# Patient Record
Sex: Female | Born: 1970 | Race: Black or African American | Hispanic: No | Smoking: Never smoker
Health system: Southern US, Community
[De-identification: ages and names within clinical notes are randomized; demographics above are authoritative.]

## PROBLEM LIST (undated history)

## (undated) DIAGNOSIS — E669 Obesity, unspecified: Secondary | ICD-10-CM

## (undated) DIAGNOSIS — D649 Anemia, unspecified: Secondary | ICD-10-CM

## (undated) DIAGNOSIS — I671 Cerebral aneurysm, nonruptured: Secondary | ICD-10-CM

## (undated) DIAGNOSIS — E119 Type 2 diabetes mellitus without complications: Secondary | ICD-10-CM

## (undated) DIAGNOSIS — R0600 Dyspnea, unspecified: Secondary | ICD-10-CM

## (undated) DIAGNOSIS — I10 Essential (primary) hypertension: Secondary | ICD-10-CM

## (undated) DIAGNOSIS — G459 Transient cerebral ischemic attack, unspecified: Secondary | ICD-10-CM

## (undated) HISTORY — PX: FOOT SURGERY: SHX648

## (undated) HISTORY — DX: Anemia, unspecified: D64.9

## (undated) HISTORY — PX: ABDOMINAL HYSTERECTOMY: SHX81

## (undated) HISTORY — PX: TUBAL LIGATION: SHX77

---

## 2009-02-15 ENCOUNTER — Emergency Department (HOSPITAL_COMMUNITY): Admission: EM | Admit: 2009-02-15 | Discharge: 2009-02-15 | Payer: Self-pay | Admitting: Emergency Medicine

## 2009-03-15 ENCOUNTER — Emergency Department (HOSPITAL_COMMUNITY): Admission: EM | Admit: 2009-03-15 | Discharge: 2009-03-15 | Payer: Self-pay | Admitting: Emergency Medicine

## 2009-05-19 ENCOUNTER — Emergency Department (HOSPITAL_COMMUNITY): Admission: EM | Admit: 2009-05-19 | Discharge: 2009-05-19 | Payer: Self-pay | Admitting: Emergency Medicine

## 2009-09-17 ENCOUNTER — Emergency Department (HOSPITAL_COMMUNITY): Admission: EM | Admit: 2009-09-17 | Discharge: 2009-09-18 | Payer: Self-pay | Admitting: Emergency Medicine

## 2010-01-24 ENCOUNTER — Emergency Department (HOSPITAL_COMMUNITY): Admission: EM | Admit: 2010-01-24 | Discharge: 2010-01-24 | Payer: Self-pay | Admitting: Emergency Medicine

## 2010-06-25 ENCOUNTER — Emergency Department (HOSPITAL_COMMUNITY): Admission: EM | Admit: 2010-06-25 | Discharge: 2010-06-25 | Payer: Self-pay | Admitting: Emergency Medicine

## 2010-08-23 ENCOUNTER — Ambulatory Visit (HOSPITAL_BASED_OUTPATIENT_CLINIC_OR_DEPARTMENT_OTHER): Admission: RE | Admit: 2010-08-23 | Discharge: 2010-08-24 | Payer: Self-pay | Admitting: Specialist

## 2011-02-17 LAB — DIFFERENTIAL
Basophils Absolute: 0 10*3/uL (ref 0.0–0.1)
Eosinophils Relative: 6 % — ABNORMAL HIGH (ref 0–5)
Lymphocytes Relative: 36 % (ref 12–46)
Lymphs Abs: 1.8 10*3/uL (ref 0.7–4.0)
Monocytes Relative: 10 % (ref 3–12)
Neutro Abs: 2.5 10*3/uL (ref 1.7–7.7)

## 2011-02-17 LAB — BASIC METABOLIC PANEL
BUN: 6 mg/dL (ref 6–23)
Chloride: 108 mEq/L (ref 96–112)
GFR calc Af Amer: >60 mL/min (ref >60)
GFR calc non Af Amer: >60 mL/min (ref >60)
Potassium: 3.6 mEq/L (ref 3.5–5.1)
Sodium: 140 mEq/L (ref 135–145)

## 2011-02-17 LAB — CBC
HCT: 32.6 % — ABNORMAL LOW (ref 36.0–46.0)
Hemoglobin: 10.2 g/dL — ABNORMAL LOW (ref 12.0–15.0)
MCV: 70.1 fL — ABNORMAL LOW (ref 78.0–100.0)
RBC: 4.65 MIL/uL (ref 3.87–5.11)
RDW: 14.8 % (ref 11.5–15.5)
WBC: 5.1 10*3/uL (ref 4.0–10.5)

## 2011-03-10 LAB — URINALYSIS, ROUTINE W REFLEX MICROSCOPIC
Glucose, UA: NEGATIVE mg/dL
Protein, ur: NEGATIVE mg/dL
Specific Gravity, Urine: 1.026 (ref 1.005–1.030)
Urobilinogen, UA: 1 mg/dL (ref 0.0–1.0)

## 2011-03-10 LAB — POCT I-STAT, CHEM 8
BUN: 10 mg/dL (ref 6–23)
Calcium, Ion: 0.96 mmol/L — ABNORMAL LOW (ref 1.12–1.32)
Chloride: 108 mEq/L (ref 96–112)
Glucose, Bld: 106 mg/dL — ABNORMAL HIGH (ref 70–99)
TCO2: 24 mmol/L (ref 0–100)

## 2011-03-10 LAB — URINE MICROSCOPIC-ADD ON

## 2011-03-17 LAB — POCT I-STAT, CHEM 8
BUN: 6 mg/dL (ref 6–23)
Calcium, Ion: 1.17 mmol/L (ref 1.12–1.32)
HCT: 38 % (ref 36.0–46.0)
Hemoglobin: 12.9 g/dL (ref 12.0–15.0)
TCO2: 26 mmol/L (ref 0–100)

## 2011-03-17 LAB — URINALYSIS, ROUTINE W REFLEX MICROSCOPIC
Glucose, UA: NEGATIVE mg/dL
Ketones, ur: NEGATIVE mg/dL
Protein, ur: NEGATIVE mg/dL

## 2011-03-17 LAB — DIFFERENTIAL
Basophils Absolute: 0 10*3/uL (ref 0.0–0.1)
Basophils Relative: 1 % (ref 0–1)
Eosinophils Absolute: 0.3 10*3/uL (ref 0.0–0.7)
Eosinophils Relative: 4 % (ref 0–5)
Monocytes Absolute: 0.6 10*3/uL (ref 0.1–1.0)

## 2011-03-17 LAB — CBC
HCT: 35 % — ABNORMAL LOW (ref 36.0–46.0)
Hemoglobin: 11.3 g/dL — ABNORMAL LOW (ref 12.0–15.0)
MCHC: 32.2 g/dL (ref 30.0–36.0)
RDW: 15 % (ref 11.5–15.5)

## 2011-08-06 HISTORY — PX: BREAST SURGERY: SHX581

## 2012-02-08 ENCOUNTER — Emergency Department (HOSPITAL_COMMUNITY)
Admission: EM | Admit: 2012-02-08 | Discharge: 2012-02-09 | Disposition: A | Discharge status: Home / Self Care | Payer: PRIVATE HEALTH INSURANCE | Attending: Emergency Medicine | Admitting: Emergency Medicine

## 2012-02-08 ENCOUNTER — Emergency Department (HOSPITAL_COMMUNITY): Payer: PRIVATE HEALTH INSURANCE

## 2012-02-08 ENCOUNTER — Encounter (HOSPITAL_COMMUNITY): Payer: Self-pay | Admitting: Emergency Medicine

## 2012-02-08 DIAGNOSIS — J3489 Other specified disorders of nose and nasal sinuses: Secondary | ICD-10-CM | POA: Insufficient documentation

## 2012-02-08 DIAGNOSIS — R059 Cough, unspecified: Secondary | ICD-10-CM | POA: Insufficient documentation

## 2012-02-08 DIAGNOSIS — IMO0001 Reserved for inherently not codable concepts without codable children: Secondary | ICD-10-CM | POA: Insufficient documentation

## 2012-02-08 DIAGNOSIS — M2550 Pain in unspecified joint: Secondary | ICD-10-CM | POA: Insufficient documentation

## 2012-02-08 DIAGNOSIS — R5381 Other malaise: Secondary | ICD-10-CM | POA: Insufficient documentation

## 2012-02-08 DIAGNOSIS — R630 Anorexia: Secondary | ICD-10-CM | POA: Insufficient documentation

## 2012-02-08 DIAGNOSIS — R599 Enlarged lymph nodes, unspecified: Secondary | ICD-10-CM | POA: Insufficient documentation

## 2012-02-08 DIAGNOSIS — I10 Essential (primary) hypertension: Secondary | ICD-10-CM | POA: Insufficient documentation

## 2012-02-08 DIAGNOSIS — R509 Fever, unspecified: Secondary | ICD-10-CM | POA: Insufficient documentation

## 2012-02-08 DIAGNOSIS — R05 Cough: Secondary | ICD-10-CM | POA: Insufficient documentation

## 2012-02-08 DIAGNOSIS — R070 Pain in throat: Secondary | ICD-10-CM | POA: Insufficient documentation

## 2012-02-08 DIAGNOSIS — R109 Unspecified abdominal pain: Secondary | ICD-10-CM | POA: Insufficient documentation

## 2012-02-08 DIAGNOSIS — J111 Influenza due to unidentified influenza virus with other respiratory manifestations: Secondary | ICD-10-CM | POA: Insufficient documentation

## 2012-02-08 HISTORY — DX: Essential (primary) hypertension: I10

## 2012-02-08 LAB — MONONUCLEOSIS SCREEN: Mono Screen: NEGATIVE

## 2012-02-08 LAB — DIFFERENTIAL
Basophils Relative: 0 % (ref 0–1)
Eosinophils Relative: 1 % (ref 0–5)
Lymphs Abs: 0.9 10*3/uL (ref 0.7–4.0)
Monocytes Relative: 17 % — ABNORMAL HIGH (ref 3–12)
Neutro Abs: 2.1 10*3/uL (ref 1.7–7.7)

## 2012-02-08 LAB — URINALYSIS, ROUTINE W REFLEX MICROSCOPIC
Bilirubin Urine: NEGATIVE
Hgb urine dipstick: NEGATIVE
Specific Gravity, Urine: 1.015 (ref 1.005–1.030)
Urobilinogen, UA: 1 mg/dL (ref 0.0–1.0)

## 2012-02-08 LAB — CBC
HCT: 28.1 % — ABNORMAL LOW (ref 36.0–46.0)
MCV: 64 fL — ABNORMAL LOW (ref 78.0–100.0)
RBC: 4.39 MIL/uL (ref 3.87–5.11)
RDW: 17.1 % — ABNORMAL HIGH (ref 11.5–15.5)
WBC: 3.6 10*3/uL — ABNORMAL LOW (ref 4.0–10.5)

## 2012-02-08 LAB — BASIC METABOLIC PANEL
CO2: 24 mEq/L (ref 19–32)
Chloride: 105 mEq/L (ref 96–112)
Glucose, Bld: 103 mg/dL — ABNORMAL HIGH (ref 70–99)
Potassium: 3.2 mEq/L — ABNORMAL LOW (ref 3.5–5.1)
Sodium: 138 mEq/L (ref 135–145)

## 2012-02-08 MED ORDER — SODIUM CHLORIDE 0.9 % IV BOLUS (SEPSIS)
1000.0000 mL | Freq: Once | INTRAVENOUS | Status: AC
  Administered 2012-02-08: 1000 mL via INTRAVENOUS

## 2012-02-08 MED ORDER — ONDANSETRON 4 MG PO TBDP
4.0000 mg | ORAL_TABLET | Freq: Once | ORAL | Status: AC
  Administered 2012-02-08: 4 mg via ORAL
  Filled 2012-02-08: qty 1

## 2012-02-08 MED ORDER — OSELTAMIVIR PHOSPHATE 75 MG PO CAPS: 75.0000 mg | ORAL_CAPSULE | Freq: Two times a day (BID) | ORAL | Status: AC

## 2012-02-08 MED ORDER — ONDANSETRON 4 MG PO TBDP
ORAL_TABLET | ORAL | Status: AC
  Administered 2012-02-08: 8 mg via ORAL
  Filled 2012-02-08: qty 2

## 2012-02-08 MED ORDER — ALBUTEROL SULFATE HFA 108 (90 BASE) MCG/ACT IN AERS
2.0000 | INHALATION_SPRAY | RESPIRATORY_TRACT | Status: DC | PRN
  Administered 2012-02-08: 2 via RESPIRATORY_TRACT
  Filled 2012-02-08: qty 6.7

## 2012-02-08 MED ORDER — HYDROMORPHONE HCL PF 1 MG/ML IJ SOLN
1.0000 mg | Freq: Once | INTRAMUSCULAR | Status: AC
  Administered 2012-02-08: 1 mg via INTRAVENOUS
  Filled 2012-02-08: qty 1

## 2012-02-08 MED ORDER — PROMETHAZINE HCL 25 MG/ML IJ SOLN
25.0000 mg | INTRAMUSCULAR | Status: AC
  Administered 2012-02-09: 25 mg via INTRAMUSCULAR
  Filled 2012-02-08: qty 1

## 2012-02-08 MED ORDER — ONDANSETRON 4 MG PO TBDP
8.0000 mg | ORAL_TABLET | Freq: Once | ORAL | Status: AC
  Administered 2012-02-08: 8 mg via ORAL

## 2012-02-08 MED ORDER — ONDANSETRON HCL 4 MG/2ML IJ SOLN
4.0000 mg | Freq: Once | INTRAMUSCULAR | Status: AC
  Administered 2012-02-08: 4 mg via INTRAVENOUS
  Filled 2012-02-08: qty 2

## 2012-02-08 MED ORDER — OXYCODONE-ACETAMINOPHEN 5-325 MG PO TABS: 1.0000 | ORAL_TABLET | ORAL | Status: AC | PRN

## 2012-02-08 MED ORDER — OXYCODONE-ACETAMINOPHEN 5-325 MG PO TABS
2.0000 | ORAL_TABLET | Freq: Once | ORAL | Status: AC
  Administered 2012-02-08: 2 via ORAL
  Filled 2012-02-08: qty 2

## 2012-02-08 NOTE — ED Notes (Signed)
Patient states onset two days ago nasal and chest congestion light green sputum with general body achy and left lower back pain. Pain currently 10/10 achy.  Airway intact bilateral equal chest rise and fall.  Ax4 family member at bedside.

## 2012-02-08 NOTE — ED Notes (Signed)
Patient continues to c/o nausea and feeling sick

## 2012-02-08 NOTE — ED Provider Notes (Signed)
History     CSN: [id number]  Arrival date & time 02/08/12  1654   First MD Initiated Contact with Patient 02/08/12 1810      Chief Complaint  Patient presents with  . Flank Pain    (Consider location/radiation/quality/duration/timing/severity/associated sxs/prior treatment) HPI Comments: Patient here with a two day history starting with left flank pain - states that this has continued but she now has fever, chills, nasal congestion, cough with green sputum production, body aches, sore throat.  Denies abdominal pain but reports decrease in appetite.  Patient is a 41 y.o. female presenting with flank pain. The history is provided by the patient. No language interpreter was used.  Flank Pain This is a new problem. The current episode started in the past 7 days. The problem occurs constantly. The problem has been unchanged. Associated symptoms include anorexia, arthralgias, chills, congestion, coughing, fatigue, a fever, myalgias and swollen glands. Pertinent negatives include no abdominal pain, change in bowel habit, chest pain, diaphoresis, headaches, joint swelling, nausea, neck pain, numbness, rash, sore throat, urinary symptoms, vertigo, visual change, vomiting or weakness. The symptoms are aggravated by nothing. She has tried nothing for the symptoms. The treatment provided no relief.    Past Medical History  Diagnosis Date  . Hypertension     Past Surgical History  Procedure Date  . Foot surgery     History reviewed. No pertinent family history.  History  Substance Use Topics  . Smoking status: Never Smoker   . Smokeless tobacco: Not on file  . Alcohol Use: No    OB History    Grav Para Term Preterm Abortions TAB SAB Ect Mult Living                  Review of Systems  Constitutional: Positive for fever, chills and fatigue. Negative for diaphoresis.  HENT: Positive for congestion. Negative for sore throat and neck pain.   Respiratory: Positive for cough.     Cardiovascular: Negative for chest pain.  Gastrointestinal: Positive for anorexia. Negative for nausea, vomiting, abdominal pain and change in bowel habit.  Genitourinary: Positive for flank pain.  Musculoskeletal: Positive for myalgias and arthralgias. Negative for joint swelling.  Skin: Negative for rash.  Neurological: Negative for vertigo, weakness, numbness and headaches.  All other systems reviewed and are negative.    Allergies  Review of patient's allergies indicates no known allergies.  Home Medications  No current outpatient prescriptions on file.  BP 133/83  Pulse 82  Temp(Src) 99.7 F (37.6 C) (Oral)  Resp 18  SpO2 97%  LMP 02/01/2012  Physical Exam  Nursing note and vitals reviewed. Constitutional: She is oriented to person, place, and time. She appears well-developed and well-nourished. She appears distressed.  HENT:  Head: Normocephalic and atraumatic.  Right Ear: External ear normal.  Left Ear: External ear normal.  Mouth/Throat: No oropharyngeal exudate.       Nasal congestion and boggy membranes  Eyes: Conjunctivae are normal. Pupils are equal, round, and reactive to light. No scleral icterus.  Neck: Normal range of motion. Neck supple.  Cardiovascular: Normal rate, regular rhythm and normal heart sounds.  Exam reveals no gallop and no friction rub.   No murmur heard. Pulmonary/Chest: Effort normal and breath sounds normal. No respiratory distress. She has no wheezes. She has no rales. She exhibits no tenderness.  Abdominal: Soft. Bowel sounds are normal. She exhibits no distension and no mass. There is no tenderness. There is no rebound, no guarding and no  CVA tenderness.  Musculoskeletal: Normal range of motion. She exhibits no edema and no tenderness.  Lymphadenopathy:    She has no cervical adenopathy.  Neurological: She is alert and oriented to person, place, and time. No cranial nerve deficit.  Skin: Skin is warm and dry. No rash noted. No  erythema. No pallor.  Psychiatric: She has a normal mood and affect. Her behavior is normal. Judgment and thought content normal.    ED Course  Procedures (including critical care time)  Labs Reviewed  CBC - Abnormal; Notable for the following:    WBC 3.6 (*)    Hemoglobin 8.7 (*)    HCT 28.1 (*)    MCV 64.0 (*)    MCH 19.8 (*)    RDW 17.1 (*)    All other components within normal limits  DIFFERENTIAL - Abnormal; Notable for the following:    Monocytes Relative 17 (*)    All other components within normal limits  BASIC METABOLIC PANEL - Abnormal; Notable for the following:    Potassium 3.2 (*)    Glucose, Bld 103 (*)    All other components within normal limits  URINALYSIS, ROUTINE W REFLEX MICROSCOPIC  POCT PREGNANCY, URINE   Dg Chest 2 View  02/08/2012  *RADIOLOGY REPORT*  Clinical Data: Cough, chest congestion, and left-sided chest pain.  CHEST - 2 VIEW  Comparison: 03/15/2009  Findings: Heart size and vascularity are normal and the lungs are clear of infiltrates and effusions.  Slight peribronchial thickening seen on the lateral view consistent with bronchitis.  No acute osseous abnormality.  IMPRESSION: Mild bronchitic changes.  Original Report Authenticated By: Gwynn Burly, M.D.     Influenza    MDM  Patient here with influenza like symptoms that started about 2 days ago - reports fever, chills, cough, chest congestion.  No bacterial appearance to this.        Izola Price [place], [place] 02/08/12 2146

## 2012-02-08 NOTE — ED Notes (Signed)
Patient c/o feeling sick and nauseated.

## 2012-02-08 NOTE — ED Notes (Addendum)
Patient complaining of left flank pain, chest congestion, hoarse voice, sore throat, body aches, cold chills, and fever since Sunday.  Patient denies past history of kidney stones; patient reports urinary frequency and hematuria.

## 2012-02-08 NOTE — Discharge Instructions (Signed)
Influenza, Adult Influenza ("the flu") is a viral infection of the respiratory tract. It causes chills, fever, cough, headache, body aches, and sore throat. Influenza in general will make you feel sicker than when you have a common cold. Symptoms of the illness typically last a few days. Cough and fatigue may continue for as long as 7 to 10 days. Influenza is highly contagious. It spreads easily to others in the droplets from coughs and sneezes. People frequently become infected by touching something that was recently contaminated with the virus and then touch their mouth, nose or eyes. This infection is caused by a virus. Symptoms will not be reduced or improved by taking an antibiotic. Antibiotics are medications that kill bacteria, not viruses. DIAGNOSIS  Diagnosis of influenza is often made based on the history and physical examination as well as the presence of influenza reports occurring in your community. Testing can be done if the diagnosis is not certain. TREATMENT  Since influenza is caused by a virus, antibiotics are not helpful. Your caregiver may prescribe antiviral medicines to shorten the illness and lessen the severity. Your caregiver may also recommend influenza vaccination and/or antiviral medicines for your family members in order to prevent the spread of influenza to them. HOME CARE INSTRUCTIONS  DO NOT GIVE ASPIRIN TO PERSONS WITH INFLUENZA WHO ARE UNDER 45 YEARS OF AGE. This could lead to brain and liver damage (Reye's syndrome). Read the label on over-the-counter medicines.   Stay home from work or school if at all possible until most of your symptoms are gone.   Only take over-the-counter or prescription medicines for pain, discomfort, or fever as directed by your caregiver.   Use a cool mist humidifier to increase air moisture. This will make breathing easier.   Rest until your temperature is nearly normal: 98.6 F (37 C). This usually takes 3 to 4 days. Be sure you get  plenty of rest.   Drink at least eight, eight-ounce glasses of fluids per day. Fluids include water, juice, broth, gelatin, or lemonade.   Cover your mouth and nose when coughing or sneezing and wash your hands often to prevent the spread of this virus to other persons.  PREVENTION  Annual influenza vaccination (flu shots) is the best way to avoid getting influenza. An annual flu shot is now routinely recommended for all adults in the [place] SEEK MEDICAL CARE IF:   You develop shortness of breath while resting.   You have a deep cough with production of mucous or chest pain.   You develop nausea (feeling sick to your stomach), vomiting, or diarrhea.  SEEK IMMEDIATE MEDICAL CARE IF:   You have difficulty breathing, become short of breath, or your skin or nails turn bluish.   You develop severe neck pain or stiffness.   You develop a severe headache, facial pain, or earache.   You have a fever.   You develop nausea or vomiting that cannot be controlled.  Document Released: 11/18/2000 Document Revised: 11/10/2011 Document Reviewed: 09/23/2009 [facility] Patient Information 2012 [place], [place].Influenza Facts Flu (influenza) is a contagious respiratory illness caused by the influenza viruses. It can cause mild to severe illness. While most healthy people recover from the flu without specific treatment and without complications, older people, young children, and people with certain health conditions are at higher risk for serious complications from the flu, including death. CAUSES   The flu virus is spread from person to person by respiratory droplets from coughing and sneezing.   A person  can also become infected by touching an object or surface with a virus on it and then touching their mouth, eye or nose.   Adults may be able to infect others from 1 day before symptoms occur and up to 7 days after getting sick. So it is possible to give someone the flu even before you know you are sick  and continue to infect others while you are sick.  SYMPTOMS   Fever (usually high).   Headache.   Tiredness (can be extreme).   Cough.   Sore throat.   Runny or stuffy nose.   Body aches.   Diarrhea and vomiting may also occur, particularly in children.   These symptoms are referred to as "flu-like symptoms". A lot of different illnesses, including the common cold, can have similar symptoms.  DIAGNOSIS   There are tests that can determine if you have the flu as long you are tested within the first 2 or 3 days of illness.   A doctor's exam and additional tests may be needed to identify if you have a disease that is a complicating the flu.  RISKS AND COMPLICATIONS  Some of the complications caused by the flu include:  Bacterial pneumonia or progressive pneumonia caused by the flu virus.   Loss of body fluids (dehydration).   Worsening of chronic medical conditions, such as heart failure, asthma, or diabetes.   Sinus problems and ear infections.  HOME CARE INSTRUCTIONS   Seek medical care early on.   If you are at high risk from complications of the flu, consult your health-care provider as soon as you develop flu-like symptoms. Those at high risk for complications include:   People 65 years or older.   People with chronic medical conditions, including diabetes.   Pregnant women.   Young children.   Your caregiver may recommend use of an antiviral medication to help treat the flu.   If you get the flu, get plenty of rest, drink a lot of liquids, and avoid using alcohol and tobacco.   You can take over-the-counter medications to relieve the symptoms of the flu if your caregiver approves. (Never give aspirin to children or teenagers who have flu-like symptoms, particularly fever).  PREVENTION  The single best way to prevent the flu is to get a flu vaccine each fall. Other measures that can help protect against the flu are:  Antiviral Medications   A number of  antiviral drugs are approved for use in preventing the flu. These are prescription medications, and a doctor should be consulted before they are used.   Habits for Good Health   Cover your nose and mouth with a tissue when you cough or sneeze, throw the tissue away after you use it.   Wash your hands often with soap and water, especially after you cough or sneeze. If you are not near water, use an alcohol-based hand cleaner.   Avoid people who are sick.   If you get the flu, stay home from work or school. Avoid contact with other people so that you do not make them sick, too.   Try not to touch your eyes, nose, or mouth as germs ore often spread this way.  IN CHILDREN, EMERGENCY WARNING SIGNS THAT NEED URGENT MEDICAL ATTENTION:  Fast breathing or trouble breathing.   Bluish skin color.   Not drinking enough fluids.   Not waking up or not interacting.   Being so irritable that the child does not want to be held.  Flu-like symptoms improve but then return with fever and worse cough.   Fever with a rash.  IN ADULTS, EMERGENCY WARNING SIGNS THAT NEED URGENT MEDICAL ATTENTION:  Difficulty breathing or shortness of breath.   Pain or pressure in the chest or abdomen.   Sudden dizziness.   Confusion.   Severe or persistent vomiting.  SEEK IMMEDIATE MEDICAL CARE IF:  You or someone you know is experiencing any of the symptoms above. When you arrive at the emergency center,report that you think you have the flu. You may be asked to wear a mask and/or sit in a secluded area to protect others from getting sick. MAKE SURE YOU:   Understand these instructions.   Monitor your condition.   Seek medical care if you are getting worse, or not improving.  Document Released: 11/24/2003 Document Revised: 11/10/2011 Document Reviewed: 08/20/2009 [facility] Patient Information 2012 [place], [place].

## 2012-02-08 NOTE — ED Notes (Signed)
MD at bedside. 

## 2012-02-09 NOTE — ED Provider Notes (Signed)
Medical screening examination/treatment/procedure(s) were performed by non-physician practitioner and as supervising physician I was immediately available for consultation/collaboration.   Laray Anger, DO 02/09/12 1338

## 2012-07-03 ENCOUNTER — Emergency Department (HOSPITAL_COMMUNITY): Payer: PRIVATE HEALTH INSURANCE

## 2012-07-03 ENCOUNTER — Emergency Department (HOSPITAL_COMMUNITY)
Admission: EM | Admit: 2012-07-03 | Discharge: 2012-07-03 | Disposition: A | Discharge status: Home / Self Care | Payer: PRIVATE HEALTH INSURANCE | Attending: Emergency Medicine | Admitting: Emergency Medicine

## 2012-07-03 ENCOUNTER — Encounter (HOSPITAL_COMMUNITY): Payer: Self-pay | Admitting: Emergency Medicine

## 2012-07-03 DIAGNOSIS — R05 Cough: Secondary | ICD-10-CM | POA: Insufficient documentation

## 2012-07-03 DIAGNOSIS — J40 Bronchitis, not specified as acute or chronic: Secondary | ICD-10-CM | POA: Insufficient documentation

## 2012-07-03 DIAGNOSIS — R059 Cough, unspecified: Secondary | ICD-10-CM | POA: Insufficient documentation

## 2012-07-03 DIAGNOSIS — R0602 Shortness of breath: Secondary | ICD-10-CM | POA: Insufficient documentation

## 2012-07-03 MED ORDER — PREDNISONE 20 MG PO TABS
40.0000 mg | ORAL_TABLET | Freq: Once | ORAL | Status: AC
  Administered 2012-07-03: 40 mg via ORAL
  Filled 2012-07-03: qty 2

## 2012-07-03 MED ORDER — GUAIFENESIN-CODEINE 100-10 MG/5ML PO SOLN
5.0000 mL | Freq: Once | ORAL | Status: AC
  Administered 2012-07-03: 5 mL via ORAL
  Filled 2012-07-03: qty 5

## 2012-07-03 MED ORDER — PREDNISONE 20 MG PO TABS: 40.0000 mg | ORAL_TABLET | Freq: Every day | ORAL | Status: AC

## 2012-07-03 MED ORDER — GUAIFENESIN-CODEINE 100-10 MG/5ML PO SYRP: 5.0000 mL | ORAL_SOLUTION | Freq: Three times a day (TID) | ORAL | Status: AC | PRN

## 2012-07-03 NOTE — ED Provider Notes (Signed)
History    This chart was scribed for Raeford Razor, MD, MD by Smitty Pluck. The patient was seen in room [facility] and the patient's care was started at 5:24PM.   CSN: [id number]  Arrival date & time 07/03/12  1659   First MD Initiated Contact with Patient 07/03/12 1712      Chief Complaint  Patient presents with  . URI  . Cough    (Consider location/radiation/quality/duration/timing/severity/associated sxs/prior treatment) Patient is a 41 y.o. female presenting with URI and cough. The history is provided by the patient.  URI The primary symptoms include cough.  Cough   Grace Gallagher is a 41 y.o. female who presents to the Emergency Department complaining of moderate URI onset 3 weeks ago. Pt reports having chills, productive cough with clear sputum. She reports chest congestion and chest pain associated with cough. Denies smoking cigarettes. Pt denies sick contact. Pt reports taking Robitussin without relief. Symptoms have been constant. Denies radiation. Denies headaches, fever, nausea and emesis. Denies hx of blood clots and leg pain and swelling.   Past Medical History  Diagnosis Date  . Hypertension     Past Surgical History  Procedure Date  . Foot surgery     History reviewed. No pertinent family history.  History  Substance Use Topics  . Smoking status: Never Smoker   . Smokeless tobacco: Not on file  . Alcohol Use: No    OB History    Grav Para Term Preterm Abortions TAB SAB Ect Mult Living                  Review of Systems  Respiratory: Positive for cough.   All other systems reviewed and are negative.   10 Systems reviewed and all are negative for acute change except as noted in the HPI.   Allergies  Review of patient's allergies indicates no known allergies.  Home Medications  No current outpatient prescriptions on file.  BP 147/87  Pulse 87  Temp 98.9 F (37.2 C) (Oral)  Resp 20  SpO2 97%  Physical Exam  Nursing note and vitals  reviewed. Constitutional: She is oriented to person, place, and time. She appears well-developed and well-nourished. No distress.  HENT:  Head: Normocephalic and atraumatic.  Eyes: Conjunctivae are normal.  Neck: Normal range of motion. Neck supple.  Cardiovascular: Normal rate, regular rhythm and normal heart sounds.   No murmur heard. Pulmonary/Chest: Effort normal and breath sounds normal. No respiratory distress. She has no wheezes. She has no rales.  Musculoskeletal: She exhibits edema (mild lower extremity).       Lower extremities were symmetric  No palpable chords   Neurological: She is alert and oriented to person, place, and time.  Skin: Skin is warm and dry.  Psychiatric: She has a normal mood and affect. Her behavior is normal.    ED Course  Procedures (including critical care time) DIAGNOSTIC STUDIES: Oxygen Saturation is 97% on room air, normal by my interpretation.    COORDINATION OF CARE: 5:27PM EDP discusses pt ED treatment with pt     Labs Reviewed - No data to display Dg Chest 2 View  07/03/2012  *RADIOLOGY REPORT*  Clinical Data: Cough and SOB  CHEST - 2 VIEW  Comparison: 02/08/2012  Findings: The heart size and mediastinal contours are within normal limits.  Both lungs are clear.  The visualized skeletal structures are unremarkable.  IMPRESSION: Negative exam.  Original Report Authenticated By: Rosealee Albee, M.D.  1. Bronchitis       MDM  40yF with cough. Clinically bronchitis. CXR without focal infiltrate. No respiratory distress on exam. O2 sats good on RA. Plan symptomatic tx. Return precautions discussed. outpt fu.   I personally preformed the services scribed in my presence. The recorded information has been reviewed and considered. Raeford Razor, MD.      Raeford Razor, MD 07/12/12 [phone]

## 2012-07-03 NOTE — ED Notes (Signed)
Pt c/o cough and head congestion x 3 weeks with clear sputum; pt sts became more severe today; pt denies fever

## 2012-10-07 ENCOUNTER — Encounter (HOSPITAL_COMMUNITY): Payer: Self-pay | Admitting: Emergency Medicine

## 2012-10-07 ENCOUNTER — Emergency Department (HOSPITAL_COMMUNITY)
Admission: EM | Admit: 2012-10-07 | Discharge: 2012-10-07 | Disposition: A | Discharge status: Home / Self Care | Payer: Commercial Managed Care - PPO | Attending: Emergency Medicine | Admitting: Emergency Medicine

## 2012-10-07 DIAGNOSIS — K047 Periapical abscess without sinus: Secondary | ICD-10-CM

## 2012-10-07 DIAGNOSIS — I10 Essential (primary) hypertension: Secondary | ICD-10-CM | POA: Insufficient documentation

## 2012-10-07 MED ORDER — CLINDAMYCIN HCL 300 MG PO CAPS: 300.0000 mg | ORAL_CAPSULE | Freq: Four times a day (QID) | ORAL | Status: DC

## 2012-10-07 MED ORDER — CLINDAMYCIN HCL 300 MG PO CAPS
300.0000 mg | ORAL_CAPSULE | Freq: Once | ORAL | Status: AC
  Administered 2012-10-07: 300 mg via ORAL
  Filled 2012-10-07: qty 1

## 2012-10-07 MED ORDER — OXYCODONE-ACETAMINOPHEN 5-325 MG PO TABS: 2.0000 | ORAL_TABLET | ORAL | Status: DC | PRN

## 2012-10-07 MED ORDER — IBUPROFEN 800 MG PO TABS
800.0000 mg | ORAL_TABLET | Freq: Once | ORAL | Status: AC
  Administered 2012-10-07: 800 mg via ORAL
  Filled 2012-10-07: qty 1

## 2012-10-07 NOTE — ED Provider Notes (Signed)
History     CSN: [id number]  Arrival date & time 10/07/12  1018   First MD Initiated Contact with Patient 10/07/12 1023      Chief Complaint  Patient presents with  . Oral Swelling    (Consider location/radiation/quality/duration/timing/severity/associated sxs/prior treatment) The history is provided by the patient.  Grace Gallagher is a 41 y.o. female here with dental pain and left face swelling. She has intermittent left upper dental pain due to poor dentition. Since yesterday she notes that left side of her face was swollen denies any fevers or chills. She hasn't seen a dentist for a long time but she has insurance and will be able to see one.    Past Medical History  Diagnosis Date  . Hypertension     Past Surgical History  Procedure Date  . Foot surgery     History reviewed. No pertinent family history.  History  Substance Use Topics  . Smoking status: Never Smoker   . Smokeless tobacco: Not on file  . Alcohol Use: No    OB History    Grav Para Term Preterm Abortions TAB SAB Ect Mult Living                  Review of Systems  HENT: Positive for facial swelling and dental problem.   All other systems reviewed and are negative.    Allergies  Hydrocodone  Home Medications  No current outpatient prescriptions on file.  BP 154/97  Pulse 65  Temp 98.5 F (36.9 C) (Oral)  Resp 18  SpO2 99%  Physical Exam  Nursing note and vitals reviewed. Constitutional: She is oriented to person, place, and time. She appears well-developed and well-nourished.       Anxious, uncomfortable.   HENT:  Head: Normocephalic.  Mouth/Throat:         L face with obvious swelling. Mild tenderness over parotid gland. No obvious purulent discharge from the duct. No parotid stones palpable. There is one tooth (see diagram) that appeared to have a cavity. Mild swelling around the gingiva next to the tooth. No obvious abscess collection. OP otherwise unremarkable.   Eyes:  Conjunctivae normal are normal. Pupils are equal, round, and reactive to light.  Neck: Normal range of motion. Neck supple.  Cardiovascular: Normal rate.   Pulmonary/Chest: Effort normal.  Abdominal: Soft.  Musculoskeletal: Normal range of motion.  Neurological: She is alert and oriented to person, place, and time.  Skin: Skin is warm and dry.  Psychiatric: She has a normal mood and affect. Her behavior is normal. Judgment and thought content normal.    ED Course  Procedures (including critical care time)  Labs Reviewed - No data to display No results found.   No diagnosis found.    MDM  Grace Gallagher is a 41 y.o. female here with L sided dental pain and facial swelling. She might have an early apical abscess. Will also consider parotitis or parotid duct stone (even though there are no palpable stones). Will given clinda, pain meds, and have her follow up outpatient.          Richardean Canal, MD 10/07/12 1038

## 2012-10-07 NOTE — ED Notes (Signed)
Pt presents to ED today with c/o dental pain and swelling to left side of face. NAD.

## 2012-10-08 ENCOUNTER — Encounter (HOSPITAL_COMMUNITY): Payer: Self-pay | Admitting: *Deleted

## 2012-10-08 ENCOUNTER — Emergency Department (HOSPITAL_COMMUNITY)
Admission: EM | Admit: 2012-10-08 | Discharge: 2012-10-08 | Disposition: A | Discharge status: Home / Self Care | Payer: Commercial Managed Care - PPO | Attending: Emergency Medicine | Admitting: Emergency Medicine

## 2012-10-08 ENCOUNTER — Emergency Department (HOSPITAL_COMMUNITY): Payer: Commercial Managed Care - PPO

## 2012-10-08 DIAGNOSIS — K047 Periapical abscess without sinus: Secondary | ICD-10-CM

## 2012-10-08 DIAGNOSIS — R220 Localized swelling, mass and lump, head: Secondary | ICD-10-CM | POA: Insufficient documentation

## 2012-10-08 DIAGNOSIS — I10 Essential (primary) hypertension: Secondary | ICD-10-CM | POA: Insufficient documentation

## 2012-10-08 DIAGNOSIS — Z79899 Other long term (current) drug therapy: Secondary | ICD-10-CM | POA: Insufficient documentation

## 2012-10-08 LAB — CBC WITH DIFFERENTIAL/PLATELET
Basophils Absolute: 0 10*3/uL (ref 0.0–0.1)
Eosinophils Absolute: 0.2 10*3/uL (ref 0.0–0.7)
HCT: 30.6 % — ABNORMAL LOW (ref 36.0–46.0)
Lymphocytes Relative: 15 % (ref 12–46)
Lymphs Abs: 0.9 10*3/uL (ref 0.7–4.0)
MCHC: 31.7 g/dL (ref 30.0–36.0)
MCV: 68.2 fL — ABNORMAL LOW (ref 78.0–100.0)
Neutro Abs: 4.6 10*3/uL (ref 1.7–7.7)
RDW: 21.9 % — ABNORMAL HIGH (ref 11.5–15.5)

## 2012-10-08 LAB — COMPREHENSIVE METABOLIC PANEL
ALT: 11 U/L (ref 0–35)
AST: 18 U/L (ref 0–37)
Alkaline Phosphatase: 46 U/L (ref 39–117)
Calcium: 8.8 mg/dL (ref 8.4–10.5)
GFR calc Af Amer: >90 mL/min (ref >90)
GFR calc non Af Amer: >90 mL/min (ref >90)
Total Bilirubin: 0.7 mg/dL (ref 0.3–1.2)

## 2012-10-08 MED ORDER — HYDROMORPHONE HCL PF 1 MG/ML IJ SOLN
1.0000 mg | Freq: Once | INTRAMUSCULAR | Status: AC
  Administered 2012-10-08: 1 mg via INTRAVENOUS

## 2012-10-08 MED ORDER — PIPERACILLIN-TAZOBACTAM 3.375 G IVPB 30 MIN
3.3750 g | Freq: Once | INTRAVENOUS | Status: AC
  Administered 2012-10-08: 3.375 g via INTRAVENOUS
  Filled 2012-10-08: qty 50

## 2012-10-08 MED ORDER — MORPHINE SULFATE 4 MG/ML IJ SOLN
6.0000 mg | Freq: Once | INTRAMUSCULAR | Status: AC
  Administered 2012-10-08: 6 mg via INTRAVENOUS
  Filled 2012-10-08: qty 2

## 2012-10-08 MED ORDER — FAMOTIDINE IN NACL 20-0.9 MG/50ML-% IV SOLN
20.0000 mg | Freq: Once | INTRAVENOUS | Status: AC
  Administered 2012-10-08: 20 mg via INTRAVENOUS
  Filled 2012-10-08: qty 50

## 2012-10-08 MED ORDER — IOHEXOL 300 MG/ML  SOLN
100.0000 mL | Freq: Once | INTRAMUSCULAR | Status: AC | PRN
  Administered 2012-10-08: 100 mL via INTRAVENOUS

## 2012-10-08 MED ORDER — DIPHENHYDRAMINE HCL 50 MG/ML IJ SOLN
50.0000 mg | Freq: Once | INTRAMUSCULAR | Status: AC
  Administered 2012-10-08: 50 mg via INTRAVENOUS
  Filled 2012-10-08: qty 1

## 2012-10-08 NOTE — ED Notes (Signed)
Pt reports being seen at [facility] ED Sunday for facial edema, pt also c/o L upper toothache.  Was told swelling is d/t the toothache.  Advised to see her Dentist.  Pt came here from the Dentist was instructed to come to the ED for IV abx.  Pt also reports taking lisinopril.  Pt presents with L angioedema.  Pt also reports pain.

## 2012-10-08 NOTE — ED Provider Notes (Signed)
History     CSN: [id number]  Arrival date & time 10/08/12  7829   First MD Initiated Contact with Patient 10/08/12 [phone]      Chief Complaint  Patient presents with  . Facial Swelling  . Dental Pain    (Consider location/radiation/quality/duration/timing/severity/associated sxs/prior treatment) The history is provided by the patient.  Grace Gallagher is a 41 y.o. female history of hypertension, poor dentition here presenting with worsening facial swelling. She was seen by me yesterday and was given clinda and Percocet for pain. Today she went to see her dentist in the swelling was worse to she was sent in for IV antibiotics. The dentist didn't do any tooth extractions or x-rays. She is currently on lisinopril and has been taking it for years. Denies any throat closing or drooling.   Past Medical History  Diagnosis Date  . Hypertension     Past Surgical History  Procedure Date  . Foot surgery     No family history on file.  History  Substance Use Topics  . Smoking status: Never Smoker   . Smokeless tobacco: Not on file  . Alcohol Use: No    OB History    Grav Para Term Preterm Abortions TAB SAB Ect Mult Living                  Review of Systems  HENT: Positive for dental problem.        Facial swelling   All other systems reviewed and are negative.    Allergies  Hydrocodone  Home Medications   Current Outpatient Rx  Name  Route  Sig  Dispense  Refill  . CLINDAMYCIN HCL 300 MG PO CAPS   Oral   Take 300 mg by mouth 4 (four) times daily. X 7 days pt's on day 2 of therapy         . FERROUS SULFATE 325 (65 FE) MG PO TABS   Oral   Take 325 mg by mouth 2 (two) times daily.         Marland Kitchen LISINOPRIL 10 MG PO TABS   Oral   Take 10 mg by mouth daily.         . OXYCODONE-ACETAMINOPHEN 5-325 MG PO TABS   Oral   Take 2 tablets by mouth every 4 (four) hours as needed for pain.   10 tablet   0     BP 128/80  Pulse 65  Temp 98.7 F (37.1 C) (Oral)   Resp 17  SpO2 100%  LMP 10/05/2012  Physical Exam  Nursing note and vitals reviewed. Constitutional: She is oriented to person, place, and time.       Uncomfortable   HENT:  Head: Normocephalic.    Mouth/Throat:         L face impressively swollen, involving the L eyelid and L lip. Mild tenderness over the parotid gland. No parotid duct stone or purulent discharge from the duct. L upper molar with poor dentition noted but no obvious peri apical abscess.   Eyes: Conjunctivae normal are normal. Pupils are equal, round, and reactive to light.  Neck: Normal range of motion. Neck supple.  Cardiovascular: Normal rate, regular rhythm and normal heart sounds.   Pulmonary/Chest: Effort normal and breath sounds normal. No respiratory distress. She has no wheezes.  Abdominal: Soft. Bowel sounds are normal. She exhibits no distension. There is no tenderness. There is no rebound.  Musculoskeletal: Normal range of motion. She exhibits no edema.  Neurological: She  is alert and oriented to person, place, and time.  Skin: Skin is warm and dry.  Psychiatric: She has a normal mood and affect. Her behavior is normal. Judgment and thought content normal.    ED Course  Procedures (including critical care time)  Labs Reviewed  CBC WITH DIFFERENTIAL - Abnormal; Notable for the following:    Hemoglobin 9.7 (*)     HCT 30.6 (*)     MCV 68.2 (*)     MCH 21.6 (*)     RDW 21.9 (*)     All other components within normal limits  COMPREHENSIVE METABOLIC PANEL - Abnormal; Notable for the following:    Albumin 3.4 (*)     All other components within normal limits   Ct Maxillofacial W/cm  10/08/2012  *RADIOLOGY REPORT*  Clinical Data: Left-sided facial swelling  CT MAXILLOFACIAL WITH CONTRAST  Technique:  Multidetector CT imaging of the maxillofacial structures was performed with intravenous contrast. Multiplanar CT image reconstructions were also generated.  Contrast: OMNIPAQUE IOHEXOL 300 MG/ML  SOLN   Comparison: None.  Findings: There is pronounced diffuse soft tissue swelling of the left side of the face consistent with diffuse cellulitis. Interestingly and, I think, incidentally, there is a lipoma within the soft tissues of the left cheek measuring approximately 2.5 cm in size.  This extends anterior to the masseter muscle.    No intracranial abnormality.  No fluid in the middle ears or mastoids. There is some mucosal inflammation of the paranasal sinuses.  No intra orbital inflammatory change.  No sign of fracture.  There is extensive periodontal disease.  There appears to be a peri apical abscess with lateral cortical erosion of the left side of the maxilla at tooth number 13. There is an adjacent 6 x 13 mm abscess.  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  There are multiple other dental caries.  IMPRESSION: Left facial cellulitis.  Genesis probably relates to dental decay with peri apical abscess at tooth number 13, with lateral cortical erosion of the maxilla.  There is a small adjacent soft tissue abscess on the order of 6 x 13 mm.  Incidental depiction of a 2.5 cm lipoma of the left face.   Original Report Authenticated By: Paulina Fusi, M.D.      1. Tooth abscess       MDM  Grace Gallagher is a 41 y.o. female here with L face swelling. Ddx includes worsening infection, parotitis vs parotid duct stone, vs angioedema. Will get labs, CT face. Will give pain meds and reassess.   1:48 PM CT showed peri apical abscess at tooth 13 measuring 6x13 mm. She was given IV zosyn. I discussed with oral surgeon, Dr. Jeanice Lim, who wanted to see the patient today at the clinic. He will extract the tooth and drain the abscess. Patient has no airway compromise currently and is not drooling. Will send patient over to the clinic.         Richardean Canal, MD 10/08/12 [phone]

## 2012-11-22 ENCOUNTER — Encounter (HOSPITAL_COMMUNITY): Payer: Self-pay | Admitting: Emergency Medicine

## 2012-11-22 ENCOUNTER — Emergency Department (HOSPITAL_COMMUNITY)
Admission: EM | Admit: 2012-11-22 | Discharge: 2012-11-22 | Disposition: A | Discharge status: Home / Self Care | Payer: Commercial Managed Care - PPO | Attending: Emergency Medicine | Admitting: Emergency Medicine

## 2012-11-22 DIAGNOSIS — J3489 Other specified disorders of nose and nasal sinuses: Secondary | ICD-10-CM | POA: Insufficient documentation

## 2012-11-22 DIAGNOSIS — H538 Other visual disturbances: Secondary | ICD-10-CM | POA: Insufficient documentation

## 2012-11-22 DIAGNOSIS — G43909 Migraine, unspecified, not intractable, without status migrainosus: Secondary | ICD-10-CM | POA: Insufficient documentation

## 2012-11-22 DIAGNOSIS — Z79899 Other long term (current) drug therapy: Secondary | ICD-10-CM | POA: Insufficient documentation

## 2012-11-22 DIAGNOSIS — J329 Chronic sinusitis, unspecified: Secondary | ICD-10-CM

## 2012-11-22 MED ORDER — DIPHENHYDRAMINE HCL 50 MG/ML IJ SOLN
25.0000 mg | Freq: Once | INTRAMUSCULAR | Status: AC
  Administered 2012-11-22: 25 mg via INTRAVENOUS
  Filled 2012-11-22: qty 1

## 2012-11-22 MED ORDER — AZITHROMYCIN 250 MG PO TABS: 250.0000 mg | ORAL_TABLET | Freq: Every day | ORAL | Status: DC

## 2012-11-22 MED ORDER — SODIUM CHLORIDE 0.9 % IV BOLUS (SEPSIS)
1000.0000 mL | Freq: Once | INTRAVENOUS | Status: AC
  Administered 2012-11-22: 1000 mL via INTRAVENOUS

## 2012-11-22 MED ORDER — METOCLOPRAMIDE HCL 5 MG/ML IJ SOLN
10.0000 mg | Freq: Once | INTRAMUSCULAR | Status: AC
  Administered 2012-11-22: 10 mg via INTRAVENOUS
  Filled 2012-11-22: qty 2

## 2012-11-22 MED ORDER — KETOROLAC TROMETHAMINE 30 MG/ML IJ SOLN
30.0000 mg | Freq: Once | INTRAMUSCULAR | Status: AC
  Administered 2012-11-22: 30 mg via INTRAVENOUS
  Filled 2012-11-22: qty 1

## 2012-11-22 MED ORDER — OXYCODONE-ACETAMINOPHEN 5-325 MG PO TABS: 2.0000 | ORAL_TABLET | ORAL | Status: DC | PRN

## 2012-11-22 NOTE — ED Notes (Signed)
Patient reports headache and hypertension.  Patient denies N/V/D.  Patient reports dizziness, light headedness, and blurred vision.

## 2012-11-22 NOTE — ED Notes (Signed)
Pt c/o headache 10/10; pt sts no hx of migraines and sts she usually gets headache this strong if her b/p is really high.

## 2012-11-22 NOTE — ED Notes (Signed)
Pt. c/o headache. RN Kallie Locks and MD Rubin Payor notified

## 2012-11-22 NOTE — ED Provider Notes (Signed)
History     CSN: [id number]  Arrival date & time 11/22/12  1703   First MD Initiated Contact with Patient 11/22/12 1828      Chief Complaint  Patient presents with  . Hypertension  . Migraine    (Consider location/radiation/quality/duration/timing/severity/associated sxs/prior treatment) HPI Comments: Patient is a 41 year old female with a past medical history of hypertension who presents with a headache for 1 day. Patient reports a gradual onset and progressive worsening of the headache. The pain is sharp, constant and is located in generalized head without radiation. Patient has tried lisinopril for symptoms without relief. No alleviating/aggravating factors. Patient reports associated sinus congestion and pressure. Patient denies fever, vomiting, diarrhea, numbness/tingling, weakness, visual changes, congestion, chest pain, SOB, abdominal pain.     Patient is a 41 y.o. female presenting with hypertension and migraines.  Hypertension Associated symptoms include congestion and headaches.  Migraine Associated symptoms include congestion and headaches.    Past Medical History  Diagnosis Date  . Hypertension     Past Surgical History  Procedure Date  . Foot surgery     History reviewed. No pertinent family history.  History  Substance Use Topics  . Smoking status: Never Smoker   . Smokeless tobacco: Not on file  . Alcohol Use: No    OB History    Grav Para Term Preterm Abortions TAB SAB Ect Mult Living                  Review of Systems  HENT: Positive for congestion and sinus pressure.   Neurological: Positive for headaches.  All other systems reviewed and are negative.    Allergies  Hydrocodone  Home Medications   Current Outpatient Rx  Name  Route  Sig  Dispense  Refill  . FERROUS SULFATE 325 (65 FE) MG PO TABS   Oral   Take 325 mg by mouth 2 (two) times daily.         Marland Kitchen LISINOPRIL 10 MG PO TABS   Oral   Take 10 mg by mouth daily.          Marland Kitchen CLINDAMYCIN HCL 300 MG PO CAPS   Oral   Take 300 mg by mouth 4 (four) times daily. X 7 days pt's on day 2 of therapy         . OXYCODONE-ACETAMINOPHEN 5-325 MG PO TABS   Oral   Take 2 tablets by mouth every 4 (four) hours as needed for pain.   10 tablet   0     BP 144/89  Pulse 88  Temp 97.8 F (36.6 C)  Resp 20  SpO2 98%  LMP 11/18/2012  Physical Exam  Nursing note and vitals reviewed. Constitutional: She is oriented to person, place, and time. She appears well-developed and well-nourished. No distress.  HENT:  Head: Normocephalic and atraumatic.  Mouth/Throat: Oropharynx is clear and moist. No oropharyngeal exudate.       Maxillary tenderness to palpation. No frontal or ethmoid tenderness to palpation.   Eyes: Conjunctivae normal and EOM are normal. Pupils are equal, round, and reactive to light. No scleral icterus.  Neck: Normal range of motion.  Cardiovascular: Normal rate and regular rhythm.  Exam reveals no gallop and no friction rub.   No murmur heard. Pulmonary/Chest: Effort normal and breath sounds normal. She has no wheezes. She has no rales. She exhibits no tenderness.  Abdominal: Soft. She exhibits no distension. There is no tenderness. There is no rebound.  Musculoskeletal: Normal  range of motion.  Neurological: She is alert and oriented to person, place, and time. Coordination normal.       Speech is goal-oriented. Moves limbs without ataxia.   Skin: Skin is warm and dry.  Psychiatric: She has a normal mood and affect. Her behavior is normal.    ED Course  Procedures (including critical care time)  Labs Reviewed - No data to display No results found.   1. Sinusitis       MDM  6:59 PM Patient will have fluids, toradol, reglan, and benadryl.   7:55 PM Patient reports feeling better. I will discharge her with azithromycin for sinusitis and percocet for headache. Patient is agreeable to plan. No further evaluation needed at this time. Patient  is afebrile and non toxic.      Emilia Beck, [place] 11/23/12 1933

## 2012-11-26 NOTE — ED Provider Notes (Signed)
Medical screening examination/treatment/procedure(s) were performed by non-physician practitioner and as supervising physician I was immediately available for consultation/collaboration.  Hasani Diemer R. Kerry Chisolm, MD 11/26/12 1024 

## 2013-02-21 ENCOUNTER — Encounter: Payer: Self-pay | Admitting: Family Medicine

## 2013-02-21 ENCOUNTER — Ambulatory Visit (INDEPENDENT_AMBULATORY_CARE_PROVIDER_SITE_OTHER): Payer: Commercial Managed Care - PPO | Admitting: Family Medicine

## 2013-02-21 VITALS — BP 120/80 | HR 68 | Temp 98.3°F | Ht 64.75 in | Wt 227.0 lb

## 2013-02-21 DIAGNOSIS — D509 Iron deficiency anemia, unspecified: Secondary | ICD-10-CM | POA: Insufficient documentation

## 2013-02-21 DIAGNOSIS — E669 Obesity, unspecified: Secondary | ICD-10-CM

## 2013-02-21 DIAGNOSIS — I10 Essential (primary) hypertension: Secondary | ICD-10-CM | POA: Insufficient documentation

## 2013-02-21 MED ORDER — LISINOPRIL 10 MG PO TABS: 10.0000 mg | ORAL_TABLET | Freq: Every day | ORAL | Status: DC

## 2013-02-21 NOTE — Patient Instructions (Addendum)
It was to meet you. Please stop by to see Shirlee Limerick on your way out to set up your physical.  Please schedule a complete physical in June.

## 2013-02-21 NOTE — Progress Notes (Signed)
Subjective:    Patient ID: Grace Gallagher, female    DOB: [birth year], 42 y.o.   MRN: [id number]  HPI  Very pleasant 42 yo female here to establish care.  Obesity- has been trying to lose weight for years.  Lost approximately 50 pounds after her breast reduction.  Trying to cut back on portions and exercises for 30 minutes a day during her lunch break at [facility]. Example of what she might eat for lunch is a pack of crackers or a baked potato with butter.  HTN- has been well controlled on Lisinopril 10 mg daily.  Previously was on HCTZ and Lisinopril.  She is s/p BTL. No HA, blurred vision, CP or SOB.  H/o microcytic anemia- on iron.  She would prefer to defer labs today. Lab Results  Component Value Date   WBC 6.3 10/08/2012   HGB 9.7* 10/08/2012   HCT 30.6* 10/08/2012   MCV 68.2* 10/08/2012   PLT 197 10/08/2012   Patient Active Problem List  Diagnosis  . HTN (hypertension)  . Obesity, unspecified  . Microcytic anemia   Past Medical History  Diagnosis Date  . Hypertension   . Anemia    Past Surgical History  Procedure Laterality Date  . Foot surgery    . Tubal ligation    . Breast surgery  08/2011    breast reduction   History  Substance Use Topics  . Smoking status: Never Smoker   . Smokeless tobacco: Not on file  . Alcohol Use: No   Family History  Problem Relation Age of Onset  . Hypertension Mother    Allergies  Allergen Reactions  . Hydrocodone Nausea Only   Current Outpatient Prescriptions on File Prior to Visit  Medication Sig Dispense Refill  . ferrous sulfate 325 (65 FE) MG tablet Take 325 mg by mouth 2 (two) times daily.       No current facility-administered medications on file prior to visit.   The PMH, PSH, Social History, Family History, Medications, and allergies have been reviewed in [facility], and have been updated if relevant.   Review of Systems See HPI    Objective:   Physical Exam BP 120/80  Pulse 68  Temp(Src) 98.3 F (36.8 C)  Ht  5' 4.75" (1.645 m)  Wt 227 lb (102.967 kg)  BMI 38.05 kg/m2  General:  Obese, Well-developed,well-nourished,in no acute distress; alert,appropriate and cooperative throughout examination Head:  normocephalic and atraumatic.   Eyes:  vision grossly intact, pupils equal, pupils round, and pupils reactive to light.   Ears:  R ear normal and L ear normal.   Nose:  no external deformity.   Mouth:  good dentition.   Neck:  No deformities, masses, or tenderness noted. Lungs:  Normal respiratory effort, chest expands symmetrically. Lungs are clear to auscultation, no crackles or wheezes. Heart:  Normal rate and regular rhythm. S1 and S2 normal without gallop, murmur, click, rub or other extra sounds. Abdomen:  Bowel sounds positive,abdomen soft and non-tender without masses, organomegaly or hernias noted. Msk:  No deformity or scoliosis noted of thoracic or lumbar spine.   Extremities:  No clubbing, cyanosis, edema, or deformity noted with normal full range of motion of all joints.   Neurologic:  alert & oriented X3 and gait normal.   Skin:  Intact without suspicious lesions or rashes Cervical Nodes:  No lymphadenopathy noted Axillary Nodes:  No palpable lymphadenopathy Psych:  Cognition and judgment appear intact. Alert and cooperative with normal attention span and  concentration. No apparent delusions, illusions, hallucinations    Assessment & Plan:  1. HTN (hypertension) Stable on low dose lisinopril.  Rx refilled.  2. Obesity, unspecified Congratulated her on her current weight loss. Diet is likely playing a roll in her inability to lose weight.  Will refer to nutritionist.  Also explained that I cannot prescribe phentermine given her h/o HTN. The patient indicates understanding of these issues and agrees with the plan.  - Amb ref to Medical Nutrition Therapy-MNT  3. Microcytic anemia Continue iron.

## 2013-03-02 IMAGING — CT CT MAXILLOFACIAL W/ CM
1 series · 15 of 30 positions shown, 19 images · IV contrast (omnipaque)
Comparison: None.

CLINICAL DATA: Left-sided facial swelling

CT MAXILLOFACIAL WITH CONTRAST
TECHNIQUE: Multidetector CT imaging of the maxillofacial
structures was performed with intravenous contrast. Multiplanar CT
image reconstructions were also generated.
Contrast: 100mL OMNIPAQUE IOHEXOL 300 MG/ML  SOLN

[Series 3: facial st · axial · 0.37mm/px · z∈[+1269,+1405]mm · 15 of 74 slices shown, 19 images]
[im 3/74  brain]
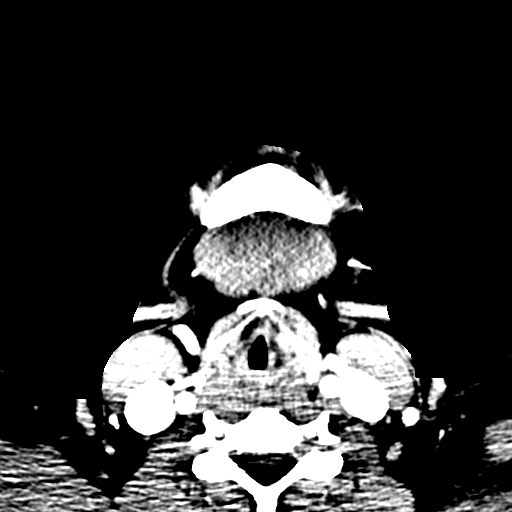
[im 3/74  bone]
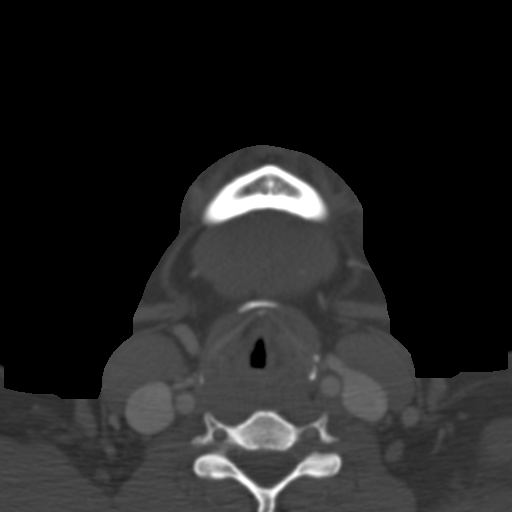
[im 8/74  bone]
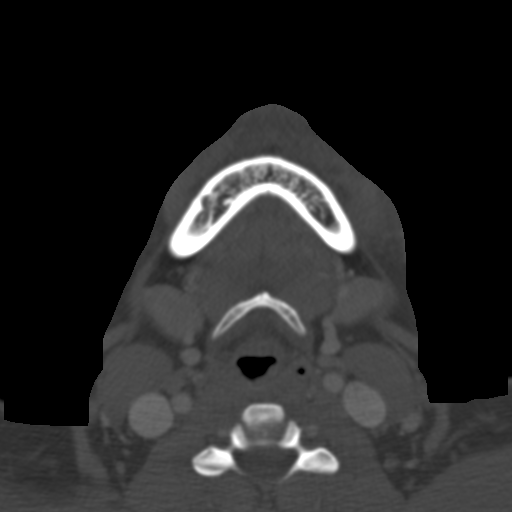
[im 13/74  bone]
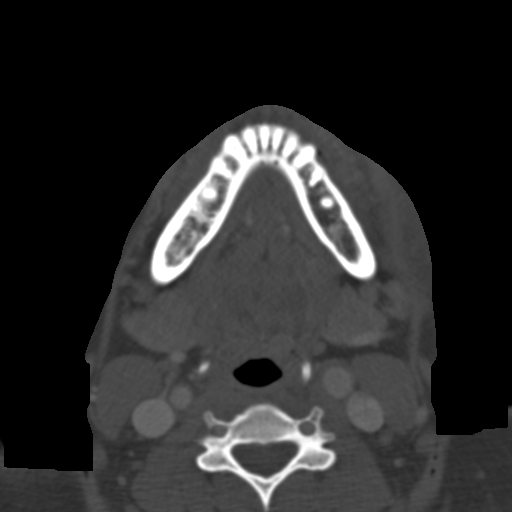
[im 18/74  bone]
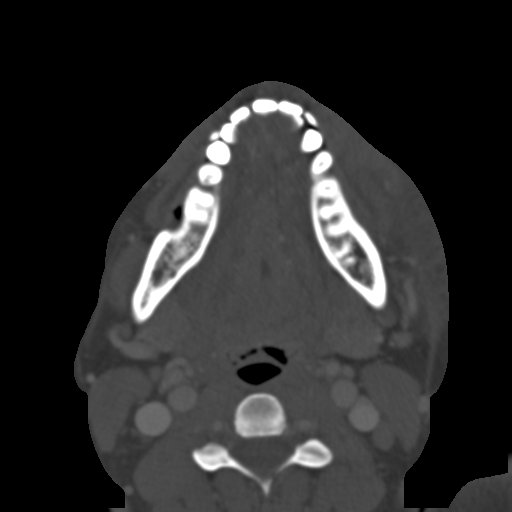
[im 23/74  brain]
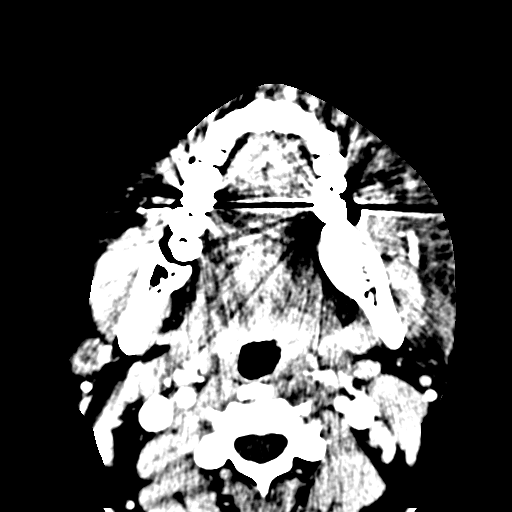
[im 23/74  bone]
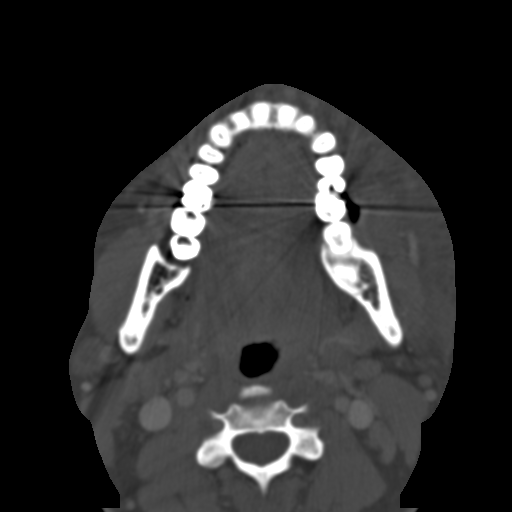
[im 28/74  bone]
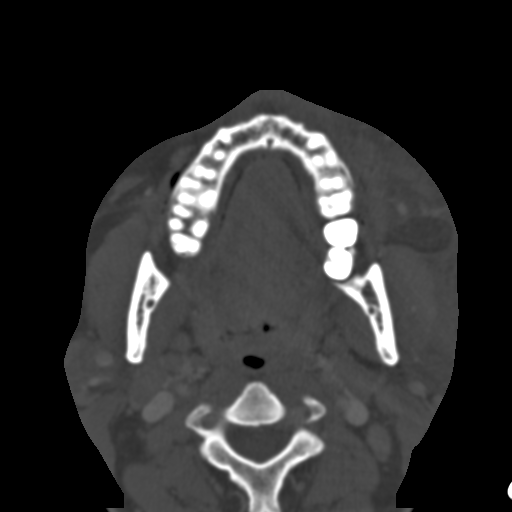
[im 33/74  bone]
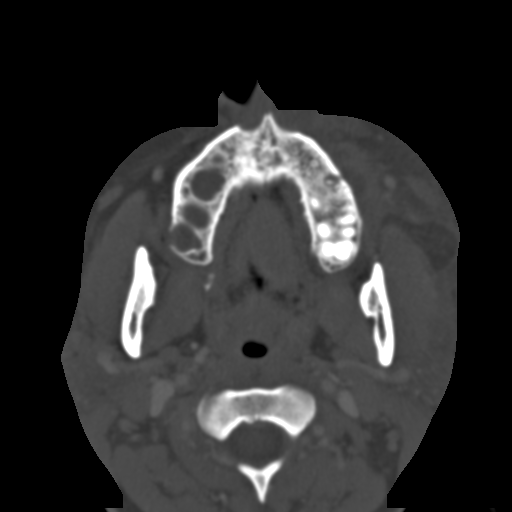
[im 38/74  bone]
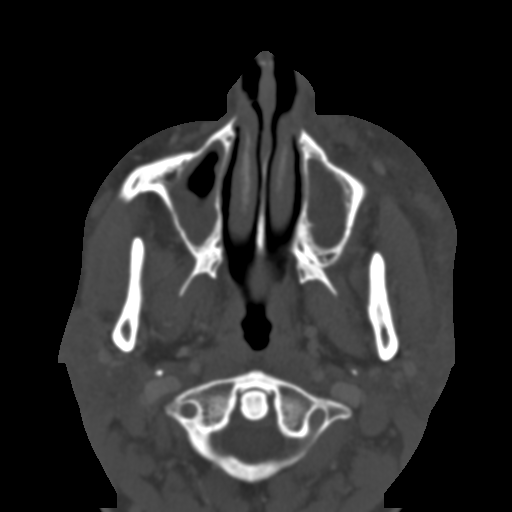
[im 41/74  brain]
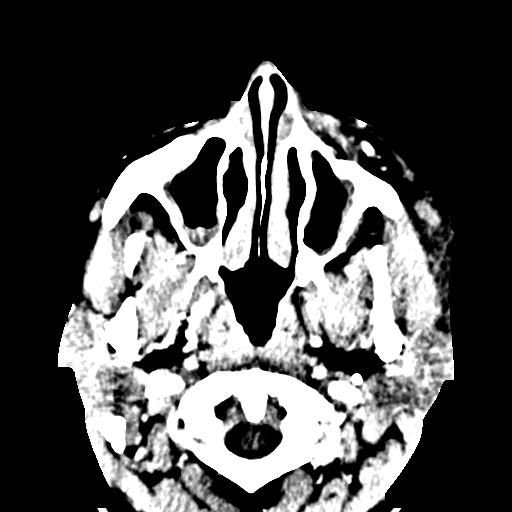
[im 41/74  bone]
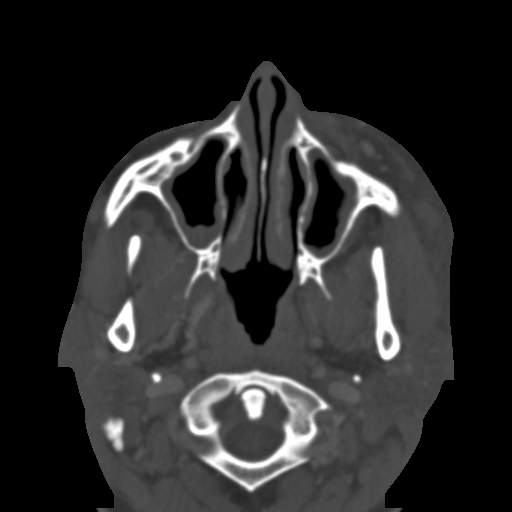
[im 46/74  bone]
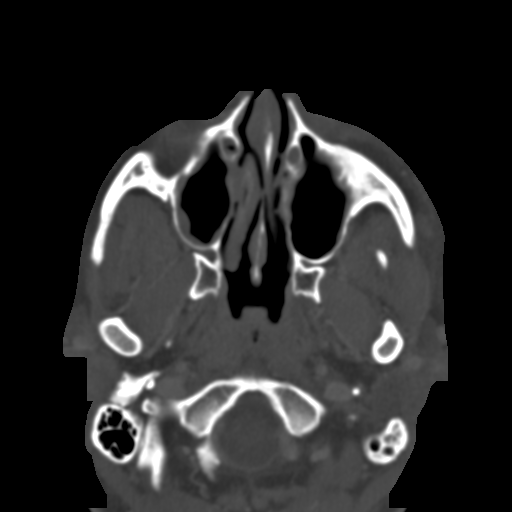
[im 51/74  bone]
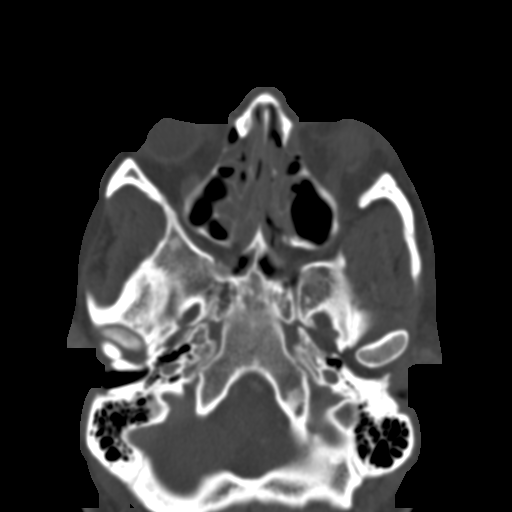
[im 56/74  bone]
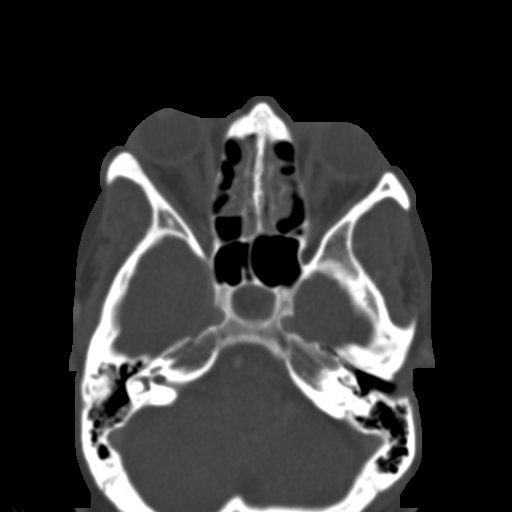
[im 61/74  brain]
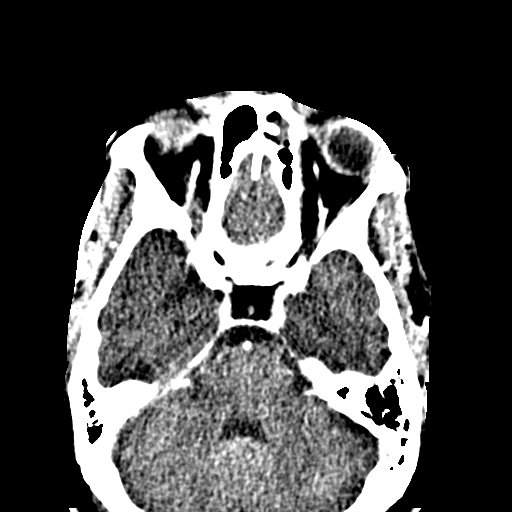
[im 61/74  bone]
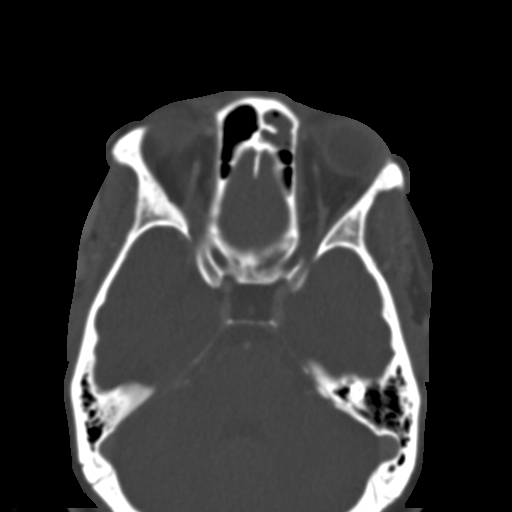
[im 66/74  bone]
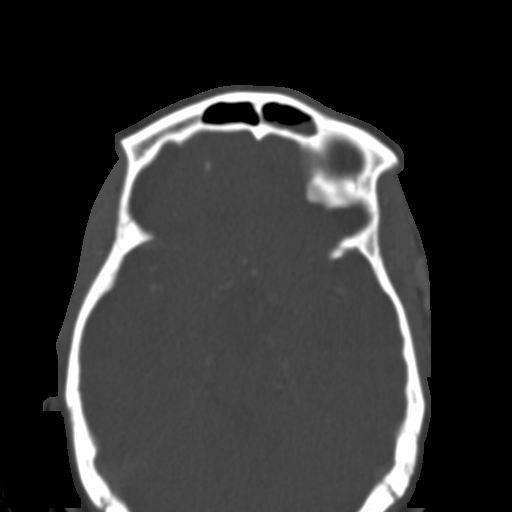
[im 71/74  bone]
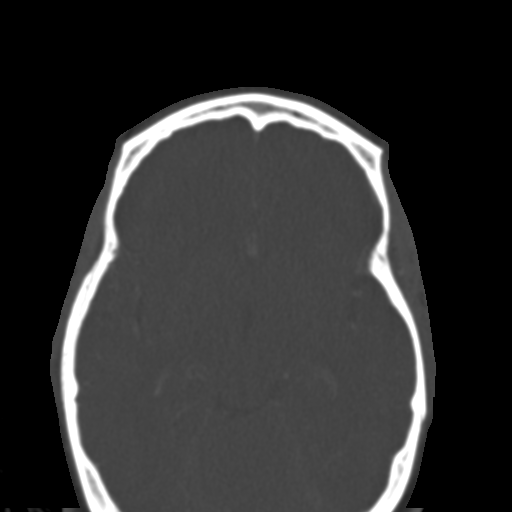

[15 of 30 positions shown; findings below may reference images not displayed]

FINDINGS: There is pronounced diffuse soft tissue swelling of the
left side of the face consistent with diffuse cellulitis.
Interestingly and, I think, incidentally, there is a lipoma within
the soft tissues of the left cheek measuring approximately 2.5 cm
in size.  This extends anterior to the masseter muscle.    No
intracranial abnormality.  No fluid in the middle ears or mastoids.
There is some mucosal inflammation of the paranasal sinuses.  No
intra orbital inflammatory change.  No sign of fracture.  There is
extensive periodontal disease.  There appears to be a peri apical
abscess with lateral cortical erosion of the left side of the
maxilla at tooth number 13. There is an adjacent 6 x 13 mm abscess.

There is a chronic radicular cyst associated with the roots of
tooth number one.  There are multiple other dental caries.
IMPRESSION: Left facial cellulitis.  Genesis probably relates to dental decay
with peri apical abscess at tooth number 13, with lateral cortical
erosion of the maxilla.  There is a small adjacent soft tissue
abscess on the order of 6 x 13 mm.

Incidental depiction of a 2.5 cm lipoma of the left face.

## 2013-03-27 ENCOUNTER — Ambulatory Visit: Payer: Self-pay | Admitting: Family Medicine

## 2013-03-29 ENCOUNTER — Encounter: Payer: Self-pay | Admitting: Family Medicine

## 2013-03-29 ENCOUNTER — Ambulatory Visit (INDEPENDENT_AMBULATORY_CARE_PROVIDER_SITE_OTHER): Payer: Commercial Managed Care - PPO | Admitting: Family Medicine

## 2013-03-29 VITALS — BP 136/90 | HR 80 | Temp 98.3°F | Wt 224.8 lb

## 2013-03-29 DIAGNOSIS — R51 Headache: Secondary | ICD-10-CM

## 2013-03-29 MED ORDER — CYCLOBENZAPRINE HCL 10 MG PO TABS: 5.0000 mg | ORAL_TABLET | Freq: Three times a day (TID) | ORAL | Status: DC | PRN

## 2013-03-29 NOTE — Patient Instructions (Addendum)
Don't take goody's, aspirin, ibuprofen, or aleve.  Use flexeril if needed for the headache.   Call Dr. Dayton Martes with an update next week.  Take care.

## 2013-03-29 NOTE — Progress Notes (Signed)
Seen today for HA.  This episode started back in 10/13.  Had ER eval in 12/13, given zmax w/o any changes.  Pain is frontal, occ on the top of her head.  Throbbing occ.  H/o photophobia.  Goody's tend to help but not resolve it.  Taking goody's ~2x/week.  She rates 6-8/10 pain consistently and now.  No ear pain.  Some rhinorrhea, consistently.  Very rare ST sx, not now.  occ cough.  No fevers.  She thinks her senses of taste and smell have been blunted.  She has has a HA every day for the last few months.     She's had HA over the years, but not this often.  She wasn't formally dx'd with migraines.  She occ has muscle tightness in her neck and occ some nausea.  No vomiting.  No aura, no vision changes.    Meds, vitals, and allergies reviewed.   ROS: See HPI.  Otherwise, noncontributory.  GEN: nad, alert and oriented HEENT: mucous membranes moist NECK: supple w/o LA CV: rrr. PULM: ctab, no inc wob ABD: soft, +bs EXT: no edema SKIN: no acute rash CN 2-12 wnl B, S/S/DTR wnl x4 Fundus wnl x2

## 2013-03-31 DIAGNOSIS — R519 Headache, unspecified: Secondary | ICD-10-CM | POA: Insufficient documentation

## 2013-03-31 DIAGNOSIS — R51 Headache: Secondary | ICD-10-CM | POA: Insufficient documentation

## 2013-03-31 NOTE — Assessment & Plan Note (Signed)
Persistent, daily, responsive to goody's. With nausea and photophobia with normal fundi and BP okay.  Likely combination of daily HA, rebound HA, migraine.  Advised to use flexeril prn HA now, stop goody's, and then f/u with PCP re: chronic control of HA.  She agrees.  Nontoxic.  No need to image currently.

## 2013-04-04 ENCOUNTER — Ambulatory Visit: Payer: Self-pay | Admitting: Family Medicine

## 2013-04-25 ENCOUNTER — Ambulatory Visit: Payer: Commercial Managed Care - PPO | Admitting: Family Medicine

## 2013-05-01 ENCOUNTER — Encounter: Payer: Self-pay | Admitting: Family Medicine

## 2013-05-01 ENCOUNTER — Ambulatory Visit (INDEPENDENT_AMBULATORY_CARE_PROVIDER_SITE_OTHER): Payer: Commercial Managed Care - PPO | Admitting: Family Medicine

## 2013-05-01 VITALS — BP 140/90 | HR 80 | Temp 98.1°F | Wt 222.0 lb

## 2013-05-01 DIAGNOSIS — J309 Allergic rhinitis, unspecified: Secondary | ICD-10-CM

## 2013-05-01 MED ORDER — FLUTICASONE PROPIONATE 50 MCG/ACT NA SUSP: 2.0000 | Freq: Every day | NASAL | Status: DC

## 2013-05-01 NOTE — Progress Notes (Signed)
SUBJECTIVE:  Grace Gallagher is a 42 y.o. female who complains of coryza, congestion, sneezing, sore throat and itching in eyes for 4 months. She denies a history of anorexia, chest pain, chills, dizziness and fevers and denies a history of asthma. Patient denies smoke cigarettes.  She has been taking Benadryl without much improvement.  Does have allergy to pollen.   Patient Active Problem List   Diagnosis Date Noted  . Headache(784.0) 03/31/2013  . HTN (hypertension) 02/21/2013  . Obesity, unspecified 02/21/2013  . Microcytic anemia 02/21/2013   Past Medical History  Diagnosis Date  . Hypertension   . Anemia    Past Surgical History  Procedure Laterality Date  . Foot surgery    . Tubal ligation    . Breast surgery  08/2011    breast reduction   History  Substance Use Topics  . Smoking status: Never Smoker   . Smokeless tobacco: Not on file  . Alcohol Use: No   Family History  Problem Relation Age of Onset  . Hypertension Mother    Allergies  Allergen Reactions  . Hydrocodone Nausea Only   Current Outpatient Prescriptions on File Prior to Visit  Medication Sig Dispense Refill  . cyclobenzaprine (FLEXERIL) 10 MG tablet Take 0.5-1 tablets (5-10 mg total) by mouth 3 (three) times daily as needed (for headache.  sedation caution).  30 tablet  0  . ferrous sulfate 325 (65 FE) MG tablet Take 325 mg by mouth 2 (two) times daily.      Marland Kitchen lisinopril (PRINIVIL,ZESTRIL) 10 MG tablet Take 1 tablet (10 mg total) by mouth daily.  90 tablet  3   No current facility-administered medications on file prior to visit.   The PMH, PSH, Social History, Family History, Medications, and allergies have been reviewed in [facility], and have been updated if relevant.  OBJECTIVE: BP 140/90  Pulse 80  Temp(Src) 98.1 F (36.7 C)  Wt 222 lb (100.699 kg)  BMI 37.21 kg/m2  She appears well, vital signs are as noted. Ears normal.  Throat and pharynx normal.  Neck supple. No adenopathy in the neck. Nose  is congested. Sinuses non tender. The chest is clear, without wheezes or rales.  ASSESSMENT:  allergic rhinitis  PLAN: Add decongestant to antihistamine, flonase. Symptomatic therapy suggested: push fluids, rest and return office visit prn if symptoms persist or worsen.  Call or return to clinic prn if these symptoms worsen or fail to improve as anticipated.

## 2013-05-01 NOTE — Patient Instructions (Addendum)
Good to see you. Let's start flonase as directed- the prescription has been sent.     Also try an antihistamine/decongestant like Allegra D, claritin D or zyrtec D over the counter- two times a day as needed ( have to sign for them at pharmacy).   You can use warm compresses.  Cough suppressant at night.   Call if not improving as expected in 5-7 days.

## 2013-05-14 ENCOUNTER — Encounter (HOSPITAL_COMMUNITY): Payer: Self-pay | Admitting: Emergency Medicine

## 2013-05-14 ENCOUNTER — Emergency Department (HOSPITAL_COMMUNITY)
Admission: EM | Admit: 2013-05-14 | Discharge: 2013-05-14 | Disposition: A | Discharge status: Home / Self Care | Payer: Commercial Managed Care - PPO | Attending: Emergency Medicine | Admitting: Emergency Medicine

## 2013-05-14 ENCOUNTER — Emergency Department (HOSPITAL_COMMUNITY): Payer: Commercial Managed Care - PPO

## 2013-05-14 DIAGNOSIS — I10 Essential (primary) hypertension: Secondary | ICD-10-CM | POA: Insufficient documentation

## 2013-05-14 DIAGNOSIS — S61209A Unspecified open wound of unspecified finger without damage to nail, initial encounter: Secondary | ICD-10-CM | POA: Insufficient documentation

## 2013-05-14 DIAGNOSIS — Y93H2 Activity, gardening and landscaping: Secondary | ICD-10-CM | POA: Insufficient documentation

## 2013-05-14 DIAGNOSIS — Z23 Encounter for immunization: Secondary | ICD-10-CM | POA: Insufficient documentation

## 2013-05-14 DIAGNOSIS — S61219A Laceration without foreign body of unspecified finger without damage to nail, initial encounter: Secondary | ICD-10-CM

## 2013-05-14 DIAGNOSIS — W298XXA Contact with other powered powered hand tools and household machinery, initial encounter: Secondary | ICD-10-CM | POA: Insufficient documentation

## 2013-05-14 DIAGNOSIS — Z885 Allergy status to narcotic agent status: Secondary | ICD-10-CM | POA: Insufficient documentation

## 2013-05-14 DIAGNOSIS — Y92009 Unspecified place in unspecified non-institutional (private) residence as the place of occurrence of the external cause: Secondary | ICD-10-CM | POA: Insufficient documentation

## 2013-05-14 DIAGNOSIS — D649 Anemia, unspecified: Secondary | ICD-10-CM | POA: Insufficient documentation

## 2013-05-14 DIAGNOSIS — Z79899 Other long term (current) drug therapy: Secondary | ICD-10-CM | POA: Insufficient documentation

## 2013-05-14 MED ORDER — TETANUS-DIPHTH-ACELL PERTUSSIS 5-2.5-18.5 LF-MCG/0.5 IM SUSP
0.5000 mL | Freq: Once | INTRAMUSCULAR | Status: AC
Start: 2013-05-14 — End: 2013-05-14
  Administered 2013-05-14: 0.5 mL via INTRAMUSCULAR
  Filled 2013-05-14: qty 0.5

## 2013-05-14 NOTE — ED Provider Notes (Signed)
History     CSN: [id number]  Arrival date & time 05/14/13  0919   First MD Initiated Contact with Patient 05/14/13 [phone]      Chief Complaint  Patient presents with  . Finger Injury    (Consider location/radiation/quality/duration/timing/severity/associated sxs/prior treatment) HPI Comments: Patient is a 42 year old female who presents for left fourth finger pain with onset this morning. Patient states she was trimming her hedges when the hedge trimmers punctured the ventral aspect of her 4th finger. Patient admits to localized pain in her fingertip that is nonradiating; constant since onset without aggravating or alleviating factors. Patient unsure about date of last tetanus. Denies numbness, weakness, pallor, and fevers.  The history is provided by the patient. No language interpreter was used.    Past Medical History  Diagnosis Date  . Hypertension   . Anemia     Past Surgical History  Procedure Laterality Date  . Foot surgery    . Tubal ligation    . Breast surgery  08/2011    breast reduction    Family History  Problem Relation Age of Onset  . Hypertension Mother     History  Substance Use Topics  . Smoking status: Never Smoker   . Smokeless tobacco: Not on file  . Alcohol Use: No    OB History   Grav Para Term Preterm Abortions TAB SAB Ect Mult Living                  Review of Systems  Constitutional: Negative for fever.  Musculoskeletal: Negative for joint swelling.  Skin: Positive for wound. Negative for pallor.  Neurological: Negative for weakness and numbness.  All other systems reviewed and are negative.    Allergies  Hydrocodone  Home Medications   Current Outpatient Rx  Name  Route  Sig  Dispense  Refill  . cyclobenzaprine (FLEXERIL) 10 MG tablet   Oral   Take 5-10 mg by mouth 3 (three) times daily as needed for muscle spasms.         . ferrous sulfate 325 (65 FE) MG tablet   Oral   Take 325 mg by mouth 2 (two) times daily.           . fluticasone (FLONASE) 50 MCG/ACT nasal spray   Nasal   Place 2 sprays into the nose daily as needed for rhinitis.         Marland Kitchen lisinopril (PRINIVIL,ZESTRIL) 10 MG tablet   Oral   Take 1 tablet (10 mg total) by mouth daily.   90 tablet   3     BP 145/98  Pulse 70  Temp(Src) 98.2 F (36.8 C) (Oral)  Resp 18  SpO2 99%  Physical Exam  Nursing note and vitals reviewed. Constitutional: She is oriented to person, place, and time. She appears well-developed and well-nourished. No distress.  HENT:  Head: Normocephalic and atraumatic.  Eyes: Conjunctivae and EOM are normal. No scleral icterus.  Neck: Normal range of motion.  Cardiovascular: Normal rate, regular rhythm and intact distal pulses.   Distal radial pulses 2+ bilaterally. Capillary refill normal.  Pulmonary/Chest: Effort normal. No respiratory distress.  Musculoskeletal:       Left hand: She exhibits tenderness and laceration. She exhibits normal range of motion, no bony tenderness, normal two-point discrimination, normal capillary refill, no deformity and no swelling. Normal sensation noted. Normal strength noted.       Hands: TTP of ventral surface of 4th L finger without bony tenderness. 5/5 strength against  resistance of FDP, FDS, and extensors of L 4th finger. No numbness, erythema, or pallor.  Neurological: She is alert and oriented to person, place, and time.  Skin: Skin is warm and dry. She is not diaphoretic. No pallor.  Psychiatric: She has a normal mood and affect. Her behavior is normal.    ED Course  Procedures (including critical care time)  Labs Reviewed - No data to display Dg Finger Ring Left  05/14/2013   *RADIOLOGY REPORT*  Clinical Data: Pain post trauma  LEFT RING FINGER 2+V  Comparison: None.  Findings: Frontal, oblique, and lateral views were obtained.  There is no fracture or dislocation.  No radiopaque foreign body.  There is narrowing of the fourth DIP joint with slight spurring.  No  erosive change.  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  No fracture or radiopaque foreign body.   Original Report Authenticated By: Bretta Bang, M.D.   LACERATION REPAIR Performed by: Antony Madura Authorized by: Antony Madura Consent: Verbal consent obtained. Risks and benefits: risks, benefits and alternatives were discussed Consent given by: patient Patient identity confirmed: provided demographic data Prepped and Draped in normal sterile fashion Wound explored  Laceration Location: L 4th finger  Laceration Length: 1cm  No Foreign Bodies seen or palpated  Anesthesia: local infiltration  Local anesthetic: lidocaine 2% without epinephrine  Anesthetic total: 2 ml  Irrigation method: syringe Amount of cleaning: standard  Skin closure: 6-0 prolene  Number of sutures: 3  Technique: simple interrupted  Patient tolerance: Patient tolerated the procedure well with no immediate complications.   1. Finger laceration, initial encounter     MDM  Uncomplicated laceration to distal L 4th finger. Patient neurovascularly intact without pallor, pulselessness, paresthesias, or poikilothermia. Xray without evidence of fracture or radiopaque foreign body. Tetanus given in ED today. Patient currently taking penicillin PO for toothache. Patient appropriate for d/c with hand specialist f/u and f/u in 10-14 days for suture removal; advised to have sutures removed at PCP or Urgent Care. Indications for ED return discussed. Patient verbalizes comfort and understanding with this d/c plan with no unaddressed concerns.        Antony Madura, PA-C 05/17/13 1846

## 2013-05-14 NOTE — ED Notes (Signed)
Pt with small laceration across tip of 4th digit on left hand from hedge clippers; bleeding controlled

## 2013-05-15 ENCOUNTER — Ambulatory Visit (INDEPENDENT_AMBULATORY_CARE_PROVIDER_SITE_OTHER): Payer: Commercial Managed Care - PPO | Admitting: Family Medicine

## 2013-05-15 ENCOUNTER — Encounter: Payer: Self-pay | Admitting: Family Medicine

## 2013-05-15 VITALS — BP 136/98 | HR 80 | Temp 98.6°F | Wt 224.0 lb

## 2013-05-15 DIAGNOSIS — I10 Essential (primary) hypertension: Secondary | ICD-10-CM

## 2013-05-15 DIAGNOSIS — R51 Headache: Secondary | ICD-10-CM

## 2013-05-15 MED ORDER — LISINOPRIL 20 MG PO TABS: 20.0000 mg | ORAL_TABLET | Freq: Every day | ORAL | Status: DC

## 2013-05-15 NOTE — Patient Instructions (Addendum)
Good to see you. Please increase your lisinopril to 20 mg daily (ok to take two of your 10 mg tablets until you run out). Check your blood pressure at work and keep me updated.

## 2013-05-15 NOTE — Progress Notes (Signed)
Subjective:    Patient ID: Grace Gallagher, female    DOB: 1971-02-03, 42 y.o.   MRN: [id number]  HPI  42 yo with h/o HTN here for elevated BP and HA.  Past several weeks, she feels her BP has been rising.  Taking Lisinopril 10 mg daily.  Checking it at work and usually in 140s-150s/90s-100s.  Can tell when it's elevated- gets a frontal HA.  HA not associated with photophobia, n/v, or other focal neurological issues.  Has had HA in past but in awhile.  BP Readings from Last 3 Encounters:  05/15/13 136/98  05/14/13 145/98  05/01/13 140/90   Patient Active Problem List   Diagnosis Date Noted  . Headache(784.0) 03/31/2013  . HTN (hypertension) 02/21/2013  . Obesity, unspecified 02/21/2013  . Microcytic anemia 02/21/2013   Past Medical History  Diagnosis Date  . Hypertension   . Anemia    Past Surgical History  Procedure Laterality Date  . Foot surgery    . Tubal ligation    . Breast surgery  08/2011    breast reduction   History  Substance Use Topics  . Smoking status: Never Smoker   . Smokeless tobacco: Not on file  . Alcohol Use: No   Family History  Problem Relation Age of Onset  . Hypertension Mother    Allergies  Allergen Reactions  . Hydrocodone Nausea Only   Current Outpatient Prescriptions on File Prior to Visit  Medication Sig Dispense Refill  . cyclobenzaprine (FLEXERIL) 10 MG tablet Take 5-10 mg by mouth 3 (three) times daily as needed for muscle spasms.      . ferrous sulfate 325 (65 FE) MG tablet Take 325 mg by mouth 2 (two) times daily.      . fluticasone (FLONASE) 50 MCG/ACT nasal spray Place 2 sprays into the nose daily as needed for rhinitis.       No current facility-administered medications on file prior to visit.   The PMH, PSH, Social History, Family History, Medications, and allergies have been reviewed in [facility], and have been updated if relevant.    Review of Systems See HPI    Objective:   Physical Exam BP 136/98  Pulse 80   Temp(Src) 98.6 F (37 C)  Wt 224 lb (101.606 kg)  BMI 37.55 kg/m2  General:  Well-developed,well-nourished,in no acute distress; alert,appropriate and cooperative throughout examination Head:  normocephalic and atraumatic.   Eyes:  vision grossly intact, pupils equal, pupils round, and pupils reactive to light.   Lungs:  Normal respiratory effort, chest expands symmetrically. Lungs are clear to auscultation, no crackles or wheezes. Heart:  Normal rate and regular rhythm. S1 and S2 normal without gallop, murmur, click, rub or other extra sounds. Abdomen:  Bowel sounds positive,abdomen soft and non-tender without masses, organomegaly or hernias noted. Msk:  No deformity or scoliosis noted of thoracic or lumbar spine.   Extremities:  No clubbing, cyanosis, edema, or deformity noted with normal full range of motion of all joints.   Neurologic:  alert & oriented X3 and gait normal.   Skin:  Intact without suspicious lesions or rashes Psych:  Cognition and judgment appear intact. Alert and cooperative with normal attention span and concentration. No apparent delusions, illusions, hallucinations        Assessment & Plan:  1. HTN (hypertension) Mildly elevated today but she is symptomatic.  Will increase Lisinopril to 20 mg daily- she works at [employer] and can check BP regularly.  Discussed sx of orthostatic hypotension. She  will call me with her BP readings. The patient indicates understanding of these issues and agrees with the plan.   2. Headache(784.0) Likely related to #1.  See above.

## 2013-05-18 NOTE — ED Provider Notes (Signed)
Medical screening examination/treatment/procedure(s) were performed by non-physician practitioner and as supervising physician I was immediately available for consultation/collaboration.   Areyana Leoni III, MD 05/18/13 1143 

## 2013-06-26 ENCOUNTER — Telehealth: Payer: Self-pay | Admitting: Family Medicine

## 2013-06-26 NOTE — Telephone Encounter (Signed)
Patient has an appointment in September for a physical, but she would like to start birth control before September.  Can patient be seen sooner to start birth control?

## 2013-06-26 NOTE — Telephone Encounter (Signed)
Yes ok to make sooner appt.

## 2013-07-01 ENCOUNTER — Encounter: Payer: Self-pay | Admitting: Family Medicine

## 2013-07-01 ENCOUNTER — Other Ambulatory Visit (HOSPITAL_COMMUNITY)
Admission: RE | Admit: 2013-07-01 | Discharge: 2013-07-01 | Disposition: A | Discharge status: Home / Self Care | Payer: Commercial Managed Care - PPO | Source: Ambulatory Visit | Attending: Family Medicine | Admitting: Family Medicine

## 2013-07-01 ENCOUNTER — Ambulatory Visit (INDEPENDENT_AMBULATORY_CARE_PROVIDER_SITE_OTHER): Payer: Commercial Managed Care - PPO | Admitting: Family Medicine

## 2013-07-01 VITALS — BP 100/62 | HR 72 | Temp 98.5°F | Ht 64.75 in | Wt 231.0 lb

## 2013-07-01 DIAGNOSIS — Z113 Encounter for screening for infections with a predominantly sexual mode of transmission: Secondary | ICD-10-CM | POA: Insufficient documentation

## 2013-07-01 DIAGNOSIS — Z309 Encounter for contraceptive management, unspecified: Secondary | ICD-10-CM | POA: Insufficient documentation

## 2013-07-01 DIAGNOSIS — D509 Iron deficiency anemia, unspecified: Secondary | ICD-10-CM

## 2013-07-01 DIAGNOSIS — Z136 Encounter for screening for cardiovascular disorders: Secondary | ICD-10-CM

## 2013-07-01 DIAGNOSIS — Z1151 Encounter for screening for human papillomavirus (HPV): Secondary | ICD-10-CM | POA: Insufficient documentation

## 2013-07-01 DIAGNOSIS — Z01419 Encounter for gynecological examination (general) (routine) without abnormal findings: Secondary | ICD-10-CM

## 2013-07-01 DIAGNOSIS — I10 Essential (primary) hypertension: Secondary | ICD-10-CM

## 2013-07-01 MED ORDER — MEDROXYPROGESTERONE ACETATE 150 MG/ML IM SUSP
150.0000 mg | Freq: Once | INTRAMUSCULAR | Status: AC
  Administered 2013-07-01: 150 mg via INTRAMUSCULAR

## 2013-07-01 NOTE — Progress Notes (Signed)
Subjective:    Patient ID: Grace Gallagher, female    DOB: 03-18-1971, 42 y.o.   MRN: [id number]  HPI  Very pleasant 42 yo female here for CPX.  Has never had a mammogram. No family history of breast, uterine, cervical or ovarian CA.  She is interested in restarting birth control.  Currently in a stable sexual relationship.  Denies any dysuria or vaginal discharge.  Due for pap smear. No h/o abnormal pap smears.  Periods are very heavy- does have h/o microcytic anemia. Has been fatigued. Lab Results  Component Value Date   WBC 6.3 10/08/2012   HGB 9.7* 10/08/2012   HCT 30.6* 10/08/2012   MCV 68.2* 10/08/2012   PLT 197 10/08/2012     HTN- much improved on lisinopril 20 mg daily. Lab Results  Component Value Date   CREATININE 0.64 10/08/2012   Patient Active Problem List   Diagnosis Date Noted  . Unspecified contraceptive management 07/01/2013  . Encounter for routine gynecological examination 07/01/2013  . Headache(784.0) 03/31/2013  . HTN (hypertension) 02/21/2013  . Obesity, unspecified 02/21/2013  . Microcytic anemia 02/21/2013   Past Medical History  Diagnosis Date  . Hypertension   . Anemia    Past Surgical History  Procedure Laterality Date  . Foot surgery    . Tubal ligation    . Breast surgery  08/2011    breast reduction   History  Substance Use Topics  . Smoking status: Never Smoker   . Smokeless tobacco: Not on file  . Alcohol Use: No   Family History  Problem Relation Age of Onset  . Hypertension Mother    Allergies  Allergen Reactions  . Hydrocodone Nausea Only   Current Outpatient Prescriptions on File Prior to Visit  Medication Sig Dispense Refill  . cyclobenzaprine (FLEXERIL) 10 MG tablet Take 5-10 mg by mouth 3 (three) times daily as needed for muscle spasms.      . ferrous sulfate 325 (65 FE) MG tablet Take 325 mg by mouth 2 (two) times daily.      . fluticasone (FLONASE) 50 MCG/ACT nasal spray Place 2 sprays into the nose daily as  needed for rhinitis.      Marland Kitchen lisinopril (PRINIVIL,ZESTRIL) 20 MG tablet Take 1 tablet (20 mg total) by mouth daily.  90 tablet  3   No current facility-administered medications on file prior to visit.   The PMH, PSH, Social History, Family History, Medications, and allergies have been reviewed in [facility], and have been updated if relevant.   Review of Systems See HPI No HA, blurred vision, or CP No anxiety or depression No changes in bowel habits No blood in stool    Objective:   Physical Exam BP 100/62  Pulse 72  Temp(Src) 98.5 F (36.9 C)  Ht 5' 4.75" (1.645 m)  Wt 231 lb (104.781 kg)  BMI 38.72 kg/m2  General:  Well-developed,well-nourished,in no acute distress; alert,appropriate and cooperative throughout examination Head:  normocephalic and atraumatic.   Eyes:  vision grossly intact, pupils equal, pupils round, and pupils reactive to light.   Ears:  R ear normal and L ear normal.   Nose:  no external deformity.   Mouth:  good dentition.   Neck:  No deformities, masses, or tenderness noted. Breasts:  No mass, nodules, thickening, tenderness, bulging, retraction, inflamation, nipple discharge or skin changes noted.   Lungs:  Normal respiratory effort, chest expands symmetrically. Lungs are clear to auscultation, no crackles or wheezes. Heart:  Normal rate and  regular rhythm. S1 and S2 normal without gallop, murmur, click, rub or other extra sounds. Abdomen:  Bowel sounds positive,abdomen soft and non-tender without masses, organomegaly or hernias noted. Rectal:  no external abnormalities.   Genitalia:  Pelvic Exam:        External: normal female genitalia without lesions or masses        Vagina: normal without lesions or masses        Cervix: normal without lesions or masses        Adnexa: normal bimanual exam without masses or fullness        Uterus: normal by palpation        Pap smear: performed Msk:  No deformity or scoliosis noted of thoracic or lumbar spine.    Extremities:  No clubbing, cyanosis, edema, or deformity noted with normal full range of motion of all joints.   Neurologic:  alert & oriented X3 and gait normal.   Skin:  Intact without suspicious lesions or rashes Cervical Nodes:  No lymphadenopathy noted Axillary Nodes:  No palpable lymphadenopathy Psych:  Cognition and judgment appear intact. Alert and cooperative with normal attention span and concentration. No apparent delusions, illusions, hallucinations        Assessment & Plan:   1. HTN (hypertension) Stable on current dose of lisinopril. No changes.  2. Unspecified contraceptive management Discussed options.  She would like to try depo.  Used in past. IM depo given today.  U preg neg.  3. Encounter for routine gynecological examination Pap smear today. Discussed sexual activity, pregnancy risk, and STD risk.  Encouraged to get regular exercise. - Comprehensive metabolic panel  4. Microcytic anemia  - CBC with Differential  5. Screening for ischemic heart disease  - Lipid Panel  6. Screening for STD (sexually transmitted disease)  - HIV Antibody - RPR

## 2013-07-01 NOTE — Addendum Note (Signed)
Addended by: Eliezer Bottom on: 07/01/2013 04:04 PM   Modules accepted: Orders

## 2013-07-01 NOTE — Patient Instructions (Addendum)
Good to see you. We will call you with your lab results.  Please stop by to see Grace Gallagher on your way out to set up your referral.  

## 2013-07-02 LAB — LIPID PANEL
HDL: 53 mg/dL (ref >39.00)
Total CHOL/HDL Ratio: 3
VLDL: 19.6 mg/dL (ref 0.0–40.0)

## 2013-07-02 LAB — COMPREHENSIVE METABOLIC PANEL
ALT: 16 U/L (ref 0–35)
AST: 17 U/L (ref 0–37)
Creatinine, Ser: 0.9 mg/dL (ref 0.4–1.2)
Total Bilirubin: 0.6 mg/dL (ref 0.3–1.2)

## 2013-07-02 LAB — CBC WITH DIFFERENTIAL/PLATELET
Basophils Absolute: 0 10*3/uL (ref 0.0–0.1)
Eosinophils Absolute: 0.3 10*3/uL (ref 0.0–0.7)
HCT: 30.3 % — ABNORMAL LOW (ref 36.0–46.0)
Hemoglobin: 9.4 g/dL — ABNORMAL LOW (ref 12.0–15.0)
Lymphs Abs: 2.3 10*3/uL (ref 0.7–4.0)
MCHC: 30.9 g/dL (ref 30.0–36.0)
Neutro Abs: 2 10*3/uL (ref 1.4–7.7)
RDW: 16.5 % — ABNORMAL HIGH (ref 11.5–14.6)

## 2013-07-02 LAB — HIV ANTIBODY (ROUTINE TESTING W REFLEX): HIV: NONREACTIVE

## 2013-07-04 ENCOUNTER — Other Ambulatory Visit: Payer: Self-pay | Admitting: Family Medicine

## 2013-07-04 DIAGNOSIS — D649 Anemia, unspecified: Secondary | ICD-10-CM

## 2013-07-08 ENCOUNTER — Encounter: Payer: Self-pay | Admitting: *Deleted

## 2013-08-19 ENCOUNTER — Other Ambulatory Visit: Payer: Commercial Managed Care - PPO

## 2013-08-26 ENCOUNTER — Encounter: Payer: Commercial Managed Care - PPO | Admitting: Family Medicine

## 2013-08-28 ENCOUNTER — Telehealth: Payer: Self-pay

## 2013-08-28 NOTE — Telephone Encounter (Signed)
Pt called back and scheduled appt Dr Ermalene Searing 08/29/13 at 2:30 pm(pt wanted to see female doctor in the afternoon time). Advised pt if condition changes or worsens prior to appt; tonight pt will go to UC and tomorrow pt would call office.

## 2013-08-28 NOTE — Telephone Encounter (Signed)
Pt said had first depo provera injection on 07/01/13; next depo should be between 09/16/13-09/30/13 and pt has not scheduled that depo yet. Pt said she has not had menstrual period since received depo shot in July but this AM pt spotted for short period of time with lower abdominal cramping; pt said has not see any more spotting but lower abd cramping continues (pain level 7). Pt said had diarrhea x 1 on 08/27/13 but no diarrhea today. No fever, nausea or vomiting or UTI symptoms. Pt request cb. [facility] [address]

## 2013-08-28 NOTE — Telephone Encounter (Signed)
She needs to be evaluated. Please make her an appt to see another provider tomorrow if possible.

## 2013-08-28 NOTE — Telephone Encounter (Signed)
Left v/m for pt to cb. 

## 2013-08-29 ENCOUNTER — Ambulatory Visit (INDEPENDENT_AMBULATORY_CARE_PROVIDER_SITE_OTHER): Payer: Commercial Managed Care - PPO | Admitting: Family Medicine

## 2013-08-29 ENCOUNTER — Encounter: Payer: Self-pay | Admitting: Family Medicine

## 2013-08-29 VITALS — BP 122/90 | HR 72 | Temp 98.9°F | Ht 64.75 in | Wt 233.8 lb

## 2013-08-29 DIAGNOSIS — N898 Other specified noninflammatory disorders of vagina: Secondary | ICD-10-CM

## 2013-08-29 DIAGNOSIS — R109 Unspecified abdominal pain: Secondary | ICD-10-CM

## 2013-08-29 DIAGNOSIS — R1032 Left lower quadrant pain: Secondary | ICD-10-CM | POA: Insufficient documentation

## 2013-08-29 DIAGNOSIS — N920 Excessive and frequent menstruation with regular cycle: Secondary | ICD-10-CM

## 2013-08-29 DIAGNOSIS — R1031 Right lower quadrant pain: Secondary | ICD-10-CM

## 2013-08-29 LAB — POCT URINALYSIS DIPSTICK
Ketones, UA: NEGATIVE
Spec Grav, UA: 1.025
pH, UA: 6.5

## 2013-08-29 LAB — POCT URINE PREGNANCY: Preg Test, Ur: NEGATIVE

## 2013-08-29 NOTE — Telephone Encounter (Signed)
Noted  

## 2013-08-29 NOTE — Patient Instructions (Addendum)
Symptoms likely due to depo provera and menses restarting.  USe ibuprofen 600 mg every 6 hours as needed.  Call if symptoms not improving as expected in next 4-5 days when menses completed.

## 2013-08-29 NOTE — Progress Notes (Signed)
  Subjective:    Patient ID: Grace Gallagher, female    DOB: 1971-07-12, 42 y.o.   MRN: [id number]  HPI 42 year old female pt of Dr. Elmer Sow presents with new onset of 2 days of lower abdominal cramping (similar to menstrual cramping).  Started depo shot for birth control in 06/2013... Has not had a menses since then. ( prior to starting depo she did have a lot of cramping)  She began spotting yesterday AM, and blood on toilet tissue.  Abdominal cramping (5/10) worsened yesterday then better today...constant. She continues to have spotting today but less. No diarrhea, no constipation. No N/V. No dysuria. No urine frequency or urgency.  She is sexually active.  She has no new partners. She has only had on monogomous partner for awhile  Some improvement with ibuprofen.      Review of Systems  Constitutional: Negative for fever and fatigue.  HENT: Negative for ear pain.   Eyes: Negative for pain.  Respiratory: Negative for chest tightness and shortness of breath.   Cardiovascular: Negative for chest pain, palpitations and leg swelling.  Gastrointestinal: Positive for abdominal pain. Negative for anal bleeding.  Genitourinary: Negative for dysuria.       Objective:   Physical Exam  Constitutional: Vital signs are normal. She appears well-developed and well-nourished. She is cooperative.  Non-toxic appearance. She does not appear ill. No distress.  HENT:  Head: Normocephalic.  Right Ear: Hearing, tympanic membrane, external ear and ear canal normal. Tympanic membrane is not erythematous, not retracted and not bulging.  Left Ear: Hearing, tympanic membrane, external ear and ear canal normal. Tympanic membrane is not erythematous, not retracted and not bulging.  Nose: No mucosal edema or rhinorrhea. Right sinus exhibits no maxillary sinus tenderness and no frontal sinus tenderness. Left sinus exhibits no maxillary sinus tenderness and no frontal sinus tenderness.  Mouth/Throat: Uvula is  midline, oropharynx is clear and moist and mucous membranes are normal.  Eyes: Conjunctivae, EOM and lids are normal. Pupils are equal, round, and reactive to light. Lids are everted and swept, no foreign bodies found.  Neck: Trachea normal and normal range of motion. Neck supple. Carotid bruit is not present. No mass and no thyromegaly present.  Cardiovascular: Normal rate, regular rhythm, S1 normal, S2 normal, normal heart sounds, intact distal pulses and normal pulses.  Exam reveals no gallop and no friction rub.   No murmur heard. Pulmonary/Chest: Effort normal and breath sounds normal. Not tachypneic. No respiratory distress. She has no decreased breath sounds. She has no wheezes. She has no rhonchi. She has no rales.  Abdominal: Soft. Normal appearance and bowel sounds are normal. There is no tenderness. There is no CVA tenderness.  Neurological: She is alert.  Skin: Skin is warm, dry and intact. No rash noted.  Psychiatric: Her speech is normal and behavior is normal. Judgment and thought content normal. Her mood appears not anxious. Cognition and memory are normal. She does not exhibit a depressed mood.          Assessment & Plan:

## 2013-08-29 NOTE — Assessment & Plan Note (Signed)
Neg UA and neg U preg. Doubt ectopic preg, no tenderness abdominally suggestive of ovarian cyst.  Most likely due to Depo and menses restarting .   NSAIDs and time recommended.

## 2013-09-02 ENCOUNTER — Ambulatory Visit: Payer: Commercial Managed Care - PPO | Admitting: Family Medicine

## 2013-09-02 DIAGNOSIS — Z0289 Encounter for other administrative examinations: Secondary | ICD-10-CM

## 2013-09-05 ENCOUNTER — Ambulatory Visit (INDEPENDENT_AMBULATORY_CARE_PROVIDER_SITE_OTHER): Payer: Commercial Managed Care - PPO | Admitting: Family Medicine

## 2013-09-05 ENCOUNTER — Encounter: Payer: Self-pay | Admitting: Family Medicine

## 2013-09-05 VITALS — BP 138/82 | HR 73 | Temp 98.0°F | Wt 231.0 lb

## 2013-09-05 DIAGNOSIS — N898 Other specified noninflammatory disorders of vagina: Secondary | ICD-10-CM

## 2013-09-05 DIAGNOSIS — N920 Excessive and frequent menstruation with regular cycle: Secondary | ICD-10-CM

## 2013-09-05 MED ORDER — MEDROXYPROGESTERONE ACETATE 5 MG PO TABS: ORAL_TABLET | ORAL | Status: DC

## 2013-09-05 NOTE — Progress Notes (Signed)
Subjective:    Patient ID: Grace Gallagher, female    DOB: [birth year], 42 y.o.   MRN: [id number]  HPI 42 year old pleasant female here for 2 weeks of vaginal bleeding.  Received first IM depo shot for birth control on 07/01/2013.  Had no spotting or menses since until two weeks ago.  Had a lot of cramping which has improved but bleeding is now heavier- has "blood clots" and has to change multiple tampons per day.  She is concerned because at this point, it does not feel like spotting.  Saw Dr. Ermalene Searing for this on 9/25- note reviewed.  UA and Upreg negative.  Due for second depo provera injection on 09/16/2013.    She is sexually active.  She has no new partners. She has only had on monogomous partner for awhile  Patient Active Problem List   Diagnosis Date Noted  . Bilateral lower abdominal cramping 08/29/2013  . Spotting 08/29/2013  . Unspecified contraceptive management 07/01/2013  . Encounter for routine gynecological examination 07/01/2013  . Headache(784.0) 03/31/2013  . HTN (hypertension) 02/21/2013  . Obesity, unspecified 02/21/2013  . Microcytic anemia 02/21/2013   Past Medical History  Diagnosis Date  . Hypertension   . Anemia    Past Surgical History  Procedure Laterality Date  . Foot surgery    . Tubal ligation    . Breast surgery  08/2011    breast reduction   History  Substance Use Topics  . Smoking status: Never Smoker   . Smokeless tobacco: Not on file  . Alcohol Use: No   Family History  Problem Relation Age of Onset  . Hypertension Mother    Allergies  Allergen Reactions  . Hydrocodone Nausea Only   Current Outpatient Prescriptions on File Prior to Visit  Medication Sig Dispense Refill  . ferrous sulfate 325 (65 FE) MG tablet Take 325 mg by mouth 2 (two) times daily.      . fluticasone (FLONASE) 50 MCG/ACT nasal spray Place 2 sprays into the nose daily as needed for rhinitis.      Marland Kitchen lisinopril (PRINIVIL,ZESTRIL) 20 MG tablet Take 1 tablet (20  mg total) by mouth daily.  90 tablet  3   No current facility-administered medications on file prior to visit.   The PMH, PSH, Social History, Family History, Medications, and allergies have been reviewed in [facility], and have been updated if relevant.   Review of Systems  See HPI Constitutional: Negative for fever and fatigue.     Objective:   Physical Exam  BP 138/82  Pulse 73  Temp(Src) 98 F (36.7 C) (Oral)  Wt 231 lb (104.781 kg)  BMI 38.72 kg/m2  SpO2 98%  Constitutional: Vital signs are normal. She appears well-developed and well-nourished. She is cooperative.  Non-toxic appearance. She does not appear ill. No distress.  HENT:  Head: Normocephalic.  Right Ear: Hearing, tympanic membrane, external ear and ear canal normal. Tympanic membrane is not erythematous, not retracted and not bulging.  Left Ear: Hearing, tympanic membrane, external ear and ear canal normal. Tympanic membrane is not erythematous, not retracted and not bulging.  Nose: No mucosal edema or rhinorrhea. Right sinus exhibits no maxillary sinus tenderness and no frontal sinus tenderness. Left sinus exhibits no maxillary sinus tenderness and no frontal sinus tenderness.  Mouth/Throat: Uvula is midline, oropharynx is clear and moist and mucous membranes are normal.  Eyes: Conjunctivae, EOM and lids are normal. Pupils are equal, round, and reactive to light. Lids are everted and  swept, no foreign bodies found.  Neck: Trachea normal and normal range of motion. Neck supple. Carotid bruit is not present. No mass and no thyromegaly present.  Cardiovascular: Normal rate, regular rhythm, S1 normal, S2 normal, normal heart sounds, intact distal pulses and normal pulses.  Exam reveals no gallop and no friction rub.   No murmur heard. Pulmonary/Chest: Effort normal and breath sounds normal. Not tachypneic. No respiratory distress. She has no decreased breath sounds. She has no wheezes. She has no rhonchi. She has no rales.   Abdominal: Soft. Normal appearance and bowel sounds are normal. There is no tenderness. There is no CVA tenderness.  Neurological: She is alert.  Skin: Skin is warm, dry and intact. No rash noted.  Psychiatric: Her speech is normal and behavior is normal. Judgment and thought content normal. Her mood appears not anxious. Cognition and memory are normal. She does not exhibit a depressed mood.          Assessment & Plan:  1. Spotting Deteriorated, likely due to new contraceptive (depo). Will give oral provera x 5 days to help slow the bleeding.  If stops, will order [place] for further evaluation. Advised to keep appt for depo provera in a couple of weeks. Reassurance provided. She will call me if she develops any new or worsening symptoms.

## 2013-09-05 NOTE — Patient Instructions (Addendum)
Good to see you. Take provera 5 mg daily x 5 days. Make sure you come in on October 13th for your depo shot.  Let me know then how your bleeding is.  We may need to get an ultrasound.  Hang in there.

## 2013-09-09 ENCOUNTER — Telehealth: Payer: Self-pay

## 2013-09-09 NOTE — Telephone Encounter (Signed)
Pt seen 09/05/13; pt will take last Provera tab today; cramping is better but still heavy bleeding for 4 weeks. Pt said had discussed with Dr Dayton Martes about getting Depo shot early; pt request to go ahead and get Depo shot to see if will help stop bleeding. Pt request cb. [facility] [address]

## 2013-09-09 NOTE — Telephone Encounter (Signed)
Pt informed. Verbalized understanding.

## 2013-09-09 NOTE — Telephone Encounter (Signed)
We really need to wait until her scheduled depo appointment next week since she just took oral provera. If she would like, we can refer her to a GYN if she would prefer a specialist to manage this.

## 2013-09-18 ENCOUNTER — Ambulatory Visit: Payer: Commercial Managed Care - PPO

## 2013-09-19 ENCOUNTER — Ambulatory Visit (INDEPENDENT_AMBULATORY_CARE_PROVIDER_SITE_OTHER): Payer: Commercial Managed Care - PPO | Admitting: *Deleted

## 2013-09-19 DIAGNOSIS — Z309 Encounter for contraceptive management, unspecified: Secondary | ICD-10-CM

## 2013-09-19 MED ORDER — MEDROXYPROGESTERONE ACETATE 150 MG/ML IM SUSP
150.0000 mg | Freq: Once | INTRAMUSCULAR | Status: AC
  Administered 2013-09-19: 150 mg via INTRAMUSCULAR

## 2013-10-07 ENCOUNTER — Ambulatory Visit: Payer: Commercial Managed Care - PPO | Admitting: Internal Medicine

## 2013-10-07 DIAGNOSIS — Z0289 Encounter for other administrative examinations: Secondary | ICD-10-CM

## 2013-11-06 ENCOUNTER — Encounter: Payer: Self-pay | Admitting: Family Medicine

## 2013-11-06 ENCOUNTER — Ambulatory Visit (INDEPENDENT_AMBULATORY_CARE_PROVIDER_SITE_OTHER): Payer: Commercial Managed Care - PPO | Admitting: Family Medicine

## 2013-11-06 VITALS — BP 144/100 | HR 85 | Temp 98.7°F | Ht 64.75 in | Wt 237.8 lb

## 2013-11-06 DIAGNOSIS — B9689 Other specified bacterial agents as the cause of diseases classified elsewhere: Secondary | ICD-10-CM

## 2013-11-06 DIAGNOSIS — Z209 Contact with and (suspected) exposure to unspecified communicable disease: Secondary | ICD-10-CM

## 2013-11-06 DIAGNOSIS — A499 Bacterial infection, unspecified: Secondary | ICD-10-CM

## 2013-11-06 DIAGNOSIS — N926 Irregular menstruation, unspecified: Secondary | ICD-10-CM

## 2013-11-06 DIAGNOSIS — N760 Acute vaginitis: Secondary | ICD-10-CM

## 2013-11-06 DIAGNOSIS — Z7721 Contact with and (suspected) exposure to potentially hazardous body fluids: Secondary | ICD-10-CM

## 2013-11-06 LAB — POCT WET PREP (WET MOUNT)

## 2013-11-06 MED ORDER — METRONIDAZOLE 500 MG PO TABS: 500.0000 mg | ORAL_TABLET | Freq: Two times a day (BID) | ORAL | Status: DC

## 2013-11-06 NOTE — Progress Notes (Signed)
Date:  11/06/2013   Name:  Grace Gallagher   DOB:  [birth year]   MRN:  [id number] Gender: female Age: 42 y.o.  Primary Physician:  Ruthe Mannan, MD   Chief Complaint: Vaginal Bleeding and STD Testing   Subjective:   History of Present Illness:  Grace Gallagher is a 42 y.o. very pleasant female patient who presents with the following:  In September, depo shot, then started to bleed. Sometimes with urination will see it also. Bleeding. Her bleeding lessened and stopped only for minimal number of days after her 5 day course of Provera. She continues to have bleeding essentially every day. She recently got her 2nd Depo-Provera shot. She reports that the Provera tablets actually made it worse somewhat.  Underlying, one of the primary reason she is here today as she found that her boyfriend had been cheating on her a few weeks ago and she wanted to get tested for STDs.  Provera tablets made it worse. Depo stopped for a few days.   Still menstruating.  She is active. Boyfriend - was seeing someone else.   Past Medical History, Surgical History, Social History, Family History, Problem List, Medications, and Allergies have been reviewed and updated if relevant.  Review of Systems:  GEN: No acute illnesses, no fevers, chills. GI: No n/v/d, eating normally Pulm: No SOB Interactive and getting along well at home.  Otherwise, ROS is as per the HPI.  Objective:   Physical Examination: BP 144/100  Pulse 85  Temp(Src) 98.7 F (37.1 C) (Oral)  Ht 5' 4.75" (1.645 m)  Wt 237 lb 12 oz (107.843 kg)  BMI 39.85 kg/m2   GEN: WDWN, NAD, Non-toxic, A & O x 3 HEENT: Atraumatic, Normocephalic. Neck supple. No masses, No LAD. Ears and Nose: No external deformity. CV: RRR, No M/G/R. No JVD. No thrill. No extra heart sounds. PULM: CTA B, no wheezes, crackles, rhonchi. No retractions. No resp. distress. No accessory muscle use. GU: there is bleeding at the introitus. Examination chaperoned by Terese Door. Positive whiff test. Full speculum exam not done. EXTR: No c/c/e NEURO Normal gait.  PSYCH: Normally interactive. Conversant. Not depressed or anxious appearing.  Calm demeanor.   Laboratory and Imaging Data: Results for orders placed in visit on 11/06/13  POCT URINE PREGNANCY      Result Value Range   Preg Test, Ur Negative    POCT WET PREP (WET [place])      Result Value Range   Source Wet Prep POC vaginal     WBC, Wet Prep HPF POC 1-5     Bacteria Wet Prep HPF POC 3+     BACTERIA WET PREP MORPHOLOGY POC       Clue Cells Wet Prep HPF POC Few     CLUE CELLS WET PREP WHIFF POC       Yeast Wet Prep HPF POC None     KOH Wet Prep POC       Trichomonas Wet Prep HPF POC None       Assessment & Plan:    Irregular bleeding - Plan: POCT urine pregnancy, GC/chlamydia probe amp, urine, POCT Wet Prep ([place]et [place]), CANCELED: Urinalysis, microscopic only  Exposure to communicable disease - Plan: Urinalysis, Routine w reflex microscopic, HIV antibody, RPR  Hx of exposure to hazardous bodily fluids - Plan: Urinalysis, Routine w reflex microscopic, HIV antibody, RPR  Bacterial vaginosis  Sexually transmitted disease check and consistent with BV. We'll treat with Flagyl. She would like  to change birth control when depo runs out.  There are no Patient Instructions on file for this visit.  Orders Today:  Orders Placed This Encounter  Procedures  . GC/chlamydia probe amp, urine  . Urinalysis, Routine w reflex microscopic  . HIV antibody  . RPR  . POCT urine pregnancy  . POCT Wet Prep [facility])    New medications, updates to list, dose adjustments: Meds ordered this encounter  Medications  . metroNIDAZOLE (FLAGYL) 500 MG tablet    Sig: Take 1 tablet (500 mg total) by mouth 2 (two) times daily.    Dispense:  14 tablet    Refill:  0    Signed,  Joushua Dugar T. Pharaoh Pio, MD, CAQ Sports Medicine  [facility] [address] [place]  [address] Phone: [phone] Fax: [phone]  Updated Complete Medication List:   Medication List       This list is accurate as of: 11/06/13 11:59 PM.  Always use your most recent med list.               ferrous sulfate 325 (65 FE) MG tablet  Take 325 mg by mouth 2 (two) times daily.     fluticasone 50 MCG/ACT nasal spray  Commonly known as:  FLONASE  Place 2 sprays into the nose daily as needed for rhinitis.     lisinopril 20 MG tablet  Commonly known as:  PRINIVIL,ZESTRIL  Take 1 tablet (20 mg total) by mouth daily.     metroNIDAZOLE 500 MG tablet  Commonly known as:  FLAGYL  Take 1 tablet (500 mg total) by mouth 2 (two) times daily.

## 2013-11-06 NOTE — Progress Notes (Signed)
Pre-visit discussion using our clinic review tool. No additional management support is needed unless otherwise documented below in the visit note.  

## 2013-11-07 ENCOUNTER — Other Ambulatory Visit (INDEPENDENT_AMBULATORY_CARE_PROVIDER_SITE_OTHER): Payer: Commercial Managed Care - PPO

## 2013-11-07 DIAGNOSIS — Z113 Encounter for screening for infections with a predominantly sexual mode of transmission: Secondary | ICD-10-CM

## 2013-11-07 LAB — URINALYSIS, ROUTINE W REFLEX MICROSCOPIC
Nitrite: NEGATIVE
Specific Gravity, Urine: >=1.03 (ref 1.000–1.030)
Urine Glucose: NEGATIVE
Urobilinogen, UA: 0.2 (ref 0.0–1.0)

## 2013-11-07 LAB — GC/CHLAMYDIA PROBE AMP, URINE
Chlamydia, Swab/Urine, PCR: NEGATIVE
GC Probe Amp, Urine: NEGATIVE

## 2013-11-08 ENCOUNTER — Encounter (HOSPITAL_COMMUNITY): Payer: Self-pay | Admitting: Emergency Medicine

## 2013-11-08 ENCOUNTER — Emergency Department (HOSPITAL_COMMUNITY)
Admission: EM | Admit: 2013-11-08 | Discharge: 2013-11-08 | Discharge status: Against Medical Advice | Payer: Commercial Managed Care - PPO | Attending: Emergency Medicine | Admitting: Emergency Medicine

## 2013-11-08 DIAGNOSIS — I10 Essential (primary) hypertension: Secondary | ICD-10-CM | POA: Insufficient documentation

## 2013-11-08 DIAGNOSIS — R51 Headache: Secondary | ICD-10-CM | POA: Insufficient documentation

## 2013-11-08 NOTE — ED Notes (Signed)
Per pt hypertension for a few days. sts she has been taking her BP meds. sts HA

## 2013-12-06 ENCOUNTER — Emergency Department (HOSPITAL_COMMUNITY)
Admission: EM | Admit: 2013-12-06 | Discharge: 2013-12-06 | Discharge status: Against Medical Advice | Payer: Commercial Managed Care - PPO | Attending: Emergency Medicine | Admitting: Emergency Medicine

## 2013-12-06 ENCOUNTER — Encounter (HOSPITAL_COMMUNITY): Payer: Self-pay | Admitting: Emergency Medicine

## 2013-12-06 DIAGNOSIS — F430 Acute stress reaction: Secondary | ICD-10-CM | POA: Insufficient documentation

## 2013-12-06 DIAGNOSIS — I10 Essential (primary) hypertension: Secondary | ICD-10-CM | POA: Insufficient documentation

## 2013-12-06 DIAGNOSIS — R51 Headache: Secondary | ICD-10-CM | POA: Insufficient documentation

## 2013-12-06 NOTE — ED Notes (Signed)
Pt states that she had a "stressful" day at work and began to have a headache around 1:30pm; pt states that she felt her BP was elevated at work (pt reports 158 /100); pt states that she has taken her BP medication but that her headache continues and her BP remains elevated.; pt denies blurry or double vison

## 2013-12-06 NOTE — ED Notes (Addendum)
Called Pt x 2.

## 2014-01-04 ENCOUNTER — Encounter (HOSPITAL_COMMUNITY): Payer: Self-pay | Admitting: Emergency Medicine

## 2014-01-04 ENCOUNTER — Emergency Department (HOSPITAL_COMMUNITY): Payer: Commercial Managed Care - PPO

## 2014-01-04 ENCOUNTER — Emergency Department (HOSPITAL_COMMUNITY)
Admission: EM | Admit: 2014-01-04 | Discharge: 2014-01-04 | Disposition: A | Discharge status: Home / Self Care | Payer: Commercial Managed Care - PPO | Attending: Emergency Medicine | Admitting: Emergency Medicine

## 2014-01-04 DIAGNOSIS — Z79899 Other long term (current) drug therapy: Secondary | ICD-10-CM | POA: Insufficient documentation

## 2014-01-04 DIAGNOSIS — D649 Anemia, unspecified: Secondary | ICD-10-CM | POA: Insufficient documentation

## 2014-01-04 DIAGNOSIS — M546 Pain in thoracic spine: Secondary | ICD-10-CM | POA: Insufficient documentation

## 2014-01-04 DIAGNOSIS — I10 Essential (primary) hypertension: Secondary | ICD-10-CM | POA: Insufficient documentation

## 2014-01-04 DIAGNOSIS — M549 Dorsalgia, unspecified: Secondary | ICD-10-CM

## 2014-01-04 DIAGNOSIS — IMO0002 Reserved for concepts with insufficient information to code with codable children: Secondary | ICD-10-CM | POA: Insufficient documentation

## 2014-01-04 MED ORDER — IBUPROFEN 800 MG PO TABS
800.0000 mg | ORAL_TABLET | Freq: Once | ORAL | Status: AC
  Administered 2014-01-04: 800 mg via ORAL
  Filled 2014-01-04: qty 1

## 2014-01-04 MED ORDER — IBUPROFEN 800 MG PO TABS: 800.0000 mg | ORAL_TABLET | Freq: Three times a day (TID) | ORAL | Status: DC

## 2014-01-04 MED ORDER — METHOCARBAMOL 500 MG PO TABS: 500.0000 mg | ORAL_TABLET | Freq: Two times a day (BID) | ORAL | Status: DC

## 2014-01-04 NOTE — Discharge Instructions (Signed)
Back Pain, Adult Low back pain is very common. About 1 in 5 people have back pain.The cause of low back pain is rarely dangerous. The pain often gets better over time.About half of people with a sudden onset of back pain feel better in just 2 weeks. About 8 in 10 people feel better by 6 weeks.  CAUSES Some common causes of back pain include:  Strain of the muscles or ligaments supporting the spine.  Wear and tear (degeneration) of the spinal discs.  Arthritis.  Direct injury to the back. DIAGNOSIS Most of the time, the direct cause of low back pain is not known.However, back pain can be treated effectively even when the exact cause of the pain is unknown.Answering your caregiver's questions about your overall health and symptoms is one of the most accurate ways to make sure the cause of your pain is not dangerous. If your caregiver needs more information, he or she may order lab work or imaging tests (X-rays or MRIs).However, even if imaging tests show changes in your back, this usually does not require surgery. HOME CARE INSTRUCTIONS For many people, back pain returns.Since low back pain is rarely dangerous, it is often a condition that people can learn to manageon their own.   Remain active. It is stressful on the back to sit or stand in one place. Do not sit, drive, or stand in one place for more than 30 minutes at a time. Take short walks on level surfaces as soon as pain allows.Try to increase the length of time you walk each day.  Do not stay in bed.Resting more than 1 or 2 days can delay your recovery.  Do not avoid exercise or work.Your body is made to move.It is not dangerous to be active, even though your back may hurt.Your back will likely heal faster if you return to being active before your pain is gone.  Pay attention to your body when you bend and lift. Many people have less discomfortwhen lifting if they bend their knees, keep the load close to their bodies,and  avoid twisting. Often, the most comfortable positions are those that put less stress on your recovering back.  Find a comfortable position to sleep. Use a firm mattress and lie on your side with your knees slightly bent. If you lie on your back, put a pillow under your knees.  Only take over-the-counter or prescription medicines as directed by your caregiver. Over-the-counter medicines to reduce pain and inflammation are often the most helpful.Your caregiver may prescribe muscle relaxant drugs.These medicines help dull your pain so you can more quickly return to your normal activities and healthy exercise.  Put ice on the injured area.  Put ice in a plastic bag.  Place a towel between your skin and the bag.  Leave the ice on for 15-20 minutes, 03-04 times a day for the first 2 to 3 days. After that, ice and heat may be alternated to reduce pain and spasms.  Ask your caregiver about trying back exercises and gentle massage. This may be of some benefit.  Avoid feeling anxious or stressed.Stress increases muscle tension and can worsen back pain.It is important to recognize when you are anxious or stressed and learn ways to manage it.Exercise is a great option. SEEK MEDICAL CARE IF:  You have pain that is not relieved with rest or medicine.  You have pain that does not improve in 1 week.  You have new symptoms.  You are generally not feeling well. SEEK   IMMEDIATE MEDICAL CARE IF:   You have pain that radiates from your back into your legs.  You develop new bowel or bladder control problems.  You have unusual weakness or numbness in your arms or legs.  You develop nausea or vomiting.  You develop abdominal pain.  You feel faint. Document Released: 11/21/2005 Document Revised: 05/22/2012 Document Reviewed: 04/11/2011 Exit[employer] Patient Information 2014 [employer].  

## 2014-01-04 NOTE — ED Provider Notes (Signed)
Medical screening examination/treatment/procedure(s) were performed by non-physician practitioner and as supervising physician I was immediately available for consultation/collaboration.  EKG Interpretation   None        Orlie Dakin, MD 01/04/14 2132

## 2014-01-04 NOTE — ED Provider Notes (Signed)
CSN: [id number]     Arrival date & time 01/04/14  1534 History   First MD Initiated Contact with Patient 01/04/14 1600     Chief Complaint  Patient presents with  . Back Pain    pain in l/scapula with inspiration   (Consider location/radiation/quality/duration/timing/severity/associated sxs/prior Treatment) Patient is a 43 y.o. female presenting with back pain. The history is provided by the patient. No language interpreter was used.  Back Pain Location:  Thoracic spine Quality:  Aching Radiates to:  L shoulder Pain severity:  Moderate Onset quality:  Gradual Duration:  2 days Timing:  Constant Progression:  Worsening Context: lifting heavy objects   Relieved by:  Nothing Worsened by:  Nothing tried Ineffective treatments:  None tried Associated symptoms: no chest pain and no fever   Risk factors: not pregnant and no recent surgery     Past Medical History  Diagnosis Date  . Hypertension   . Anemia    Past Surgical History  Procedure Laterality Date  . Foot surgery    . Tubal ligation    . Breast surgery  08/2011    breast reduction   Family History  Problem Relation Age of Onset  . Hypertension Mother    History  Substance Use Topics  . Smoking status: Never Smoker   . Smokeless tobacco: Never Used  . Alcohol Use: No   OB History   Grav Para Term Preterm Abortions TAB SAB Ect Mult Living                 Review of Systems  Constitutional: Negative for fever.  Cardiovascular: Negative for chest pain.  Musculoskeletal: Positive for back pain.  All other systems reviewed and are negative.    Allergies  Hydrocodone  Home Medications   Current Outpatient Rx  Name  Route  Sig  Dispense  Refill  . ferrous sulfate 325 (65 FE) MG tablet   Oral   Take 325 mg by mouth 2 (two) times daily.         . fluticasone (FLONASE) 50 MCG/ACT nasal spray   Nasal   Place 2 sprays into the nose daily as needed for rhinitis.         Marland Kitchen lisinopril  (PRINIVIL,ZESTRIL) 20 MG tablet   Oral   Take 1 tablet (20 mg total) by mouth daily.   90 tablet   3    BP 150/97  Pulse 72  Temp(Src) 97.6 F (36.4 C) (Oral)  Resp 17  SpO2 99% Physical Exam  Constitutional: She is oriented to person, place, and time. She appears well-developed and well-nourished.  HENT:  Head: Normocephalic.  Right Ear: External ear normal.  Left Ear: External ear normal.  Nose: Nose normal.  Eyes: Pupils are equal, round, and reactive to light.  Neck: Normal range of motion.  Cardiovascular: Normal rate.   Pulmonary/Chest: Effort normal and breath sounds normal.  Tender below left scapula nd around scapula, seems muscular  Abdominal: Soft.  Musculoskeletal: Normal range of motion.  Neurological: She is alert and oriented to person, place, and time.  Skin: Skin is warm.  Psychiatric: She has a normal mood and affect.    ED Course  Procedures (including critical care time) Labs Review Labs Reviewed - No data to display Imaging Review Dg Chest 2 View  01/04/2014   CLINICAL DATA:  Shortness of breath and chest pain for 1 day  EXAM: CHEST  2 VIEW  COMPARISON:  July 03, 2012  FINDINGS: The heart  size and mediastinal contours are within normal limits. Both lungs are clear. The visualized skeletal structures are unremarkable.  IMPRESSION: No active cardiopulmonary disease.   Electronically Signed   By: Skipper Cliche M.D.   On: 01/04/2014 16:15    EKG Interpretation   None       MDM   1. Back pain    Ibuprofen and [place], [place] 01/04/14 1636

## 2014-01-04 NOTE — ED Notes (Signed)
Pt reports pain in l/scapula, pain increased with inspiration. Denies trauma or URI symptoms

## 2014-01-06 ENCOUNTER — Ambulatory Visit (INDEPENDENT_AMBULATORY_CARE_PROVIDER_SITE_OTHER): Payer: Commercial Managed Care - PPO | Admitting: Family Medicine

## 2014-01-06 ENCOUNTER — Encounter: Payer: Self-pay | Admitting: Family Medicine

## 2014-01-06 VITALS — BP 171/111 | HR 69 | Temp 97.9°F | Ht 64.25 in | Wt 233.5 lb

## 2014-01-06 DIAGNOSIS — I10 Essential (primary) hypertension: Secondary | ICD-10-CM

## 2014-01-06 DIAGNOSIS — M549 Dorsalgia, unspecified: Secondary | ICD-10-CM

## 2014-01-06 DIAGNOSIS — R0602 Shortness of breath: Secondary | ICD-10-CM

## 2014-01-06 LAB — D-DIMER, QUANTITATIVE (NOT AT ARMC): D DIMER QUANT: 0.33 ug{FEU}/mL (ref 0.00–0.48)

## 2014-01-06 MED ORDER — HYDROCHLOROTHIAZIDE 12.5 MG PO CAPS: 12.5000 mg | ORAL_CAPSULE | Freq: Every day | ORAL | Status: DC

## 2014-01-06 MED ORDER — LISINOPRIL 20 MG PO TABS: 20.0000 mg | ORAL_TABLET | Freq: Every day | ORAL | Status: DC

## 2014-01-06 NOTE — Assessment & Plan Note (Signed)
Deteriorated. Possibly due to pain but has been slowly increasing. Will add HCTZ 12.5 mg to her Lisinopril 20 mg daily. Follow up in 2 weeks.

## 2014-01-06 NOTE — Progress Notes (Signed)
Subjective:    Patient ID: Grace Gallagher, female    DOB: [birth year], 43 y.o.   MRN: [id number]  HPI  43 yo female here for back pain.  Was seen in ER for this complaint 3 days ago- note reviewed.   While she was there, complained of SOB and chest pain x 1 day. CXR was neg. Provider felt pain was reproducible- musculoskeletal and sent her home with Robaxin and Ibuprofen. 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  Dg Chest 2 View 01/04/2014   CLINICAL DATA:  Shortness of breath and chest pain for 1 day  EXAM: CHEST  2 VIEW  COMPARISON:  July 03, 2012  FINDINGS: The heart size and mediastinal contours are within normal limits. Both lungs are clear. The visualized skeletal structures are unremarkable.  IMPRESSION: No active cardiopulmonary disease.   Electronically Signed   By: Skipper Cliche M.D.   On: 01/04/2014 16:15   BP also very elevated- was only slightly elevated in ER.  Did take her lisinopril 20 mg this morning.  Does have a HA.  Feels shortness of breath mainly because when she takes deep breaths, sharp pain in her right upper back.   BP Readings from Last 3 Encounters:  01/06/14 171/111  01/04/14 150/106  12/06/13 153/104   Patient Active Problem List   Diagnosis Date Noted  . Back pain 01/06/2014  . Bilateral lower abdominal cramping 08/29/2013  . Spotting 08/29/2013  . Unspecified contraceptive management 07/01/2013  . Encounter for routine gynecological examination 07/01/2013  . Headache(784.0) 03/31/2013  . HTN (hypertension) 02/21/2013  . Obesity, unspecified 02/21/2013  . Microcytic anemia 02/21/2013   Past Medical History  Diagnosis Date  . Hypertension   . Anemia    Past Surgical History  Procedure Laterality Date  . Foot surgery    . Tubal ligation    . Breast surgery  08/2011    breast reduction   History  Substance Use Topics  . Smoking status: Never Smoker   . Smokeless tobacco: Never Used  . Alcohol Use: No   Family  History  Problem Relation Age of Onset  . Hypertension Mother    Allergies  Allergen Reactions  . Hydrocodone Nausea Only   Current Outpatient Prescriptions on File Prior to Visit  Medication Sig Dispense Refill  . ferrous sulfate 325 (65 FE) MG tablet Take 325 mg by mouth 2 (two) times daily.      . fluticasone (FLONASE) 50 MCG/ACT nasal spray Place 2 sprays into the nose daily as needed for rhinitis.      Marland Kitchen ibuprofen (ADVIL,MOTRIN) 800 MG tablet Take 1 tablet (800 mg total) by mouth 3 (three) times daily.  21 tablet  0  . methocarbamol (ROBAXIN) 500 MG tablet Take 1 tablet (500 mg total) by mouth 2 (two) times daily.  20 tablet  0   No current facility-administered medications on file prior to visit.   The PMH, PSH, Social History, Family History, Medications, and allergies have been reviewed in [facility], and have been updated if relevant.  Review of Systems See HPI No n/v/d No recent travel No longer on OCPs.    Objective:   Physical Exam BP 171/111  Pulse 69  Temp(Src) 97.9 F (36.6 C) (Oral)  Ht 5' 4.25" (1.632 m)  Wt 233 lb 8 oz (105.915 kg)  BMI 39.77 kg/m2  SpO2 99%  LMP 01/06/2014 Gen:  Alert, pleasant, appears uncomfortable but no increased WOB Resp:  CTA bilaterally CVS:  RRR Ext:  TTP over left scapula       Assessment & Plan:

## 2014-01-06 NOTE — Patient Instructions (Signed)
We are getting blood work done. Please continue the robaxin and Ibuprofen as needed. We are adding HCTZ 12.5 mg daily to you Lisinopril 20 mg daily.  Please come see me in 2 weeks.

## 2014-01-06 NOTE — Assessment & Plan Note (Signed)
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 Continue robaxin and ibuprofen as they are helping but I am also concerned about possibility (although low) of PE. Will check stat D dimer and other labs today. No labs done in ER. The patient indicates understanding of these issues and agrees with the plan.  Orders Placed This Encounter  Procedures  . D-dimer, quantitative  . Basic Metabolic Panel  . CBC with Differential

## 2014-01-06 NOTE — Progress Notes (Signed)
Pre-visit discussion using our clinic review tool. No additional management support is needed unless otherwise documented below in the visit note.  

## 2014-01-07 ENCOUNTER — Telehealth: Payer: Self-pay | Admitting: Family Medicine

## 2014-01-07 LAB — CBC WITH DIFFERENTIAL/PLATELET
BASOS PCT: 0.5 % (ref 0.0–3.0)
Basophils Absolute: 0 10*3/uL (ref 0.0–0.1)
EOS ABS: 0.2 10*3/uL (ref 0.0–0.7)
EOS PCT: 4.8 % (ref 0.0–5.0)
HCT: 37.2 % (ref 36.0–46.0)
Hemoglobin: 11.6 g/dL — ABNORMAL LOW (ref 12.0–15.0)
LYMPHS PCT: 38.5 % (ref 12.0–46.0)
Lymphs Abs: 1.7 10*3/uL (ref 0.7–4.0)
MCHC: 31 g/dL (ref 30.0–36.0)
MCV: 76.6 fl — ABNORMAL LOW (ref 78.0–100.0)
MONOS PCT: 9 % (ref 3.0–12.0)
Monocytes Absolute: 0.4 10*3/uL (ref 0.1–1.0)
NEUTROS PCT: 47.2 % (ref 43.0–77.0)
Neutro Abs: 2 10*3/uL (ref 1.4–7.7)
Platelets: 222 10*3/uL (ref 150.0–400.0)
RBC: 4.86 Mil/uL (ref 3.87–5.11)
RDW: 15.4 % — ABNORMAL HIGH (ref 11.5–14.6)
WBC: 4.3 10*3/uL — AB (ref 4.5–10.5)

## 2014-01-07 LAB — BASIC METABOLIC PANEL
BUN: 7 mg/dL (ref 6–23)
CALCIUM: 9 mg/dL (ref 8.4–10.5)
CO2: 24 mEq/L (ref 19–32)
CREATININE: 0.8 mg/dL (ref 0.4–1.2)
Chloride: 110 mEq/L (ref 96–112)
GFR: 102.52 mL/min (ref >60.00)
Glucose, Bld: 67 mg/dL — ABNORMAL LOW (ref 70–99)
Potassium: 3.3 mEq/L — ABNORMAL LOW (ref 3.5–5.1)
Sodium: 140 mEq/L (ref 135–145)

## 2014-01-07 NOTE — Telephone Encounter (Signed)
Relevant patient education assigned to patient using Emmi. ° °

## 2014-01-08 ENCOUNTER — Emergency Department (HOSPITAL_COMMUNITY)
Admission: EM | Admit: 2014-01-08 | Discharge: 2014-01-08 | Disposition: A | Discharge status: Home / Self Care | Payer: Commercial Managed Care - PPO | Attending: Emergency Medicine | Admitting: Emergency Medicine

## 2014-01-08 ENCOUNTER — Encounter (HOSPITAL_COMMUNITY): Payer: Self-pay | Admitting: Emergency Medicine

## 2014-01-08 ENCOUNTER — Telehealth: Payer: Self-pay | Admitting: Family Medicine

## 2014-01-08 DIAGNOSIS — E876 Hypokalemia: Secondary | ICD-10-CM

## 2014-01-08 DIAGNOSIS — R209 Unspecified disturbances of skin sensation: Secondary | ICD-10-CM | POA: Insufficient documentation

## 2014-01-08 DIAGNOSIS — Z79899 Other long term (current) drug therapy: Secondary | ICD-10-CM | POA: Insufficient documentation

## 2014-01-08 DIAGNOSIS — D649 Anemia, unspecified: Secondary | ICD-10-CM | POA: Insufficient documentation

## 2014-01-08 DIAGNOSIS — IMO0002 Reserved for concepts with insufficient information to code with codable children: Secondary | ICD-10-CM | POA: Insufficient documentation

## 2014-01-08 DIAGNOSIS — I10 Essential (primary) hypertension: Secondary | ICD-10-CM

## 2014-01-08 LAB — POCT I-STAT, CHEM 8
BUN: 14 mg/dL (ref 6–23)
CHLORIDE: 101 meq/L (ref 96–112)
Calcium, Ion: 1.27 mmol/L — ABNORMAL HIGH (ref 1.12–1.23)
Creatinine, Ser: 0.9 mg/dL (ref 0.50–1.10)
GLUCOSE: 88 mg/dL (ref 70–99)
HEMATOCRIT: 40 % (ref 36.0–46.0)
Hemoglobin: 13.6 g/dL (ref 12.0–15.0)
POTASSIUM: 3.6 meq/L — AB (ref 3.7–5.3)
Sodium: 140 mEq/L (ref 137–147)
TCO2: 25 mmol/L (ref 0–100)

## 2014-01-08 MED ORDER — POTASSIUM CHLORIDE CRYS ER 20 MEQ PO TBCR
40.0000 meq | EXTENDED_RELEASE_TABLET | Freq: Once | ORAL | Status: AC
  Administered 2014-01-08: 40 meq via ORAL
  Filled 2014-01-08: qty 2

## 2014-01-08 NOTE — Telephone Encounter (Signed)
Patient Information:  Caller Name: [place]  Phone: [phone]  Patient: Grace Gallagher, Grace Gallagher  Gender: Female  DOB: 1971/09/04  Age: 43 Years  PCP: [facility])  Pregnant: No  Office Follow Up:  Does the office need to follow up with this patient?: No  Instructions For The Office: N/A   Symptoms  Reason For Call & Symptoms: 01/06/14 seen in office by Dr Deborra Medina, adjustments to BP meds made. 01/07/14 left sided headache onset at night.    01/08/14 1500 BP's 150/104, 160/105.  C/o left arm numbness onset at 1400.   Left sided headache continues.  No chest pain.  Mild shortness of breath.  Spoke with Earl Lagos, CMA at office due to ER disposition, advised for pt to go to the ER for further eval of sxs.   Pt notified that she would need to go to the ER, recommended [facility] but pt states she will go to [facility].  Reviewed Health History In EMR: N/A  Reviewed Medications In EMR: N/A  Reviewed Allergies In EMR: N/A  Reviewed Surgeries / Procedures: N/A  Date of Onset of Symptoms: 01/06/2014 OB / GYN:  LMP: Unknown  Guideline(s) Used:  High Blood Pressure  Disposition Per Guideline:   Go to ED Now (or to Office with PCP Approval)  Reason For Disposition Reached:   BP > 160 / 100 and any cardiac or neurologic symptoms (e.g., chest pain, difficulty breathing, unsteady gait, blurred vision)  Advice Given:  N/A  RN Overrode Recommendation:  Go To ED  Per Earl Lagos, CMA for Dr Deborra Medina

## 2014-01-08 NOTE — ED Provider Notes (Signed)
CSN: [id number]     Arrival date & time 01/08/14  2019 History   First MD Initiated Contact with Patient 01/08/14 2138     Chief Complaint  Patient presents with  . Hypertension   (Consider location/radiation/quality/duration/timing/severity/associated sxs/prior Treatment) HPI Grace Gallagher is a 43 y.o. female who presents to emergency department complaining of elevated blood pressures at home. She states her blood pressure earlier today was 137/100, and once again she checked it was 160/100. Patient states she has history of hypertension. She saw her doctor 2 days ago and was started on hydrochlorothiazide in addition to herand in her lisinopril that  she already  takes.Patient states she is worried about her blood pressure. She states also earlier today she developed some tingling in the left hand. She states she called her primary care Dr. her told her to go to the ER. She denies any chest pain shortness of breath. She denies any new medications including supplements other than hydrochlorothiazide. She denies any headache. She denies any dizziness. She states she also was told by her doctor that her potassium is low today to go. She was not started on any potassium supplements.  Past Medical History  Diagnosis Date  . Hypertension   . Anemia    Past Surgical History  Procedure Laterality Date  . Foot surgery    . Tubal ligation    . Breast surgery  08/2011    breast reduction   Family History  Problem Relation Age of Onset  . Hypertension Mother    History  Substance Use Topics  . Smoking status: Never Smoker   . Smokeless tobacco: Never Used  . Alcohol Use: No   OB History   Grav Para Term Preterm Abortions TAB SAB Ect Mult Living                 Review of Systems  Constitutional: Negative for fever and chills.  Respiratory: Negative for cough, chest tightness and shortness of breath.   Cardiovascular: Negative for chest pain, palpitations and leg swelling.   Gastrointestinal: Negative for nausea, vomiting, abdominal pain and diarrhea.  Genitourinary: Negative for dysuria, flank pain, vaginal bleeding, vaginal discharge, vaginal pain and pelvic pain.  Musculoskeletal: Negative for arthralgias.  Skin: Negative for rash.  Neurological: Positive for numbness. Negative for dizziness, weakness and headaches.  All other systems reviewed and are negative.    Allergies  Hydrocodone  Home Medications   Current Outpatient Rx  Name  Route  Sig  Dispense  Refill  . ferrous sulfate 325 (65 FE) MG tablet   Oral   Take 325 mg by mouth 2 (two) times daily.         . fluticasone (FLONASE) 50 MCG/ACT nasal spray   Nasal   Place 2 sprays into the nose daily as needed for rhinitis.         . hydrochlorothiazide (MICROZIDE) 12.5 MG capsule   Oral   Take 1 capsule (12.5 mg total) by mouth daily.   30 capsule   1   . ibuprofen (ADVIL,MOTRIN) 800 MG tablet   Oral   Take 1 tablet (800 mg total) by mouth 3 (three) times daily.   21 tablet   0   . lisinopril (PRINIVIL,ZESTRIL) 20 MG tablet   Oral   Take 1 tablet (20 mg total) by mouth daily.   90 tablet   3   . methocarbamol (ROBAXIN) 500 MG tablet   Oral   Take 1 tablet (500 mg  total) by mouth 2 (two) times daily.   20 tablet   0    BP 129/89  Pulse 83  Temp(Src) 98.6 F (37 C) (Oral)  Resp 16  Ht 5\' 7"  (1.702 m)  Wt 232 lb (105.235 kg)  BMI 36.33 kg/m2  SpO2 98%  LMP 01/06/2014 Physical Exam  Nursing note and vitals reviewed. Constitutional: She appears well-developed and well-nourished. No distress.  HENT:  Head: Normocephalic.  Eyes: Conjunctivae are normal.  Neck: Neck supple.  Cardiovascular: Normal rate, regular rhythm and normal heart sounds.   Pulmonary/Chest: Effort normal and breath sounds normal. No respiratory distress. She has no wheezes. She has no rales.  Abdominal: Soft. Bowel sounds are normal. She exhibits no distension. There is no tenderness. There is no  rebound.  Musculoskeletal: She exhibits no edema.  Neurological: She is alert.  5/5 and equal upper extremity strength bilaterally. Sensation normal in bilateral extremities.   Skin: Skin is warm and dry.  Psychiatric: She has a normal mood and affect. Her behavior is normal.    ED Course  Procedures (including critical care time) Labs Review Labs Reviewed  POCT I-STAT, CHEM 8 - Abnormal; Notable for the following:    Potassium 3.6 (*)    Calcium, Ion 1.27 (*)    All other components within normal limits   Imaging Review No results found.  EKG Interpretation   None       Date: 01/08/2014  Rate: 76  Rhythm: normal sinus rhythm  QRS Axis: normal  Intervals: normal  ST/T Wave abnormalities: nonspecific ST/T changes  Conduction Disutrbances:none  Narrative Interpretation:   Old EKG Reviewed: unchanged   MDM   1. Hypertension   2. Hypokalemia     Patient in emergency department due to elevated blood pressure at home. Here her blood pressure is 129/89. 2 days ago and hydrochlorothiazide to her lisinopril. She is symptom free in emergency department. Chem-8 obtained due to low potassium in the office. Potassium here 3.6. I have given her 40 mEq by mouth prior to discharge. Have also discussed with her proper diet and not taking her blood pressure multiple times a day. Patient apparently has been checking her blood pressure every hour at home.  Filed Vitals:   01/08/14 2049 01/08/14 2327  BP: 129/89 134/98  Pulse: 83 78  Temp: 98.6 F (37 C)   TempSrc: Oral   Resp: 16   Height: 5\' 7"  (1.702 m)   Weight: 232 lb (105.235 kg)   SpO2: 98% 97%       Renold Genta, PA-C 01/08/14 [place] Grace Sublette, PA-C 01/08/14 2338

## 2014-01-08 NOTE — Telephone Encounter (Signed)
Agree with that advisement  

## 2014-01-08 NOTE — Discharge Instructions (Signed)
Continue regular BP medications. Follow up with your primary care doctor for recheck.   Managing Your High Blood Pressure Blood pressure is a measurement of how forceful your blood is pressing against the walls of the arteries. Arteries are muscular tubes within the circulatory system. Blood pressure does not stay the same. Blood pressure rises when you are active, excited, or nervous; and it lowers during sleep and relaxation. If the numbers measuring your blood pressure stay above normal most of the time, you are at risk for health problems. High blood pressure (hypertension) is a long-term (chronic) condition in which blood pressure is elevated. A blood pressure reading is recorded as two numbers, such as 120 over 80 (or 120/80). The first, higher number is called the systolic pressure. It is a measure of the pressure in your arteries as the heart beats. The second, lower number is called the diastolic pressure. It is a measure of the pressure in your arteries as the heart relaxes between beats.  Keeping your blood pressure in a normal range is important to your overall health and prevention of health problems, such as heart disease and stroke. When your blood pressure is uncontrolled, your heart has to work harder than normal. High blood pressure is a very common condition in adults because blood pressure tends to rise with age. Men and women are equally likely to have hypertension but at different times in life. Before age 40, men are more likely to have hypertension. After 43 years of age, women are more likely to have it. Hypertension is especially common in African Americans. This condition often has no signs or symptoms. The cause of the condition is usually not known. Your caregiver can help you come up with a plan to keep your blood pressure in a normal, healthy range. BLOOD PRESSURE STAGES Blood pressure is classified into four stages: normal, prehypertension, stage 1, and stage 2. Your blood  pressure reading will be used to determine what type of treatment, if any, is necessary. Appropriate treatment options are tied to these four stages:  Normal  Systolic pressure (mm Hg): below 120.  Diastolic pressure (mm Hg): below 80. Prehypertension  Systolic pressure (mm Hg): 120 to 139.  Diastolic pressure (mm Hg): 80 to 89. Stage1  Systolic pressure (mm Hg): 140 to 159.  Diastolic pressure (mm Hg): 90 to 99. Stage2  Systolic pressure (mm Hg): 160 or above.  Diastolic pressure (mm Hg): 100 or above. RISKS RELATED TO HIGH BLOOD PRESSURE Managing your blood pressure is an important responsibility. Uncontrolled high blood pressure can lead to:  A heart attack.  A stroke.  A weakened blood vessel (aneurysm).  Heart failure.  Kidney damage.  Eye damage.  Metabolic syndrome.  Memory and concentration problems. HOW TO MANAGE YOUR BLOOD PRESSURE Blood pressure can be managed effectively with lifestyle changes and medicines (if needed). Your caregiver will help you come up with a plan to bring your blood pressure within a normal range. Your plan should include the following: Education  Read all information provided by your caregivers about how to control blood pressure.  Educate yourself on the latest guidelines and treatment recommendations. New research is always being done to further define the risks and treatments for high blood pressure. Lifestylechanges  Control your weight.  Avoid smoking.  Stay physically active.  Reduce the amount of salt in your diet.  Reduce stress.  Control any chronic conditions, such as high cholesterol or diabetes.  Reduce your alcohol intake. Medicines  Several  medicines (antihypertensive medicines) are available, if needed, to bring blood pressure within a normal range. Communication  Review all the medicines you take with your caregiver because there may be side effects or interactions.  Talk with your caregiver  about your diet, exercise habits, and other lifestyle factors that may be contributing to high blood pressure.  See your caregiver regularly. Your caregiver can help you create and adjust your plan for managing high blood pressure. RECOMMENDATIONS FOR TREATMENT AND FOLLOW-UP  The following recommendations are based on current guidelines for managing high blood pressure in nonpregnant adults. Use these recommendations to identify the proper follow-up period or treatment option based on your blood pressure reading. You can discuss these options with your caregiver.  Systolic pressure of 161 to 096 or diastolic pressure of 80 to 89: Follow up with your caregiver as directed.  Systolic pressure of 045 to 409 or diastolic pressure of 90 to 100: Follow up with your caregiver within 2 months.  Systolic pressure above 811 or diastolic pressure above 914: Follow up with your caregiver within 1 month.  Systolic pressure above 782 or diastolic pressure above 956: Consider antihypertensive therapy; follow up with your caregiver within 1 week.  Systolic pressure above 213 or diastolic pressure above 086: Begin antihypertensive therapy; follow up with your caregiver within 1 week. Document Released: 08/15/2012 Document Reviewed: 08/15/2012 [facility] Patient Information 2014 [place], [place].

## 2014-01-08 NOTE — ED Notes (Signed)
Pt states she went to the dr on Monday and had her blood pressure medication changed  Pt states her blood pressure has remained high  Pt states she checked it prior to coming here and it was 137/100  Pt is c/o headache and her left side of her neck has pain and her left arm is numb  Pt states she called her dr and was told to come here

## 2014-01-09 NOTE — Telephone Encounter (Signed)
Spoke to pt who states that she did go to the ED on 01/08/14. She states that they "didn't do much." She states that her BP was "up and down" while there, but was 146/106 when discharged. She states that the was asked why she came to the ED as they do not treat hypertension. She reports that her K+ was a little low and she was given 2 K+ tabs while at ED. Pt states that she checked BP this am before taking meds and it was 141/101. She indicates that on yesterday she felt "really bad " and her entire left side was numb from left side of head, radiating to left arm and she felt "really dizzy."  She states that she has been just "lying around" this am. Pt is concerned as to why BP meds are not working. She has a f/u appt on 01/10/14 which I advised her to be sure to attend.

## 2014-01-09 NOTE — Telephone Encounter (Signed)
Please call to check up on pt. 

## 2014-01-09 NOTE — ED Provider Notes (Signed)
Medical screening examination/treatment/procedure(s) were performed by non-physician practitioner and as supervising physician I was immediately available for consultation/collaboration.  EKG Interpretation   None         Merryl Hacker, MD 01/09/14 [phone]

## 2014-01-09 NOTE — Telephone Encounter (Signed)
She actually just has lab appt scheduled- can you put her in my 2:30 slot (currently blocked and ok to open) - to re address her BP?  Advise her to take two of her 20 mg lisinopril tablets, along with one HCTZ 12.5 mg daily today and tomorrow.   Thanks.

## 2014-01-09 NOTE — Telephone Encounter (Signed)
Spoke to pt and advised on new medication frequency per Dr Deborra Medina. Appt slot for 1430 opened and pt added for BP f/u.

## 2014-01-09 NOTE — Telephone Encounter (Signed)
Thanks

## 2014-01-10 ENCOUNTER — Ambulatory Visit (INDEPENDENT_AMBULATORY_CARE_PROVIDER_SITE_OTHER): Payer: Commercial Managed Care - PPO | Admitting: Family Medicine

## 2014-01-10 ENCOUNTER — Encounter: Payer: Self-pay | Admitting: Family Medicine

## 2014-01-10 ENCOUNTER — Other Ambulatory Visit: Payer: Commercial Managed Care - PPO

## 2014-01-10 VITALS — BP 168/98 | HR 87 | Temp 98.0°F | Ht 64.0 in | Wt 232.8 lb

## 2014-01-10 DIAGNOSIS — I10 Essential (primary) hypertension: Secondary | ICD-10-CM

## 2014-01-10 DIAGNOSIS — E876 Hypokalemia: Secondary | ICD-10-CM

## 2014-01-10 MED ORDER — POTASSIUM CHLORIDE CRYS ER 10 MEQ PO TBCR: 10.0000 meq | EXTENDED_RELEASE_TABLET | Freq: Two times a day (BID) | ORAL | Status: DC

## 2014-01-10 MED ORDER — LISINOPRIL-HYDROCHLOROTHIAZIDE 20-12.5 MG PO TABS: 2.0000 | ORAL_TABLET | Freq: Every day | ORAL | Status: DC

## 2014-01-10 NOTE — Patient Instructions (Addendum)
Good to see you. We are increasing your medication to 40 mg of lisinopril, 25 mg of HCTZ and 10 meq of potassium. I sent a new prescription that combines the lisinopril and HCTZ ( you will take two of these tablets) but ok to double up on what you have at home until you run out. Please start potassium right away.  Please stop by to see Rosaria Ferries on your way out to set up your referral.  Potassium Content of Foods Potassium is a mineral found in many foods and drinks. It helps keep fluids and minerals balanced in your body and also affects how steadily your heart beats. The body needs potassium to control blood pressure and to keep the muscles and nervous system healthy. However, certain health conditions and medicine may require you to eat more or less potassium-rich foods and drinks. Your caregiver or dietitian will tell you how much potassium you should have each day. COMMON SERVING SIZES The list below tells you how big or small common portion sizes are:  1 oz.........4 stacked dice.  3 oz........Marland KitchenDeck of cards.  1 tsp.......Marland KitchenTip of little finger.  1 tbsp....Marland KitchenMarland KitchenThumb.  2 tbsp....Marland KitchenMarland KitchenGolf ball.   c..........Marland KitchenHalf of a fist.  1 c...........Marland KitchenA fist. FOODS AND DRINKS HIGH IN POTASSIUM More than 200 mg of potassium per serving. A serving size is  c (120 mL or noted gram weight) unless otherwise stated. While all the items on this list are high in potassium, some items are higher in potassium than others. Fruits  Apricots (sliced), 83 g.  Apricots (dried halves), 3 oz / 24 g.  Avocado (cubed),  c / 50 g.  Banana (sliced), 75 g.  Cantaloupe (cubed), 80 g.  Dates (pitted), 5 whole / 35 g.  Figs (dried), 4 whole / 32 g.  Guava, c / 55 g.  Honeydew, 1 wedge / 85 g.  Kiwi (sliced), 90 g.  Nectarine, 1 small / 129 g.  Orange, 1 medium / 131 g.  Orange juice.  Pomegranate seeds, 87 g.  Pomegranate juice.  Prunes (pitted), 3 whole / 30 g.  Prune juice, 3 oz / 90  mL.  Seedless raisins, 3 tbsp / 27 g. Vegetables  Artichoke,  of a medium / 64 g.  Asparagus (boiled), 90 g.  Baked beans,  c / 63 g.  Bamboo shoots,  c / 38 g.  Beets (cooked slices), 85 g.  Broccoli (boiled), 78 g.  Brussels sprout (boiled), 78 g.  Butternut squash (baked), 103 g.  Chickpea (cooked), 82 g.  Green peas (cooked), 80 g.  Hubbard squash (baked cubes),  c / 68 g.  Kidney beans (cooked), 5 tbsp / 55 g.  Lima beans (cooked),  c / 43 g.  Navy beans (cooked),  c / 61 g.  Potato (baked), 61 g.  Potato (boiled), 78 g.  Pumpkin (boiled), 123 g.  Refried beans,  c / 79 g.  Spinach (cooked),  c / 45 g.  Split peas (cooked),  c / 65 g.  Sun-dried tomatoes, 2 tbsp / 7 g.  Sweet potato (baked),  c / 50 g.  Tomato (chopped or sliced), 90 g.  Tomato juice.  Tomato paste, 4 tsp / 21 g.  Tomato sauce,  c / 61 g.  Vegetable juice.  White mushrooms (cooked), 78 g.  Yam (cooked or baked),  c / 34 g.  Zucchini squash (boiled), 90 g. Other Foods and Drinks  Almonds (whole),  c / 36 g.  Cashews (oil  roasted),  c / 32 g.  Chocolate milk.  Chocolate pudding, 142 g.  Clams (steamed), 1.5 oz / 43 g.  Dark chocolate, 1.5 oz / 42 g.  Fish, 3 oz / 85 g.  King crab (steamed), 3 oz / 85 g.  Lobster (steamed), 4 oz / 113 g.  Milk (skim, 1%, 2%, whole), 1 c / 240 mL.  Milk chocolate, 2.3 oz / 66 g.  Milk shake.  Nonfat fruit variety yogurt, 123 g.  Peanuts (oil roasted), 1 oz / 28 g.  Peanut butter, 2 tbsp / 32 g.  Pistachio nuts, 1 oz / 28 g.  Pumpkin seeds, 1 oz / 28 g.  Red meat (broiled, cooked, grilled), 3 oz / 85 g.  Scallops (steamed), 3 oz / 85 g.  Shredded wheat cereal (dry), 3 oblong biscuits / 75 g.  Spaghetti sauce,  c / 66 g.  Sunflower seeds (dry roasted), 1 oz / 28 g.  Veggie burger, 1 patty / 70 g. FOODS MODERATE IN POTASSIUM Between 150 mg and 200 mg per serving. A serving is  c (120 mL or noted  gram weight) unless otherwise stated. Fruits  Grapefruit,  of the fruit / 123 g.  Grapefruit juice.  Pineapple juice.  Plums (sliced), 83 g.  Tangerine, 1 large / 120 g. Vegetables  Carrots (boiled), 78 g.  Carrots (sliced), 61 g.  Rhubarb (cooked with sugar), 120 g.  R[place]aga (cooked), 120 g.  Sweet corn (cooked), 75 g.  Yellow snap beans (cooked), 63 g. Other Foods and Drinks   Bagel, 1 bagel / 98 g.  Chicken breast (roasted and chopped),  c / 70 g.  Chocolate ice cream / 66 g.  Pita bread, 1 large / 64 g.  Shrimp (steamed), 4 oz / 113 g.  Swiss cheese (diced), 70 g.  Vanilla ice cream, 66 g.  Vanilla pudding, 140 g. FOODS LOW IN POTASSIUM Less than 150 mg per serving. A serving size is  cup (120 mL or noted gram weight) unless otherwise stated. If you eat more than 1 serving of a food low in potassium, the food may be considered a food high in potassium. Fruits  Apple (slices), 55 g.  Apple juice.  Applesauce, 122 g.  Blackberries, 72 g.  Blueberries, 74 g.  Cranberries, 50 g.  Cranberry juice.  Fruit cocktail, 119 g.  Fruit punch.  Grapes, 46 g.  Grape juice.  Mandarin oranges (canned), 126 g.  Peach (slices), 77 g.  Pineapple (chunks), 83 g.  Raspberries, 62 g.  Red cherries (without pits), 78 g.  Strawberries (sliced), 83 g.  Watermelon (diced), 76 g. Vegetables  Alfalfa sprouts, 17 g.  Bell peppers (sliced), 46 g.  Cabbage (shredded), 35 g.  Cauliflower (boiled), 62 g.  Celery, 51 g.  Collard greens (boiled), 95 g.  Cucumber (sliced), 52 g.  Eggplant (cubed), 41 g.  Green beans (boiled), 63 g.  Lettuce (shredded), 1 c / 36 g.  Onions (sauteed), 44 g.  Radishes (sliced), 58 g.  Spaghetti squash, 51 g. Other Foods and Drinks  [employer], 1 slice / 28 g.  Black tea.  Brown rice (cooked), 98 g.  Butter croissant, 1 medium / 57 g.  Carbonated soda.  Coffee.  Cheddar cheese (diced), 66  g.  Corn flake cereal (dry), 14 g.  Cottage cheese, 118 g.  Cream of rice cereal (cooked), 122 g.  Cream of wheat cereal (cooked), 126 g.  Crisped rice cereal (dry),  14 g.  Egg (boiled, fried, poached, omelet, scrambled), 1 large / 46 61 g.  English muffin, 1 muffin / 57 g.  Frozen ice pop, 1 pop / 55 g.  Graham cracker, 1 large rectangular cracker / 14 g.  Jelly beans, 112 g.  Non-dairy whipped topping.  Oatmeal, 88 g.  Orange sherbet, 74 g.  Puffed rice cereal (dry), 7 g.  Pasta (cooked), 70 g.  Rice cakes, 4 cakes / 36 g.  Sugared doughnut, 4 oz / 116 g.  White bread, 1 slice / 30 g.  White rice (cooked), 79 93 g.  Wild rice (cooked), 82 g.  Yellow cake, 1 slice / 68 g. Document Released: 07/05/2005 Document Revised: 11/07/2012 Document Reviewed: 04/06/2012 [facility] Patient Information 2014 [place].

## 2014-01-10 NOTE — Progress Notes (Signed)
Pre-visit discussion using our clinic review tool. No additional management support is needed unless otherwise documented below in the visit note.  

## 2014-01-10 NOTE — Assessment & Plan Note (Signed)
Add Kdur. Will recheck labs at two week OV. Given list of potassium rich foods.

## 2014-01-10 NOTE — Assessment & Plan Note (Signed)
Remains poorly controlled. I too question readings in ER- from note, does not appear she was given any antihypertensives in ER. Will increase BP medication to lisinopril 40 mg and HCTZ 25 mg daily, add kdur 20 meq daily. Given recent deterioration in BP, will refer to cardiology for work up and management. ?renal artery [place] Follow up in two weeks if she has not seen cardiology yet. The patient indicates understanding of these issues and agrees with the plan.

## 2014-01-10 NOTE — Progress Notes (Signed)
Subjective:    Patient ID: Grace Gallagher, female    DOB: 08-08-1971, 43 y.o.   MRN: [id number]  HPI  43 yo female here for follow up HTN and ER follow up.  I saw her on 01/06/2014 for back pain and ER follow up.  Was seen in ER for this complaint 3 days ago- note reviewed.   Pain was likely MSK.  BP also very elevated- added HCTZ 12.5 mg daily to her previous dose of lisinopril and advised to follow up in 2 weeks. She called [facility] yesterday and complaint of very high BP with left arm tingling and was directed to go back to the ER.  Note reviewed- normotensive in ER but mildly hypokalemic.  She reports that those BPs may have been entered in error as she was told her BP was above 140/100 even when she was discharged.  Given Kdur 40 meq and discharged home to follow up with me today.  When we called to check on her yesterday, BP was again elevated, so I advised her to increase Lisinopril to 40 mg daily, continue HCTZ 12.5 mg and to follow up with me here today.  BP still elevated and having headaches.  Has home cuff at home- and has not been below 160/100 at home.    BP Readings from Last 3 Encounters:  01/10/14 168/98  01/08/14 134/98  01/06/14 171/111     Patient Active Problem List   Diagnosis Date Noted  . Hypokalemia 01/10/2014  . Back pain 01/06/2014  . Bilateral lower abdominal cramping 08/29/2013  . Spotting 08/29/2013  . Unspecified contraceptive management 07/01/2013  . Encounter for routine gynecological examination 07/01/2013  . Headache(784.0) 03/31/2013  . HTN (hypertension) 02/21/2013  . Obesity, unspecified 02/21/2013  . Microcytic anemia 02/21/2013   Past Medical History  Diagnosis Date  . Hypertension   . Anemia    Past Surgical History  Procedure Laterality Date  . Foot surgery    . Tubal ligation    . Breast surgery  08/2011    breast reduction   History  Substance Use Topics  . Smoking status: Never Smoker   . Smokeless tobacco: Never Used    . Alcohol Use: No   Family History  Problem Relation Age of Onset  . Hypertension Mother    Allergies  Allergen Reactions  . Hydrocodone Nausea Only   Current Outpatient Prescriptions on File Prior to Visit  Medication Sig Dispense Refill  . ferrous sulfate 325 (65 FE) MG tablet Take 325 mg by mouth 2 (two) times daily.      . fluticasone (FLONASE) 50 MCG/ACT nasal spray Place 2 sprays into the nose daily as needed for rhinitis.      Marland Kitchen ibuprofen (ADVIL,MOTRIN) 800 MG tablet Take 1 tablet (800 mg total) by mouth 3 (three) times daily.  21 tablet  0  . methocarbamol (ROBAXIN) 500 MG tablet Take 1 tablet (500 mg total) by mouth 2 (two) times daily.  20 tablet  0   No current facility-administered medications on file prior to visit.   The PMH, PSH, Social History, Family History, Medications, and allergies have been reviewed in [facility], and have been updated if relevant.  Review of Systems See HPI No blurred vision, CP or SOB    Objective:   Physical Exam BP 168/98  Pulse 87  Temp(Src) 98 F (36.7 C) (Oral)  Ht 5\' 4"  (1.626 m)  Wt 232 lb 12 oz (105.575 kg)  BMI 39.93 kg/m2  SpO2 97%  LMP 01/06/2014 Gen:  Alert, pleasant, NAD Resp:  CTA bilaterally CVS:  RRR Ext:  No edema     Assessment & Plan:

## 2014-01-21 ENCOUNTER — Ambulatory Visit: Payer: Commercial Managed Care - PPO | Admitting: Cardiovascular Disease

## 2014-01-29 ENCOUNTER — Encounter: Payer: Self-pay | Admitting: Family Medicine

## 2014-01-29 ENCOUNTER — Ambulatory Visit (INDEPENDENT_AMBULATORY_CARE_PROVIDER_SITE_OTHER): Payer: Commercial Managed Care - PPO | Admitting: Family Medicine

## 2014-01-29 VITALS — BP 128/88 | HR 78 | Temp 98.3°F | Wt 236.5 lb

## 2014-01-29 DIAGNOSIS — J309 Allergic rhinitis, unspecified: Secondary | ICD-10-CM | POA: Insufficient documentation

## 2014-01-29 DIAGNOSIS — I10 Essential (primary) hypertension: Secondary | ICD-10-CM

## 2014-01-29 NOTE — Assessment & Plan Note (Signed)
Persistent. Using flonase.   Will add Allegra (not D). See AVS for details.

## 2014-01-29 NOTE — Progress Notes (Signed)
Pre visit review using our clinic review tool, if applicable. No additional management support is needed unless otherwise documented below in the visit note. 

## 2014-01-29 NOTE — Patient Instructions (Signed)
Great to see you. Continue your current dose of blood pressure medication- it looks great!  Keep your appointment with cardiology.  You can buy Claritin or Allegra over the counter (stay away from [place] or ClaritinD).

## 2014-01-29 NOTE — Progress Notes (Signed)
   Subjective:    Patient ID: Grace Gallagher, female    DOB: December 04, 1971, 43 y.o.   MRN: [id number]  HPI  43 yo female here for follow up HTN.  Increased her Lisinopril to 40 mg daily and HCTZ to 25 mg daily (added Kdur) earlier this month. Also referred to cardiology (see my note from 01/10/2014).  When I saw her at [facility] (while I was working there), she said BP still elevated so I advised her to come in here today.  Since she made the appointment, BPs have been improving.  Normotensive here today.  BP Readings from Last 3 Encounters:  01/29/14 128/88  01/10/14 168/98  01/08/14 134/98     Patient Active Problem List   Diagnosis Date Noted  . Allergic rhinitis 01/29/2014  . Hypokalemia 01/10/2014  . Back pain 01/06/2014  . Bilateral lower abdominal cramping 08/29/2013  . Spotting 08/29/2013  . Unspecified contraceptive management 07/01/2013  . Encounter for routine gynecological examination 07/01/2013  . Headache(784.0) 03/31/2013  . HTN (hypertension) 02/21/2013  . Obesity, unspecified 02/21/2013  . Microcytic anemia 02/21/2013   Past Medical History  Diagnosis Date  . Hypertension   . Anemia    Past Surgical History  Procedure Laterality Date  . Foot surgery    . Tubal ligation    . Breast surgery  08/2011    breast reduction   History  Substance Use Topics  . Smoking status: Never Smoker   . Smokeless tobacco: Never Used  . Alcohol Use: No   Family History  Problem Relation Age of Onset  . Hypertension Mother    Allergies  Allergen Reactions  . Hydrocodone Nausea Only   Current Outpatient Prescriptions on File Prior to Visit  Medication Sig Dispense Refill  . ferrous sulfate 325 (65 FE) MG tablet Take 325 mg by mouth 2 (two) times daily.      . fluticasone (FLONASE) 50 MCG/ACT nasal spray Place 2 sprays into the nose daily as needed for rhinitis.      Marland Kitchen ibuprofen (ADVIL,MOTRIN) 800 MG tablet Take 1 tablet (800 mg total) by mouth 3 (three) times  daily.  21 tablet  0  . lisinopril-hydrochlorothiazide (ZESTORETIC) 20-12.5 MG per tablet Take 2 tablets by mouth daily.  90 tablet  3  . methocarbamol (ROBAXIN) 500 MG tablet Take 1 tablet (500 mg total) by mouth 2 (two) times daily.  20 tablet  0  . potassium chloride (K-DUR,KLOR-CON) 10 MEQ tablet Take 1 tablet (10 mEq total) by mouth 2 (two) times daily.  30 tablet  3   No current facility-administered medications on file prior to visit.   The PMH, PSH, Social History, Family History, Medications, and allergies have been reviewed in [facility], and have been updated if relevant.  Review of Systems See HPI No blurred vision, CP or SOB +allergic rhinitis    Objective:   Physical Exam BP 128/88  Pulse 78  Temp(Src) 98.3 F (36.8 C) (Oral)  Wt 236 lb 8 oz (107.276 kg)  SpO2 97%  LMP 01/06/2014 Gen:  Alert, pleasant, NAD Resp:  CTA bilaterally CVS:  RRR Ext:  No edema     Assessment & Plan:

## 2014-01-29 NOTE — Assessment & Plan Note (Signed)
BP now well controlled. No changes.   Reassurance provided- advised to keep appt with cardiology.

## 2014-02-04 ENCOUNTER — Encounter: Payer: Self-pay | Admitting: Cardiovascular Disease

## 2014-02-04 ENCOUNTER — Ambulatory Visit (INDEPENDENT_AMBULATORY_CARE_PROVIDER_SITE_OTHER): Payer: Commercial Managed Care - PPO | Admitting: Cardiovascular Disease

## 2014-02-04 VITALS — BP 144/95 | HR 65 | Ht 64.0 in | Wt 235.2 lb

## 2014-02-04 DIAGNOSIS — R0602 Shortness of breath: Secondary | ICD-10-CM

## 2014-02-04 DIAGNOSIS — I10 Essential (primary) hypertension: Secondary | ICD-10-CM

## 2014-02-04 MED ORDER — AMLODIPINE BESYLATE 5 MG PO TABS: 5.0000 mg | ORAL_TABLET | Freq: Every day | ORAL | Status: DC

## 2014-02-04 NOTE — Assessment & Plan Note (Signed)
The patient seems to have accelerated hypertension. Previously blood pressure has been controlled for years. I agree that we should exclude secondary causes of hypertension. It is still possible that this is essential hypertension especially with her family history. I advised her to stop using nonsteroidal anti-inflammatory medications which might be worsening her blood pressure control. She has no symptoms suggestive of sleep apnea. She was noted to have hypokalemia and thus hyperaldosteronism should be excluded. I will check labs that include aldosterone to renin ratio, cortisol level and TSH. I also order renal artery duplex ultrasound to exclude fibromuscular dysplasia. I added amlodipine 5 mg once daily for better blood pressure control.

## 2014-02-04 NOTE — Assessment & Plan Note (Signed)
She reports worsening exertional dyspnea and thus I will obtain an echocardiogram to evaluate LV systolic and diastolic function.

## 2014-02-04 NOTE — Progress Notes (Signed)
Primary care physician: Dr. Deborra Medina  HPI  This is a 43 yo African American female who was referred for evaluation and management of worsening hypertension. She reports no history of hypertension diagnosed about 3-4 years ago which was well controlled on lisinopril. However, she noticed significant worsening of blood pressure control over the last few months. She had an emergency room visit in December for back pain and was noted to be hypertensive. She had another emergency room visit in January for elevated blood pressure. She was noted to be hypokalemic. Hydrochlorothiazide was subsequently added She does have family history of hypertension at an early age. There is no history of sleep apnea. 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 She complains of frequent headaches when her blood pressure is elevated. She has been using ibuprofen for both headache and back pain. She denies any change in her weight. She does complain of worsening exertional dyspnea without chest discomfort.     Allergies  Allergen Reactions  . Hydrocodone Nausea Only     Current Outpatient Prescriptions on File Prior to Visit  Medication Sig Dispense Refill  . fluticasone (FLONASE) 50 MCG/ACT nasal spray Place 2 sprays into the nose 2 (two) times daily.       Marland Kitchen lisinopril-hydrochlorothiazide (ZESTORETIC) 20-12.5 MG per tablet Take 2 tablets by mouth daily.  90 tablet  3  . potassium chloride (K-DUR,KLOR-CON) 10 MEQ tablet Take 1 tablet (10 mEq total) by mouth 2 (two) times daily.  30 tablet  3   No current facility-administered medications on file prior to visit.     Past Medical History  Diagnosis Date  . Hypertension   . Anemia      Past Surgical History  Procedure Laterality Date  . Foot surgery    . Tubal ligation    . Breast surgery  08/2011    breast reduction     Family History  Problem Relation Age of Onset  . Hypertension Mother   . Heart failure Mother   . Heart  attack Mother      History   Social History  . Marital Status: Divorced    Spouse Name: N/A    Number of Children: N/A  . Years of Education: N/A   Occupational History  . Not on file.   Social History Main Topics  . Smoking status: Never Smoker   . Smokeless tobacco: Never Used  . Alcohol Use: No  . Drug Use: No  . Sexual Activity: Yes   Other Topics Concern  . Not on file   Social History Narrative   Two children- 23 and 62 yo live with her.   CNA at [employer].     ROS A 10 point review of system was performed. It is negative other than that mentioned in the history of present illness.   PHYSICAL EXAM   BP 144/95  Ht 5\' 4"  (1.626 m)  Wt 235 lb 4 oz (106.709 kg)  BMI 40.36 kg/m2  LMP 01/06/2014 Constitutional: She is oriented to person, place, and time. She appears well-developed and well-nourished. No distress.  HENT: No nasal discharge.  Head: Normocephalic and atraumatic.  Eyes: Pupils are equal and round. No discharge.  Neck: Normal range of motion. Neck supple. No JVD present. No thyromegaly present.  Cardiovascular: Normal rate, regular rhythm, normal heart sounds. Exam reveals no gallop and no friction rub. No murmur heard.  Pulmonary/Chest: Effort normal and breath sounds normal. No stridor. No respiratory  distress. She has no wheezes. She has no rales. She exhibits no tenderness.  Abdominal: Soft. Bowel sounds are normal. She exhibits no distension. There is no tenderness. There is no rebound and no guarding.  Musculoskeletal: Normal range of motion. She exhibits no edema and no tenderness.  Neurological: She is alert and oriented to person, place, and time. Coordination normal.  Skin: Skin is warm and dry. No rash noted. She is not diaphoretic. No erythema. No pallor.  Psychiatric: She has a normal mood and affect. Her behavior is normal. Judgment and thought content normal.     VVO:HYWVP  Rhythm  -Prominent R(V1) -nonspecific.    BORDERLINE   ASSESSMENT AND PLAN

## 2014-02-04 NOTE — Patient Instructions (Signed)
Labs today.   Your physician has requested that you have an echocardiogram. Echocardiography is a painless test that uses sound waves to create images of your heart. It provides your doctor with information about the size and shape of your heart and how well your heart's chambers and valves are working. This procedure takes approximately one hour. There are no restrictions for this procedure.  Your physician has requested that you have a renal artery duplex. During this test, an ultrasound is used to evaluate blood flow to the kidneys. Allow one hour for this exam. Do not eat after midnight the day before and avoid carbonated beverages. Take your medications as you usually do.  Stop taking Ibuprofen and any nonsteroidal antiinflammatory medications. You can use Tylenol  instead .   Start Amlodipine 5 mg once daily.   Follow up after tests.

## 2014-02-05 ENCOUNTER — Telehealth: Payer: Self-pay | Admitting: *Deleted

## 2014-02-05 NOTE — Telephone Encounter (Signed)
Patient attempted to have lab collected but the lab informed her that it has to be done in the morning. She is only able to take of work Thursday morning to get it done.

## 2014-02-13 ENCOUNTER — Other Ambulatory Visit: Payer: Self-pay

## 2014-02-13 ENCOUNTER — Ambulatory Visit: Payer: Self-pay | Admitting: Cardiovascular Disease

## 2014-02-13 ENCOUNTER — Other Ambulatory Visit (INDEPENDENT_AMBULATORY_CARE_PROVIDER_SITE_OTHER): Payer: Commercial Managed Care - PPO

## 2014-02-13 DIAGNOSIS — R0602 Shortness of breath: Secondary | ICD-10-CM

## 2014-02-13 LAB — TSH: Thyroid Stimulating Horm: 0.887 u[IU]/mL

## 2014-02-18 ENCOUNTER — Encounter (INDEPENDENT_AMBULATORY_CARE_PROVIDER_SITE_OTHER): Payer: Commercial Managed Care - PPO

## 2014-02-18 DIAGNOSIS — I10 Essential (primary) hypertension: Secondary | ICD-10-CM

## 2014-02-24 ENCOUNTER — Encounter: Payer: Self-pay | Admitting: Cardiovascular Disease

## 2014-02-24 ENCOUNTER — Ambulatory Visit (INDEPENDENT_AMBULATORY_CARE_PROVIDER_SITE_OTHER): Payer: Commercial Managed Care - PPO | Admitting: Cardiovascular Disease

## 2014-02-24 VITALS — BP 117/79 | HR 82 | Ht 64.0 in | Wt 233.2 lb

## 2014-02-24 DIAGNOSIS — R0602 Shortness of breath: Secondary | ICD-10-CM

## 2014-02-24 DIAGNOSIS — I10 Essential (primary) hypertension: Secondary | ICD-10-CM

## 2014-02-24 NOTE — Progress Notes (Signed)
Primary care physician: Dr. Deborra Medina  HPI  This is a 43 yo African American female who is here today for a followup visit regarding  hypertension. She reports no history of hypertension diagnosed about 3-4 years ago which was well controlled on lisinopril for many years up until recently.. She had an emergency room visit in December for back pain and was noted to be hypertensive. She had another emergency room visit in January for elevated blood pressure. She was noted to be hypokalemic. Hydrochlorothiazide was subsequently added She does have family history of hypertension at an early age. There is no history of sleep apnea. She was using ibuprofen frequently for headache. I instructed her to stop using NSAIDs. Workup for secondary hypertension was negative including renal artery duplex and aldosterone/renin ratio. She was started on amlodipine 5 mg once daily with subsequent improvement. She feels overall better. Echocardiogram showed normal LV systolic function with no evidence of pulmonary hypertension.    Allergies  Allergen Reactions  . Hydrocodone Nausea Only     Current Outpatient Prescriptions on File Prior to Visit  Medication Sig Dispense Refill  . amLODipine (NORVASC) 5 MG tablet Take 1 tablet (5 mg total) by mouth daily.  30 tablet  6  . fluticasone (FLONASE) 50 MCG/ACT nasal spray Place 2 sprays into the nose 2 (two) times daily.       Marland Kitchen lisinopril-hydrochlorothiazide (ZESTORETIC) 20-12.5 MG per tablet Take 2 tablets by mouth daily.  90 tablet  3  . potassium chloride (K-DUR,KLOR-CON) 10 MEQ tablet Take 1 tablet (10 mEq total) by mouth 2 (two) times daily.  30 tablet  3   No current facility-administered medications on file prior to visit.     Past Medical History  Diagnosis Date  . Hypertension   . Anemia      Past Surgical History  Procedure Laterality Date  . Foot surgery    . Tubal ligation    . Breast surgery  08/2011    breast reduction     Family History   Problem Relation Age of Onset  . Hypertension Mother   . Heart failure Mother   . Heart attack Mother      History   Social History  . Marital Status: Divorced    Spouse Name: N/A    Number of Children: N/A  . Years of Education: N/A   Occupational History  . Not on file.   Social History Main Topics  . Smoking status: Never Smoker   . Smokeless tobacco: Never Used  . Alcohol Use: No  . Drug Use: No  . Sexual Activity: Yes   Other Topics Concern  . Not on file   Social History Narrative   Two children- 43 and 67 yo live with her.   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 It is negative other than that mentioned in the history of present illness.   PHYSICAL EXAM   BP 117/79  Pulse 82  Ht 5\' 4"  (1.626 m)  Wt 233 lb 4 oz (105.802 kg)  BMI 40.02 kg/m2 Constitutional: She is oriented to person, place, and time. She appears well-developed and well-nourished. No distress.  HENT: No nasal discharge.  Head: Normocephalic and atraumatic.  Eyes: Pupils are equal and round. No discharge.  Neck: Normal range of motion. Neck supple. No JVD present. No thyromegaly present.  Cardiovascular: Normal rate, regular rhythm, normal heart sounds. Exam reveals no gallop and no  friction rub. No murmur heard.  Pulmonary/Chest: Effort normal and breath sounds normal. No stridor. No respiratory distress. She has no wheezes. She has no rales. She exhibits no tenderness.  Abdominal: Soft. Bowel sounds are normal. She exhibits no distension. There is no tenderness. There is no rebound and no guarding.  Musculoskeletal: Normal range of motion. She exhibits no edema and no tenderness.  Neurological: She is alert and oriented to person, place, and time. Coordination normal.  Skin: Skin is warm and dry. No rash noted. She is not diaphoretic. No erythema. No pallor.  Psychiatric: She has a normal mood and affect. Her behavior is normal. Judgment and thought  content normal.     ASSESSMENT AND PLAN

## 2014-02-24 NOTE — Patient Instructions (Signed)
Continue same medications.   Follow up as needed.  

## 2014-02-24 NOTE — Assessment & Plan Note (Signed)
Echocardiogram showed normal LV systolic/diastolic function with no significant valvular abnormalities or evidence of pulmonary hypertension. Dyspnea improved significantly with blood pressure control. She can followup with me as needed.

## 2014-02-24 NOTE — Assessment & Plan Note (Signed)
Workup for secondary hypertension was negative. She likely has essential hypertension. The use of NSAIDs likely worsened blood pressure control as well. Currently, blood pressure is well controlled on current medications. I recommend continuing the same medications.

## 2014-03-11 ENCOUNTER — Ambulatory Visit: Payer: Commercial Managed Care - PPO | Admitting: Family Medicine

## 2014-03-13 ENCOUNTER — Ambulatory Visit (INDEPENDENT_AMBULATORY_CARE_PROVIDER_SITE_OTHER): Payer: Commercial Managed Care - PPO | Admitting: Family Medicine

## 2014-03-13 ENCOUNTER — Encounter: Payer: Self-pay | Admitting: Family Medicine

## 2014-03-13 VITALS — BP 132/84 | HR 82 | Temp 97.5°F | Wt 233.0 lb

## 2014-03-13 DIAGNOSIS — R109 Unspecified abdominal pain: Secondary | ICD-10-CM

## 2014-03-13 DIAGNOSIS — N390 Urinary tract infection, site not specified: Secondary | ICD-10-CM

## 2014-03-13 MED ORDER — CIPROFLOXACIN HCL 500 MG PO TABS: 500.0000 mg | ORAL_TABLET | Freq: Two times a day (BID) | ORAL | Status: DC

## 2014-03-13 NOTE — Addendum Note (Signed)
Addended by: Modena Nunnery on: 03/13/2014 11:05 AM   Modules accepted: Orders

## 2014-03-13 NOTE — Progress Notes (Signed)
SUBJECTIVE: Grace Gallagher is a 43 y.o. female who complains of urinary frequency, urgency and dysuria x 7 days, without flank pain, fever, chills, or abnormal vaginal discharge or bleeding.   Patient Active Problem List   Diagnosis Date Noted  . Essential hypertension, malignant 02/04/2014  . SOB (shortness of breath) 02/04/2014  . Allergic rhinitis 01/29/2014  . Hypokalemia 01/10/2014  . Back pain 01/06/2014  . Bilateral lower abdominal cramping 08/29/2013  . Spotting 08/29/2013  . Unspecified contraceptive management 07/01/2013  . Encounter for routine gynecological examination 07/01/2013  . Headache(784.0) 03/31/2013  . HTN (hypertension) 02/21/2013  . Obesity, unspecified 02/21/2013  . Microcytic anemia 02/21/2013   Past Medical History  Diagnosis Date  . Hypertension   . Anemia    Past Surgical History  Procedure Laterality Date  . Foot surgery    . Tubal ligation    . Breast surgery  08/2011    breast reduction   History  Substance Use Topics  . Smoking status: Never Smoker   . Smokeless tobacco: Never Used  . Alcohol Use: No   Family History  Problem Relation Age of Onset  . Hypertension Mother   . Heart failure Mother   . Heart attack Mother    Allergies  Allergen Reactions  . Hydrocodone Nausea Only   Current Outpatient Prescriptions on File Prior to Visit  Medication Sig Dispense Refill  . amLODipine (NORVASC) 5 MG tablet Take 1 tablet (5 mg total) by mouth daily.  30 tablet  6  . fluticasone (FLONASE) 50 MCG/ACT nasal spray Place 2 sprays into the nose 2 (two) times daily.       Marland Kitchen lisinopril-hydrochlorothiazide (ZESTORETIC) 20-12.5 MG per tablet Take 2 tablets by mouth daily.  90 tablet  3  . potassium chloride (K-DUR,KLOR-CON) 10 MEQ tablet Take 1 tablet (10 mEq total) by mouth 2 (two) times daily.  30 tablet  3   No current facility-administered medications on file prior to visit.   The PMH, PSH, Social History, Family History, Medications, and  allergies have been reviewed in [facility], and have been updated if relevant.  OBJECTIVE:  BP 132/84  Pulse 82  Temp(Src) 97.5 F (36.4 C) (Oral)  Wt 233 lb (105.688 kg)  SpO2 97%  LMP 03/10/2014 Appears well, in no apparent distress.  Vital signs are normal. The abdomen is soft without tenderness, guarding, mass, rebound or organomegaly. No CVA tenderness or inguinal adenopathy noted. Urine dipstick shows unable to do because gross blood since she is currently on her period  ASSESSMENT: UTI uncomplicated without evidence of pyelonephritis  PLAN: Treatment per orders - also push fluids, may use Pyridium OTC prn. Call or return to clinic prn if these symptoms worsen or fail to improve as anticipated.

## 2014-03-13 NOTE — Progress Notes (Signed)
Pre visit review using our clinic review tool, if applicable. No additional management support is needed unless otherwise documented below in the visit note. 

## 2014-03-13 NOTE — Patient Instructions (Signed)
Great to see you. Start taking Cipro 500 twice daily x 3 days. You can take AZO for burning.  We will call you in two days with your culture results.

## 2014-03-15 LAB — URINE CULTURE

## 2014-03-26 ENCOUNTER — Ambulatory Visit: Payer: Commercial Managed Care - PPO | Admitting: Family Medicine

## 2014-03-27 ENCOUNTER — Ambulatory Visit: Payer: Commercial Managed Care - PPO | Admitting: Family Medicine

## 2014-03-27 DIAGNOSIS — Z0289 Encounter for other administrative examinations: Secondary | ICD-10-CM

## 2014-04-09 ENCOUNTER — Telehealth: Payer: Self-pay

## 2014-04-09 ENCOUNTER — Ambulatory Visit (INDEPENDENT_AMBULATORY_CARE_PROVIDER_SITE_OTHER): Payer: Commercial Managed Care - PPO | Admitting: Adult Health

## 2014-04-09 ENCOUNTER — Encounter: Payer: Self-pay | Admitting: Adult Health

## 2014-04-09 VITALS — BP 148/98 | HR 75 | Temp 98.2°F | Resp 12 | Wt 238.2 lb

## 2014-04-09 DIAGNOSIS — I10 Essential (primary) hypertension: Secondary | ICD-10-CM

## 2014-04-09 DIAGNOSIS — J019 Acute sinusitis, unspecified: Secondary | ICD-10-CM | POA: Insufficient documentation

## 2014-04-09 MED ORDER — METOPROLOL TARTRATE 50 MG PO TABS: 50.0000 mg | ORAL_TABLET | Freq: Two times a day (BID) | ORAL | Status: DC

## 2014-04-09 MED ORDER — AMOXICILLIN-POT CLAVULANATE 875-125 MG PO TABS: 1.0000 | ORAL_TABLET | Freq: Two times a day (BID) | ORAL | Status: DC

## 2014-04-09 NOTE — Patient Instructions (Addendum)
  Start Metoprolol 50 mg twice a day for your blood pressure. Monitor your blood pressure daily and record. Also monitor your heart rate and write it down so that you can bring with you on your next appointment.   If your pulse drops below 60 then hold the metoprolol.  For your sinuses:   Start Augmentin 1 tablet twice daily for 7 days.   Irrigate your sinuses with saline nasal spray. You may use this as often as you like.  For your cough, start Delsym or Robitussin with guaifenesin and dextromethorphan.  Schedule a follow up with Dr. Deborra Medina in 1 week.

## 2014-04-09 NOTE — Progress Notes (Signed)
Pre visit review using our clinic review tool, if applicable. No additional management support is needed unless otherwise documented below in the visit note. 

## 2014-04-09 NOTE — Telephone Encounter (Signed)
Patient Information:  Caller Name: Uyen  Phone: [phone]  Patient: Grace Gallagher, Grace Gallagher  Gender: Female  DOB: 1971/08/11  Age: 43 Years  PCP: Arnette Norris [facility])  Pregnant: No  Office Follow Up:  Does the office need to follow up with this patient?: No  Instructions For The Office: N/A  RN Note:  Hypertension noted since 04/08/14. BP 148/105 Left arm at 0830.  Reports headache rated 8/10 now; headache at top of head began 04/09/14. No vision changes. Left arm is tingling. Has full range of motion of arm. Ambulatory.  No appintments remain at [facility].  Discusssed ED vs office visit with [place].  Rina called Lorriane Shire at [facility] who approved 30 minute appointment for 1415 with Denzil Magnuson, NP.  Instructed to go to ED or UC if symptoms worsen.   Symptoms  Reason For Call & Symptoms: Hypertension with fatigue and feels "sluggish."  New onset of headache.  Reviewed Health History In EMR: Yes  Reviewed Medications In EMR: Yes  Reviewed Allergies In EMR: Yes  Reviewed Surgeries / Procedures: Yes  Date of Onset of Symptoms: 04/08/2014  Treatments Tried: Taking BP meds  Treatments Tried Worked: No OB / GYN:  LMP: 03/30/2014  Guideline(s) Used:  High Blood Pressure  Disposition Per Guideline:   Go to ED Now (or to Office with PCP Approval)  Reason For Disposition Reached:   BP > 160 / 100 and any cardiac or neurologic symptoms (e.g., chest pain, difficulty breathing, unsteady gait, blurred vision)  Advice Given:  General:  Untreated high blood pressure may cause damage to the heart, brain, kidneys, and eyes.  Treatment of high blood pressure can reduce the risk of stroke, heart attack, and heart failure.  The goal of blood pressure treatment for most patients with hypertension is to keep the blood pressure under 140/90.  Call Back If:  Headache, blurred vision, difficulty talking, or difficulty walking occurs  Chest pain or difficulty breathing occurs  You want to go in to  the office for a blood pressure check  You become worse.  Patient Will Follow Care Advice:  YES  Appointment Scheduled:  04/09/2014 14:15:00 Appointment Scheduled Provider:  Other

## 2014-04-09 NOTE — Progress Notes (Signed)
Subjective:    Patient ID: Grace Gallagher, female    DOB: 21-Apr-1971, 44 y.o.   MRN: [id number]  HPI Patient is a pleasant 43 year old female with history of hypertension currently on lisinopril/HCTZ, amlodipine who presents to clinic with elevated blood pressure and headache. She reports that recently her blood pressure was in the 737T systolic and lower 062I diastolic. She has been taking her medication exactly as prescribed. She is not taking any over-the-counter medications that may be contributing to her elevated blood pressure.  Patient has been having sinus symptoms including sinus pressure, congestion, postnasal drip for greater than 2 weeks. She has not been using the Flonase nasal spray secondary to her elevated blood pressure. She has not taken any over-the-counter medication for her symptoms. Denies fever, chills. Reports frontal headache.   Past Medical History  Diagnosis Date  . Hypertension   . Anemia      Past Surgical History  Procedure Laterality Date  . Foot surgery    . Tubal ligation    . Breast surgery  08/2011    breast reduction     Family History  Problem Relation Age of Onset  . Hypertension Mother   . Heart failure Mother   . Heart attack Mother      History   Social History  . Marital Status: Divorced    Spouse Name: N/A    Number of Children: N/A  . Years of Education: N/A   Occupational History  . Not on file.   Social History Main Topics  . Smoking status: Never Smoker   . Smokeless tobacco: Never Used  . Alcohol Use: No  . Drug Use: No  . Sexual Activity: Yes   Other Topics Concern  . Not on file   Social History Narrative   Two children- 75 and 85 yo live with her.   CNA at [employer].   Review of Systems  Constitutional: Negative for fever and chills.  HENT: Positive for congestion, postnasal drip and sinus pressure. Negative for sore throat.   Respiratory: Positive for cough. Negative for chest tightness, shortness of  breath and wheezing.   Cardiovascular: Negative for chest pain and palpitations.  Neurological: Positive for headaches. Negative for dizziness, syncope and light-headedness.  All other systems reviewed and are negative.      Objective:   Physical Exam  Constitutional: She is oriented to person, place, and time. She appears well-developed and well-nourished. No distress.  HENT:  Head: Normocephalic and atraumatic.  Right Ear: External ear normal.  Left Ear: External ear normal.  Mouth/Throat: Oropharynx is clear and moist. No oropharyngeal exudate.  Cardiovascular: Normal rate, regular rhythm and normal heart sounds.  Exam reveals no gallop.   No murmur heard. Pulmonary/Chest: Effort normal and breath sounds normal. No respiratory distress. She has no wheezes. She has no rales.  Musculoskeletal: Normal range of motion.  Lymphadenopathy:    She has no cervical adenopathy.  Neurological: She is alert and oriented to person, place, and time.  Psychiatric: She has a normal mood and affect. Her behavior is normal. Judgment and thought content normal.      Assessment & Plan:   1. HTN (hypertension) Blood pressure is still elevated. Add metoprolol 50 mg bid. Monitor b/p and HR daily and record. Asked pt to bring with her on her follow up appt with Dr. Deborra Medina in 1 week.   2. Acute sinusitis with symptoms > 10 days Suspect that her headache is coming from the  sinus congestion. She sounds nasally. Start Augmentin bid x 7 days. For cough she will take OTC Delsym or Robitussin. Saline nasal spray to irrigate sinuses.

## 2014-04-09 NOTE — Telephone Encounter (Signed)
Grace Gallagher with [facility]; pt has hypertension BP 148/105,h/a with pain level 8; fatigue and lt arm tingling but has full range of motion. No available appts or work ins at [facility] and Grace Gallagher wants to know if can schedule at [facility]. Spoke with Lorriane Shire at [facility]; advised can schedule 30 min with Raquel Rey NP at 2:15p. Grace Gallagher voiced understanding. Grace Gallagher scheduling appt and advised Grace Gallagher if pt condition changes or worsens prior to appt for pt to go to [facility] or ED.

## 2014-04-10 NOTE — Telephone Encounter (Signed)
Patient Information:  Caller Name: [place]  Phone: [phone]  Patient: Grace Gallagher, Grace Gallagher  Gender: Female  DOB: [birth year]  Age: 43 Years  PCP: Arnette Norris [facility])  Pregnant: No  Office Follow Up:  Does the office need to follow up with this patient?: Yes  Instructions For The Office: she started Metoprolol this am and wondering if this is making her feel bad  RN Note:  will leave work and go home to rest and wait for further instructions  Symptoms  Reason For Call & Symptoms: BP elevated at 146/102, feels tired and not herself ,may be foggy , heads hurts,at work with call, she was seen 04/09/14 and Metoprolol added to meds  and started this am  Reviewed Health History In EMR: Yes  Reviewed Medications In EMR: Yes  Reviewed Allergies In EMR: Yes  Reviewed Surgeries / Procedures: Yes  Date of Onset of Symptoms: 04/10/2014 OB / GYN:  LMP: 03/30/2014  Guideline(s) Used:  High Blood Pressure  Disposition Per Guideline:   Discuss with PCP and Callback by Nurse Today  Reason For Disposition Reached:   Taking BP medications and feels is having side effects (e.g., impotence, cough, dizziness)  Advice Given:  N/A  Patient Will Follow Care Advice:  YES

## 2014-04-10 NOTE — Telephone Encounter (Signed)
DO not take metoprolol tonight.  I would rather she take another 5 mg of Norvasc tonight if her BP is high.  Please schedule her in office to see someone tomorrow am (I am not going to be there)

## 2014-04-11 ENCOUNTER — Encounter: Payer: Self-pay | Admitting: Internal Medicine

## 2014-04-11 ENCOUNTER — Ambulatory Visit (INDEPENDENT_AMBULATORY_CARE_PROVIDER_SITE_OTHER): Payer: Commercial Managed Care - PPO | Admitting: Internal Medicine

## 2014-04-11 VITALS — BP 128/72 | HR 70 | Temp 98.4°F | Wt 233.8 lb

## 2014-04-11 DIAGNOSIS — I10 Essential (primary) hypertension: Secondary | ICD-10-CM

## 2014-04-11 DIAGNOSIS — J019 Acute sinusitis, unspecified: Secondary | ICD-10-CM

## 2014-04-11 NOTE — Patient Instructions (Addendum)

## 2014-04-11 NOTE — Telephone Encounter (Signed)
Noted! Thank you

## 2014-04-11 NOTE — Telephone Encounter (Signed)
Spoke to pt who states that she did not receive a call back on the afternoon of 04/10/14 and states that she DID take metoprolol last night. She indicates that she is feeling a little better but OV sched for 1130 with [facility]

## 2014-04-11 NOTE — Progress Notes (Signed)
Subjective:    Patient ID: Grace Gallagher, female    DOB: 1971/02/20, 43 y.o.   MRN: [id number]  HPI  Pt presents to the clinic today with c/o elevated blood pressure. She was seen for the same 2 days ago by [employer]. She is on Norvasc, Lisinopril-HCT. Raquel added Metoprolol 50 mg BID to pts regimen. After taking the metoprolol, she reports she has felt a little tired and not herself. She has had a slight headache.  Additionally, she is currently being treated for a sinus infection with Augmentin. She has only been taking it one day.  Review of Systems      Past Medical History  Diagnosis Date  . Hypertension   . Anemia     Current Outpatient Prescriptions  Medication Sig Dispense Refill  . amLODipine (NORVASC) 5 MG tablet Take 1 tablet (5 mg total) by mouth daily.  30 tablet  6  . amoxicillin-clavulanate (AUGMENTIN) 875-125 MG per tablet Take 1 tablet by mouth 2 (two) times daily.  14 tablet  0  . fluticasone (FLONASE) 50 MCG/ACT nasal spray Place 2 sprays into the nose 2 (two) times daily.       Marland Kitchen lisinopril-hydrochlorothiazide (ZESTORETIC) 20-12.5 MG per tablet Take 2 tablets by mouth daily.  90 tablet  3  . metoprolol (LOPRESSOR) 50 MG tablet Take 1 tablet (50 mg total) by mouth 2 (two) times daily.  60 tablet  3  . potassium chloride (K-DUR,KLOR-CON) 10 MEQ tablet Take 1 tablet (10 mEq total) by mouth 2 (two) times daily.  30 tablet  3   No current facility-administered medications for this visit.    Allergies  Allergen Reactions  . Hydrocodone Nausea Only    Family History  Problem Relation Age of Onset  . Hypertension Mother   . Heart failure Mother   . Heart attack Mother     History   Social History  . Marital Status: Divorced    Spouse Name: N/A    Number of Children: N/A  . Years of Education: N/A   Occupational History  . Not on file.   Social History Main Topics  . Smoking status: Never Smoker   . Smokeless tobacco: Never Used  . Alcohol  Use: No  . Drug Use: No  . Sexual Activity: Yes   Other Topics Concern  . Not on file   Social History Narrative   Two children- 41 and 39 yo live with her.   CNA at [employer].     Constitutional: Pt reports headache, fatigue. Denies fever, malaise,  or abrupt weight changes.  Respiratory: Denies difficulty breathing, shortness of breath, cough or sputum production.   Cardiovascular: Denies chest pain, chest tightness, palpitations or swelling in the hands or feet.  Neurological: Denies dizziness, difficulty with memory, difficulty with speech or problems with balance and coordination.   No other specific complaints in a complete review of systems (except as listed in HPI above).  Objective:   Physical Exam  BP 128/72  Pulse 70  Temp(Src) 98.4 F (36.9 C) (Oral)  Wt 233 lb 12 oz (106.028 kg)  SpO2 97%  LMP 03/31/2014 Wt Readings from Last 3 Encounters:  04/11/14 233 lb 12 oz (106.028 kg)  04/09/14 238 lb 4 oz (108.069 kg)  03/13/14 233 lb (105.688 kg)    General: Appears her stated age, obese but well developed, well nourished in NAD. HEENT: Head: normal shape and size; Eyes: sclera white, no icterus, conjunctiva pink, PERRLA and  EOMs intact; Ears: Tm's pink and intact, normal light reflex; Nose: mucosa pink and moist, septum midline; Throat/Mouth: Teeth present, mucosa erythematous and moist, no exudate, lesions or ulcerations noted.  Cardiovascular: Normal rate and rhythm. S1,S2 noted.  No murmur, rubs or gallops noted. No JVD or BLE edema. No carotid bruits noted. Pulmonary/Chest: Normal effort and positive vesicular breath sounds. No respiratory distress. No wheezes, rales or ronchi noted.  Neurological: Alert and oriented. Cranial nerves II-XII intact. Coordination normal. +DTRs bilaterally.     BMET    Component Value Date/Time   NA 140 01/08/2014 2253   K 3.6* 01/08/2014 2253   CL 101 01/08/2014 2253   CO2 24 01/06/2014 1546   GLUCOSE 88 01/08/2014 2253   BUN 14  01/08/2014 2253   CREATININE 0.90 01/08/2014 2253   CALCIUM 9.0 01/06/2014 1546   GFRNONAA >90 10/08/2012 0950   GFRAA >90 10/08/2012 0950    Lipid Panel     Component Value Date/Time   CHOL 166 07/01/2013 1558   TRIG 98.0 07/01/2013 1558   HDL 53.00 07/01/2013 1558   CHOLHDL 3 07/01/2013 1558   VLDL 19.6 07/01/2013 1558   LDLCALC 93 07/01/2013 1558    CBC    Component Value Date/Time   WBC 4.3* 01/06/2014 1546   RBC 4.86 01/06/2014 1546   HGB 13.6 01/08/2014 2253   HCT 40.0 01/08/2014 2253   PLT 222.0 01/06/2014 1546   MCV 76.6* 01/06/2014 1546   MCH 21.6* 10/08/2012 0950   MCHC 31.0 01/06/2014 1546   RDW 15.4* 01/06/2014 1546   LYMPHSABS 1.7 01/06/2014 1546   MONOABS 0.4 01/06/2014 1546   EOSABS 0.2 01/06/2014 1546   BASOSABS 0.0 01/06/2014 1546    Hgb A1C No results found for this basename: HGBA1C        Assessment & Plan:   HTN:  Stop metoprolol and continue all other BP meds Follow up in 1 month with PCP to recheck BP  Acute Sinusitis:  Already on augmentin and flonase Suggested ibuprofen BID x 2-3 days  RTC in 1 month or sooner if symptoms persist

## 2014-04-11 NOTE — Progress Notes (Signed)
Pre visit review using our clinic review tool, if applicable. No additional management support is needed unless otherwise documented below in the visit note. 

## 2014-05-30 ENCOUNTER — Encounter: Payer: Self-pay | Admitting: Family Medicine

## 2014-05-30 ENCOUNTER — Ambulatory Visit (INDEPENDENT_AMBULATORY_CARE_PROVIDER_SITE_OTHER): Payer: Commercial Managed Care - PPO | Admitting: Family Medicine

## 2014-05-30 VITALS — BP 140/90 | HR 72 | Temp 98.2°F | Wt 248.2 lb

## 2014-05-30 DIAGNOSIS — R5381 Other malaise: Secondary | ICD-10-CM

## 2014-05-30 DIAGNOSIS — I10 Essential (primary) hypertension: Secondary | ICD-10-CM

## 2014-05-30 DIAGNOSIS — R5383 Other fatigue: Secondary | ICD-10-CM

## 2014-05-30 DIAGNOSIS — N921 Excessive and frequent menstruation with irregular cycle: Secondary | ICD-10-CM

## 2014-05-30 DIAGNOSIS — N920 Excessive and frequent menstruation with regular cycle: Secondary | ICD-10-CM

## 2014-05-30 LAB — COMPREHENSIVE METABOLIC PANEL
ALT: 13 U/L (ref 0–35)
AST: 13 U/L (ref 0–37)
Albumin: 3.7 g/dL (ref 3.5–5.2)
Alkaline Phosphatase: 47 U/L (ref 39–117)
BILIRUBIN TOTAL: 0.4 mg/dL (ref 0.2–1.2)
BUN: 11 mg/dL (ref 6–23)
CO2: 26 meq/L (ref 19–32)
Calcium: 8.8 mg/dL (ref 8.4–10.5)
Chloride: 105 mEq/L (ref 96–112)
Creat: 0.72 mg/dL (ref 0.50–1.10)
GLUCOSE: 64 mg/dL — AB (ref 70–99)
Potassium: 4 mEq/L (ref 3.5–5.3)
Sodium: 138 mEq/L (ref 135–145)
Total Protein: 6.3 g/dL (ref 6.0–8.3)

## 2014-05-30 LAB — CBC WITH DIFFERENTIAL/PLATELET
BASOS PCT: 0 % (ref 0–1)
Basophils Absolute: 0 10*3/uL (ref 0.0–0.1)
Eosinophils Absolute: 0.2 10*3/uL (ref 0.0–0.7)
Eosinophils Relative: 3 % (ref 0–5)
HCT: 29.3 % — ABNORMAL LOW (ref 36.0–46.0)
HEMOGLOBIN: 9.1 g/dL — AB (ref 12.0–15.0)
Lymphocytes Relative: 42 % (ref 12–46)
Lymphs Abs: 2.2 10*3/uL (ref 0.7–4.0)
MCH: 22.1 pg — AB (ref 26.0–34.0)
MCHC: 31.1 g/dL (ref 30.0–36.0)
MCV: 71.1 fL — ABNORMAL LOW (ref 78.0–100.0)
MONO ABS: 0.5 10*3/uL (ref 0.1–1.0)
MONOS PCT: 10 % (ref 3–12)
NEUTROS PCT: 45 % (ref 43–77)
Neutro Abs: 2.3 10*3/uL (ref 1.7–7.7)
Platelets: 260 10*3/uL (ref 150–400)
RBC: 4.12 MIL/uL (ref 3.87–5.11)
RDW: 16 % — ABNORMAL HIGH (ref 11.5–15.5)
WBC: 5.2 10*3/uL (ref 4.0–10.5)

## 2014-05-30 MED ORDER — NORGESTIM-ETH ESTRAD TRIPHASIC 0.18/0.215/0.25 MG-35 MCG PO TABS: 1.0000 | ORAL_TABLET | Freq: Every day | ORAL | Status: DC

## 2014-05-30 MED ORDER — LISINOPRIL-HYDROCHLOROTHIAZIDE 20-25 MG PO TABS: 1.0000 | ORAL_TABLET | Freq: Every day | ORAL | Status: DC

## 2014-05-30 NOTE — Assessment & Plan Note (Signed)
See above.  Start OCPs- non smoker.

## 2014-05-30 NOTE — Progress Notes (Signed)
Pre visit review using our clinic review tool, if applicable. No additional management support is needed unless otherwise documented below in the visit note. 

## 2014-05-30 NOTE — Assessment & Plan Note (Signed)
Deteriorated. Will increase HCTZ dose in her prinzide. Advised to increase potassium in diet. Follow up in 2 weeks for repeat Bp and BMET.

## 2014-05-30 NOTE — Patient Instructions (Signed)
Good to see you. I will call you with your lab results. We are making two medication changes- 1.  We are increasing the dose of your lisinopril HCTZ- do not take the dose you have now with it. 2.  We are starting ortho tri cyclin.  Follow up in 2 weeks.

## 2014-05-30 NOTE — Addendum Note (Signed)
Addended by: Ellamae Sia on: 05/30/2014 04:33 PM   Modules accepted: Orders

## 2014-05-30 NOTE — Progress Notes (Signed)
   Subjective:   Patient ID: Grace Gallagher, female    DOB: [birth year], 43 y.o.   MRN: [id number]  Grace Gallagher is a pleasant 43 y.o. year old female who presents to clinic today with Fatigue  on 05/30/2014  HPI: Fatigue- past 3 weeks, progressive fatigue. Denies SOB or depression. "I could sleep all day."  Periods are heavy again- has bled for 3 weeks straight.  H/o blood loss anemia.  Was on depo for this but then started bleeding heavily with depo.  BP has been slowly going up.  Grace Gallagher add Metoprolol 50 mg twice daily to her norvasc and HCTZ but this caused hypotension and fatigue.   Grace Gallagher stopped this last month.  Current Outpatient Prescriptions on File Prior to Visit  Medication Sig Dispense Refill  . amLODipine (NORVASC) 5 MG tablet Take 1 tablet (5 mg total) by mouth daily.  30 tablet  6  . fluticasone (FLONASE) 50 MCG/ACT nasal spray Place 2 sprays into the nose 2 (two) times daily.       . potassium chloride (K-DUR,KLOR-CON) 10 MEQ tablet Take 1 tablet (10 mEq total) by mouth 2 (two) times daily.  30 tablet  3  . metoprolol (LOPRESSOR) 50 MG tablet Take 1 tablet (50 mg total) by mouth 2 (two) times daily.  60 tablet  3   No current facility-administered medications on file prior to visit.    Allergies  Allergen Reactions  . Hydrocodone Nausea Only    Past Medical History  Diagnosis Date  . Hypertension   . Anemia     Past Surgical History  Procedure Laterality Date  . Foot surgery    . Tubal ligation    . Breast surgery  08/2011    breast reduction    Family History  Problem Relation Age of Onset  . Hypertension Mother   . Heart failure Mother   . Heart attack Mother     History   Social History  . Marital Status: Divorced    Spouse Name: N/A    Number of Children: N/A  . Years of Education: N/A   Occupational History  . Not on file.   Social History Main Topics  . Smoking status: Never Smoker   . Smokeless tobacco: Never Used  .  Alcohol Use: No  . Drug Use: No  . Sexual Activity: Yes   Other Topics Concern  . Not on file   Social History Narrative   Two children- 45 and 59 yo live with her.   CNA at [employer].   The PMH, PSH, Social History, Family History, Medications, and allergies have been reviewed in [facility], and have been updated if relevant.  Review of Systems    See HPI Objective:    BP 140/90  Pulse 72  Temp(Src) 98.2 F (36.8 C) (Oral)  Wt 248 lb 4 oz (112.605 kg)  SpO2 98%  LMP 05/10/2014   Physical Exam  Gen:  Alert, pleasant, NAD Appears pale Resp:  CTA bilaterally CVS:  RRR Ext:  Trace edema bilaterally      Assessment & Plan:   Essential hypertension  Other malaise and fatigue - Plan: CBC with Differential, Vitamin B12, Comprehensive metabolic panel, TSH Return in about 2 months (around 07/30/2014) for blood pressure recheck.Marland Kitchen

## 2014-05-30 NOTE — Assessment & Plan Note (Signed)
?   Anemia. Advised to restart her iron. Check labs today. Orders Placed This Encounter  Procedures  . CBC with Differential  . Vitamin B12  . Comprehensive metabolic panel  . TSH   Start OCPs for menorrhagia.

## 2014-05-31 LAB — VITAMIN B12: Vitamin B-12: 392 pg/mL (ref 211–911)

## 2014-05-31 LAB — TSH: TSH: 1.399 u[IU]/mL (ref 0.350–4.500)

## 2014-06-02 ENCOUNTER — Ambulatory Visit (INDEPENDENT_AMBULATORY_CARE_PROVIDER_SITE_OTHER): Payer: Commercial Managed Care - PPO | Admitting: Family Medicine

## 2014-06-02 ENCOUNTER — Encounter: Payer: Self-pay | Admitting: Family Medicine

## 2014-06-02 ENCOUNTER — Telehealth: Payer: Self-pay | Admitting: Family Medicine

## 2014-06-02 VITALS — BP 134/78 | HR 81 | Temp 98.4°F | Wt 245.8 lb

## 2014-06-02 DIAGNOSIS — R42 Dizziness and giddiness: Secondary | ICD-10-CM | POA: Insufficient documentation

## 2014-06-02 DIAGNOSIS — R51 Headache: Secondary | ICD-10-CM

## 2014-06-02 MED ORDER — KETOROLAC TROMETHAMINE 60 MG/2ML IM SOLN
60.0000 mg | Freq: Once | INTRAMUSCULAR | Status: AC
  Administered 2014-06-02: 60 mg via INTRAMUSCULAR

## 2014-06-02 NOTE — Progress Notes (Signed)
Pre visit review using our clinic review tool, if applicable. No additional management support is needed unless otherwise documented below in the visit note. 

## 2014-06-02 NOTE — Telephone Encounter (Signed)
Patient Information:  Caller Name: Rocky  Phone: [phone]  Patient: Gilroy, Konya Fauble  Gender: Female  DOB: [birth year]  Age: 43 Years  PCP: Arnette Norris [facility])  Pregnant: No  Office Follow Up:  Does the office need to follow up with this patient?: Yes  Instructions For The Office: No appointment available at this office or with R. Rey.  Is there appointment that can be worked in 06/02/14?  Patient states she is willing to come on 06/03/14 if that is possible.   Symptoms  Reason For Call & Symptoms: Patient reports feeling dizzy 5-10 minutes at a time, headache rated at 7 of 10.  She reports she was seen 05/30/14.  Emergent symptoms ruled out.  See Today in Office per Headache guideline due to Patient wants to be seen.  Reviewed Health History In EMR: Yes  Reviewed Medications In EMR: Yes  Reviewed Allergies In EMR: Yes  Reviewed Surgeries / Procedures: Yes  Date of Onset of Symptoms: 06/02/2014  Treatments Tried: Sitting in quiet room - relieved dizziness.  Treatments Tried Worked: No OB / GYN:  LMP: 05/10/2014  Guideline(s) Used:  Headache  Disposition Per Guideline:   See Today in Office  Reason For Disposition Reached:   Patient wants to be seen  Advice Given:  Pain Medicines:  For pain relief, you can take either acetaminophen, ibuprofen, or naproxen.  They are over-the-counter (OTC) pain drugs. You can buy them at the drugstore.  Rest:   Lie down in a dark, quiet place and try to relax. Close your eyes and imagine your entire body relaxing.  Apply Cold to the Area:   Apply a cold wet washcloth or cold pack to the forehead for 20 minutes.  Call Back If:  You become worse.  Patient Will Follow Care Advice:  YES

## 2014-06-02 NOTE — Addendum Note (Signed)
Addended by: Lurlean Nanny on: 06/02/2014 01:19 PM   Modules accepted: Orders

## 2014-06-02 NOTE — Telephone Encounter (Signed)
Spoke to pt and advised per Dr Deborra Medina, that if she can be here by 1245, Dr Deborra Medina can see her as a double-booked work in. If pt does not arrive on time or is not able to make it during time offered, she has been advised to go directly to urgent care. Pt verbally expressed understanding.

## 2014-06-02 NOTE — Progress Notes (Signed)
Subjective:   Patient ID: Grace Gallagher, female    DOB: 1971/05/04, 43 y.o.   MRN: [id number]  Grace Gallagher is a pleasant 42 y.o. year old female who presents to clinic as a work in today with Dizziness and Headache  on 06/02/2014  HPI: Dizziness- increased her HCTZ to 25 mg daily, continued her lisinopril 20 mg daily 3 days ago due to symptomatic elevated BP. Last night, developed HA (frontal)- 7 or 8/10.  Did not take any pain medication last but Tylenol helped a little today. Was dizzy at work- felt like she was going to pass out.  Dizziness has resolved. No nausea, vomiting or blurred vision.  Also found to be anemic again. Lab Results  Component Value Date   WBC 5.2 05/30/2014   HGB 9.1* 05/30/2014   HCT 29.3* 05/30/2014   MCV 71.1* 05/30/2014   PLT 260 05/30/2014   Started OCPS and restarted iron- likely due to heavy menorrhagia.  Current Outpatient Prescriptions on File Prior to Visit  Medication Sig Dispense Refill  . amLODipine (NORVASC) 5 MG tablet Take 1 tablet (5 mg total) by mouth daily.  30 tablet  6  . fluticasone (FLONASE) 50 MCG/ACT nasal spray Place 2 sprays into the nose 2 (two) times daily.       Marland Kitchen lisinopril-hydrochlorothiazide (PRINZIDE,ZESTORETIC) 20-25 MG per tablet Take 1 tablet by mouth daily.  30 tablet  3  . metoprolol (LOPRESSOR) 50 MG tablet Take 1 tablet (50 mg total) by mouth 2 (two) times daily.  60 tablet  3  . Norgestimate-Ethinyl Estradiol Triphasic 0.18/0.215/0.25 MG-35 MCG tablet Take 1 tablet by mouth daily.  1 Package  11  . potassium chloride (K-DUR,KLOR-CON) 10 MEQ tablet Take 1 tablet (10 mEq total) by mouth 2 (two) times daily.  30 tablet  3   No current facility-administered medications on file prior to visit.    Allergies  Allergen Reactions  . Hydrocodone Nausea Only    Past Medical History  Diagnosis Date  . Hypertension   . Anemia     Past Surgical History  Procedure Laterality Date  . Foot surgery    . Tubal ligation      . Breast surgery  08/2011    breast reduction    Family History  Problem Relation Age of Onset  . Hypertension Mother   . Heart failure Mother   . Heart attack Mother     History   Social History  . Marital Status: Divorced    Spouse Name: N/A    Number of Children: N/A  . Years of Education: N/A   Occupational History  . Not on file.   Social History Main Topics  . Smoking status: Never Smoker   . Smokeless tobacco: Never Used  . Alcohol Use: No  . Drug Use: No  . Sexual Activity: Yes   Other Topics Concern  . Not on file   Social History Narrative   Two children- 71 and 5 yo live with her.   CNA at [employer].   The PMH, PSH, Social History, Family History, Medications, and allergies have been reviewed in [facility], and have been updated if relevant.  Review of Systems    See HPI Objective:    BP 134/78  Pulse 81  Temp(Src) 98.4 F (36.9 C) (Oral)  Wt 245 lb 12 oz (111.471 kg)  SpO2 97%  LMP 05/10/2014   Physical Exam Gen:  Alert, tearful but no acute distress HEENT:  No  photophobia CVS:  RRR Neuro:  CN II- XII grossly intact, normal grip strength bilaterally     Assessment & Plan:   Dizziness and giddiness  Headache(784.0) No Follow-up on file.

## 2014-06-02 NOTE — Assessment & Plan Note (Signed)
Resolved. Call or return to clinic prn if these symptoms worsen or fail to improve as anticipated. The patient indicates understanding of these issues and agrees with the plan.

## 2014-06-02 NOTE — Telephone Encounter (Signed)
If someone can bring her back during lunch, I will work through lunch and she can come in now.  Otherwise, she will need to unfortunately to urgent care.

## 2014-06-02 NOTE — Assessment & Plan Note (Signed)
Normotensive, normal heart rate, not orthostatic on exam. ?tension HA. Im toradol given in office. Call or return to clinic prn if these symptoms worsen or fail to improve as anticipated. The patient indicates understanding of these issues and agrees with the plan.

## 2014-06-03 ENCOUNTER — Telehealth: Payer: Self-pay | Admitting: Family Medicine

## 2014-06-03 ENCOUNTER — Ambulatory Visit (INDEPENDENT_AMBULATORY_CARE_PROVIDER_SITE_OTHER): Payer: Commercial Managed Care - PPO | Admitting: Adult Health

## 2014-06-03 ENCOUNTER — Encounter: Payer: Self-pay | Admitting: Adult Health

## 2014-06-03 ENCOUNTER — Emergency Department: Payer: Self-pay | Admitting: Emergency Medicine

## 2014-06-03 VITALS — BP 146/93 | HR 86 | Temp 98.5°F | Resp 14 | Ht 64.0 in | Wt 245.8 lb

## 2014-06-03 DIAGNOSIS — R519 Headache, unspecified: Secondary | ICD-10-CM

## 2014-06-03 DIAGNOSIS — R42 Dizziness and giddiness: Secondary | ICD-10-CM

## 2014-06-03 DIAGNOSIS — R231 Pallor: Secondary | ICD-10-CM

## 2014-06-03 DIAGNOSIS — R51 Headache: Secondary | ICD-10-CM

## 2014-06-03 LAB — COMPREHENSIVE METABOLIC PANEL
ALBUMIN: 3.4 g/dL (ref 3.4–5.0)
Alkaline Phosphatase: 55 U/L
Anion Gap: 6 — ABNORMAL LOW (ref 7–16)
BUN: 10 mg/dL (ref 7–18)
Bilirubin,Total: 0.4 mg/dL (ref 0.2–1.0)
CO2: 28 mmol/L (ref 21–32)
Calcium, Total: 8.5 mg/dL (ref 8.5–10.1)
Chloride: 106 mmol/L (ref 98–107)
Creatinine: 0.85 mg/dL (ref 0.60–1.30)
EGFR (African American): >60
EGFR (Non-African Amer.): >60
GLUCOSE: 73 mg/dL (ref 65–99)
OSMOLALITY: 277 (ref 275–301)
Potassium: 3.5 mmol/L (ref 3.5–5.1)
SGOT(AST): 24 U/L (ref 15–37)
SGPT (ALT): 25 U/L (ref 12–78)
Sodium: 140 mmol/L (ref 136–145)
Total Protein: 7.3 g/dL (ref 6.4–8.2)

## 2014-06-03 LAB — CBC
HCT: 34.2 % — ABNORMAL LOW (ref 35.0–47.0)
HGB: 10.3 g/dL — ABNORMAL LOW (ref 12.0–16.0)
MCH: 21.5 pg — ABNORMAL LOW (ref 26.0–34.0)
MCHC: 30 g/dL — ABNORMAL LOW (ref 32.0–36.0)
MCV: 72 fL — AB (ref 80–100)
Platelet: 245 10*3/uL (ref 150–440)
RBC: 4.77 10*6/uL (ref 3.80–5.20)
RDW: 15.2 % — AB (ref 11.5–14.5)
WBC: 5.2 10*3/uL (ref 3.6–11.0)

## 2014-06-03 LAB — TROPONIN I

## 2014-06-03 NOTE — Telephone Encounter (Signed)
Appt scheduled by Morey Hummingbird for Grace Forward, NP in [place] this afternoon.

## 2014-06-03 NOTE — Telephone Encounter (Signed)
Please have her go directly to an urgent care if no one available this afternoon.  I am not here this afternoon but she needs to be seen.

## 2014-06-03 NOTE — Progress Notes (Signed)
Subjective:    Patient ID: Grace Gallagher, female    DOB: May 30, 1971, 43 y.o.   MRN: [id number]  Dizziness Associated symptoms include fatigue and headaches (photosensitivity). Pertinent negatives include no numbness or weakness.  Headache  Associated symptoms include dizziness. Pertinent negatives include no numbness or weakness.    43 yo female presents today for continued headache and dizziness. Has had a headache and dizziness for 3 days now and feels like she is going to pass out. This is the 3rd visit with these symptoms in the past 5 days. This is the worst headache she has had in a long time.   Seen most recently on 6/29 with question of tension headache. This was treated with an IM injection of toradol. This did not decrease the headache.   Previous medication adjustments were made to her Zestoretic increasing the HCTZ to 25 mg.  Currently denies nausea, vomiting, changes in vision, neck pain or illness. Has sensitivity to light that feels better when the light is off. Has not taken any medications for the headache other than her blood pressure medications (metoprolol is currently held). Has decreased appetite secondary to headache.   All medications were reviewed with the patient.   Past Medical History  Diagnosis Date  . Hypertension   . Anemia      Past Surgical History  Procedure Laterality Date  . Foot surgery    . Tubal ligation    . Breast surgery  08/2011    breast reduction     Family History  Problem Relation Age of Onset  . Hypertension Mother   . Heart failure Mother   . Heart attack Mother      History   Social History  . Marital Status: Divorced    Spouse Name: N/A    Number of Children: N/A  . Years of Education: N/A   Occupational History  . Not on file.   Social History Main Topics  . Smoking status: Never Smoker   . Smokeless tobacco: Never Used  . Alcohol Use: No  . Drug Use: No  . Sexual Activity: Yes   Other Topics Concern   . Not on file   Social History Narrative   Two children- 76 and 66 yo live with her.   CNA at [employer].     Current Outpatient Prescriptions on File Prior to Visit  Medication Sig Dispense Refill  . amLODipine (NORVASC) 5 MG tablet Take 1 tablet (5 mg total) by mouth daily.  30 tablet  6  . fluticasone (FLONASE) 50 MCG/ACT nasal spray Place 2 sprays into the nose 2 (two) times daily.       Marland Kitchen lisinopril-hydrochlorothiazide (PRINZIDE,ZESTORETIC) 20-25 MG per tablet Take 1 tablet by mouth daily.  30 tablet  3  . metoprolol (LOPRESSOR) 50 MG tablet Take 1 tablet (50 mg total) by mouth 2 (two) times daily.  60 tablet  3  . Norgestimate-Ethinyl Estradiol Triphasic 0.18/0.215/0.25 MG-35 MCG tablet Take 1 tablet by mouth daily.  1 Package  11  . potassium chloride (K-DUR,KLOR-CON) 10 MEQ tablet Take 1 tablet (10 mEq total) by mouth 2 (two) times daily.  30 tablet  3   No current facility-administered medications on file prior to visit.     Review of Systems  Constitutional: Positive for fatigue.  Eyes: Negative.   Respiratory: Negative.   Cardiovascular: Negative.   Gastrointestinal: Negative.   Endocrine: Negative.   Skin: Positive for pallor.  Allergic/Immunologic: Negative.   Neurological:  Positive for dizziness, syncope (near syncope - feels like she is going to pass out), light-headedness and headaches (photosensitivity). Negative for tremors, weakness and numbness.  Hematological: Negative.        Objective:  BP 146/93  Pulse 86  Temp(Src) 98.5 F (36.9 C) (Oral)  Resp 14  Wt 245 lb 12 oz (111.471 kg)  SpO2 98%  LMP 05/10/2014   Physical Exam  Nursing note and vitals reviewed. Constitutional: She is oriented to person, place, and time. No distress (Noticeably uncomfortable).  Eyes: Conjunctivae and EOM are normal. Pupils are equal, round, and reactive to light.  Neck: Neck supple.  Cardiovascular: Normal rate, regular rhythm and normal heart sounds.     Pulmonary/Chest: Effort normal and breath sounds normal.  Neurological: She is alert and oriented to person, place, and time.  Skin: Skin is warm and dry. There is pallor.  Psychiatric: Judgment and thought content normal.      Assessment & Plan:   1. Acute intractable headache, unspecified headache type Continues to have significant intractable headache despite Toradol. Recommend CT scan. Refer to emergency department for further evaluation and work-up. Son has been contacted and will pick patient up in office to transport to hospital given her reports of dizziness and feeling like she is going to pass out. Pt repeatedly stating "I just don't feel good".   2. Dizziness Continues to describe dizziness and that she feels like she is going to pass out.   3. Pale Lab values consistent with iron deficiency anemia. Follow-up with PCP once headache and dizziness resolve. CBC on 05/30/14 with Hgb 9.1 which is down from 13.6 four months ago.

## 2014-06-03 NOTE — Telephone Encounter (Signed)
Left vm for pt to go either to UC or call us back to schedule appt at our [facility] location.  No appts available at [facility] today.

## 2014-06-03 NOTE — Telephone Encounter (Signed)
Pt called to give update on condition.  She still feels faint, is having bad HA's, and current BP 162/100.  She has not taken any medications besides her normal BP meds today.

## 2014-06-03 NOTE — Progress Notes (Signed)
Pre visit review using our clinic review tool, if applicable. No additional management support is needed unless otherwise documented below in the visit note. 

## 2014-06-09 ENCOUNTER — Telehealth: Payer: Self-pay

## 2014-06-09 MED ORDER — FERROUS SULFATE 325 (65 FE) MG PO TABS
325.0000 mg | ORAL_TABLET | Freq: Two times a day (BID) | ORAL | Status: DC
Start: 2014-06-09 — End: 2015-02-19

## 2014-06-09 NOTE — Telephone Encounter (Signed)
Pt said when discussed lab results pt was to restart iron pill; pt thought she had iron pill at home but pt does not have iron pill and request iron pill sent to [facility].Please advise.

## 2014-06-09 NOTE — Telephone Encounter (Signed)
Rx sent.  How is she feeling?

## 2014-06-09 NOTE — Telephone Encounter (Signed)
Spoke to pt and informed her Rx has been sent to requested pharmacy. She indicates that she is "not feeling any better." Pt states that she was Raquel at [facility] for BP and dizziness and states that her BP was 162/100 and she was sent directly to [facility]; request sent to obtain records.

## 2014-06-12 ENCOUNTER — Ambulatory Visit: Payer: Commercial Managed Care - PPO | Admitting: Family Medicine

## 2014-06-12 NOTE — Telephone Encounter (Signed)
Spoke to pt at [facility] yesterday.  She feels better.

## 2014-07-11 ENCOUNTER — Encounter: Payer: Self-pay | Admitting: Internal Medicine

## 2014-07-11 ENCOUNTER — Ambulatory Visit (INDEPENDENT_AMBULATORY_CARE_PROVIDER_SITE_OTHER): Payer: Commercial Managed Care - PPO | Admitting: Internal Medicine

## 2014-07-11 VITALS — BP 134/88 | HR 76 | Temp 98.0°F | Wt 247.2 lb

## 2014-07-11 DIAGNOSIS — J3089 Other allergic rhinitis: Secondary | ICD-10-CM

## 2014-07-11 MED ORDER — METHYLPREDNISOLONE ACETATE 80 MG/ML IJ SUSP
80.0000 mg | Freq: Once | INTRAMUSCULAR | Status: AC
  Administered 2014-07-11: 80 mg via INTRAMUSCULAR

## 2014-07-11 NOTE — Addendum Note (Signed)
Addended by: Royann Shivers A on: 07/11/2014 01:57 PM   Modules accepted: Orders

## 2014-07-11 NOTE — Patient Instructions (Addendum)

## 2014-07-11 NOTE — Progress Notes (Signed)
Pre visit review using our clinic review tool, if applicable. No additional management support is needed unless otherwise documented below in the visit note. 

## 2014-07-11 NOTE — Progress Notes (Signed)
Subjective:    Patient ID: Grace Gallagher, female    DOB: 02-Dec-1971, 43 y.o.   MRN: [id number]  HPI  Pt presents to the clinic today with c/o allergies.She c/o headache, watery eyes, runny nose and sore throat. She is not blowing anything out of her nose. It seems to be worse first thing in the mornings and and night. She denies fever, chills or body aches. She has not tried anything OTC. She has no history of allergies or breathing problems. She has had sick contacts.  Review of Systems      Past Medical History  Diagnosis Date  . Hypertension   . Anemia     Current Outpatient Prescriptions  Medication Sig Dispense Refill  . amLODipine (NORVASC) 5 MG tablet Take 1 tablet (5 mg total) by mouth daily.  30 tablet  6  . ferrous sulfate 325 (65 FE) MG tablet Take 1 tablet (325 mg total) by mouth 2 (two) times daily.  60 tablet  3  . lisinopril-hydrochlorothiazide (PRINZIDE,ZESTORETIC) 20-25 MG per tablet Take 1 tablet by mouth daily.  30 tablet  3  . metoprolol (LOPRESSOR) 50 MG tablet Take 50 mg by mouth 2 (two) times daily as needed.      . Norgestimate-Ethinyl Estradiol Triphasic 0.18/0.215/0.25 MG-35 MCG tablet Take 1 tablet by mouth daily.  1 Package  11  . potassium chloride (K-DUR,KLOR-CON) 10 MEQ tablet Take 1 tablet (10 mEq total) by mouth 2 (two) times daily.  30 tablet  3  . fluticasone (FLONASE) 50 MCG/ACT nasal spray Place 2 sprays into the nose 2 (two) times daily.        No current facility-administered medications for this visit.    Allergies  Allergen Reactions  . Hydrocodone Nausea Only    Family History  Problem Relation Age of Onset  . Hypertension Mother   . Heart failure Mother   . Heart attack Mother     History   Social History  . Marital Status: Divorced    Spouse Name: N/A    Number of Children: N/A  . Years of Education: N/A   Occupational History  . Not on file.   Social History Main Topics  . Smoking status: Never Smoker   .  Smokeless tobacco: Never Used  . Alcohol Use: No  . Drug Use: No  . Sexual Activity: Yes   Other Topics Concern  . Not on file   Social History Narrative   Two children- 38 and 81 yo live with her.   CNA at [employer].     Constitutional: Pt reports headache. Denies fever, malaise, fatigue or abrupt weight changes.  HEENT: Pt reports watery eyes and runny nose. Denies eye pain, eye redness, ear pain, ringing in the ears, wax buildup, nasal congestion, bloody nose, or sore throat. Respiratory: Pt reports cough. Denies difficulty breathing, shortness of breath, or sputum production.   Cardiovascular: Denies chest pain, chest tightness, palpitations or swelling in the hands or feet.    No other specific complaints in a complete review of systems (except as listed in HPI above).  Objective:   Physical Exam   BP 134/88  Pulse 76  Temp(Src) 98 F (36.7 C) (Oral)  Wt 247 lb 4 oz (112.152 kg)  LMP 06/27/2014 Wt Readings from Last 3 Encounters:  07/11/14 247 lb 4 oz (112.152 kg)  06/03/14 245 lb 12 oz (111.471 kg)  06/02/14 245 lb 12 oz (111.471 kg)    General: Appears her stated  age, obese but well developed, well nourished in NAD. HEENT: Head: normal shape and size; Eyes: sclera white, no icterus, conjunctiva pink, PERRLA and EOMs intact; Ears: Tm's gray and intact, normal light reflex; Nose: mucosa boggy and moist, septum midline; Throat/Mouth: Teeth present, mucosa pink and moist, + PND no exudate, lesions or ulcerations noted.   Cardiovascular: Normal rate and rhythm. S1,S2 noted.  No murmur, rubs or gallops noted. No JVD or BLE edema. No carotid bruits noted. Pulmonary/Chest: Normal effort and positive vesicular breath sounds. No respiratory distress. No wheezes, rales or ronchi noted.   BMET    Component Value Date/Time   NA 138 05/30/2014 1553   K 4.0 05/30/2014 1553   CL 105 05/30/2014 1553   CO2 26 05/30/2014 1553   GLUCOSE 64* 05/30/2014 1553   BUN 11 05/30/2014 1553    CREATININE 0.72 05/30/2014 1553   CREATININE 0.90 01/08/2014 2253   CALCIUM 8.8 05/30/2014 1553   GFRNONAA >90 10/08/2012 0950   GFRAA >90 10/08/2012 0950    Lipid Panel     Component Value Date/Time   CHOL 166 07/01/2013 1558   TRIG 98.0 07/01/2013 1558   HDL 53.00 07/01/2013 1558   CHOLHDL 3 07/01/2013 1558   VLDL 19.6 07/01/2013 1558   LDLCALC 93 07/01/2013 1558    CBC    Component Value Date/Time   WBC 5.2 05/30/2014 1553   RBC 4.12 05/30/2014 1553   HGB 9.1* 05/30/2014 1553   HCT 29.3* 05/30/2014 1553   PLT 260 05/30/2014 1553   MCV 71.1* 05/30/2014 1553   MCH 22.1* 05/30/2014 1553   MCHC 31.1 05/30/2014 1553   RDW 16.0* 05/30/2014 1553   LYMPHSABS 2.2 05/30/2014 1553   MONOABS 0.5 05/30/2014 1553   EOSABS 0.2 05/30/2014 1553   BASOSABS 0.0 05/30/2014 1553    Hgb A1C No results found for this basename: HGBA1C        Assessment & Plan:   Allergic Rhinitis:  Will send in RX for Allegra and Flonsae 80 mg Depo IM today Watch for fever, facial pain and pressure or purulent nasal drainage  RTC as needed or if symptoms persist or worsen

## 2014-07-15 ENCOUNTER — Telehealth: Payer: Self-pay

## 2014-07-15 NOTE — Telephone Encounter (Signed)
It is OTC.

## 2014-07-15 NOTE — Telephone Encounter (Signed)
Pt was seen on 07/11/14; pt thought flonase and allegra was going to be called to [facility]. Pt request cb when refilled.

## 2014-07-16 NOTE — Telephone Encounter (Signed)
Spoke to pt and she is aware as instructed 

## 2014-09-10 ENCOUNTER — Ambulatory Visit (INDEPENDENT_AMBULATORY_CARE_PROVIDER_SITE_OTHER): Payer: Commercial Managed Care - PPO | Admitting: Family Medicine

## 2014-09-10 ENCOUNTER — Encounter: Payer: Self-pay | Admitting: Family Medicine

## 2014-09-10 VITALS — BP 152/92 | HR 76 | Temp 98.2°F | Wt 254.2 lb

## 2014-09-10 DIAGNOSIS — N760 Acute vaginitis: Secondary | ICD-10-CM

## 2014-09-10 DIAGNOSIS — I10 Essential (primary) hypertension: Secondary | ICD-10-CM

## 2014-09-10 MED ORDER — FLUCONAZOLE 150 MG PO TABS: ORAL_TABLET | ORAL | Status: DC

## 2014-09-10 MED ORDER — AMLODIPINE BESYLATE 10 MG PO TABS: 10.0000 mg | ORAL_TABLET | Freq: Every day | ORAL | Status: DC

## 2014-09-10 NOTE — Patient Instructions (Signed)
Good to see you. We are increasing amlodipine (norvasc) to 10 mg daily- ok to take two of your 5 mg tablets until you run out.

## 2014-09-10 NOTE — Assessment & Plan Note (Signed)
New- did not examen since symptoms resolved. Will send in rx for po diflucan to take if symptoms worsen. Call or return to clinic prn if these symptoms worsen or fail to improve as anticipated. The patient indicates understanding of these issues and agrees with the plan.

## 2014-09-10 NOTE — Progress Notes (Signed)
Pre visit review using our clinic review tool, if applicable. No additional management support is needed unless otherwise documented below in the visit note. 

## 2014-09-10 NOTE — Progress Notes (Signed)
Subjective:   Patient ID: Grace Gallagher, female    DOB: 1971/06/27, 44 y.o.   MRN: [id number]  Dashawna Delbridge Handler is a pleasant 43 y.o. year old female who presents to clinic today with Vaginal Itching  on 09/10/2014  HPI: 43 yo originally here for ? Vaginal yeast infection.  3 days of vulvular itching, no discharge. No foul odor.  No dysuria.  She is currently sexually active. Has been using OTC antifungal and already feels better.  Here because she "wants something stronger" in case it does not resolve.  HTN- h/o difficult to control BP.  I referred her to see Dr. Fletcher Anon to help manage her BP. Last saw him in 02/2014- note reviewed.  No changes were made. Past several, BP has been increasing.  Last week was 172/142 at home.  Had a severe HA.  No blurred vision, CP or SOB. She has been under more stress lately. No recent NSAIDs.  Currently taking Amlodipine 5 mg, Metoprolol 50 mg twice daily and Prinzide 20-25 mg daily. Reports being compliant with her rx. Lab Results  Component Value Date   CREATININE 0.72 05/30/2014   Current Outpatient Prescriptions on File Prior to Visit  Medication Sig Dispense Refill  . ferrous sulfate 325 (65 FE) MG tablet Take 1 tablet (325 mg total) by mouth 2 (two) times daily.  60 tablet  3  . fluticasone (FLONASE) 50 MCG/ACT nasal spray Place 2 sprays into the nose 2 (two) times daily.       Marland Kitchen lisinopril-hydrochlorothiazide (PRINZIDE,ZESTORETIC) 20-25 MG per tablet Take 1 tablet by mouth daily.  30 tablet  3  . metoprolol (LOPRESSOR) 50 MG tablet Take 50 mg by mouth 2 (two) times daily as needed.      . Norgestimate-Ethinyl Estradiol Triphasic 0.18/0.215/0.25 MG-35 MCG tablet Take 1 tablet by mouth daily.  1 Package  11  . potassium chloride (K-DUR,KLOR-CON) 10 MEQ tablet Take 1 tablet (10 mEq total) by mouth 2 (two) times daily.  30 tablet  3   No current facility-administered medications on file prior to visit.    Allergies  Allergen Reactions  .  Hydrocodone Nausea Only    Past Medical History  Diagnosis Date  . Hypertension   . Anemia     Past Surgical History  Procedure Laterality Date  . Foot surgery    . Tubal ligation    . Breast surgery  08/2011    breast reduction    Family History  Problem Relation Age of Onset  . Hypertension Mother   . Heart failure Mother   . Heart attack Mother     History   Social History  . Marital Status: Divorced    Spouse Name: N/A    Number of Children: N/A  . Years of Education: N/A   Occupational History  . Not on file.   Social History Main Topics  . Smoking status: Never Smoker   . Smokeless tobacco: Never Used  . Alcohol Use: No  . Drug Use: No  . Sexual Activity: Yes   Other Topics Concern  . Not on file   Social History Narrative   Two children- 76 and 40 yo live with her.   CNA at [employer].   The PMH, PSH, Social History, Family History, Medications, and allergies have been reviewed in [facility], and have been updated if relevant.   Review of Systems    See HPI No current CP or HA +fatigue No dysuria No flank pain  No suprapubic pain No fevers No LE edema Objective:    BP 152/92  Pulse 76  Temp(Src) 98.2 F (36.8 C) (Oral)  Wt 254 lb 4 oz (115.327 kg)  SpO2 97%  LMP 08/24/2014   Physical Exam   General:  Well-developed,well-nourished,in no acute distress; alert,appropriate and cooperative throughout examination Head:  normocephalic and atraumatic.   Lungs:  Normal respiratory effort, chest expands symmetrically. Lungs are clear to auscultation, no crackles or wheezes. Heart:  Normal rate and regular rhythm. S1 and S2 normal without gallop, murmur, click, rub or other extra sounds. Abdomen:  Bowel sounds positive,abdomen soft and non-tender without masses, organomegaly or hernias noted.  oriented X3 and gait normal.   Skin:  Intact without suspicious lesions or rashes Cervical Nodes:  No lymphadenopathy noted Axillary Nodes:  No palpable  lymphadenopathy Psych:  Cognition and judgment appear intact. Alert and cooperative with normal attention span and concentration. No apparent delusions, illusions, hallucinations       Assessment & Plan:   Essential hypertension  Vaginitis and vulvovaginitis Return in about 2 weeks (around 09/24/2014) for follow up high blood pressure.

## 2014-09-10 NOTE — Assessment & Plan Note (Signed)
Deteriorated. Will increase amlodipine to 10 mg daily. Follow up in 2 weeks to recheck BP.

## 2014-09-10 NOTE — Progress Notes (Deleted)
   Subjective:    Patient ID: Grace Gallagher, female    DOB: 17-Jan-1971, 43 y.o.   MRN: [id number]  HPI    Review of Systems     Objective:   Physical Exam        Assessment & Plan:

## 2014-09-22 ENCOUNTER — Telehealth: Payer: Self-pay | Admitting: Family Medicine

## 2014-09-22 ENCOUNTER — Ambulatory Visit: Payer: Commercial Managed Care - PPO | Admitting: Family Medicine

## 2014-09-22 DIAGNOSIS — Z0289 Encounter for other administrative examinations: Secondary | ICD-10-CM

## 2014-09-22 NOTE — Telephone Encounter (Signed)
Pt was scheduled for an appt @9am  and called @8 :30am to see if Dr Deborra Medina had another appt for later in the afternoon. I advised her that we didn't have any other appts, she then said that she wasn't coming in. She felt that she didn't need to bc she only needs a referral for the knot on the back of her neck. I did advise pt of the no show fee with her calling to cancel so close to her appt time. She stated "that is fine. I don't care". She asked me to send a note to see if Dr Deborra Medina can do the referral without her having to come in. Pt asked for a call back.

## 2014-09-23 NOTE — Telephone Encounter (Signed)
Spoke to pt who states that there is a knot on the back of her neck that she believes may be causing her BP to spike. Pt advised per Dr Deborra Medina, that she will need an OV for referral as she has never been seen for this matter. appt scheduled.

## 2014-09-23 NOTE — Telephone Encounter (Signed)
Is she asking for referral to general surgery?

## 2014-09-25 ENCOUNTER — Encounter: Payer: Self-pay | Admitting: Family Medicine

## 2014-09-25 ENCOUNTER — Ambulatory Visit (INDEPENDENT_AMBULATORY_CARE_PROVIDER_SITE_OTHER): Payer: Commercial Managed Care - PPO | Admitting: Family Medicine

## 2014-09-25 ENCOUNTER — Encounter: Payer: Self-pay | Admitting: *Deleted

## 2014-09-25 VITALS — BP 158/102 | HR 81 | Temp 98.1°F | Wt 250.2 lb

## 2014-09-25 DIAGNOSIS — R221 Localized swelling, mass and lump, neck: Secondary | ICD-10-CM

## 2014-09-25 DIAGNOSIS — R220 Localized swelling, mass and lump, head: Secondary | ICD-10-CM | POA: Insufficient documentation

## 2014-09-25 LAB — CBC WITH DIFFERENTIAL/PLATELET
Basophils Absolute: 0 10*3/uL (ref 0.0–0.1)
Basophils Relative: 0.5 % (ref 0.0–3.0)
EOS PCT: 2.7 % (ref 0.0–5.0)
Eosinophils Absolute: 0.1 10*3/uL (ref 0.0–0.7)
HEMATOCRIT: 37.8 % (ref 36.0–46.0)
Hemoglobin: 11.9 g/dL — ABNORMAL LOW (ref 12.0–15.0)
LYMPHS ABS: 1.9 10*3/uL (ref 0.7–4.0)
Lymphocytes Relative: 35.4 % (ref 12.0–46.0)
MCHC: 31.6 g/dL (ref 30.0–36.0)
MCV: 74.5 fl — ABNORMAL LOW (ref 78.0–100.0)
MONOS PCT: 8 % (ref 3.0–12.0)
Monocytes Absolute: 0.4 10*3/uL (ref 0.1–1.0)
NEUTROS PCT: 53.4 % (ref 43.0–77.0)
Neutro Abs: 2.8 10*3/uL (ref 1.4–7.7)
PLATELETS: 227 10*3/uL (ref 150.0–400.0)
RBC: 5.08 Mil/uL (ref 3.87–5.11)
RDW: 15.1 % (ref 11.5–15.5)
WBC: 5.3 10*3/uL (ref 4.0–10.5)

## 2014-09-25 NOTE — Patient Instructions (Signed)
Cervical Adenitis You have a swollen lymph gland in your neck. This commonly happens with Strep and virus infections, dental problems, insect bites, and injuries about the face, scalp, or neck. The lymph glands swell as the body fights the infection or heals the injury. Swelling and firmness typically lasts for several weeks after the infection or injury is healed. Rarely lymph glands can become swollen because of cancer or TB. Antibiotics are prescribed if there is evidence of an infection. Sometimes an infected lymph gland becomes filled with pus. This condition may require opening up the abscessed gland by draining it surgically. Most of the time infected glands return to normal within two weeks. Do not poke or squeeze the swollen lymph nodes. That may keep them from shrinking back to their normal size. If the lymph gland is still swollen after 2 weeks, further medical evaluation is needed.

## 2014-09-25 NOTE — Assessment & Plan Note (Signed)
New- unable to view scalp due to sew in weave. Will get ultrasound for further evaluation, CBC. May need abx. Will await results.

## 2014-09-25 NOTE — Progress Notes (Signed)
Pre visit review using our clinic review tool, if applicable. No additional management support is needed unless otherwise documented below in the visit note. 

## 2014-09-25 NOTE — Progress Notes (Signed)
Subjective:    Patient ID: Grace Gallagher, female    DOB: 26-Jul-1971, 43 y.o.   MRN: [id number]  HPI  Called office yesterday asking for referral for "lump on her neck." Unclear what type of referral she needed or what this lump was, so I asked her to come in today.  On right side, back of neck above hairline, she noticed a subcutaneous mass about a week ago.  Intermittently TTP.  No fever.  Has not been draining anything.  Has never had anything like this before.  No recent scalp infections.  No new hair products.  Current Outpatient Prescriptions on File Prior to Visit  Medication Sig Dispense Refill  . amLODipine (NORVASC) 10 MG tablet Take 1 tablet (10 mg total) by mouth daily.  30 tablet  6  . ferrous sulfate 325 (65 FE) MG tablet Take 1 tablet (325 mg total) by mouth 2 (two) times daily.  60 tablet  3  . fluticasone (FLONASE) 50 MCG/ACT nasal spray Place 2 sprays into the nose 2 (two) times daily.       Marland Kitchen lisinopril-hydrochlorothiazide (PRINZIDE,ZESTORETIC) 20-25 MG per tablet Take 1 tablet by mouth daily.  30 tablet  3  . metoprolol (LOPRESSOR) 50 MG tablet Take 50 mg by mouth 2 (two) times daily as needed.      . Norgestimate-Ethinyl Estradiol Triphasic 0.18/0.215/0.25 MG-35 MCG tablet Take 1 tablet by mouth daily.  1 Package  11  . potassium chloride (K-DUR,KLOR-CON) 10 MEQ tablet Take 1 tablet (10 mEq total) by mouth 2 (two) times daily.  30 tablet  3   No current facility-administered medications on file prior to visit.    Allergies  Allergen Reactions  . Hydrocodone Nausea Only    Past Medical History  Diagnosis Date  . Hypertension   . Anemia     Past Surgical History  Procedure Laterality Date  . Foot surgery    . Tubal ligation    . Breast surgery  08/2011    breast reduction    Family History  Problem Relation Age of Onset  . Hypertension Mother   . Heart failure Mother   . Heart attack Mother     History   Social History  . Marital Status:  Divorced    Spouse Name: N/A    Number of Children: N/A  . Years of Education: N/A   Occupational History  . Not on file.   Social History Main Topics  . Smoking status: Never Smoker   . Smokeless tobacco: Never Used  . Alcohol Use: No  . Drug Use: No  . Sexual Activity: Yes   Other Topics Concern  . Not on file   Social History Narrative   Two children- 26 and 81 yo live with her.   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   Review of Systems  Constitutional: Negative for fever, chills, activity change, appetite change and fatigue.  HENT: Negative for ear pain and facial swelling.   Skin: Negative for rash and wound.  Hematological: Does not bruise/bleed easily.  All other systems reviewed and are negative.      Objective:   Physical Exam  Constitutional: She is oriented to person, place, and time. She appears well-developed and well-nourished. No distress.  HENT:  Head: Normocephalic.  Neck: Normal range of motion. Neck supple. No thyromegaly present.  Lymphadenopathy:  Head (right side): Occipital adenopathy present. No submental, no submandibular, no tonsillar and no preauricular adenopathy present.       Head (left side): No submental, no submandibular, no tonsillar, no preauricular, no posterior auricular and no occipital adenopathy present.  Neurological: She is alert and oriented to person, place, and time.  Skin:  No lice or abrasions to scalp visible but she is wearing a sew in hair weave so difficult to assess  Psychiatric: She has a normal mood and affect. Her behavior is normal. Judgment and thought content normal.          Assessment & Plan:

## 2014-10-01 ENCOUNTER — Ambulatory Visit: Payer: Self-pay | Admitting: Family Medicine

## 2014-10-02 ENCOUNTER — Encounter: Payer: Self-pay | Admitting: Family Medicine

## 2014-10-06 ENCOUNTER — Other Ambulatory Visit: Payer: Self-pay | Admitting: *Deleted

## 2014-10-06 MED ORDER — LISINOPRIL-HYDROCHLOROTHIAZIDE 20-25 MG PO TABS: 1.0000 | ORAL_TABLET | Freq: Every day | ORAL | Status: DC

## 2014-10-14 ENCOUNTER — Encounter: Payer: Self-pay | Admitting: Family Medicine

## 2014-10-14 ENCOUNTER — Ambulatory Visit (INDEPENDENT_AMBULATORY_CARE_PROVIDER_SITE_OTHER): Payer: Commercial Managed Care - PPO | Admitting: Family Medicine

## 2014-10-14 VITALS — BP 138/80 | HR 77 | Temp 98.1°F | Wt 260.0 lb

## 2014-10-14 DIAGNOSIS — L290 Pruritus ani: Secondary | ICD-10-CM | POA: Insufficient documentation

## 2014-10-14 MED ORDER — HYDROCORTISONE ACETATE 25 MG RE SUPP: 25.0000 mg | Freq: Two times a day (BID) | RECTAL | Status: DC

## 2014-10-14 NOTE — Assessment & Plan Note (Signed)
New- persistent. Discussed possible causes of anal pruritis.  I cannot rule out internal hemorrhoid- will try [facility] suppositories for no longer than a week to see if helps. Unlikely pinworm given age and description of symptoms. Advised using soaps without perfume, taking sitz baths.   Given list of foods that can exacerbate anal itching. Call or return to clinic prn if these symptoms worsen or fail to improve as anticipated- will refer to GI for anoscope/colonoscopy. The patient indicates understanding of these issues and agrees with the plan.

## 2014-10-14 NOTE — Progress Notes (Signed)
Subjective:    Patient ID: Grace Gallagher, female    DOB: 10-May-1971, 43 y.o.   MRN: [id number]  HPI  43 yo here for "several months" of anal itching.  Occurs mainly during the day while at work, has not noticed symptoms in evening. Also notices that she "cannot get clean" when she wipes. Did one time have blood in her toilet paper when she wiped. Has had more frequent BMs lately. No blood in stool or black stools. No abdominal pain. Not having anal sex.  Does not wear tight clothes.  No new soaps or detergents.  Current Outpatient Prescriptions on File Prior to Visit  Medication Sig Dispense Refill  . amLODipine (NORVASC) 10 MG tablet Take 1 tablet (10 mg total) by mouth daily. 30 tablet 6  . ferrous sulfate 325 (65 FE) MG tablet Take 1 tablet (325 mg total) by mouth 2 (two) times daily. 60 tablet 3  . fluticasone (FLONASE) 50 MCG/ACT nasal spray Place 2 sprays into the nose 2 (two) times daily.     Marland Kitchen lisinopril-hydrochlorothiazide (PRINZIDE,ZESTORETIC) 20-25 MG per tablet Take 1 tablet by mouth daily. 30 tablet 3  . metoprolol (LOPRESSOR) 50 MG tablet Take 50 mg by mouth 2 (two) times daily as needed.    . Norgestimate-Ethinyl Estradiol Triphasic 0.18/0.215/0.25 MG-35 MCG tablet Take 1 tablet by mouth daily. 1 Package 11  . potassium chloride (K-DUR,KLOR-CON) 10 MEQ tablet Take 1 tablet (10 mEq total) by mouth 2 (two) times daily. 30 tablet 3   No current facility-administered medications on file prior to visit.    Allergies  Allergen Reactions  . Hydrocodone Nausea Only    Past Medical History  Diagnosis Date  . Hypertension   . Anemia     Past Surgical History  Procedure Laterality Date  . Foot surgery    . Tubal ligation    . Breast surgery  08/2011    breast reduction    Family History  Problem Relation Age of Onset  . Hypertension Mother   . Heart failure Mother   . Heart attack Mother     History   Social History  . Marital Status: Single   Spouse Name: N/A    Number of Children: N/A  . Years of Education: N/A   Occupational History  . Not on file.   Social History Main Topics  . Smoking status: Never Smoker   . Smokeless tobacco: Never Used  . Alcohol Use: No  . Drug Use: No  . Sexual Activity: Yes   Other Topics Concern  . Not on file   Social History Narrative   Two children- 78 and 6 yo live with her.   CNA at [employer].   The PMH, PSH, Social History, Family History, Medications, and allergies have been reviewed in [facility], and have been updated if relevant.   Review of Systems  Constitutional: Negative.   HENT: Negative.   Respiratory: Negative.   Cardiovascular: Negative.   Genitourinary: Negative.   Hematological: Negative.   Psychiatric/Behavioral: Negative.        Objective:   Physical Exam  Constitutional: She appears well-developed and well-nourished. No distress.  Abdominal: Soft. Bowel sounds are normal.  Genitourinary: Rectum normal. Rectal exam shows no external hemorrhoid, no fissure, no tenderness and anal tone normal. Guaiac negative stool.  Skin: Skin is warm and dry.  Psychiatric: She has a normal mood and affect. Her behavior is normal. Judgment and thought content normal.   BP 138/80 mmHg  Pulse 77  Temp(Src) 98.1 F (36.7 C) (Oral)  Wt 260 lb (117.935 kg)  SpO2 98%  LMP 10/01/2014 (Within Days)        Assessment & Plan:

## 2014-10-14 NOTE — Patient Instructions (Signed)
Try to avoid foods in the handout. Take warm baths or sitz baths Ok to try suppositories. Please update me.

## 2014-10-14 NOTE — Progress Notes (Signed)
Pre visit review using our clinic review tool, if applicable. No additional management support is needed unless otherwise documented below in the visit note. 

## 2015-02-06 ENCOUNTER — Ambulatory Visit: Payer: Commercial Managed Care - PPO | Admitting: Family Medicine

## 2015-02-19 ENCOUNTER — Ambulatory Visit (INDEPENDENT_AMBULATORY_CARE_PROVIDER_SITE_OTHER): Payer: Commercial Managed Care - PPO | Admitting: Family Medicine

## 2015-02-19 ENCOUNTER — Encounter: Payer: Self-pay | Admitting: Family Medicine

## 2015-02-19 VITALS — BP 136/84 | HR 74 | Temp 98.4°F | Wt 264.0 lb

## 2015-02-19 DIAGNOSIS — L290 Pruritus ani: Secondary | ICD-10-CM

## 2015-02-19 LAB — CBC WITH DIFFERENTIAL/PLATELET
BASOS ABS: 0 10*3/uL (ref 0.0–0.1)
Basophils Relative: 0.4 % (ref 0.0–3.0)
EOS ABS: 0.2 10*3/uL (ref 0.0–0.7)
Eosinophils Relative: 3.8 % (ref 0.0–5.0)
HEMATOCRIT: 33.9 % — AB (ref 36.0–46.0)
Hemoglobin: 10.5 g/dL — ABNORMAL LOW (ref 12.0–15.0)
Lymphocytes Relative: 33.6 % (ref 12.0–46.0)
Lymphs Abs: 1.8 10*3/uL (ref 0.7–4.0)
MCHC: 31 g/dL (ref 30.0–36.0)
MCV: 73.6 fl — ABNORMAL LOW (ref 78.0–100.0)
MONO ABS: 0.5 10*3/uL (ref 0.1–1.0)
MONOS PCT: 9.4 % (ref 3.0–12.0)
NEUTROS ABS: 2.8 10*3/uL (ref 1.4–7.7)
NEUTROS PCT: 52.8 % (ref 43.0–77.0)
Platelets: 207 10*3/uL (ref 150.0–400.0)
RBC: 4.61 Mil/uL (ref 3.87–5.11)
RDW: 16.3 % — ABNORMAL HIGH (ref 11.5–15.5)
WBC: 5.2 10*3/uL (ref 4.0–10.5)

## 2015-02-19 LAB — COMPREHENSIVE METABOLIC PANEL
ALT: 20 U/L (ref 0–35)
AST: 25 U/L (ref 0–37)
Albumin: 3.8 g/dL (ref 3.5–5.2)
Alkaline Phosphatase: 48 U/L (ref 39–117)
BILIRUBIN TOTAL: 0.5 mg/dL (ref 0.2–1.2)
BUN: 12 mg/dL (ref 6–23)
CO2: 27 mEq/L (ref 19–32)
CREATININE: 0.85 mg/dL (ref 0.40–1.20)
Calcium: 9 mg/dL (ref 8.4–10.5)
Chloride: 106 mEq/L (ref 96–112)
GFR: 77.45 mL/min (ref >60.00)
Glucose, Bld: 81 mg/dL (ref 70–99)
Potassium: 3.7 mEq/L (ref 3.5–5.1)
Sodium: 138 mEq/L (ref 135–145)
Total Protein: 6.6 g/dL (ref 6.0–8.3)

## 2015-02-19 LAB — TSH: TSH: 1.68 u[IU]/mL (ref 0.35–4.50)

## 2015-02-19 NOTE — Progress Notes (Signed)
   Subjective:   Patient ID: Carma Leaven Grecco, female    DOB: 03/30/1971, 44 y.o.   MRN: [id number]  Brynli Ollis Latterell is a pleasant 44 y.o. year old female who presents to clinic today with Anal Itching  on 02/19/2015  HPI:  Initially saw her on 10/14/14 for this complaint.  At that time, had several months of anal itching during the day.  No blood in stool or anal pain.  Not having anal sex. Trial of HC suppositories have not helped. Advised sitz baths, not using soaps with perfume.  None of this has helped. Now mainly having symptoms at night but itching is worse. No one else in her house is having similar symptoms.    Lab Results  Component Value Date   ALT 13 05/30/2014   AST 13 05/30/2014   ALKPHOS 47 05/30/2014   BILITOT 0.4 05/30/2014   Lab Results  Component Value Date   WBC 5.3 09/25/2014   HGB 11.9* 09/25/2014   HCT 37.8 09/25/2014   MCV 74.5* 09/25/2014   PLT 227.0 09/25/2014   No results found for: HIV1X2 Lab Results  Component Value Date   TSH 1.399 05/30/2014     Review of Systems  Constitutional: Negative.   Gastrointestinal: Negative for nausea, vomiting, abdominal pain, diarrhea, constipation, blood in stool, abdominal distention, anal bleeding and rectal pain.  Musculoskeletal: Negative.   Skin: Negative.   Psychiatric/Behavioral: Negative.   All other systems reviewed and are negative.      Objective:    BP 136/84 mmHg  Pulse 74  Temp(Src) 98.4 F (36.9 C) (Oral)  Wt 264 lb (119.75 kg)  SpO2 97%  LMP 02/19/2015   Physical Exam  Constitutional: She is oriented to person, place, and time. She appears well-developed and well-nourished. No distress.  HENT:  Head: Normocephalic.  Eyes: Conjunctivae are normal.  Cardiovascular: Normal rate.   Pulmonary/Chest: Effort normal.  Abdominal: Soft. Bowel sounds are normal. She exhibits no distension and no mass. There is no tenderness. There is no rebound and no guarding.  Genitourinary: Rectum  normal. Rectal exam shows no external hemorrhoid, no internal hemorrhoid, no fissure, no mass, no tenderness and anal tone normal. Guaiac negative stool.  Neurological: She is alert and oriented to person, place, and time. No cranial nerve deficit.  Skin: Skin is warm and dry.  Psychiatric: She has a normal mood and affect. Her behavior is normal. Judgment and thought content normal.  Nursing note and vitals reviewed.         Assessment & Plan:   Anal itching - Plan: CBC with Differential/Platelet, Comprehensive metabolic panel, HIV antibody (with reflex), TSH No Follow-up on file.

## 2015-02-19 NOTE — Assessment & Plan Note (Signed)
Persistent issue. Nocturnal symptoms- although not as common in adults, I do now question pinworm. Will check labs today, advised PinX OTC for herself and her family. If symptoms persistent in 2 weeks, refer to GI. The patient indicates understanding of these issues and agrees with the plan.  Orders Placed This Encounter  Procedures  . CBC with Differential/Platelet  . Comprehensive metabolic panel  . HIV antibody (with reflex)  . TSH

## 2015-02-19 NOTE — Patient Instructions (Signed)
Pinworms Your caregiver has diagnosed you as having pinworms. These are common infections of children and less common in adults. Pinworms are a small white worm less one quarter to a half inch in length. They look like a tiny piece of white thread. A person gets pinworms by swallowing the eggs of the worm. These eggs are obtained from contaminated (infected or tainted) food, clothing, toys, or any object that comes in contact with the body and mouth. The eggs hatch in the small bowel (intestine) and quickly develop into adult worms in the large bowel (colon). The female worm develops in the large intestine for about two to four weeks. It lays eggs around the anus during the night. These eggs then contaminate clothing, fingers, bedding, and anything else they come in contact with. The main symptoms (problems) of pinworms are itching around the anus (pruritus ani) at night. Children may also have occasional abdominal (belly) pain, loss of appetite, problems sleeping, and irritability. If you or your child has continual anal itching at night, that is a good sign to consult your caregiver. Just about everybody at some time in their life has acquired pinworms. Getting them has nothing to do with the cleanliness of your household or your personal hygiene. Complications are uncommon. DIAGNOSIS  Diagnosis can be made by looking at your child's anus at night when the pinworms are laying eggs or by sticking a piece of scotch tape on the anus in the morning. The eggs will stick to the tape. This can be examined by your caregiver who can make a diagnosis by looking at the tape under a microscope. Sometimes several scotch tape swabs will be necessary.  HOME CARE INSTRUCTIONS   Your caregiver will give you medications. They should be taken as directed. Eggs are easily passed. The whole family often needs treatment even if no symptoms are present. Several treatments may be necessary. A second treatment is usually needed  after two weeks to a month.  Maintain strict hygiene. Washing hands often and keeping the nails short is helpful. Children often scratch themselves at night in their sleep so the eggs get under the nail. This causes reinfection by hand to mouth contamination.  Change bedding and clothing daily. These should be washed in hot water and dried. This kills the eggs and stops the life cycle of the worm.  Pets are not known to carry pinworms.  An ointment may be used at night for anal itching.  See your caregiver if problems continue. Document Released: 11/18/2000 Document Revised: 02/13/2012 Document Reviewed: 11/18/2008 [facility] Patient Information 2015 [place], [place]. This information is not intended to replace advice given to you by your health care provider. Make sure you discuss any questions you have with your health care provider.  PIN- X over the counter

## 2015-02-19 NOTE — Progress Notes (Signed)
Pre visit review using our clinic review tool, if applicable. No additional management support is needed unless otherwise documented below in the visit note. 

## 2015-02-20 LAB — HIV ANTIBODY (ROUTINE TESTING W REFLEX): HIV: NONREACTIVE

## 2015-02-23 ENCOUNTER — Telehealth: Payer: Self-pay | Admitting: Family Medicine

## 2015-02-23 NOTE — Telephone Encounter (Signed)
See lab results.  

## 2015-02-23 NOTE — Telephone Encounter (Signed)
Pt returned your call and requests a c/b. Thank you.

## 2015-03-06 ENCOUNTER — Encounter (HOSPITAL_COMMUNITY): Payer: Self-pay | Admitting: Emergency Medicine

## 2015-03-06 ENCOUNTER — Emergency Department (HOSPITAL_COMMUNITY)
Admission: EM | Admit: 2015-03-06 | Discharge: 2015-03-06 | Disposition: A | Discharge status: Home / Self Care | Payer: Commercial Managed Care - PPO | Attending: Emergency Medicine | Admitting: Emergency Medicine

## 2015-03-06 DIAGNOSIS — I10 Essential (primary) hypertension: Secondary | ICD-10-CM | POA: Diagnosis not present

## 2015-03-06 DIAGNOSIS — Z79899 Other long term (current) drug therapy: Secondary | ICD-10-CM | POA: Diagnosis not present

## 2015-03-06 DIAGNOSIS — K6289 Other specified diseases of anus and rectum: Secondary | ICD-10-CM | POA: Diagnosis present

## 2015-03-06 DIAGNOSIS — D649 Anemia, unspecified: Secondary | ICD-10-CM | POA: Insufficient documentation

## 2015-03-06 DIAGNOSIS — L290 Pruritus ani: Secondary | ICD-10-CM | POA: Diagnosis not present

## 2015-03-06 MED ORDER — HYDROCORTISONE 1 % EX CREA: TOPICAL_CREAM | CUTANEOUS | Status: DC

## 2015-03-06 NOTE — ED Provider Notes (Signed)
CSN: [id number]     Arrival date & time 03/06/15  1549 History   First MD Initiated Contact with Patient 03/06/15 1725     Chief Complaint  Patient presents with  . Rectal Pain    HPI   44 year old female presents with one year history of anal pruritis. Patient reports that she's been seen by her primary care provider twice for some receiving oral suppositories second time receiving another oral medication for it she can't recall. She believes her provider thought that it may be "pinworms, was treating her for such. She reported improvement in his symptoms for one day with treatment or return immediately afterwards. Reports she has an appointment with her on Tuesday but the itching has continued to persist. Shorts today that she was scratching her anus with toilet paper and noticed a streak of bright red blood. She denies any history of rectal bleeding, or other signs of blood with bowel movements aside from this incident. Patient reports that she's been scratching more frequently. She does not have any other acute concerns at this time, denies upper respiratory, chest pain, abdominal pain, nausea vomiting, diarrhea, rashes, skin disorders. She reports the itching is only around her rectum.  e Past Medical History  Diagnosis Date  . Hypertension   . Anemia    Past Surgical History  Procedure Laterality Date  . Foot surgery    . Tubal ligation    . Breast surgery  08/2011    breast reduction   Family History  Problem Relation Age of Onset  . Hypertension Mother   . Heart failure Mother   . Heart attack Mother    History  Substance Use Topics  . Smoking status: Never Smoker   . Smokeless tobacco: Never Used  . Alcohol Use: No   OB History    No data available     Review of Systems  All other systems reviewed and are negative.   Allergies  Hydrocodone  Home Medications   Prior to Admission medications   Medication Sig Start Date End Date Taking? Authorizing Provider   amLODipine (NORVASC) 10 MG tablet Take 1 tablet (10 mg total) by mouth daily. 09/10/14  Yes Lucille Passy, MD  ferrous sulfate 325 (65 FE) MG tablet Take 325 mg by mouth 2 (two) times daily with a meal.   Yes Historical Provider, MD  lisinopril-hydrochlorothiazide (PRINZIDE,ZESTORETIC) 20-25 MG per tablet Take 1 tablet by mouth daily. 10/06/14  Yes Lucille Passy, MD  metoprolol (LOPRESSOR) 50 MG tablet Take 50 mg by mouth 2 (two) times daily as needed (blood pressure.).  04/09/14  Yes Raquel Dagoberto Ligas, NP  hydrocortisone (ANUSOL-HC) 25 MG suppository Place 1 suppository (25 mg total) rectally 2 (two) times daily. Patient not taking: Reported on 03/06/2015 10/14/14   Lucille Passy, MD  Norgestimate-Ethinyl Estradiol Triphasic 0.18/0.215/0.25 MG-35 MCG tablet Take 1 tablet by mouth daily. Patient not taking: Reported on 03/06/2015 05/30/14   Lucille Passy, MD   BP 160/92 mmHg  Pulse 71  Temp(Src) 98.7 F (37.1 C) (Oral)  Resp 18  SpO2 98%  LMP 02/07/2015 Physical Exam  Constitutional: She is oriented to person, place, and time. She appears well-developed and well-nourished.  HENT:  Head: Normocephalic and atraumatic.  Eyes: Pupils are equal, round, and reactive to light.  Neck: Normal range of motion. Neck supple. No JVD present. No tracheal deviation present. No thyromegaly present.  Cardiovascular: Normal rate, regular rhythm, normal heart sounds and intact distal pulses.  Exam reveals no gallop and no friction rub.   No murmur heard. Pulmonary/Chest: Effort normal and breath sounds normal. No stridor. No respiratory distress. She has no wheezes. She has no rales. She exhibits no tenderness.  Abdominal: Soft. She exhibits no distension and no mass. There is no tenderness. There is no rebound and no guarding.  Genitourinary:  Small abrasion to anus no signs of active bleeding. No abnormalities on rectal exam, no gross blood on finger, no hemorrhoids. No signs of excoriation, or dermatitis of surrounding  skin.  Musculoskeletal: Normal range of motion.  Lymphadenopathy:    She has no cervical adenopathy.  Neurological: She is alert and oriented to person, place, and time. Coordination normal.  Skin: Skin is warm and dry.  Psychiatric: She has a normal mood and affect. Her behavior is normal. Judgment and thought content normal.  Nursing note and vitals reviewed.    ED Course  Procedures (including critical care time) Labs Review Labs Reviewed - No data to display  Imaging Review No results found.   EKG Interpretation None     MDM   Final diagnoses:  Anal pruritus    Assessment: Anal pruritus  Plan: Patient has had pruritus for over one year now, she's been seen by her primary care who has been managing it. She has a follow-up appointment on Tuesday of the upcoming week. Patient's exam was unremarkable aside from one minor abrasion to her anus most likely due to her scratching. This is likely the source of her streaking on the toilet paper today. A rectal exam was performed with no concerning findings. Patient was given hydrocortisone cream with instructions to not use it for greater than 2 weeks, she understood the risks of using a longer than that. Patient was instructed to follow-up as previous scheduled with her primary care provider for further evaluation and management of this chronic condition. Patient had no further concerns at time of discharge, she understood and agreed to the plan.      Okey Regal, PA-C 03/07/15 1454  Daleen Bo, MD 03/08/15 1114

## 2015-03-06 NOTE — ED Notes (Signed)
Pt from home c/o rectal pain and itching. She reports BRB after scratching. She has been seen at PCP for same and reports her one time dose medication worked but now she is having symptoms.

## 2015-03-06 NOTE — Discharge Instructions (Signed)
Anal Pruritus Anal pruritus is an itching of the anus, which is often due to increased moisture of the skin around the anus. Moisture may be due to sweating or a small amount of remaining stool. The itching and scratching can cause further skin damage.  CAUSES   Poor hygiene.  Excessive moisture from sweating or residual stool in the anal area.  Perfumed soaps and sprays and colored toilet paper.  Chemicals in the foods you eat.  Dietary factors such as caffeine, beer, milk products, chocolate, nuts, citrus fruits, tomatoes, spicy seasonings, jalapeno peppers, and salsa.  Hemorrhoids, infections, and other anal diseases.  Excessive washing.  Overuse of laxatives.  Skin disorders (psoriasis, eczema, or seborrhea). HOME CARE INSTRUCTIONS   Practice good hygiene.  Clean the anal area gently with wet toilet paper, baby wipes, or a wet washcloth after every bowel movement and at bedtime. Avoid using soaps on the anal area. Dry the area thoroughly. Pat the area dry with toilet paper or a towel.  Do not scrub the anal area with anything, even toilet paper.  Try not to scratch the itchy area. Scratching produces more damage, which makes the itching worse.  Take sitz baths in warm water for 15 to 20 minutes, 2 to 3 times a day. Pat the area dry with a soft cloth after each bath.  Zinc oxide ointment or a moisture barrier cream can be applied several times daily to protect the skin.  Only take medicines as directed by your caregiver.  Talk to your caregiver about fiber supplements. These are helpful in normalizing the stool if you have frequent loose stools.  Wear cotton underwear and loose clothing.  Do not use irritants such as bubble baths, scented toilet paper, or genital deodorants. SEEK MEDICAL CARE IF:   Itching does not improve in several days or gets worse.  You have a fever.  There are problems with increased pain, swelling, or redness. MAKE SURE YOU:   Understand  these instructions.  Will watch your condition.  Will get help right away if you are not doing well or get worse. Document Released: 05/23/2011 Document Revised: 02/13/2012 Document Reviewed: 05/23/2011 [facility] Patient Information 2015 [place], [place]. This information is not intended to replace advice given to you by your health care provider. Make sure you discuss any questions you have with your health care provider.  Please use medication as directed. Please follow-up with primary care provider as previous scheduled this upcoming Tuesday. Monitor for new or worsening signs or symptoms. Please do not use hydrocortisone cream longer than 2 weeks as it can cause atrophy of the skin.

## 2015-03-09 ENCOUNTER — Ambulatory Visit: Payer: Commercial Managed Care - PPO | Admitting: Family Medicine

## 2015-03-10 ENCOUNTER — Encounter: Payer: Self-pay | Admitting: Family Medicine

## 2015-03-10 ENCOUNTER — Ambulatory Visit (INDEPENDENT_AMBULATORY_CARE_PROVIDER_SITE_OTHER): Payer: Commercial Managed Care - PPO | Admitting: Family Medicine

## 2015-03-10 VITALS — BP 122/74 | HR 75 | Temp 98.1°F | Resp 18 | Wt 266.4 lb

## 2015-03-10 DIAGNOSIS — L290 Pruritus ani: Secondary | ICD-10-CM | POA: Diagnosis not present

## 2015-03-10 NOTE — Patient Instructions (Signed)
Good to see you. Let's repeat PinX, start zinc oxide cream and benadryl at bedtime. We are referring to a gastroenterologist.

## 2015-03-10 NOTE — Progress Notes (Signed)
   Subjective:   Patient ID: Grace Gallagher, female    DOB: 06-30-1971, 44 y.o.   MRN: [id number]  Grace Gallagher is a pleasant 44 y.o. year old female who presents to clinic today with Anal Itching  on 03/10/2015  HPI:  Initially saw her on 10/14/14 for this complaint.  At that time, had several months of anal itching during the day.  No blood in stool or anal pain.  Not having anal sex. Trial of HC suppositories have not helped. Advised sitz baths, not using soaps with perfume.  None of this had helped.  I then saw her on 3/17 for persistent symptoms.  Since symptoms were then becoming mainly nocturnal, we tried PinX OTC and ordered labs.    Symptoms improved initially after taking Pin X but then returned again and she went to the ED for this over the weekend. Note reviewed from 4/1- exam unremarkable- given rx for hydrocortisone cream and advised to follow up with me here today.      Lab Results  Component Value Date   ALT 20 02/19/2015   AST 25 02/19/2015   ALKPHOS 48 02/19/2015   BILITOT 0.5 02/19/2015   Lab Results  Component Value Date   WBC 5.2 02/19/2015   HGB 10.5* 02/19/2015   HCT 33.9* 02/19/2015   MCV 73.6* 02/19/2015   PLT 207.0 02/19/2015   No results found for: HIV1X2 Lab Results  Component Value Date   TSH 1.68 02/19/2015     Review of Systems  Constitutional: Negative.   Gastrointestinal: Negative for nausea, vomiting, abdominal pain, diarrhea, constipation, blood in stool, abdominal distention, anal bleeding and rectal pain.  Musculoskeletal: Negative.   Skin: Negative.   Psychiatric/Behavioral: Negative.   All other systems reviewed and are negative.      Objective:    BP 122/74 mmHg  Pulse 75  Temp(Src) 98.1 F (36.7 C) (Oral)  Resp 18  Wt 266 lb 6.4 oz (120.838 kg)  SpO2 97%  LMP 01/26/2015   Physical Exam  Constitutional: She is oriented to person, place, and time. She appears well-developed and well-nourished. No distress.  HENT:   Head: Normocephalic.  Eyes: Conjunctivae are normal.  Cardiovascular: Normal rate.   Pulmonary/Chest: Effort normal.  Abdominal: Soft. Bowel sounds are normal. She exhibits no distension and no mass. There is no tenderness. There is no rebound and no guarding.  Genitourinary: Rectum normal. Rectal exam shows no external hemorrhoid, no internal hemorrhoid, no fissure, no mass, no tenderness and anal tone normal. Guaiac negative stool.  Neurological: She is alert and oriented to person, place, and time. No cranial nerve deficit.  Skin: Skin is warm and dry.  Psychiatric: She has a normal mood and affect. Her behavior is normal. Judgment and thought content normal.  Nursing note and vitals reviewed.         Assessment & Plan:   Anal itching No Follow-up on file.

## 2015-03-10 NOTE — Assessment & Plan Note (Signed)
Persistent- still worse at night. Advised repeat Pin X dosage, stop steroid cream. Use zinc oxide cream, benadryl at bedtime and will refer to GI. The patient indicates understanding of these issues and agrees with the plan.

## 2015-03-26 ENCOUNTER — Encounter: Payer: Self-pay | Admitting: Family Medicine

## 2015-03-26 ENCOUNTER — Ambulatory Visit (INDEPENDENT_AMBULATORY_CARE_PROVIDER_SITE_OTHER): Payer: Commercial Managed Care - PPO | Admitting: Family Medicine

## 2015-03-26 VITALS — BP 158/84 | HR 88 | Temp 98.3°F | Wt 264.0 lb

## 2015-03-26 DIAGNOSIS — R609 Edema, unspecified: Secondary | ICD-10-CM | POA: Insufficient documentation

## 2015-03-26 DIAGNOSIS — R0602 Shortness of breath: Secondary | ICD-10-CM

## 2015-03-26 NOTE — Assessment & Plan Note (Signed)
New- with SOB and + FH of heart failure. Does not seem consistent with CHF based on exam but will order BNP and CXR today for reassurance.  Likely due to obesity, increased salt intake and being on her feet all day without her legs elevated. Advised cutting back on salt, keeping legs elevated, working on weight loss. The patient indicates understanding of these issues and agrees with the plan.

## 2015-03-26 NOTE — Progress Notes (Signed)
Subjective:   Patient ID: Grace Gallagher, female    DOB: December 01, 1971, 44 y.o.   MRN: [id number]  Grace Gallagher is a pleasant 44 y.o. year old female who presents to clinic today with Edema and Headache  on 03/26/2015  HPI:  44 yo with no h/o CHF here for edema and pain in her legs x 3 days.  No new rxs.   Feels like she has been more SOB. No orthopnea.  Mom had heart failure and died in her 50s.   Has been using more salt in her diet lately.    Grace Gallagher is worried she now has heart failure. No CP.  She is on her feet all day.  Swelling worse at the end of the day, minimal when she first wakes up in the morning.  No recent weight gain in past few weeks but she has been gradually gaining weight over past couple of years.  Takes Prinzide 20-25 mg daily for HTN- has not been missing doses. Lab Results  Component Value Date   ALT 20 02/19/2015   AST 25 02/19/2015   ALKPHOS 48 02/19/2015   BILITOT 0.5 02/19/2015   Lab Results  Component Value Date   WBC 5.2 02/19/2015   HGB 10.5* 02/19/2015   HCT 33.9* 02/19/2015   MCV 73.6* 02/19/2015   PLT 207.0 02/19/2015   Lab Results  Component Value Date   TSH 1.68 02/19/2015   Lab Results  Component Value Date   CREATININE 0.85 02/19/2015   Current Outpatient Prescriptions on File Prior to Visit  Medication Sig Dispense Refill  . amLODipine (NORVASC) 10 MG tablet Take 1 tablet (10 mg total) by mouth daily. 30 tablet 6  . ferrous sulfate 325 (65 FE) MG tablet Take 325 mg by mouth 2 (two) times daily with a meal.    . hydrocortisone (ANUSOL-HC) 25 MG suppository Place 1 suppository (25 mg total) rectally 2 (two) times daily. 12 suppository 0  . hydrocortisone cream 1 % Apply to affected area 2 times daily 15 g 0  . lisinopril-hydrochlorothiazide (PRINZIDE,ZESTORETIC) 20-25 MG per tablet Take 1 tablet by mouth daily. 30 tablet 3  . metoprolol (LOPRESSOR) 50 MG tablet Take 50 mg by mouth 2 (two) times daily as needed (blood pressure.).      Marland Kitchen Norgestimate-Ethinyl Estradiol Triphasic 0.18/0.215/0.25 MG-35 MCG tablet Take 1 tablet by mouth daily. 1 Package 11   No current facility-administered medications on file prior to visit.    Allergies  Allergen Reactions  . Hydrocodone Nausea Only    Past Medical History  Diagnosis Date  . Hypertension   . Anemia     Past Surgical History  Procedure Laterality Date  . Foot surgery    . Tubal ligation    . Breast surgery  08/2011    breast reduction    Family History  Problem Relation Age of Onset  . Hypertension Mother   . Heart failure Mother   . Heart attack Mother     History   Social History  . Marital Status: Single    Spouse Name: N/A  . Number of Children: N/A  . Years of Education: N/A   Occupational History  . Not on file.   Social History Main Topics  . Smoking status: Never Smoker   . Smokeless tobacco: Never Used  . Alcohol Use: No  . Drug Use: No  . Sexual Activity: Yes   Other Topics Concern  . Not on file   Social  History Narrative   Two children- 83 and 28 yo live with her.   CNA at [employer].   The PMH, PSH, Social History, Family History, Medications, and allergies have been reviewed in [facility], and have been updated if relevant.  Review of Systems  Constitutional: Negative.   Respiratory: Positive for shortness of breath. Negative for apnea, cough, choking, chest tightness, wheezing and stridor.   Cardiovascular: Positive for leg swelling. Negative for chest pain and palpitations.  Genitourinary: Negative.   Musculoskeletal: Negative.   Neurological: Positive for headaches. Negative for dizziness and facial asymmetry.  Psychiatric/Behavioral: Negative.   All other systems reviewed and are negative.      Objective:    BP 158/84 mmHg  Pulse 88  Temp(Src) 98.3 F (36.8 C) (Oral)  Wt 264 lb (119.75 kg)  SpO2 96%  LMP 01/26/2015  Wt Readings from Last 3 Encounters:  03/26/15 264 lb (119.75 kg)  03/10/15 266 lb 6.4 oz  (120.838 kg)  02/19/15 264 lb (119.75 kg)    Physical Exam  Constitutional: She is oriented to person, place, and time. She appears well-developed and well-nourished. No distress.  HENT:  Head: Normocephalic and atraumatic.  Eyes: Conjunctivae are normal.  Neck: Normal range of motion.  Cardiovascular: Normal rate and regular rhythm.   Pulmonary/Chest: Effort normal and breath sounds normal.  Musculoskeletal:  No pitting edema- thick ankles  Neurological: She is alert and oriented to person, place, and time. No cranial nerve deficit.  Skin: Skin is warm and dry.  Psychiatric: She has a normal mood and affect. Her behavior is normal. Judgment and thought content normal.  Nursing note and vitals reviewed.         Assessment & Plan:   Edema - Plan: Brain natriuretic peptide, DG Chest 2 View  SOB (shortness of breath) - Plan: Brain natriuretic peptide, DG Chest 2 View No Follow-up on file.

## 2015-03-26 NOTE — Patient Instructions (Signed)
Good to see you. Please decrease your salt intake.  Keep your legs elevated.  We will call you with your results from today.

## 2015-03-26 NOTE — Progress Notes (Signed)
Pre visit review using our clinic review tool, if applicable. No additional management support is needed unless otherwise documented below in the visit note. 

## 2015-03-27 ENCOUNTER — Ambulatory Visit (INDEPENDENT_AMBULATORY_CARE_PROVIDER_SITE_OTHER)
Admission: RE | Admit: 2015-03-27 | Discharge: 2015-03-27 | Disposition: A | Discharge status: Home / Self Care | Payer: Commercial Managed Care - PPO | Source: Ambulatory Visit | Attending: Family Medicine | Admitting: Family Medicine

## 2015-03-27 ENCOUNTER — Other Ambulatory Visit (INDEPENDENT_AMBULATORY_CARE_PROVIDER_SITE_OTHER): Payer: Commercial Managed Care - PPO

## 2015-03-27 DIAGNOSIS — R0602 Shortness of breath: Secondary | ICD-10-CM | POA: Diagnosis not present

## 2015-03-27 DIAGNOSIS — R609 Edema, unspecified: Secondary | ICD-10-CM

## 2015-03-27 LAB — BRAIN NATRIURETIC PEPTIDE: Pro B Natriuretic peptide (BNP): 9 pg/mL (ref 0.0–100.0)

## 2015-05-21 ENCOUNTER — Telehealth: Payer: Self-pay | Admitting: Family Medicine

## 2015-05-21 ENCOUNTER — Ambulatory Visit: Payer: Commercial Managed Care - PPO | Admitting: Family Medicine

## 2015-05-21 DIAGNOSIS — Z0289 Encounter for other administrative examinations: Secondary | ICD-10-CM

## 2015-05-21 NOTE — Telephone Encounter (Signed)
Patient did not come in for their appointment today for abdominal pressure, discharge.  Please let me know if patient needs to be contacted immediately for follow up or no follow up needed.

## 2015-05-27 ENCOUNTER — Telehealth: Payer: Self-pay | Admitting: Family Medicine

## 2015-05-27 ENCOUNTER — Encounter: Payer: Self-pay | Admitting: Family Medicine

## 2015-05-27 ENCOUNTER — Ambulatory Visit: Payer: Commercial Managed Care - PPO | Admitting: Family Medicine

## 2015-05-27 DIAGNOSIS — Z0289 Encounter for other administrative examinations: Secondary | ICD-10-CM

## 2015-05-27 NOTE — Telephone Encounter (Signed)
She is being discharged from our practice due to multiple no shows.

## 2015-05-27 NOTE — Telephone Encounter (Signed)
Patient did not come in for their appointment today for poss uti.  Please let me know if patient needs to be contacted immediately for follow up or no follow up needed.

## 2015-05-27 NOTE — Telephone Encounter (Signed)
Patient dismissed from [facility] by Arnette Norris MD , effective May 27, 2015. Dismissal letter sent out by certified / registered mail.  DAJ  Received signed domestic return receipt verifying delivery of certified letter on June 01, 2015. Article number [id number]

## 2015-06-06 DIAGNOSIS — Z793 Long term (current) use of hormonal contraceptives: Secondary | ICD-10-CM | POA: Insufficient documentation

## 2015-06-06 DIAGNOSIS — Z79899 Other long term (current) drug therapy: Secondary | ICD-10-CM | POA: Diagnosis not present

## 2015-06-06 DIAGNOSIS — R1033 Periumbilical pain: Secondary | ICD-10-CM | POA: Diagnosis present

## 2015-06-06 DIAGNOSIS — Z3202 Encounter for pregnancy test, result negative: Secondary | ICD-10-CM | POA: Diagnosis not present

## 2015-06-06 DIAGNOSIS — I10 Essential (primary) hypertension: Secondary | ICD-10-CM | POA: Diagnosis not present

## 2015-06-06 DIAGNOSIS — R1030 Lower abdominal pain, unspecified: Secondary | ICD-10-CM | POA: Insufficient documentation

## 2015-06-06 DIAGNOSIS — D649 Anemia, unspecified: Secondary | ICD-10-CM | POA: Insufficient documentation

## 2015-06-06 DIAGNOSIS — R319 Hematuria, unspecified: Secondary | ICD-10-CM | POA: Diagnosis not present

## 2015-06-06 DIAGNOSIS — E669 Obesity, unspecified: Secondary | ICD-10-CM | POA: Insufficient documentation

## 2015-06-06 DIAGNOSIS — Z7952 Long term (current) use of systemic steroids: Secondary | ICD-10-CM | POA: Insufficient documentation

## 2015-06-06 DIAGNOSIS — N898 Other specified noninflammatory disorders of vagina: Secondary | ICD-10-CM | POA: Diagnosis not present

## 2015-06-07 ENCOUNTER — Encounter (HOSPITAL_COMMUNITY): Payer: Self-pay | Admitting: Emergency Medicine

## 2015-06-07 ENCOUNTER — Emergency Department (HOSPITAL_COMMUNITY)
Admission: EM | Admit: 2015-06-07 | Discharge: 2015-06-07 | Disposition: A | Discharge status: Home / Self Care | Payer: Commercial Managed Care - PPO | Attending: Emergency Medicine | Admitting: Emergency Medicine

## 2015-06-07 DIAGNOSIS — R1030 Lower abdominal pain, unspecified: Secondary | ICD-10-CM

## 2015-06-07 HISTORY — DX: Obesity, unspecified: E66.9

## 2015-06-07 LAB — COMPREHENSIVE METABOLIC PANEL
ALBUMIN: 3.7 g/dL (ref 3.5–5.0)
ALT: 19 U/L (ref 14–54)
AST: 19 U/L (ref 15–41)
Alkaline Phosphatase: 54 U/L (ref 38–126)
Anion gap: 6 (ref 5–15)
BUN: 10 mg/dL (ref 6–20)
CO2: 26 mmol/L (ref 22–32)
Calcium: 9.4 mg/dL (ref 8.9–10.3)
Chloride: 108 mmol/L (ref 101–111)
Creatinine, Ser: 0.87 mg/dL (ref 0.44–1.00)
GFR calc Af Amer: >60 mL/min (ref >60)
GFR calc non Af Amer: >60 mL/min (ref >60)
Glucose, Bld: 154 mg/dL — ABNORMAL HIGH (ref 65–99)
Potassium: 3.4 mmol/L — ABNORMAL LOW (ref 3.5–5.1)
Sodium: 140 mmol/L (ref 135–145)
Total Bilirubin: 0.5 mg/dL (ref 0.3–1.2)
Total Protein: 6.7 g/dL (ref 6.5–8.1)

## 2015-06-07 LAB — CBC WITH DIFFERENTIAL/PLATELET
BASOS ABS: 0 10*3/uL (ref 0.0–0.1)
Basophils Relative: 0 % (ref 0–1)
EOS ABS: 0.4 10*3/uL (ref 0.0–0.7)
EOS PCT: 6 % — AB (ref 0–5)
HCT: 33.7 % — ABNORMAL LOW (ref 36.0–46.0)
HEMOGLOBIN: 10.7 g/dL — AB (ref 12.0–15.0)
Lymphocytes Relative: 37 % (ref 12–46)
Lymphs Abs: 2.3 10*3/uL (ref 0.7–4.0)
MCH: 23.1 pg — ABNORMAL LOW (ref 26.0–34.0)
MCHC: 31.8 g/dL (ref 30.0–36.0)
MCV: 72.6 fL — AB (ref 78.0–100.0)
Monocytes Absolute: 0.5 10*3/uL (ref 0.1–1.0)
Monocytes Relative: 8 % (ref 3–12)
Neutro Abs: 3 10*3/uL (ref 1.7–7.7)
Neutrophils Relative %: 49 % (ref 43–77)
Platelets: 206 10*3/uL (ref 150–400)
RBC: 4.64 MIL/uL (ref 3.87–5.11)
RDW: 15.1 % (ref 11.5–15.5)
WBC: 6.1 10*3/uL (ref 4.0–10.5)

## 2015-06-07 LAB — WET PREP, GENITAL
CLUE CELLS WET PREP: NONE SEEN
Trich, Wet Prep: NONE SEEN
YEAST WET PREP: NONE SEEN

## 2015-06-07 LAB — URINALYSIS, ROUTINE W REFLEX MICROSCOPIC
Bilirubin Urine: NEGATIVE
GLUCOSE, UA: NEGATIVE mg/dL
KETONES UR: NEGATIVE mg/dL
Nitrite: NEGATIVE
PROTEIN: NEGATIVE mg/dL
SPECIFIC GRAVITY, URINE: 1.031 — AB (ref 1.005–1.030)
Urobilinogen, UA: 1 mg/dL (ref 0.0–1.0)
pH: 5 (ref 5.0–8.0)

## 2015-06-07 LAB — URINE MICROSCOPIC-ADD ON

## 2015-06-07 LAB — POC URINE PREG, ED: Preg Test, Ur: NEGATIVE

## 2015-06-07 LAB — LIPASE, BLOOD: Lipase: 27 U/L (ref 22–51)

## 2015-06-07 MED ORDER — OXYCODONE-ACETAMINOPHEN 5-325 MG PO TABS
1.0000 | ORAL_TABLET | Freq: Once | ORAL | Status: AC
  Administered 2015-06-07: 1 via ORAL
  Filled 2015-06-07: qty 1

## 2015-06-07 MED ORDER — OXYCODONE-ACETAMINOPHEN 5-325 MG PO TABS: 1.0000 | ORAL_TABLET | Freq: Four times a day (QID) | ORAL | Status: DC | PRN

## 2015-06-07 MED ORDER — CIPROFLOXACIN HCL 250 MG PO TABS: 250.0000 mg | ORAL_TABLET | Freq: Two times a day (BID) | ORAL | Status: AC

## 2015-06-07 NOTE — Discharge Instructions (Signed)
Abdominal Pain, Women °Abdominal (stomach, pelvic, or belly) pain can be caused by many things. It is important to tell your doctor: °· The location of the pain. °· Does it come and go or is it present all the time? °· Are there things that start the pain (eating certain foods, exercise)? °· Are there other symptoms associated with the pain (fever, nausea, vomiting, diarrhea)? °All of this is helpful to know when trying to find the cause of the pain. °CAUSES  °· Stomach: virus or bacteria infection, or ulcer. °· Intestine: appendicitis (inflamed appendix), regional ileitis (Crohn's disease), ulcerative colitis (inflamed colon), irritable bowel syndrome, diverticulitis (inflamed diverticulum of the colon), or cancer of the stomach or intestine. °· Gallbladder disease or stones in the gallbladder. °· Kidney disease, kidney stones, or infection. °· Pancreas infection or cancer. °· Fibromyalgia (pain disorder). °· Diseases of the female organs: °¨ Uterus: fibroid (non-cancerous) tumors or infection. °¨ Fallopian tubes: infection or tubal pregnancy. °¨ Ovary: cysts or tumors. °¨ Pelvic adhesions (scar tissue). °¨ Endometriosis (uterus lining tissue growing in the pelvis and on the pelvic organs). °¨ Pelvic congestion syndrome (female organs filling up with blood just before the menstrual period). °¨ Pain with the menstrual period. °¨ Pain with ovulation (producing an egg). °¨ Pain with an IUD (intrauterine device, birth control) in the uterus. °¨ Cancer of the female organs. °· Functional pain (pain not caused by a disease, may improve without treatment). °· Psychological pain. °· Depression. °DIAGNOSIS  °Your doctor will decide the seriousness of your pain by doing an examination. °· Blood tests. °· X-rays. °· Ultrasound. °· CT scan (computed tomography, special type of X-ray). °· MRI (magnetic resonance imaging). °· Cultures, for infection. °· Barium enema (dye inserted in the large intestine, to better view it with  X-rays). °· Colonoscopy (looking in intestine with a lighted tube). °· Laparoscopy (minor surgery, looking in abdomen with a lighted tube). °· Major abdominal exploratory surgery (looking in abdomen with a large incision). °TREATMENT  °The treatment will depend on the cause of the pain.  °· Many cases can be observed and treated at home. °· Over-the-counter medicines recommended by your caregiver. °· Prescription medicine. °· Antibiotics, for infection. °· Birth control pills, for painful periods or for ovulation pain. °· Hormone treatment, for endometriosis. °· Nerve blocking injections. °· Physical therapy. °· Antidepressants. °· Counseling with a psychologist or psychiatrist. °· Minor or major surgery. °HOME CARE INSTRUCTIONS  °· Do not take laxatives, unless directed by your caregiver. °· Take over-the-counter pain medicine only if ordered by your caregiver. Do not take aspirin because it can cause an upset stomach or bleeding. °· Try a clear liquid diet (broth or water) as ordered by your caregiver. Slowly move to a bland diet, as tolerated, if the pain is related to the stomach or intestine. °· Have a thermometer and take your temperature several times a day, and record it. °· Bed rest and sleep, if it helps the pain. °· Avoid sexual intercourse, if it causes pain. °· Avoid stressful situations. °· Keep your follow-up appointments and tests, as your caregiver orders. °· If the pain does not go away with medicine or surgery, you may try: °¨ Acupuncture. °¨ Relaxation exercises (yoga, meditation). °¨ Group therapy. °¨ Counseling. °SEEK MEDICAL CARE IF:  °· You notice certain foods cause stomach pain. °· Your home care treatment is not helping your pain. °· You need stronger pain medicine. °· You want your IUD removed. °· You feel faint or   lightheaded. °· You develop nausea and vomiting. °· You develop a rash. °· You are having side effects or an allergy to your medicine. °SEEK IMMEDIATE MEDICAL CARE IF:  °· Your  pain does not go away or gets worse. °· You have a fever. °· Your pain is felt only in portions of the abdomen. The right side could possibly be appendicitis. The left lower portion of the abdomen could be colitis or diverticulitis. °· You are passing blood in your stools (bright red or black tarry stools, with or without vomiting). °· You have blood in your urine. °· You develop chills, with or without a fever. °· You pass out. °MAKE SURE YOU:  °· Understand these instructions. °· Will watch your condition. °· Will get help right away if you are not doing well or get worse. °Document Released: 09/18/2007 Document Revised: 04/07/2014 Document Reviewed: 10/08/2009 °ExitCare® Patient Information ©2015 [employer]. This information is not intended to replace advice given to you by your health care provider. Make sure you discuss any questions you have with your health care provider. ° °

## 2015-06-07 NOTE — ED Notes (Signed)
Harrison, MD at bedside. 

## 2015-06-07 NOTE — ED Notes (Signed)
Pt. reports right lower abdominal pain/pressure with urinary urgency/dysuria onset 2 weeks ago , pt. also reported vaginal skin irritation . Denies fever or chills.

## 2015-06-07 NOTE — ED Notes (Signed)
Pelvic cart at bedside. 

## 2015-06-07 NOTE — ED Provider Notes (Addendum)
CSN: [id number]     Arrival date & time 06/06/15  2358 History  This chart was scribed for Pamella Pert, MD by Hilda Lias, ED Scribe. This patient was seen in room A07C/A07C and the patient's care was started at 2:45 AM.     Chief Complaint  Patient presents with  . Abdominal Pain      Patient is a 44 y.o. female presenting with abdominal pain. The history is provided by the patient. No language interpreter was used.  Abdominal Pain Pain location:  Periumbilical Duration:  2 weeks Timing:  Intermittent Progression:  Worsening Chronicity:  New Associated symptoms: hematuria and vaginal discharge   Associated symptoms: no chest pain, no cough, no diarrhea, no fatigue, no fever and no vomiting      HPI Comments: Grace Gallagher is a 44 y.o. female with a hx of HTN who presents to the Emergency Department complaining of worsening intermittent periumbilical lower abdominal pain that has been present for two weeks with associated discharge and hematuria. Pt states that her abdominal pain feels like a "pressure" sensation and states that the past two days it has gotten worse, and also notes that she has had mild dysuria and hematuria the past two days as well. Pt denies fever, vomiting, diarrhea.    Past Medical History  Diagnosis Date  . Hypertension   . Anemia   . Obesity    Past Surgical History  Procedure Laterality Date  . Foot surgery    . Tubal ligation    . Breast surgery  08/2011    breast reduction   Family History  Problem Relation Age of Onset  . Hypertension Mother   . Heart failure Mother   . Heart attack Mother    History  Substance Use Topics  . Smoking status: Never Smoker   . Smokeless tobacco: Never Used  . Alcohol Use: No   OB History    No data available     Review of Systems  Constitutional: Negative for fever, appetite change and fatigue.  HENT: Negative for congestion, ear discharge and sinus pressure.   Eyes: Negative for discharge.   Respiratory: Negative for cough.   Cardiovascular: Negative for chest pain.  Gastrointestinal: Positive for abdominal pain. Negative for vomiting and diarrhea.  Genitourinary: Positive for hematuria and vaginal discharge. Negative for frequency.  Musculoskeletal: Negative for back pain.  Skin: Negative for rash.  Neurological: Negative for seizures and headaches.  Psychiatric/Behavioral: Negative for hallucinations.  All other systems reviewed and are negative.     Allergies  Hydrocodone  Home Medications   Prior to Admission medications   Medication Sig Start Date End Date Taking? Authorizing Provider  amLODipine (NORVASC) 10 MG tablet Take 1 tablet (10 mg total) by mouth daily. 09/10/14   Lucille Passy, MD  ferrous sulfate 325 (65 FE) MG tablet Take 325 mg by mouth 2 (two) times daily with a meal.    Historical Provider, MD  hydrocortisone (ANUSOL-HC) 25 MG suppository Place 1 suppository (25 mg total) rectally 2 (two) times daily. 10/14/14   Lucille Passy, MD  hydrocortisone cream 1 % Apply to affected area 2 times daily 03/06/15   Okey Regal, PA-C  lisinopril-hydrochlorothiazide (PRINZIDE,ZESTORETIC) 20-25 MG per tablet Take 1 tablet by mouth daily. 10/06/14   Lucille Passy, MD  metoprolol (LOPRESSOR) 50 MG tablet Take 50 mg by mouth 2 (two) times daily as needed (blood pressure.).  04/09/14   Sherryl Barters, NP  Norgestimate-Ethinyl Estradiol Triphasic  0.18/0.215/0.25 MG-35 MCG tablet Take 1 tablet by mouth daily. 05/30/14   Lucille Passy, MD   BP 153/87 mmHg  Pulse 80  Temp(Src) 97.9 F (36.6 C) (Oral)  Resp 16  Ht 5\' 5"  (1.651 m)  Wt 258 lb (117.028 kg)  BMI 42.93 kg/m2  SpO2 99%  LMP 05/21/2015 Physical Exam  Constitutional: She is oriented to person, place, and time. She appears well-developed and well-nourished. No distress.  HENT:  Head: Normocephalic and atraumatic.  Eyes: EOM are normal.  Neck: Normal range of motion.  Cardiovascular: Normal rate, regular rhythm and  normal heart sounds.   Pulmonary/Chest: Effort normal and breath sounds normal.  Abdominal: Soft. She exhibits no distension and no mass. There is no tenderness. There is no rebound and no guarding.  Genitourinary:  Normal-appearing external vagina.  Normal-appearing cervix. Os closed. Moderate amount of light brownish fluid in the posterior fornix.  No cervical motion tenderness. Mild suprapubic tenderness but no lateralizing adnexal tenderness.  Musculoskeletal: Normal range of motion.  Neurological: She is alert and oriented to person, place, and time.  Skin: Skin is warm and dry.  Psychiatric: She has a normal mood and affect. Judgment normal.  Nursing note and vitals reviewed.   ED Course  Procedures (including critical care time)  DIAGNOSTIC STUDIES: Oxygen Saturation is 99% on room air, normal by my interpretation.    COORDINATION OF CARE: 2:50 AM Discussed treatment plan with pt at bedside and pt agreed to plan.   Labs Review Labs Reviewed  CBC WITH DIFFERENTIAL/PLATELET - Abnormal; Notable for the following:    Hemoglobin 10.7 (*)    HCT 33.7 (*)    MCV 72.6 (*)    MCH 23.1 (*)    Eosinophils Relative 6 (*)    All other components within normal limits  COMPREHENSIVE METABOLIC PANEL - Abnormal; Notable for the following:    Potassium 3.4 (*)    Glucose, Bld 154 (*)    All other components within normal limits  URINALYSIS, ROUTINE W REFLEX MICROSCOPIC (NOT AT [facility]) - Abnormal; Notable for the following:    APPearance CLOUDY (*)    Specific Gravity, Urine 1.031 (*)    Hgb urine dipstick MODERATE (*)    Leukocytes, UA TRACE (*)    All other components within normal limits  URINE MICROSCOPIC-ADD ON - Abnormal; Notable for the following:    Squamous Epithelial / LPF MANY (*)    Casts HYALINE CASTS (*)    All other components within normal limits  LIPASE, BLOOD  POC URINE PREG, ED    Imaging Review No results found.   EKG Interpretation None      MDM    Final diagnoses:  Suprapubic pain, unspecified laterality    5:21 AM 44 y.o. female with history of hypertension who presents with intermittent suprapubic pain over the last 2 weeks. She also notes some mild vaginal discharge and urinary urgency. She denies any fevers. Vital signs unremarkable here. She has no abdominal tenderness on exam. Her urinalysis is unimpressive but will cover for UTI given urinary symptoms. We'll also perform pelvic exam.  5:22 AM: I interpreted/reviewed the labs and/or imaging which were non-contributory. Pt continues to appear well. Will give a small Rx for her intermittent pain. I have discussed the diagnosis/risks/treatment options with the patient and believe the pt to be eligible for discharge home to follow-up with her pcp for continued sx. We also discussed returning to the ED immediately if new or worsening sx occur.  We discussed the sx which are most concerning (e.g., fever, vomiting, worsening pain) that necessitate immediate return. Medications administered to the patient during their visit and any new prescriptions provided to the patient are listed below.  Medications given during this visit Medications - No data to display  New Prescriptions   CIPROFLOXACIN (CIPRO) 250 MG TABLET    Take 1 tablet (250 mg total) by mouth 2 (two) times daily.   OXYCODONE-ACETAMINOPHEN (PERCOCET) 5-325 MG PER TABLET    Take 1 tablet by mouth every 6 (six) hours as needed for moderate pain.      I personally performed the services described in this documentation, which was scribed in my presence. The recorded information has been reviewed and is accurate.    Pamella Pert, MD 06/07/15 0093  Pamella Pert, MD 06/07/15 0530

## 2015-06-09 LAB — GC/CHLAMYDIA PROBE AMP (~~LOC~~) NOT AT ARMC
CHLAMYDIA, DNA PROBE: NEGATIVE
Neisseria Gonorrhea: NEGATIVE

## 2015-06-10 LAB — URINE CULTURE: Culture: >=100000

## 2015-06-11 ENCOUNTER — Telehealth (HOSPITAL_BASED_OUTPATIENT_CLINIC_OR_DEPARTMENT_OTHER): Payer: Self-pay | Admitting: Emergency Medicine

## 2015-06-11 NOTE — Telephone Encounter (Signed)
Post ED Visit - Positive Culture Follow-up  Culture report reviewed by antimicrobial stewardship pharmacist: []  Wes Dulaney, Pharm.D., BCPS []  Heide Guile, Pharm.D., BCPS []  Alycia Rossetti, Pharm.D., BCPS []  [place], [place].D., BCPS, AAHIVP []  Legrand Como, Pharm.D., BCPS, AAHIVP []  Isac Sarna, Pharm.D., BCPS [place] PharmD  Positive Urine culture Klebsiella Treated with ciprofloxacin, organism sensitive to the same and no further patient follow-up is required at this time.  Hazle Nordmann 06/11/2015, 8:39 AM

## 2015-06-24 ENCOUNTER — Ambulatory Visit (INDEPENDENT_AMBULATORY_CARE_PROVIDER_SITE_OTHER): Payer: Commercial Managed Care - PPO | Admitting: Physician Assistant

## 2015-06-24 VITALS — BP 152/98 | HR 94 | Temp 98.7°F | Resp 16 | Ht 65.0 in | Wt 256.0 lb

## 2015-06-24 DIAGNOSIS — R300 Dysuria: Secondary | ICD-10-CM

## 2015-06-24 DIAGNOSIS — IMO0001 Reserved for inherently not codable concepts without codable children: Secondary | ICD-10-CM

## 2015-06-24 DIAGNOSIS — R030 Elevated blood-pressure reading, without diagnosis of hypertension: Secondary | ICD-10-CM | POA: Diagnosis not present

## 2015-06-24 LAB — POCT WET PREP WITH KOH
KOH Prep POC: NEGATIVE
RBC Wet Prep HPF POC: NEGATIVE
TRICHOMONAS UA: NEGATIVE
Yeast Wet Prep HPF POC: NEGATIVE

## 2015-06-24 LAB — POCT URINALYSIS DIPSTICK
Bilirubin, UA: NEGATIVE
GLUCOSE UA: NEGATIVE
Ketones, UA: NEGATIVE
Nitrite, UA: NEGATIVE
PH UA: 5.5
Urobilinogen, UA: 0.2

## 2015-06-24 LAB — POCT UA - MICROSCOPIC ONLY
Casts, Ur, LPF, POC: NEGATIVE
Crystals, Ur, HPF, POC: NEGATIVE
Yeast, UA: NEGATIVE

## 2015-06-24 LAB — POCT URINE PREGNANCY: PREG TEST UR: NEGATIVE

## 2015-06-24 MED ORDER — NITROFURANTOIN MONOHYD MACRO 100 MG PO CAPS: 100.0000 mg | ORAL_CAPSULE | Freq: Two times a day (BID) | ORAL | Status: DC

## 2015-06-24 NOTE — Addendum Note (Signed)
Addended by: Tereasa Coop on: 06/24/2015 06:43 PM   Modules accepted: Miquel Dunn

## 2015-06-24 NOTE — Progress Notes (Signed)
06/24/2015 at [place] / DOB: Jan 20, 1971 / MRN: [id number]  The patient has HTN (hypertension); Obesity, unspecified; Microcytic anemia; Headache(784.0); Unspecified contraceptive management; Encounter for routine gynecological examination; Bilateral lower abdominal cramping; Menorrhagia; Back pain; Hypokalemia; Allergic rhinitis; Essential hypertension, malignant; SOB (shortness of breath); Acute sinusitis with symptoms > 10 days; Other malaise and fatigue; Dizziness and giddiness; Vaginitis and vulvovaginitis; Head or neck swelling, mass, or lump; Anal itching; and Edema on her problem list.  SUBJECTIVE  Chief complaint: Urinary Tract Infection  Grace Gallagher is a well appearing 44 y.o. female here today for the continuation of vaginal irritation.  She reports she was seen at the [facility] ED on 06/07/2015 and was diagnosed with a UTI and given Cipro 250 bid for three days of which she completed.  She reports her symptoms improved somewhat but have steadily returned since.  Today she continues to complain of frequency, urgency and "stinging" with urination.  She denies vaginal discharge. She denies abdominal pain, nausea, diaphoresis and back pain today.   She has a history of HTN and is managed with Prinzide and Norvasc.  She has not taken her medication as of yet.    She  has a past medical history of Hypertension; Anemia; and Obesity.    Medications reviewed and updated by myself where necessary, and exist elsewhere in the encounter.   Grace Gallagher is allergic to hydrocodone. She  reports that she has never smoked. She has never used smokeless tobacco. She reports that she does not drink alcohol or use illicit drugs. She  has no sexual activity history on file. The patient  has past surgical history that includes Foot surgery; Tubal ligation; and Breast surgery (08/2011).  Her family history includes Heart attack in her mother; Heart failure in her mother; Hypertension in her  mother.  Review of Systems  Constitutional: Negative for fever and chills.  Respiratory: Negative for cough.   Cardiovascular: Negative for chest pain.  Gastrointestinal: Negative for heartburn.  Genitourinary: Negative for dysuria.  Skin: Negative for itching and rash.  Neurological: Negative for dizziness and headaches.    OBJECTIVE  Her  height is 5\' 5"  (1.651 m) and weight is 256 lb (116.121 kg). Her oral temperature is 98.7 F (37.1 C). Her blood pressure is 152/98 and her pulse is 94. Her respiration is 16 and oxygen saturation is 98%.  The patient's body mass index is 42.6 kg/(m^2).  Physical Exam  Constitutional: She is oriented to person, place, and time. She appears well-developed and well-nourished. No distress.  Cardiovascular: Normal rate.   Respiratory: Effort normal.  GI: Soft. Bowel sounds are normal. There is no tenderness. There is no rebound.  Musculoskeletal: Normal range of motion.  Neurological: She is alert and oriented to person, place, and time. No cranial nerve deficit.  Skin: Skin is warm and dry. She is not diaphoretic.  Psychiatric: She has a normal mood and affect.    Results for orders placed or performed in visit on 06/24/15 (from the past 24 hour(s))  POCT urinalysis dipstick     Status: Abnormal   Collection Time: 06/24/15  6:12 PM  Result Value Ref Range   Color, UA yellow    Clarity, UA clear    Glucose, UA negative    Bilirubin, UA negative    Ketones, UA negative    Spec Grav, UA >=1.030    Blood, UA moderate    pH, UA 5.5    Protein, UA  1+    Urobilinogen, UA 0.2    Nitrite, UA negative    Leukocytes, UA Trace (A) Negative  POCT UA - Microscopic Only     Status: None   Collection Time: 06/24/15  6:12 PM  Result Value Ref Range   WBC, Ur, HPF, POC 1-5    RBC, urine, microscopic 0-1    Bacteria, U Microscopic many    Mucus, UA small    Epithelial cells, urine per micros 10-20    Crystals, Ur, HPF, POC negative    Casts, Ur,  LPF, POC negative    Yeast, UA negative     ASSESSMENT & PLAN  Grace Gallagher was seen today for urinary tract infection.  Diagnoses and all orders for this visit:  Dysuria: Urine very similar to 17 days previous.  Culture from that visit shows sensitivity to Nitrofurantoin.  Will prescribe 5 days and have advised patient that if her symptoms do not ameliorate then to call so we may best formulate a new plan. Orders: -     POCT urinalysis dipstick -     POCT UA - Microscopic Only -     POCT Wet Prep with KOH -     POCT urine pregnancy -     nitrofurantoin, macrocrystal-monohydrate, (MACROBID) 100 MG capsule; Take 1 capsule (100 mg total) by mouth 2 (two) times daily. -     Urine culture  Elevated BP: Advised she take her medication as soon as she gets home.      The patient was advised to call or come back to clinic if she does not see an improvement in symptoms, or worsens with the above plan.   Philis Fendt, MHS, PA-C [facility] 06/24/2015 6:14 PM

## 2015-06-24 NOTE — Patient Instructions (Signed)
Please take your BP medication as soon as possible.

## 2015-06-26 LAB — URINE CULTURE

## 2015-07-07 ENCOUNTER — Ambulatory Visit (INDEPENDENT_AMBULATORY_CARE_PROVIDER_SITE_OTHER): Payer: Commercial Managed Care - PPO

## 2015-07-07 ENCOUNTER — Ambulatory Visit (INDEPENDENT_AMBULATORY_CARE_PROVIDER_SITE_OTHER): Payer: Commercial Managed Care - PPO | Admitting: Family Medicine

## 2015-07-07 VITALS — BP 122/74 | HR 103 | Temp 98.7°F | Resp 17 | Ht 65.5 in | Wt 251.0 lb

## 2015-07-07 DIAGNOSIS — R059 Cough, unspecified: Secondary | ICD-10-CM

## 2015-07-07 DIAGNOSIS — J988 Other specified respiratory disorders: Secondary | ICD-10-CM | POA: Diagnosis not present

## 2015-07-07 DIAGNOSIS — R05 Cough: Secondary | ICD-10-CM

## 2015-07-07 DIAGNOSIS — A499 Bacterial infection, unspecified: Secondary | ICD-10-CM | POA: Diagnosis not present

## 2015-07-07 DIAGNOSIS — N760 Acute vaginitis: Secondary | ICD-10-CM | POA: Diagnosis not present

## 2015-07-07 DIAGNOSIS — R0602 Shortness of breath: Secondary | ICD-10-CM

## 2015-07-07 DIAGNOSIS — B9689 Other specified bacterial agents as the cause of diseases classified elsewhere: Secondary | ICD-10-CM | POA: Insufficient documentation

## 2015-07-07 DIAGNOSIS — I10 Essential (primary) hypertension: Secondary | ICD-10-CM | POA: Diagnosis not present

## 2015-07-07 DIAGNOSIS — J22 Unspecified acute lower respiratory infection: Secondary | ICD-10-CM

## 2015-07-07 MED ORDER — METRONIDAZOLE 500 MG PO TABS: 500.0000 mg | ORAL_TABLET | Freq: Two times a day (BID) | ORAL | Status: DC

## 2015-07-07 MED ORDER — HYDROCHLOROTHIAZIDE 25 MG PO TABS: 25.0000 mg | ORAL_TABLET | Freq: Every day | ORAL | Status: DC

## 2015-07-07 MED ORDER — IPRATROPIUM BROMIDE HFA 17 MCG/ACT IN AERS
2.0000 | INHALATION_SPRAY | Freq: Four times a day (QID) | RESPIRATORY_TRACT | Status: DC | PRN
Start: 2015-07-07 — End: 2018-03-10

## 2015-07-07 MED ORDER — HYDROCODONE-HOMATROPINE 5-1.5 MG/5ML PO SYRP: 5.0000 mL | ORAL_SOLUTION | Freq: Every evening | ORAL | Status: DC | PRN

## 2015-07-07 MED ORDER — LOSARTAN POTASSIUM 50 MG PO TABS: 50.0000 mg | ORAL_TABLET | Freq: Every day | ORAL | Status: DC

## 2015-07-07 NOTE — Patient Instructions (Signed)
Cough, Adult  A cough is a reflex that helps clear your throat and airways. It can help heal the body or may be a reaction to an irritated airway. A cough may only last 2 or 3 weeks (acute) or may last more than 8 weeks (chronic).  CAUSES Acute cough:  Viral or bacterial infections. Chronic cough:  Infections.  Allergies.  Asthma.  Post-nasal drip.  Smoking.  Heartburn or acid reflux.  Some medicines.  Chronic lung problems (COPD).  Cancer. SYMPTOMS   Cough.  Fever.  Chest pain.  Increased breathing rate.  High-pitched whistling sound when breathing (wheezing).  Colored mucus that you cough up (sputum). TREATMENT   A bacterial cough may be treated with antibiotic medicine.  A viral cough must run its course and will not respond to antibiotics.  Your caregiver may recommend other treatments if you have a chronic cough. HOME CARE INSTRUCTIONS   Only take over-the-counter or prescription medicines for pain, discomfort, or fever as directed by your caregiver. Use cough suppressants only as directed by your caregiver.  Use a cold steam vaporizer or humidifier in your bedroom or home to help loosen secretions.  Sleep in a semi-upright position if your cough is worse at night.  Rest as needed.  Stop smoking if you smoke. SEEK IMMEDIATE MEDICAL CARE IF:   You have pus in your sputum.  Your cough starts to worsen.  You cannot control your cough with suppressants and are losing sleep.  You begin coughing up blood.  You have difficulty breathing.  You develop pain which is getting worse or is uncontrolled with medicine.  You have a fever. MAKE SURE YOU:   Understand these instructions.  Will watch your condition.  Will get help right away if you are not doing well or get worse. Document Released: 05/20/2011 Document Revised: 02/13/2012 Document Reviewed: 05/20/2011 [employer] Patient Information 2015 [employer]. This information is not intended  to replace advice given to you by your health care provider. Make sure you discuss any questions you have with your health care provider.  

## 2015-07-07 NOTE — Progress Notes (Signed)
    MRN: [id number] DOB: [birth year]  Subjective:   Grace Gallagher is a 44 y.o. female presenting for chief complaint of URI; Shortness of Breath; and Depression  Reports 1 week persistent dry cough, not worsening, worse at night and in the morning. Has also felt intermittent shob with cough, nasal itchiness, sore throat mostly with cough, nasal congestion. Has not tried medications for relief. Denies . Denies smoking cigarettes. Denies any other aggravating or relieving factors, no other questions or concerns.  Grace Gallagher has a current medication list which includes the following prescription(s): amlodipine, lisinopril-hydrochlorothiazide, metoprolol, nitrofurantoin (macrocrystal-monohydrate), and norgestimate-ethinyl estradiol triphasic. She is allergic to hydrocodone.  Grace Gallagher  has a past medical history of Hypertension; Anemia; and Obesity. Also  has past surgical history that includes Foot surgery; Tubal ligation; and Breast surgery (08/2011).  ROS As in subjective.  Objective:   Vitals: BP 122/74 mmHg  Pulse 103  Temp(Src) 98.7 F (37.1 C) (Oral)  Resp 17  Ht 5' 5.5" (1.664 m)  Wt 251 lb (113.853 kg)  BMI 41.12 kg/m2  SpO2 98%  LMP 06/18/2015  Pulse was 72 on recheck by PA-Ladon Vandenberghe at 15:32.  Physical Exam  Constitutional: She is oriented to person, place, and time. She appears well-developed and well-nourished.  HENT:  TM's intact bilaterally, no effusions or erythema. Nares patent, nasal turbinates pink and moist. No sinus tenderness. Oropharynx clear, mucous membranes moist, dentition in good repair.  Eyes: Conjunctivae are normal. Right eye exhibits no discharge. Left eye exhibits no discharge. No scleral icterus.  Neck: Normal range of motion. Neck supple.  Cardiovascular: Normal rate, regular rhythm and intact distal pulses.   Pulmonary/Chest: No stridor. No respiratory distress. She has wheezes (mild wheezing with cough). She has no rales.  Musculoskeletal: She exhibits  no edema.  Lymphadenopathy:    She has no cervical adenopathy.  Neurological: She is alert and oriented to person, place, and time.  Skin: Skin is warm and dry. No rash noted. No erythema. No pallor.   [facility] reading (PRIMARY) by  Dr. Lorelei Pont and PA-Patricio Popwell. Chest - normal.  Assessment and Plan :   1. Lower respiratory infection 2. Cough 3. Shortness of breath - Likely undergoing viral syndrome, advised supportive care, rtc in 1 week if no improvement in symptoms - DG Chest 2 View; Future - ipratropium (ATROVENT HFA) 17 MCG/ACT inhaler; Inhale 2 puffs into the lungs every 6 (six) hours as needed for wheezing.  Dispense: 1 Inhaler; Refill: 1  4. Essential hypertension - Stable, will switch to losartan to avoid ACEi-induced cough, patient agreed. - losartan (COZAAR) 50 MG tablet; Take 1 tablet (50 mg total) by mouth daily.  Dispense: 30 tablet; Refill: 6 - hydrochlorothiazide (HYDRODIURIL) 25 MG tablet; Take 1 tablet (25 mg total) by mouth daily.  Dispense: 90 tablet; Refill: 3  5. Bacterial vaginosis - Based off of her last results from 2 weeks ago and ongoing symptoms of vaginal discharge and itching, will start patient on tx for BV - RTC in 1 week if no improvement in symptoms, consider pelvic exam - metroNIDAZOLE (FLAGYL) 500 MG tablet; Take 1 tablet (500 mg total) by mouth 2 (two) times daily with a meal. DO NOT CONSUME ALCOHOL WHILE TAKING THIS MEDICATION.  Dispense: 14 tablet; Refill: 0   Jaynee Eagles, PA-C [facility] [phone] 07/07/2015 3:26 PM

## 2015-08-16 ENCOUNTER — Emergency Department (HOSPITAL_COMMUNITY)
Admission: EM | Admit: 2015-08-16 | Discharge: 2015-08-16 | Disposition: A | Discharge status: Home / Self Care | Payer: Commercial Managed Care - PPO | Attending: Emergency Medicine | Admitting: Emergency Medicine

## 2015-08-16 ENCOUNTER — Encounter (HOSPITAL_COMMUNITY): Payer: Self-pay

## 2015-08-16 DIAGNOSIS — Z79899 Other long term (current) drug therapy: Secondary | ICD-10-CM | POA: Insufficient documentation

## 2015-08-16 DIAGNOSIS — I10 Essential (primary) hypertension: Secondary | ICD-10-CM | POA: Insufficient documentation

## 2015-08-16 DIAGNOSIS — J069 Acute upper respiratory infection, unspecified: Secondary | ICD-10-CM | POA: Diagnosis not present

## 2015-08-16 DIAGNOSIS — E669 Obesity, unspecified: Secondary | ICD-10-CM | POA: Insufficient documentation

## 2015-08-16 DIAGNOSIS — Z862 Personal history of diseases of the blood and blood-forming organs and certain disorders involving the immune mechanism: Secondary | ICD-10-CM | POA: Diagnosis not present

## 2015-08-16 DIAGNOSIS — R0981 Nasal congestion: Secondary | ICD-10-CM | POA: Diagnosis present

## 2015-08-16 MED ORDER — FLUTICASONE PROPIONATE 50 MCG/ACT NA SUSP: 2.0000 | Freq: Every day | NASAL | Status: DC

## 2015-08-16 MED ORDER — ALBUTEROL SULFATE HFA 108 (90 BASE) MCG/ACT IN AERS
2.0000 | INHALATION_SPRAY | Freq: Once | RESPIRATORY_TRACT | Status: AC
  Administered 2015-08-16: 2 via RESPIRATORY_TRACT
  Filled 2015-08-16: qty 6.7

## 2015-08-16 MED ORDER — AZITHROMYCIN 250 MG PO TABS
250.0000 mg | ORAL_TABLET | Freq: Every day | ORAL | Status: DC
Start: 2015-08-16 — End: 2016-06-18

## 2015-08-16 MED ORDER — BENZONATATE 100 MG PO CAPS: 100.0000 mg | ORAL_CAPSULE | Freq: Three times a day (TID) | ORAL | Status: DC

## 2015-08-16 MED ORDER — CETIRIZINE-PSEUDOEPHEDRINE ER 5-120 MG PO TB12: 1.0000 | ORAL_TABLET | Freq: Every day | ORAL | Status: DC

## 2015-08-16 NOTE — ED Notes (Signed)
Pt has had cold like symptoms x 1 month. Cough, alternating nasal congestion/runny nose.  Denies fever.

## 2015-08-16 NOTE — ED Provider Notes (Signed)
CSN: [id number]     Arrival date & time 08/16/15  1513 History  This chart was scribed for non-physician practitioner Jeannett Senior, PA-C working with Lacretia Leigh, MD by Meriel Pica, ED Scribe. This patient was seen in room WTR7/WTR7 and the patient's care was started at 3:44 PM.   Chief Complaint  Patient presents with  . Nasal Congestion  . Cough   The history is provided by the patient. No language interpreter was used.   HPI Comments: Grace Gallagher is a 44 y.o. female, with a PMhx of asthma and HTN, who presents to the Emergency Department complaining of gradually worsening, constant URI symptoms that have been present for 4 weeks. She reports a non-productive cough, nasal and chest congestion, and rhinorrhea that she describes to be greenish/yellow. She additionally reports a sore throat onset 2 days ago. Pt has tried OTC cough medication, Zyrtec, nasal spray and Nyquill with no relief but has not taken a decongestant. She has used the nasal spray BID for 1.5 weeks. Pt does not have an inhaler at home. Denies fevers or otalgia. Also denies current smoking.   Past Medical History  Diagnosis Date  . Hypertension   . Anemia   . Obesity    Past Surgical History  Procedure Laterality Date  . Foot surgery    . Tubal ligation    . Breast surgery  08/2011    breast reduction   Family History  Problem Relation Age of Onset  . Hypertension Mother   . Heart failure Mother   . Heart attack Mother    Social History  Substance Use Topics  . Smoking status: Never Smoker   . Smokeless tobacco: Never Used  . Alcohol Use: No   OB History    No data available     Review of Systems  Constitutional: Negative for fever.  HENT: Positive for congestion, rhinorrhea and sore throat. Negative for ear pain.   Respiratory: Positive for cough.    Allergies  Hydrocodone  Home Medications   Prior to Admission medications   Medication Sig Start Date End Date Taking? Authorizing  Provider  amLODipine (NORVASC) 10 MG tablet Take 1 tablet (10 mg total) by mouth daily. 09/10/14   Lucille Passy, MD  hydrochlorothiazide (HYDRODIURIL) 25 MG tablet Take 1 tablet (25 mg total) by mouth daily. 07/07/15   Jaynee Eagles, PA-C  HYDROcodone-homatropine [facility]) 5-1.5 MG/5ML syrup Take 5 mLs by mouth at bedtime as needed. 07/07/15   Jaynee Eagles, PA-C  ipratropium (ATROVENT HFA) 17 MCG/ACT inhaler Inhale 2 puffs into the lungs every 6 (six) hours as needed for wheezing. 07/07/15   Jaynee Eagles, PA-C  losartan (COZAAR) 50 MG tablet Take 1 tablet (50 mg total) by mouth daily. 07/07/15   Jaynee Eagles, PA-C  metoprolol (LOPRESSOR) 50 MG tablet Take 50 mg by mouth 2 (two) times daily as needed (blood pressure.).  04/09/14   Raquel Dagoberto Ligas, NP  metroNIDAZOLE (FLAGYL) 500 MG tablet Take 1 tablet (500 mg total) by mouth 2 (two) times daily with a meal. DO NOT CONSUME ALCOHOL WHILE TAKING THIS MEDICATION. 07/07/15   Jaynee Eagles, PA-C   BP 143/90 mmHg  Pulse 90  Temp(Src) 98.7 F (37.1 C) (Oral)  Resp 18  SpO2 100%  LMP 08/03/2015 Physical Exam  Constitutional: She appears well-developed and well-nourished. No distress.  HENT:  Head: Normocephalic.  Right Ear: External ear normal.  Left Ear: External ear normal.  Mouth/Throat: Oropharynx is clear and moist. No oropharyngeal  exudate.  Mucosal edema; rhinorrhea.   Eyes: Conjunctivae are normal.  Neck: No JVD present.  Cardiovascular: Normal rate, regular rhythm and normal heart sounds.   Pulmonary/Chest: Effort normal and breath sounds normal. No respiratory distress. She has no wheezes.  Neurological: She is alert. Coordination normal.  Skin: Skin is warm. No rash noted. No erythema. No pallor.  Psychiatric: She has a normal mood and affect. Her behavior is normal.  Nursing note and vitals reviewed.   ED Course  Procedures  DIAGNOSTIC STUDIES: Oxygen Saturation is 100% on RA, normal by my interpretation.    COORDINATION OF CARE: 3:50 PM Discussed  treatment plan with pt. Will order albuterol inhaler and prescribe Zyrtec, Flonase, Zithromax, and tessalon. Advised pt to stop use of current nasal spray. Pt acknowledges and agrees to plan.   Labs Review Labs Reviewed - No data to display  Imaging Review No results found. I have personally reviewed and evaluated these images and lab results as part of my medical decision-making.   EKG Interpretation None      MDM   Final diagnoses:  URI (upper respiratory infection)    Patient with flulike symptoms for 1 month, nasal congestion, cough. Patient admits to taking decongestants and unknown brand nasal spray that is supposed to be 12 large decongestants. I have instructed her to stop using this nasal spray, because I'm concerned about rhinitis medicamentosa. Because her symptoms have been going on for so long, will start on Zithromax, Tessalon for cough, Zyrtec-D for congestion, Flonase. Follow-up with her primary care doctor. Vital signs are normal, she is afebrile, nontoxic appearing.  Filed Vitals:   08/16/15 1540  BP: 143/90  Pulse: 90  Temp: 98.7 F (37.1 C)  TempSrc: Oral  Resp: 18  SpO2: 100%     I personally performed the services described in this documentation, which was scribed in my presence. The recorded information has been reviewed and is accurate.   Jeannett Senior, PA-C 08/16/15 2016  Lacretia Leigh, MD 08/16/15 [phone]

## 2015-08-16 NOTE — Discharge Instructions (Signed)
Take medications as prescribed. Use inhaler 2 puffs every 4 hours. Stop using her nasal spray. Rest. Drink clear fluids. Follow with her primary care doctor.   Upper Respiratory Infection, Adult An upper respiratory infection (URI) is also sometimes known as the common cold. The upper respiratory tract includes the nose, sinuses, throat, trachea, and bronchi. Bronchi are the airways leading to the lungs. Most people improve within 1 week, but symptoms can last up to 2 weeks. A residual cough may last even longer.  CAUSES Many different viruses can infect the tissues lining the upper respiratory tract. The tissues become irritated and inflamed and often become very moist. Mucus production is also common. A cold is contagious. You can easily spread the virus to others by oral contact. This includes kissing, sharing a glass, coughing, or sneezing. Touching your mouth or nose and then touching a surface, which is then touched by another person, can also spread the virus. SYMPTOMS  Symptoms typically develop 1 to 3 days after you come in contact with a cold virus. Symptoms vary from person to person. They may include:  Runny nose.  Sneezing.  Nasal congestion.  Sinus irritation.  Sore throat.  Loss of voice (laryngitis).  Cough.  Fatigue.  Muscle aches.  Loss of appetite.  Headache.  Low-grade fever. DIAGNOSIS  You might diagnose your own cold based on familiar symptoms, since most people get a cold 2 to 3 times a year. Your caregiver can confirm this based on your exam. Most importantly, your caregiver can check that your symptoms are not due to another disease such as strep throat, sinusitis, pneumonia, asthma, or epiglottitis. Blood tests, throat tests, and X-rays are not necessary to diagnose a common cold, but they may sometimes be helpful in excluding other more serious diseases. Your caregiver will decide if any further tests are required. RISKS AND COMPLICATIONS  You may be at  risk for a more severe case of the common cold if you smoke cigarettes, have chronic heart disease (such as heart failure) or lung disease (such as asthma), or if you have a weakened immune system. The very young and very old are also at risk for more serious infections. Bacterial sinusitis, middle ear infections, and bacterial pneumonia can complicate the common cold. The common cold can worsen asthma and chronic obstructive pulmonary disease (COPD). Sometimes, these complications can require emergency medical care and may be life-threatening. PREVENTION  The best way to protect against getting a cold is to practice good hygiene. Avoid oral or hand contact with people with cold symptoms. Wash your hands often if contact occurs. There is no clear evidence that vitamin C, vitamin E, echinacea, or exercise reduces the chance of developing a cold. However, it is always recommended to get plenty of rest and practice good nutrition. TREATMENT  Treatment is directed at relieving symptoms. There is no cure. Antibiotics are not effective, because the infection is caused by a virus, not by bacteria. Treatment may include:  Increased fluid intake. Sports drinks offer valuable electrolytes, sugars, and fluids.  Breathing heated mist or steam (vaporizer or shower).  Eating chicken soup or other clear broths, and maintaining good nutrition.  Getting plenty of rest.  Using gargles or lozenges for comfort.  Controlling fevers with ibuprofen or acetaminophen as directed by your caregiver.  Increasing usage of your inhaler if you have asthma. Zinc gel and zinc lozenges, taken in the first 24 hours of the common cold, can shorten the duration and lessen the severity  of symptoms. Pain medicines may help with fever, muscle aches, and throat pain. A variety of non-prescription medicines are available to treat congestion and runny nose. Your caregiver can make recommendations and may suggest nasal or lung inhalers for  other symptoms.  HOME CARE INSTRUCTIONS   Only take over-the-counter or prescription medicines for pain, discomfort, or fever as directed by your caregiver.  Use a warm mist humidifier or inhale steam from a shower to increase air moisture. This may keep secretions moist and make it easier to breathe.  Drink enough water and fluids to keep your urine clear or pale yellow.  Rest as needed.  Return to work when your temperature has returned to normal or as your caregiver advises. You may need to stay home longer to avoid infecting others. You can also use a face mask and careful hand washing to prevent spread of the virus. SEEK MEDICAL CARE IF:   After the first few days, you feel you are getting worse rather than better.  You need your caregiver's advice about medicines to control symptoms.  You develop chills, worsening shortness of breath, or brown or red sputum. These may be signs of pneumonia.  You develop yellow or brown nasal discharge or pain in the face, especially when you bend forward. These may be signs of sinusitis.  You develop a fever, swollen neck glands, pain with swallowing, or white areas in the back of your throat. These may be signs of strep throat. SEEK IMMEDIATE MEDICAL CARE IF:   You have a fever.  You develop severe or persistent headache, ear pain, sinus pain, or chest pain.  You develop wheezing, a prolonged cough, cough up blood, or have a change in your usual mucus (if you have chronic lung disease).  You develop sore muscles or a stiff neck. Document Released: 05/17/2001 Document Revised: 02/13/2012 Document Reviewed: 02/26/2014 [facility] Patient Information 2015 [place], [place]. This information is not intended to replace advice given to you by your health care provider. Make sure you discuss any questions you have with your health care provider.

## 2015-09-30 ENCOUNTER — Other Ambulatory Visit: Payer: Self-pay | Admitting: Obstetrics & Gynecology

## 2015-09-30 DIAGNOSIS — R928 Other abnormal and inconclusive findings on diagnostic imaging of breast: Secondary | ICD-10-CM

## 2015-10-15 ENCOUNTER — Ambulatory Visit
Admission: RE | Admit: 2015-10-15 | Discharge: 2015-10-15 | Disposition: A | Discharge status: Home / Self Care | Payer: Commercial Managed Care - PPO | Source: Ambulatory Visit | Attending: Obstetrics & Gynecology | Admitting: Obstetrics & Gynecology

## 2015-10-15 DIAGNOSIS — R928 Other abnormal and inconclusive findings on diagnostic imaging of breast: Secondary | ICD-10-CM

## 2015-11-27 ENCOUNTER — Encounter: Payer: Self-pay | Admitting: Obstetrics & Gynecology

## 2015-11-29 IMAGING — CR DG CHEST 2V
2 series · 2 of 2 positions shown · non-contrast
Comparison: 03/27/2015.

CLINICAL DATA: Shortness of breath.

EXAM:
CHEST  2 VIEW

[PA]
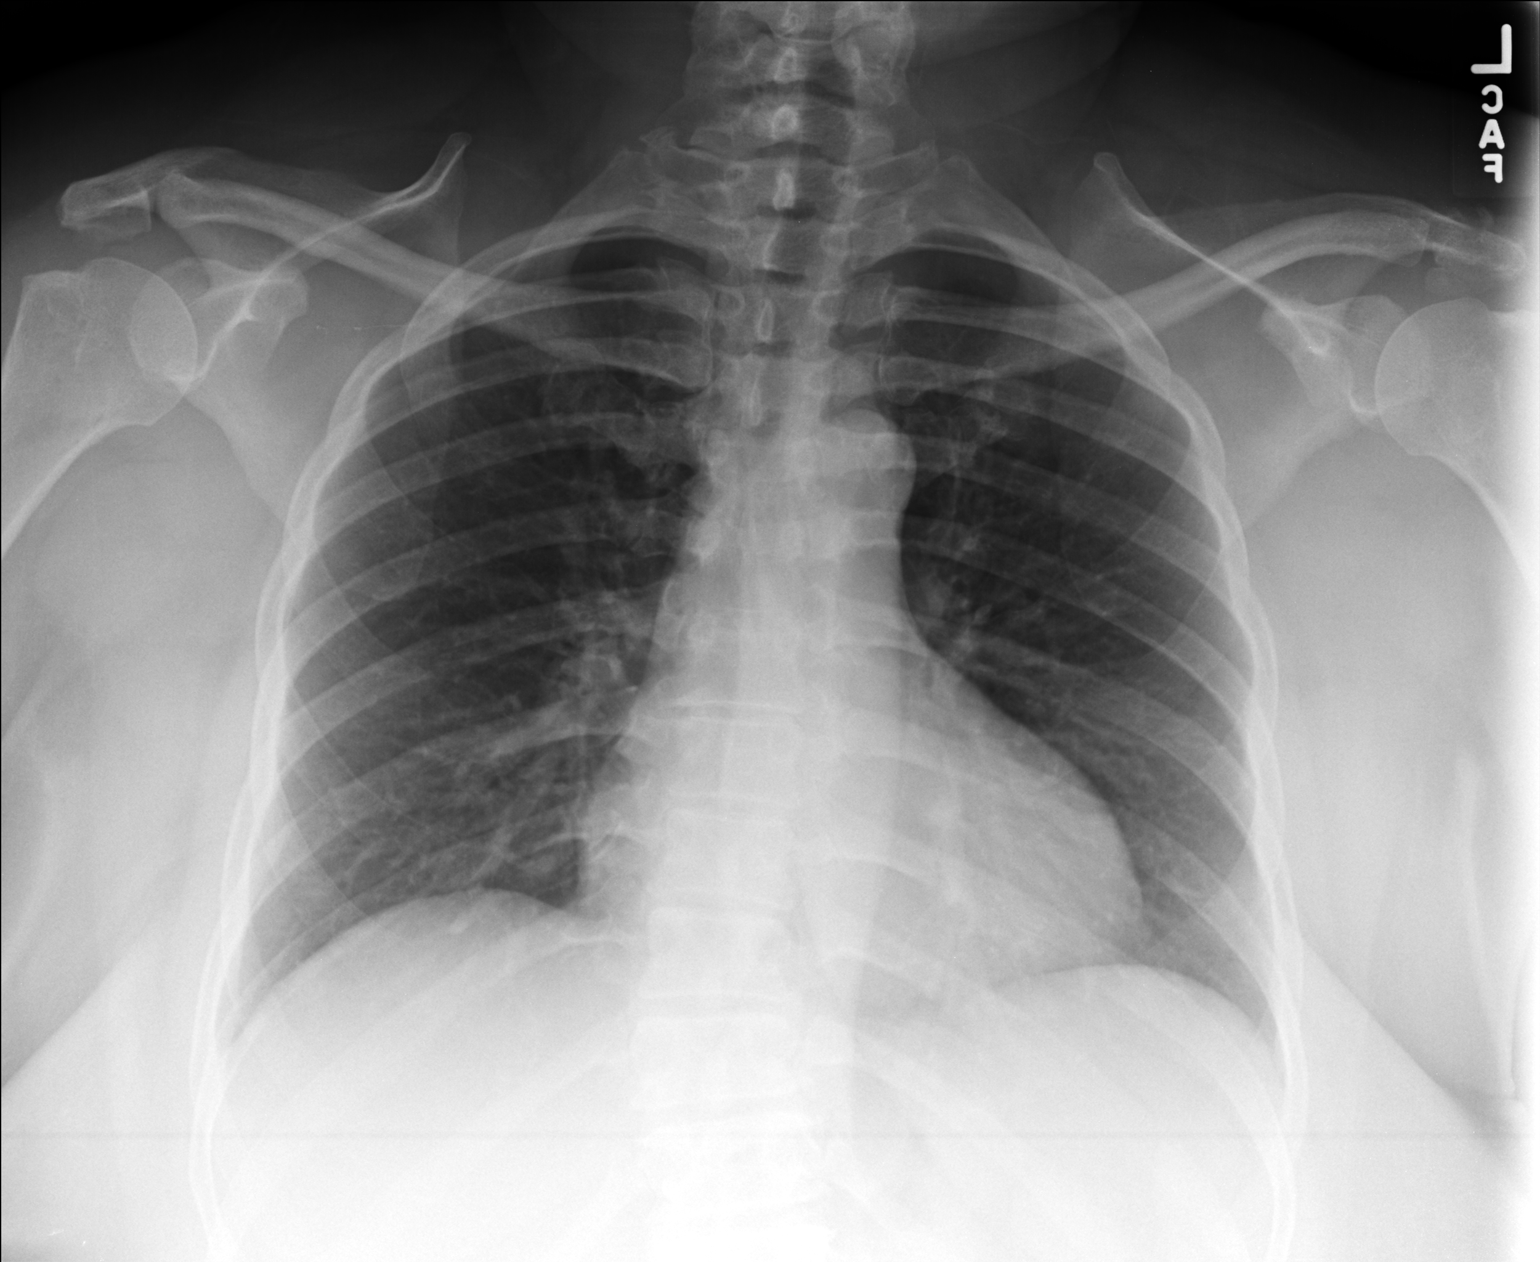

[lateral]
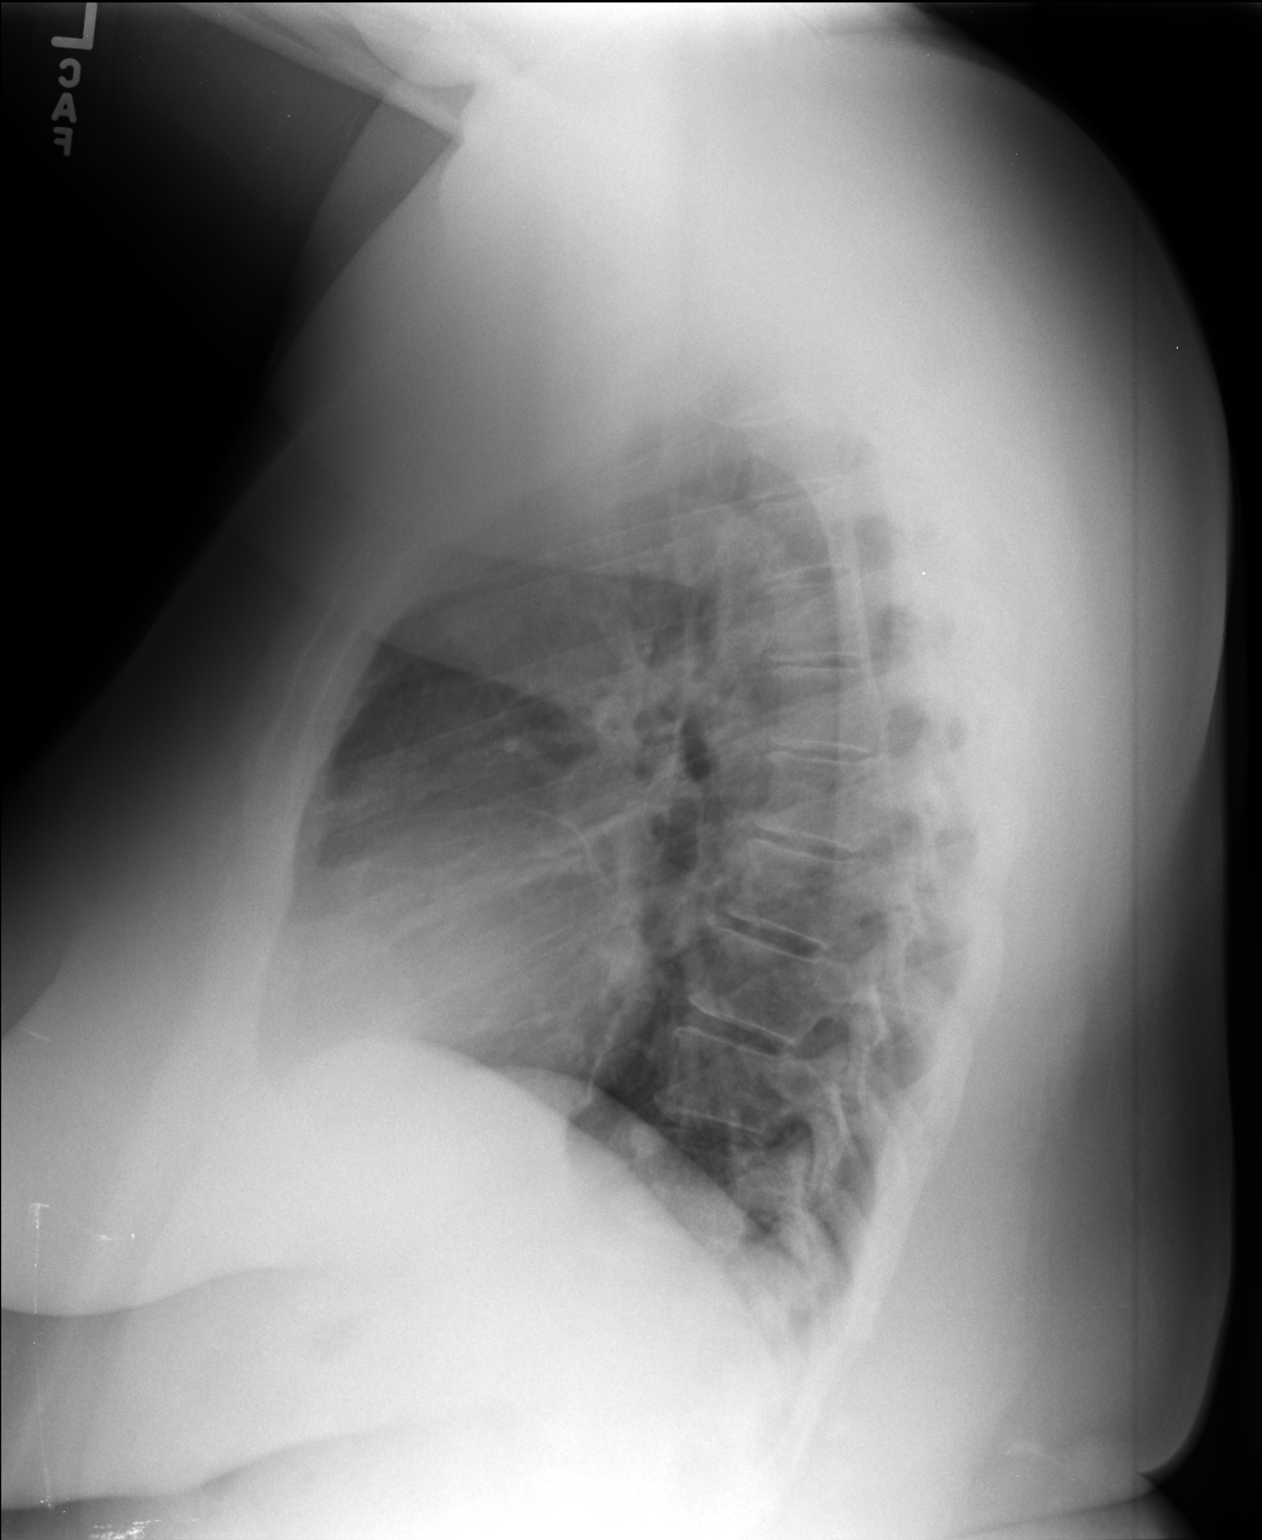

[2 of 2 positions shown; findings below may reference images not displayed]

FINDINGS: The heart size and mediastinal contours are within normal limits.
Both lungs are clear. The visualized skeletal structures are
unremarkable.
IMPRESSION: No acute cardiopulmonary findings.

## 2016-06-16 ENCOUNTER — Encounter (HOSPITAL_COMMUNITY): Payer: Self-pay | Admitting: Emergency Medicine

## 2016-06-16 ENCOUNTER — Emergency Department (HOSPITAL_COMMUNITY)
Admission: EM | Admit: 2016-06-16 | Discharge: 2016-06-16 | Disposition: A | Discharge status: Home / Self Care | Payer: Commercial Managed Care - PPO | Attending: Emergency Medicine | Admitting: Emergency Medicine

## 2016-06-16 DIAGNOSIS — E1165 Type 2 diabetes mellitus with hyperglycemia: Secondary | ICD-10-CM | POA: Insufficient documentation

## 2016-06-16 DIAGNOSIS — Z5321 Procedure and treatment not carried out due to patient leaving prior to being seen by health care provider: Secondary | ICD-10-CM | POA: Insufficient documentation

## 2016-06-16 DIAGNOSIS — I10 Essential (primary) hypertension: Secondary | ICD-10-CM | POA: Insufficient documentation

## 2016-06-16 HISTORY — DX: Type 2 diabetes mellitus without complications: E11.9

## 2016-06-16 LAB — BASIC METABOLIC PANEL
Anion gap: 11 (ref 5–15)
BUN: 16 mg/dL (ref 6–20)
CO2: 25 mmol/L (ref 22–32)
CREATININE: 1.02 mg/dL — AB (ref 0.44–1.00)
Calcium: 9.8 mg/dL (ref 8.9–10.3)
Chloride: 97 mmol/L — ABNORMAL LOW (ref 101–111)
GFR calc non Af Amer: >60 mL/min (ref >60)
Glucose, Bld: 318 mg/dL — ABNORMAL HIGH (ref 65–99)
POTASSIUM: 3.6 mmol/L (ref 3.5–5.1)
SODIUM: 133 mmol/L — AB (ref 135–145)

## 2016-06-16 LAB — URINE MICROSCOPIC-ADD ON: RBC / HPF: NONE SEEN RBC/hpf (ref 0–5)

## 2016-06-16 LAB — URINALYSIS, ROUTINE W REFLEX MICROSCOPIC
BILIRUBIN URINE: NEGATIVE
Glucose, UA: >1000 mg/dL — AB
Hgb urine dipstick: NEGATIVE
KETONES UR: NEGATIVE mg/dL
Leukocytes, UA: NEGATIVE
NITRITE: NEGATIVE
PH: 5.5 (ref 5.0–8.0)
PROTEIN: NEGATIVE mg/dL
Specific Gravity, Urine: 1.022 (ref 1.005–1.030)

## 2016-06-16 LAB — CBC
HCT: 37.7 % (ref 36.0–46.0)
Hemoglobin: 11.8 g/dL — ABNORMAL LOW (ref 12.0–15.0)
MCH: 21.8 pg — ABNORMAL LOW (ref 26.0–34.0)
MCHC: 31.3 g/dL (ref 30.0–36.0)
MCV: 69.7 fL — AB (ref 78.0–100.0)
Platelets: 204 10*3/uL (ref 150–400)
RBC: 5.41 MIL/uL — ABNORMAL HIGH (ref 3.87–5.11)
RDW: 14.4 % (ref 11.5–15.5)
WBC: 6.9 10*3/uL (ref 4.0–10.5)

## 2016-06-16 LAB — CBG MONITORING, ED: Glucose-Capillary: 318 mg/dL — ABNORMAL HIGH (ref 65–99)

## 2016-06-16 NOTE — ED Notes (Signed)
Pt called to room again with no answer.

## 2016-06-16 NOTE — ED Notes (Signed)
Pt unable to be found in triage.

## 2016-06-16 NOTE — ED Notes (Signed)
Pt reports polydipsia, polyuria, fatigue starting Monday. Pt reports a nurse checked her sugar at work and it was 492. Pt was then seen at her PCP and diagnosed with diabetes. Pt here today with continuing fatigue. Pt sts she was given a prescription for metformin and has been taking appropriately.

## 2016-06-18 ENCOUNTER — Emergency Department (HOSPITAL_COMMUNITY)
Admission: EM | Admit: 2016-06-18 | Discharge: 2016-06-18 | Disposition: A | Discharge status: Home / Self Care | Payer: Commercial Managed Care - PPO | Attending: Emergency Medicine | Admitting: Emergency Medicine

## 2016-06-18 ENCOUNTER — Encounter (HOSPITAL_COMMUNITY): Payer: Self-pay | Admitting: Emergency Medicine

## 2016-06-18 DIAGNOSIS — I10 Essential (primary) hypertension: Secondary | ICD-10-CM | POA: Insufficient documentation

## 2016-06-18 DIAGNOSIS — Z7951 Long term (current) use of inhaled steroids: Secondary | ICD-10-CM | POA: Diagnosis not present

## 2016-06-18 DIAGNOSIS — E1165 Type 2 diabetes mellitus with hyperglycemia: Secondary | ICD-10-CM | POA: Diagnosis not present

## 2016-06-18 DIAGNOSIS — Z7984 Long term (current) use of oral hypoglycemic drugs: Secondary | ICD-10-CM | POA: Diagnosis not present

## 2016-06-18 DIAGNOSIS — R739 Hyperglycemia, unspecified: Secondary | ICD-10-CM

## 2016-06-18 LAB — CBC
HEMATOCRIT: 36.7 % (ref 36.0–46.0)
HEMOGLOBIN: 11.8 g/dL — AB (ref 12.0–15.0)
MCH: 22.1 pg — AB (ref 26.0–34.0)
MCHC: 32.2 g/dL (ref 30.0–36.0)
MCV: 68.6 fL — ABNORMAL LOW (ref 78.0–100.0)
Platelets: 210 10*3/uL (ref 150–400)
RBC: 5.35 MIL/uL — ABNORMAL HIGH (ref 3.87–5.11)
RDW: 14.7 % (ref 11.5–15.5)
WBC: 5.8 10*3/uL (ref 4.0–10.5)

## 2016-06-18 LAB — BASIC METABOLIC PANEL
ANION GAP: 12 (ref 5–15)
BUN: 18 mg/dL (ref 6–20)
CALCIUM: 9.8 mg/dL (ref 8.9–10.3)
CO2: 24 mmol/L (ref 22–32)
Chloride: 98 mmol/L — ABNORMAL LOW (ref 101–111)
Creatinine, Ser: 1.14 mg/dL — ABNORMAL HIGH (ref 0.44–1.00)
GFR calc Af Amer: >60 mL/min (ref >60)
GFR calc non Af Amer: 58 mL/min — ABNORMAL LOW (ref >60)
GLUCOSE: 485 mg/dL — AB (ref 65–99)
POTASSIUM: 3.5 mmol/L (ref 3.5–5.1)
Sodium: 134 mmol/L — ABNORMAL LOW (ref 135–145)

## 2016-06-18 LAB — CBG MONITORING, ED
Glucose-Capillary: 469 mg/dL — ABNORMAL HIGH (ref 65–99)
Glucose-Capillary: 268 mg/dL — ABNORMAL HIGH (ref 65–99)
Glucose-Capillary: 201 mg/dL — ABNORMAL HIGH (ref 65–99)

## 2016-06-18 LAB — URINE MICROSCOPIC-ADD ON
RBC / HPF: NONE SEEN RBC/hpf (ref 0–5)
WBC, UA: NONE SEEN WBC/hpf (ref 0–5)

## 2016-06-18 LAB — URINALYSIS, ROUTINE W REFLEX MICROSCOPIC
BILIRUBIN URINE: NEGATIVE
Glucose, UA: >1000 mg/dL — AB
HGB URINE DIPSTICK: NEGATIVE
Ketones, ur: NEGATIVE mg/dL
Leukocytes, UA: NEGATIVE
Nitrite: NEGATIVE
PH: 5 (ref 5.0–8.0)
Protein, ur: NEGATIVE mg/dL
Specific Gravity, Urine: 1.018 (ref 1.005–1.030)

## 2016-06-18 MED ORDER — INSULIN ASPART 100 UNIT/ML ~~LOC~~ SOLN
5.0000 [IU] | Freq: Once | SUBCUTANEOUS | Status: AC
  Administered 2016-06-18: 5 [IU] via SUBCUTANEOUS
  Filled 2016-06-18: qty 1

## 2016-06-18 MED ORDER — INSULIN ASPART 100 UNIT/ML ~~LOC~~ SOLN
12.0000 [IU] | Freq: Once | SUBCUTANEOUS | Status: AC
  Administered 2016-06-18: 12 [IU] via SUBCUTANEOUS
  Filled 2016-06-18: qty 1

## 2016-06-18 MED ORDER — SODIUM CHLORIDE 0.9 % IV BOLUS (SEPSIS)
1000.0000 mL | Freq: Once | INTRAVENOUS | Status: AC
  Administered 2016-06-18: 1000 mL via INTRAVENOUS

## 2016-06-18 NOTE — ED Provider Notes (Signed)
CSN: [id number]     Arrival date & time 06/18/16  1107 History   First MD Initiated Contact with Patient 06/18/16 1142     Chief Complaint  Patient presents with  . Hyperglycemia    HPI   45 year old female presents today with hyperglycemia. Patient reports she has a new diagnosis of hyperglycemia, had her blood sugar checked at her workplace and noted to be hyperglycemic in the 500s. Patient reports she feels tired, but otherwise feels well. She denies any fever or chills, nausea or vomiting, diarrhea. Patient reports that she was seen by her primary care provider last week and started on metformin. Patient is not currently on insulin. She denies any abdominal pain.  Past Medical History  Diagnosis Date  . Hypertension   . Anemia   . Obesity   . Diabetes mellitus without complication [facility])    Past Surgical History  Procedure Laterality Date  . Foot surgery    . Tubal ligation    . Breast surgery  08/2011    breast reduction   Family History  Problem Relation Age of Onset  . Hypertension Mother   . Heart failure Mother   . Heart attack Mother    Social History  Substance Use Topics  . Smoking status: Never Smoker   . Smokeless tobacco: Never Used  . Alcohol Use: No   OB History    No data available     Review of Systems  All other systems reviewed and are negative.   Allergies  Hydrocodone  Home Medications   Prior to Admission medications   Medication Sig Start Date End Date Taking? Authorizing Provider  amLODipine (NORVASC) 10 MG tablet Take 1 tablet (10 mg total) by mouth daily. 09/10/14  Yes Lucille Passy, MD  fluticasone (FLONASE) 50 MCG/ACT nasal spray Place 2 sprays into both nostrils daily. 08/16/15  Yes Tatyana Kirichenko, PA-C  furosemide (LASIX) 40 MG tablet Take 40 mg by mouth daily. 06/01/16  Yes Historical Provider, MD  hydrochlorothiazide (HYDRODIURIL) 25 MG tablet Take 1 tablet (25 mg total) by mouth daily. 07/07/15  Yes Jaynee Eagles, PA-C  ipratropium  (ATROVENT HFA) 17 MCG/ACT inhaler Inhale 2 puffs into the lungs every 6 (six) hours as needed for wheezing. 07/07/15  Yes Mario Mani, PA-C  KLOR-CON M20 20 MEQ tablet Take 20 mEq by mouth daily. 06/01/16  Yes Historical Provider, MD  losartan (COZAAR) 100 MG tablet Take 100 mg by mouth daily. 06/01/16  Yes Historical Provider, MD  metFORMIN (GLUCOPHAGE) 1000 MG tablet TAKE 1/2 TAB 2X WITH MEALS FOR 1-2 WEEKS, THEN INCREASE TO 1 TAB X2 DAILY WITH MEALS BY MOUTH 06/13/16  Yes Historical Provider, MD  metoprolol succinate (TOPROL-XL) 50 MG 24 hr tablet Take 50 mg by mouth daily.  06/01/16  Yes Historical Provider, MD  VENTOLIN HFA 108 (90 Base) MCG/ACT inhaler INHALE 1 TO 2 PUFFS BY MOUTH EVERY 4 HOURS AS NEEDED FOR WHEEZING 06/01/16  Yes Historical Provider, MD  zolpidem (AMBIEN) 5 MG tablet Take 5 mg by mouth at bedtime. 05/09/16  Yes Historical Provider, MD  fluconazole (DIFLUCAN) 150 MG tablet Take 150 mg by mouth daily. Reported on 06/18/2016 06/13/16   Historical Provider, MD   BP 123/90 mmHg  Pulse 78  Temp(Src) 98.6 F (37 C) (Oral)  Resp 18  SpO2 97%  LMP 06/01/2016 Physical Exam  Constitutional: She is oriented to person, place, and time. She appears well-developed and well-nourished.  HENT:  Head: Normocephalic and atraumatic.  Eyes:  Conjunctivae are normal. Pupils are equal, round, and reactive to light. Right eye exhibits no discharge. Left eye exhibits no discharge. No scleral icterus.  Neck: Normal range of motion. No JVD present. No tracheal deviation present.  Cardiovascular: Normal rate, regular rhythm, normal heart sounds and intact distal pulses.  Exam reveals no gallop and no friction rub.   No murmur heard. Pulmonary/Chest: Effort normal and breath sounds normal. No stridor.  Musculoskeletal: She exhibits no edema.  Neurological: She is alert and oriented to person, place, and time. Coordination normal.  Skin: Skin is warm and dry. No rash noted. No erythema. No pallor.   Psychiatric: She has a normal mood and affect. Her behavior is normal. Judgment and thought content normal.  Nursing note and vitals reviewed.   ED Course  Procedures (including critical care time) Labs Review Labs Reviewed  BASIC METABOLIC PANEL - Abnormal; Notable for the following:    Sodium 134 (*)    Chloride 98 (*)    Glucose, Bld 485 (*)    Creatinine, Ser 1.14 (*)    GFR calc non Af Amer 58 (*)    All other components within normal limits  CBC - Abnormal; Notable for the following:    RBC 5.35 (*)    Hemoglobin 11.8 (*)    MCV 68.6 (*)    MCH 22.1 (*)    All other components within normal limits  URINALYSIS, ROUTINE W REFLEX MICROSCOPIC (NOT AT [facility]) - Abnormal; Notable for the following:    Glucose, UA >1000 (*)    All other components within normal limits  URINE MICROSCOPIC-ADD ON - Abnormal; Notable for the following:    Squamous Epithelial / LPF 0-5 (*)    Bacteria, UA FEW (*)    All other components within normal limits  CBG MONITORING, ED - Abnormal; Notable for the following:    Glucose-Capillary 469 (*)    All other components within normal limits  CBG MONITORING, ED - Abnormal; Notable for the following:    Glucose-Capillary 268 (*)    All other components within normal limits  CBG MONITORING, ED - Abnormal; Notable for the following:    Glucose-Capillary 201 (*)    All other components within normal limits    Imaging Review No results found. I have personally reviewed and evaluated these images and lab results as part of my medical decision-making.   EKG Interpretation None      MDM   Final diagnoses:  Hyperglycemia    Labs:CBG, urinalysis, BMP and CBC  Imaging:  Consults:  Therapeutics: Regular insulin, normal saline  Discharge Meds:   Assessment/Plan: 45 year old female presents today with hyperglycemia. Patient reports somewhat fatigued, denies any infectious etiology. Patient is a newly diagnosed type II diabetic currently using  metformin. Patient has followed up with primary care for management of this. Initially her CBG was 469, she was given a liter of normal saline and insulin with reduction in her glucose to 268, another small bolus of insulin was given which reduced her CBG 201. Patient reports she's feeling better, she has no signs of DKA or hyperosmolar state. Patient was instructed continue using metformin, extensive counseling on weight management, diet, exercise. She is instructed to contact her primary care provider first thing Monday morning in follow-up as is possible for continued management. She is instructed to continue monitoring her blood sugar levels and return to the emergency room if any new or worsening signs or symptoms presented. She verbalized her understanding and agreement to  today's plan         Okey Regal, PA-C 06/19/16 0919  Tanna Furry, MD 06/27/16 [phone]

## 2016-06-18 NOTE — ED Notes (Signed)
Pt states she was diagnosed with diabetes on Monday and states her blood sugar has remained high ever since. This am CBG 565. Current CBG of 469. Pt states she was put on Metformin twice a day. States she feels tired and has polyuria.

## 2016-06-18 NOTE — ED Notes (Signed)
PT DISCHARGED. INSTRUCTIONS GIVEN. AAOX4. PT IN NO APPARENT DISTRESS OR PAIN. THE OPPORTUNITY TO ASK QUESTIONS WAS PROVIDED. 

## 2016-06-18 NOTE — Discharge Instructions (Signed)
Please follow-up with your Primary care provider for ongoing management. Please return immediately if you experience any new or worsening signs or symptoms.

## 2016-07-18 ENCOUNTER — Encounter: Payer: Commercial Managed Care - PPO | Admitting: Skilled Nursing Facility1

## 2016-08-09 ENCOUNTER — Ambulatory Visit: Payer: Commercial Managed Care - PPO | Admitting: Skilled Nursing Facility1

## 2016-10-22 ENCOUNTER — Emergency Department (HOSPITAL_COMMUNITY): Payer: Commercial Managed Care - PPO

## 2016-10-22 ENCOUNTER — Emergency Department (HOSPITAL_COMMUNITY)
Admission: EM | Admit: 2016-10-22 | Discharge: 2016-10-22 | Disposition: A | Discharge status: Home / Self Care | Payer: Commercial Managed Care - PPO | Attending: Emergency Medicine | Admitting: Emergency Medicine

## 2016-10-22 ENCOUNTER — Encounter (HOSPITAL_COMMUNITY): Payer: Self-pay | Admitting: Emergency Medicine

## 2016-10-22 ENCOUNTER — Emergency Department (HOSPITAL_BASED_OUTPATIENT_CLINIC_OR_DEPARTMENT_OTHER)
Admit: 2016-10-22 | Discharge: 2016-10-22 | Disposition: A | Discharge status: Home / Self Care | Payer: Commercial Managed Care - PPO | Attending: Emergency Medicine | Admitting: Emergency Medicine

## 2016-10-22 DIAGNOSIS — R600 Localized edema: Secondary | ICD-10-CM | POA: Diagnosis not present

## 2016-10-22 DIAGNOSIS — Z791 Long term (current) use of non-steroidal anti-inflammatories (NSAID): Secondary | ICD-10-CM | POA: Diagnosis not present

## 2016-10-22 DIAGNOSIS — M79605 Pain in left leg: Secondary | ICD-10-CM

## 2016-10-22 DIAGNOSIS — Z79899 Other long term (current) drug therapy: Secondary | ICD-10-CM | POA: Diagnosis not present

## 2016-10-22 DIAGNOSIS — Z7984 Long term (current) use of oral hypoglycemic drugs: Secondary | ICD-10-CM | POA: Insufficient documentation

## 2016-10-22 DIAGNOSIS — M7989 Other specified soft tissue disorders: Secondary | ICD-10-CM | POA: Diagnosis present

## 2016-10-22 DIAGNOSIS — I10 Essential (primary) hypertension: Secondary | ICD-10-CM | POA: Insufficient documentation

## 2016-10-22 DIAGNOSIS — E119 Type 2 diabetes mellitus without complications: Secondary | ICD-10-CM | POA: Diagnosis not present

## 2016-10-22 LAB — APTT: APTT: 32 s (ref 24–36)

## 2016-10-22 LAB — CBC WITH DIFFERENTIAL/PLATELET
BASOS ABS: 0 10*3/uL (ref 0.0–0.1)
Basophils Relative: 0 %
EOS ABS: 0.3 10*3/uL (ref 0.0–0.7)
EOS PCT: 5 %
HCT: 32 % — ABNORMAL LOW (ref 36.0–46.0)
Hemoglobin: 10 g/dL — ABNORMAL LOW (ref 12.0–15.0)
Lymphocytes Relative: 38 %
Lymphs Abs: 1.9 10*3/uL (ref 0.7–4.0)
MCH: 22.1 pg — ABNORMAL LOW (ref 26.0–34.0)
MCHC: 31.3 g/dL (ref 30.0–36.0)
MCV: 70.8 fL — ABNORMAL LOW (ref 78.0–100.0)
MONO ABS: 0.4 10*3/uL (ref 0.1–1.0)
Monocytes Relative: 7 %
NEUTROS ABS: 2.5 10*3/uL (ref 1.7–7.7)
Neutrophils Relative %: 50 %
PLATELETS: 195 10*3/uL (ref 150–400)
RBC: 4.52 MIL/uL (ref 3.87–5.11)
RDW: 15.6 % — AB (ref 11.5–15.5)
WBC: 5 10*3/uL (ref 4.0–10.5)

## 2016-10-22 LAB — COMPREHENSIVE METABOLIC PANEL
ALBUMIN: 3.6 g/dL (ref 3.5–5.0)
ALT: 23 U/L (ref 14–54)
ANION GAP: 7 (ref 5–15)
AST: 20 U/L (ref 15–41)
Alkaline Phosphatase: 50 U/L (ref 38–126)
BUN: 12 mg/dL (ref 6–20)
CHLORIDE: 106 mmol/L (ref 101–111)
CO2: 26 mmol/L (ref 22–32)
Calcium: 8.8 mg/dL — ABNORMAL LOW (ref 8.9–10.3)
Creatinine, Ser: 0.84 mg/dL (ref 0.44–1.00)
GFR calc Af Amer: >60 mL/min (ref >60)
GFR calc non Af Amer: >60 mL/min (ref >60)
GLUCOSE: 150 mg/dL — AB (ref 65–99)
POTASSIUM: 3.4 mmol/L — AB (ref 3.5–5.1)
SODIUM: 139 mmol/L (ref 135–145)
Total Bilirubin: 0.9 mg/dL (ref 0.3–1.2)
Total Protein: 6.7 g/dL (ref 6.5–8.1)

## 2016-10-22 LAB — I-STAT BETA HCG BLOOD, ED (MC, WL, AP ONLY)

## 2016-10-22 LAB — PROTIME-INR
INR: 1.07
Prothrombin Time: 13.9 seconds (ref 11.4–15.2)

## 2016-10-22 MED ORDER — MORPHINE SULFATE (PF) 4 MG/ML IV SOLN
4.0000 mg | Freq: Once | INTRAVENOUS | Status: DC
  Filled 2016-10-22: qty 1

## 2016-10-22 MED ORDER — TRAMADOL HCL 50 MG PO TABS: 50.0000 mg | ORAL_TABLET | Freq: Four times a day (QID) | ORAL | 0 refills | Status: DC | PRN

## 2016-10-22 MED ORDER — ONDANSETRON HCL 4 MG/2ML IJ SOLN
4.0000 mg | Freq: Once | INTRAMUSCULAR | Status: DC
  Filled 2016-10-22: qty 2

## 2016-10-22 MED ORDER — ONDANSETRON HCL 4 MG PO TABS: 4.0000 mg | ORAL_TABLET | Freq: Three times a day (TID) | ORAL | 0 refills | Status: DC | PRN

## 2016-10-22 NOTE — Progress Notes (Signed)
VASCULAR LAB PRELIMINARY  PRELIMINARY  PRELIMINARY  PRELIMINARY  Left lower extremity venous duplex completed.    Preliminary report:  There is no DVT or SVT noted in the left lower extremity.  There is localized edema noted at the ankle.   Cassidy Tabet, RVT 10/22/2016, 1:14 PM

## 2016-10-22 NOTE — ED Triage Notes (Signed)
Pt reports increased l/foot pain over last 4 days. Pain unresponsive to OTC meds. Denies trauma.Swelling, tenderness and edema noted at l/inner ankle. No swelling r/ankle. Pt is alert, oriented and ambulatory.

## 2016-10-22 NOTE — ED Provider Notes (Signed)
[facility] [place] DEPT Provider Note   CSN: [id number] Arrival date & time: 10/22/16  1029     History   Chief Complaint Chief Complaint  Patient presents with  . Foot Pain    l/foot pain x 4 days    HPI   Blood pressure 146/96, pulse 87, temperature 98.3 F (36.8 C), temperature source Oral, resp. rate 20, weight 119.7 kg, last menstrual period 10/16/2016,   Maryana Gignac Adebayo is a 45 y.o. female complaining of atraumatic painful swelling to left medial ankle onset 3 days ago with no associated history of DVT/PE, calf pain, leg swelling, chest pain, moderate assist, shortness of breath, exogenous estrogen. She thinks that she had a cyst removed to this foot in the remote past when she was 16 but she hadn't had an issue with swelling up until now. She rates her pain at 8 out of 10, she been taking Tylenol at home with little relief. No fever, chills, nausea, vomiting, abdominal pain, change in bowel or bladder habits.  Past Medical History:  Diagnosis Date  . Anemia   . Diabetes mellitus without complication ([place])   . Hypertension   . Obesity     Patient Active Problem List   Diagnosis Date Noted  . Lower respiratory infection 07/07/2015  . Bacterial vaginosis 07/07/2015  . Edema 03/26/2015  . Anal itching 10/14/2014  . Head or neck swelling, mass, or lump 09/25/2014  . Vaginitis and vulvovaginitis 09/10/2014  . Dizziness and giddiness 06/02/2014  . Other malaise and fatigue 05/30/2014  . Acute sinusitis with symptoms > 10 days 04/09/2014  . Essential hypertension, malignant 02/04/2014  . SOB (shortness of breath) 02/04/2014  . Allergic rhinitis 01/29/2014  . Hypokalemia 01/10/2014  . Back pain 01/06/2014  . Bilateral lower abdominal cramping 08/29/2013  . Menorrhagia 08/29/2013  . Unspecified contraceptive management 07/01/2013  . Encounter for routine gynecological examination 07/01/2013  . Headache(784.0) 03/31/2013  . HTN (hypertension) 02/21/2013  . Obesity,  unspecified 02/21/2013  . Microcytic anemia 02/21/2013    Past Surgical History:  Procedure Laterality Date  . BREAST SURGERY  08/2011   breast reduction  . FOOT SURGERY    . TUBAL LIGATION      OB History    No data available       Home Medications    Prior to Admission medications   Medication Sig Start Date End Date Taking? Authorizing Provider  acetaminophen (TYLENOL) 325 MG tablet Take 650 mg by mouth 2 (two) times daily as needed for moderate pain.   Yes Historical Provider, MD  amLODipine (NORVASC) 10 MG tablet Take 1 tablet (10 mg total) by mouth daily. 09/10/14  Yes Lucille Passy, MD  furosemide (LASIX) 40 MG tablet Take 40 mg by mouth daily. 06/01/16  Yes Historical Provider, MD  glipiZIDE (GLUCOTROL XL) 5 MG 24 hr tablet Take 5 mg by mouth daily. 09/26/16  Yes Historical Provider, MD  hydrochlorothiazide (HYDRODIURIL) 25 MG tablet Take 1 tablet (25 mg total) by mouth daily. 07/07/15  Yes Jaynee Eagles, PA-C  ipratropium (ATROVENT HFA) 17 MCG/ACT inhaler Inhale 2 puffs into the lungs every 6 (six) hours as needed for wheezing. 07/07/15  Yes Mario Mani, PA-C  KLOR-CON M20 20 MEQ tablet Take 20 mEq by mouth daily. 06/01/16  Yes Historical Provider, MD  losartan (COZAAR) 100 MG tablet Take 100 mg by mouth daily. 06/01/16  Yes Historical Provider, MD  metFORMIN (GLUCOPHAGE) 1000 MG tablet TAKE 1/2 TAB 2X WITH MEALS FOR 1-2 WEEKS, THEN  INCREASE TO 1 TAB X2 DAILY WITH MEALS BY MOUTH 06/13/16  Yes Historical Provider, MD  metoprolol succinate (TOPROL-XL) 50 MG 24 hr tablet Take 50 mg by mouth daily.  06/01/16  Yes Historical Provider, MD  VENTOLIN HFA 108 (90 Base) MCG/ACT inhaler INHALE 1 TO 2 PUFFS BY MOUTH EVERY 4 HOURS AS NEEDED FOR WHEEZING 06/01/16  Yes Historical Provider, MD  fluticasone (FLONASE) 50 MCG/ACT nasal spray Place 2 sprays into both nostrils daily. Patient not taking: Reported on 10/22/2016 08/16/15   Tatyana Kirichenko, PA-C  ondansetron (ZOFRAN) 4 MG tablet Take 1 tablet  (4 mg total) by mouth every 8 (eight) hours as needed for nausea or vomiting. 10/22/16   Tichina Koebel, PA-C  traMADol (ULTRAM) 50 MG tablet Take 1 tablet (50 mg total) by mouth every 6 (six) hours as needed. 10/22/16   Ardell Aaronson, PA-C  zolpidem (AMBIEN) 5 MG tablet Take 5 mg by mouth at bedtime. 05/09/16   Historical Provider, MD    Family History Family History  Problem Relation Age of Onset  . Hypertension Mother   . Heart failure Mother   . Heart attack Mother   . Cancer Father     Social History Social History  Substance Use Topics  . Smoking status: Never Smoker  . Smokeless tobacco: Never Used  . Alcohol use No     Allergies   Hydrocodone   Review of Systems Review of Systems  10 systems reviewed and found to be negative, except as noted in the HPI.   Physical Exam Updated Vital Signs BP 142/90 (BP Location: Right Arm)   Pulse 82   Temp 98.1 F (36.7 C) (Oral)   Resp 18   Wt 119.7 kg   LMP 10/16/2016 (Exact Date)   SpO2 98%   BMI 42.61 kg/m   Physical Exam  Constitutional: She is oriented to person, place, and time. She appears well-developed and well-nourished. No distress.  HENT:  Head: Normocephalic and atraumatic.  Mouth/Throat: Oropharynx is clear and moist.  Eyes: Conjunctivae and EOM are normal. Pupils are equal, round, and reactive to light.  Neck: Normal range of motion.  Cardiovascular: Normal rate, regular rhythm and intact distal pulses.   Pulmonary/Chest: Effort normal and breath sounds normal.  Abdominal: Soft. There is no tenderness.  Musculoskeletal: Normal range of motion. She exhibits edema and tenderness. She exhibits no deformity.  Left ankle with 5 cm focal tender area of edema on the medial aspect with no overlying warmth, induration. Distally neurovascularly intact, no focal calf tenderness.  Neurological: She is alert and oriented to person, place, and time.  Skin: She is not diaphoretic.  Psychiatric: She has a normal  mood and affect.  Nursing note and vitals reviewed.    ED Treatments / Results  Labs (all labs ordered are listed, but only abnormal results are displayed) Labs Reviewed  CBC WITH DIFFERENTIAL/PLATELET - Abnormal; Notable for the following:       Result Value   Hemoglobin 10.0 (*)    HCT 32.0 (*)    MCV 70.8 (*)    MCH 22.1 (*)    RDW 15.6 (*)    All other components within normal limits  COMPREHENSIVE METABOLIC PANEL - Abnormal; Notable for the following:    Potassium 3.4 (*)    Glucose, Bld 150 (*)    Calcium 8.8 (*)    All other components within normal limits  APTT  PROTIME-INR  I-STAT BETA HCG BLOOD, ED ([facility], [facility], [facility] ONLY)  EKG  EKG Interpretation None       Radiology Dg Ankle Complete Left  Result Date: 10/22/2016 CLINICAL DATA:  Swelling of medial malleolus for 4 days EXAM: LEFT ANKLE COMPLETE - 3+ VIEW COMPARISON:  None. FINDINGS: No acute fracture. No dislocation. Spurring at the inferior calcaneus. Soft tissue swelling over the mil malleolus is nonspecific. IMPRESSION: No acute bony pathology. Electronically Signed   By: Marybelle Killings M.D.   On: 10/22/2016 11:52    Procedures Procedures (including critical care time)  Medications Ordered in ED Medications  morphine 4 MG/ML injection 4 mg (not administered)  ondansetron (ZOFRAN) injection 4 mg (not administered)     Initial Impression / Assessment and Plan / ED Course  I have reviewed the triage vital signs and the nursing notes.  Pertinent labs & imaging results that were available during my care of the patient were reviewed by me and considered in my medical decision making (see chart for details).  Clinical Course     Vitals:   10/22/16 1044 10/22/16 1046 10/22/16 1136  BP: 146/96  142/90  Pulse: 87  82  Resp: 20  18  Temp: 98.3 F (36.8 C)  98.1 F (36.7 C)  TempSrc: Oral  Oral  SpO2: (!) 7%  98%  Weight:  119.7 kg     Medications  morphine 4 MG/ML injection 4 mg (not administered)    ondansetron (ZOFRAN) injection 4 mg (not administered)    Jillianne L Mayers is 45 y.o. female presenting with Painful lesion to medial left ankle. Will check basic blood work, obtain x-ray and DVT study this may be a cyst, there is no signs of infection no overlying skin changes.  Blood work with no acute abnormalities DVT study preliminarily negative as per tech. Patient given Ace wrap and recommended compression elevation, will follow with primary care physician next week.  Evaluation does not show pathology that would require ongoing emergent intervention or inpatient treatment. Pt is hemodynamically stable and mentating appropriately. Discussed findings and plan with patient/guardian, who agrees with care plan. All questions answered. Return precautions discussed and outpatient follow up given.   Final Clinical Impressions(s) / ED Diagnoses   Final diagnoses:  Edema of left lower extremity   New Prescriptions New Prescriptions   ONDANSETRON (ZOFRAN) 4 MG TABLET    Take 1 tablet (4 mg total) by mouth every 8 (eight) hours as needed for nausea or vomiting.   TRAMADOL (ULTRAM) 50 MG TABLET    Take 1 tablet (50 mg total) by mouth every 6 (six) hours as needed.     Monico Blitz, PA-C 10/22/16 1319    Lacretia Leigh, MD 10/24/16 [phone]

## 2016-10-22 NOTE — Discharge Instructions (Signed)
Rest, Ice intermittently (in the first 24-48 hours), Gentle compression with an Ace wrap, and elevate (Limb above the level of the heart).   Take vicodin for breakthrough pain, do not drink alcohol, drive, care for children or do other critical tasks while taking vicodin.  Please follow with your primary care doctor in the next 2 days for a check-up. They must obtain records for further management.   Do not hesitate to return to the Emergency Department for any new, worsening or concerning symptoms.

## 2016-12-28 ENCOUNTER — Encounter (HOSPITAL_COMMUNITY): Payer: Self-pay | Admitting: Emergency Medicine

## 2016-12-28 DIAGNOSIS — Z5321 Procedure and treatment not carried out due to patient leaving prior to being seen by health care provider: Secondary | ICD-10-CM | POA: Insufficient documentation

## 2016-12-28 DIAGNOSIS — R1030 Lower abdominal pain, unspecified: Secondary | ICD-10-CM | POA: Diagnosis not present

## 2016-12-28 LAB — URINALYSIS, ROUTINE W REFLEX MICROSCOPIC
BILIRUBIN URINE: NEGATIVE
Glucose, UA: NEGATIVE mg/dL
KETONES UR: NEGATIVE mg/dL
Nitrite: NEGATIVE
PROTEIN: NEGATIVE mg/dL
Specific Gravity, Urine: 1.027 (ref 1.005–1.030)
pH: 5 (ref 5.0–8.0)

## 2016-12-28 NOTE — ED Triage Notes (Signed)
Pt comes with complaints of lower abdominal and back pain since this morning.  States it feels like a consistent strong cramp.  Endorses nausea without emesis.  Denies diarrhea. States she has the urgency to urinate due to the cramps.

## 2016-12-29 ENCOUNTER — Emergency Department (HOSPITAL_COMMUNITY)
Admission: EM | Admit: 2016-12-29 | Discharge: 2016-12-29 | Disposition: A | Discharge status: Home / Self Care | Payer: Commercial Managed Care - PPO | Attending: Emergency Medicine | Admitting: Emergency Medicine

## 2016-12-29 ENCOUNTER — Emergency Department (HOSPITAL_COMMUNITY)
Admission: EM | Admit: 2016-12-29 | Discharge: 2016-12-29 | Disposition: A | Discharge status: Home / Self Care | Payer: Commercial Managed Care - PPO | Source: Home / Self Care

## 2016-12-29 ENCOUNTER — Encounter (HOSPITAL_COMMUNITY): Payer: Self-pay | Admitting: *Deleted

## 2016-12-29 DIAGNOSIS — R1030 Lower abdominal pain, unspecified: Secondary | ICD-10-CM | POA: Insufficient documentation

## 2016-12-29 DIAGNOSIS — Z5321 Procedure and treatment not carried out due to patient leaving prior to being seen by health care provider: Secondary | ICD-10-CM

## 2016-12-29 LAB — COMPREHENSIVE METABOLIC PANEL
ALBUMIN: 3.6 g/dL (ref 3.5–5.0)
ALK PHOS: 49 U/L (ref 38–126)
ALT: 18 U/L (ref 14–54)
ALT: 20 U/L (ref 14–54)
ANION GAP: 7 (ref 5–15)
ANION GAP: 8 (ref 5–15)
AST: 17 U/L (ref 15–41)
AST: 19 U/L (ref 15–41)
Albumin: 3.8 g/dL (ref 3.5–5.0)
Alkaline Phosphatase: 50 U/L (ref 38–126)
BUN: 10 mg/dL (ref 6–20)
BUN: 10 mg/dL (ref 6–20)
CALCIUM: 9 mg/dL (ref 8.9–10.3)
CHLORIDE: 105 mmol/L (ref 101–111)
CO2: 25 mmol/L (ref 22–32)
CO2: 26 mmol/L (ref 22–32)
Calcium: 9.4 mg/dL (ref 8.9–10.3)
Chloride: 105 mmol/L (ref 101–111)
Creatinine, Ser: 0.77 mg/dL (ref 0.44–1.00)
Creatinine, Ser: 0.81 mg/dL (ref 0.44–1.00)
GFR calc Af Amer: >60 mL/min (ref >60)
GFR calc non Af Amer: >60 mL/min (ref >60)
GFR calc non Af Amer: >60 mL/min (ref >60)
GLUCOSE: 106 mg/dL — AB (ref 65–99)
Glucose, Bld: 115 mg/dL — ABNORMAL HIGH (ref 65–99)
Potassium: 3.1 mmol/L — ABNORMAL LOW (ref 3.5–5.1)
Potassium: 3.3 mmol/L — ABNORMAL LOW (ref 3.5–5.1)
SODIUM: 138 mmol/L (ref 135–145)
SODIUM: 138 mmol/L (ref 135–145)
TOTAL PROTEIN: 7 g/dL (ref 6.5–8.1)
Total Bilirubin: 0.5 mg/dL (ref 0.3–1.2)
Total Bilirubin: 0.6 mg/dL (ref 0.3–1.2)
Total Protein: 7.1 g/dL (ref 6.5–8.1)

## 2016-12-29 LAB — CBC
HCT: 31.8 % — ABNORMAL LOW (ref 36.0–46.0)
HCT: 33.1 % — ABNORMAL LOW (ref 36.0–46.0)
HEMOGLOBIN: 10.3 g/dL — AB (ref 12.0–15.0)
HEMOGLOBIN: 10.4 g/dL — AB (ref 12.0–15.0)
MCH: 22.3 pg — ABNORMAL LOW (ref 26.0–34.0)
MCH: 22.5 pg — AB (ref 26.0–34.0)
MCHC: 31.4 g/dL (ref 30.0–36.0)
MCHC: 32.4 g/dL (ref 30.0–36.0)
MCV: 69.4 fL — ABNORMAL LOW (ref 78.0–100.0)
MCV: 70.9 fL — AB (ref 78.0–100.0)
Platelets: 198 10*3/uL (ref 150–400)
Platelets: 211 10*3/uL (ref 150–400)
RBC: 4.58 MIL/uL (ref 3.87–5.11)
RBC: 4.67 MIL/uL (ref 3.87–5.11)
RDW: 16.6 % — ABNORMAL HIGH (ref 11.5–15.5)
RDW: 16.9 % — ABNORMAL HIGH (ref 11.5–15.5)
WBC: 4.9 10*3/uL (ref 4.0–10.5)
WBC: 6 10*3/uL (ref 4.0–10.5)

## 2016-12-29 LAB — I-STAT BETA HCG BLOOD, ED (MC, WL, AP ONLY): I-stat hCG, quantitative: <5 m[IU]/mL (ref <5)

## 2016-12-29 LAB — LIPASE, BLOOD
Lipase: 26 U/L (ref 11–51)
Lipase: 27 U/L (ref 11–51)

## 2016-12-29 MED ORDER — OXYCODONE-ACETAMINOPHEN 5-325 MG PO TABS
ORAL_TABLET | ORAL | Status: AC
  Filled 2016-12-29: qty 1

## 2016-12-29 MED ORDER — OXYCODONE-ACETAMINOPHEN 5-325 MG PO TABS
1.0000 | ORAL_TABLET | ORAL | Status: DC | PRN
  Administered 2016-12-29: 1 via ORAL

## 2016-12-29 NOTE — ED Notes (Addendum)
When day shift came in, Pt no longer in the lobby.

## 2016-12-29 NOTE — ED Notes (Signed)
Did not answer times 2

## 2016-12-29 NOTE — ED Triage Notes (Signed)
Pt is here with lower abdominal pain/pressure that started yesterday.  Denies diarrhea or constipation. Denies any vaginal bleeding or discharge.  LMP - stopped 12/27/16.  PT was recently started on Trulisty for diabetes last friday

## 2017-01-16 DIAGNOSIS — Z79899 Other long term (current) drug therapy: Secondary | ICD-10-CM | POA: Diagnosis not present

## 2017-01-16 DIAGNOSIS — E119 Type 2 diabetes mellitus without complications: Secondary | ICD-10-CM | POA: Diagnosis not present

## 2017-01-16 DIAGNOSIS — J189 Pneumonia, unspecified organism: Secondary | ICD-10-CM | POA: Insufficient documentation

## 2017-01-16 DIAGNOSIS — J111 Influenza due to unidentified influenza virus with other respiratory manifestations: Secondary | ICD-10-CM | POA: Diagnosis not present

## 2017-01-16 DIAGNOSIS — J45901 Unspecified asthma with (acute) exacerbation: Secondary | ICD-10-CM | POA: Insufficient documentation

## 2017-01-16 DIAGNOSIS — R05 Cough: Secondary | ICD-10-CM | POA: Diagnosis present

## 2017-01-16 DIAGNOSIS — I10 Essential (primary) hypertension: Secondary | ICD-10-CM | POA: Insufficient documentation

## 2017-01-16 DIAGNOSIS — Z7984 Long term (current) use of oral hypoglycemic drugs: Secondary | ICD-10-CM | POA: Insufficient documentation

## 2017-01-17 ENCOUNTER — Emergency Department (HOSPITAL_COMMUNITY)
Admission: EM | Admit: 2017-01-17 | Discharge: 2017-01-17 | Disposition: A | Discharge status: Home / Self Care | Payer: Commercial Managed Care - PPO | Attending: Emergency Medicine | Admitting: Emergency Medicine

## 2017-01-17 ENCOUNTER — Emergency Department (HOSPITAL_COMMUNITY): Payer: Commercial Managed Care - PPO

## 2017-01-17 ENCOUNTER — Encounter (HOSPITAL_COMMUNITY): Payer: Self-pay

## 2017-01-17 DIAGNOSIS — R69 Illness, unspecified: Secondary | ICD-10-CM

## 2017-01-17 DIAGNOSIS — J189 Pneumonia, unspecified organism: Secondary | ICD-10-CM

## 2017-01-17 DIAGNOSIS — J45901 Unspecified asthma with (acute) exacerbation: Secondary | ICD-10-CM

## 2017-01-17 DIAGNOSIS — J111 Influenza due to unidentified influenza virus with other respiratory manifestations: Secondary | ICD-10-CM

## 2017-01-17 MED ORDER — OSELTAMIVIR PHOSPHATE 75 MG PO CAPS: 75.0000 mg | ORAL_CAPSULE | Freq: Two times a day (BID) | ORAL | 0 refills | Status: DC

## 2017-01-17 MED ORDER — PREDNISONE 20 MG PO TABS
60.0000 mg | ORAL_TABLET | Freq: Once | ORAL | Status: AC
  Administered 2017-01-17: 60 mg via ORAL
  Filled 2017-01-17: qty 3

## 2017-01-17 MED ORDER — AZITHROMYCIN 250 MG PO TABS: 250.0000 mg | ORAL_TABLET | Freq: Every day | ORAL | 0 refills | Status: DC

## 2017-01-17 MED ORDER — OSELTAMIVIR PHOSPHATE 75 MG PO CAPS
75.0000 mg | ORAL_CAPSULE | Freq: Once | ORAL | Status: AC
Start: 2017-01-17 — End: 2017-01-17
  Administered 2017-01-17: 75 mg via ORAL
  Filled 2017-01-17: qty 1

## 2017-01-17 MED ORDER — PREDNISONE 20 MG PO TABS: 60.0000 mg | ORAL_TABLET | Freq: Every day | ORAL | 0 refills | Status: DC

## 2017-01-17 MED ORDER — IBUPROFEN 800 MG PO TABS
800.0000 mg | ORAL_TABLET | Freq: Once | ORAL | Status: AC
  Administered 2017-01-17: 800 mg via ORAL
  Filled 2017-01-17: qty 1

## 2017-01-17 MED ORDER — IPRATROPIUM BROMIDE 0.02 % IN SOLN
1.0000 mg | Freq: Once | RESPIRATORY_TRACT | Status: AC
  Administered 2017-01-17: 1 mg via RESPIRATORY_TRACT
  Filled 2017-01-17: qty 5

## 2017-01-17 MED ORDER — GUAIFENESIN-CODEINE 100-10 MG/5ML PO SOLN: 10.0000 mL | Freq: Four times a day (QID) | ORAL | 0 refills | Status: DC | PRN

## 2017-01-17 MED ORDER — ALBUTEROL SULFATE (2.5 MG/3ML) 0.083% IN NEBU
5.0000 mg | INHALATION_SOLUTION | Freq: Once | RESPIRATORY_TRACT | Status: AC
  Administered 2017-01-17: 5 mg via RESPIRATORY_TRACT
  Filled 2017-01-17: qty 6

## 2017-01-17 MED ORDER — GUAIFENESIN-CODEINE 100-10 MG/5ML PO SOLN
10.0000 mL | Freq: Once | ORAL | Status: AC
  Administered 2017-01-17: 10 mL via ORAL
  Filled 2017-01-17: qty 10

## 2017-01-17 MED ORDER — ALBUTEROL SULFATE (2.5 MG/3ML) 0.083% IN NEBU
INHALATION_SOLUTION | RESPIRATORY_TRACT | Status: AC
  Filled 2017-01-17: qty 6

## 2017-01-17 MED ORDER — ONDANSETRON 4 MG PO TBDP: 4.0000 mg | ORAL_TABLET | Freq: Three times a day (TID) | ORAL | 0 refills | Status: DC | PRN

## 2017-01-17 MED ORDER — AZITHROMYCIN 250 MG PO TABS
500.0000 mg | ORAL_TABLET | Freq: Once | ORAL | Status: AC
  Administered 2017-01-17: 500 mg via ORAL
  Filled 2017-01-17: qty 2

## 2017-01-17 MED ORDER — ALBUTEROL SULFATE HFA 108 (90 BASE) MCG/ACT IN AERS: 1.0000 | INHALATION_SPRAY | RESPIRATORY_TRACT | 2 refills | Status: DC | PRN

## 2017-01-17 MED ORDER — ALBUTEROL (5 MG/ML) CONTINUOUS INHALATION SOLN
10.0000 mg/h | INHALATION_SOLUTION | Freq: Once | RESPIRATORY_TRACT | Status: AC
  Administered 2017-01-17: 10 mg/h via RESPIRATORY_TRACT
  Filled 2017-01-17: qty 20

## 2017-01-17 NOTE — ED Notes (Signed)
Pt states she feels a little better since her breathing treatment no complaints noted at this time

## 2017-01-17 NOTE — ED Provider Notes (Signed)
TIME SEEN: 4:05 AM  CHIEF COMPLAINT: Cough, wheezing  HPI: Pt is a 46 y.o. female with history of hypertension, non-insulin-dependent diabetes, asthma who presents to the emergency department with 2-3 days of productive cough with yellow sputum, fever of 102, wheezing, sore throat from coughing, body aches. Her granddaughter had similar symptoms and was flu positive. No vomiting or diarrhea. No chest pain or shortness of breath. States she feel she needs a breathing treatment. Has an albuterol inhaler at home.  ROS: See HPI Constitutional: + fever  Eyes: no drainage  ENT: no runny nose   Cardiovascular:  no chest pain  Resp: no SOB; + cough GI: no vomiting or diarrhea GU: no dysuria Integumentary: no rash  Allergy: no hives  Musculoskeletal: no leg swelling  Neurological: no slurred speech ROS otherwise negative  PAST MEDICAL HISTORY/PAST SURGICAL HISTORY:  Past Medical History:  Diagnosis Date  . Anemia   . Diabetes mellitus without complication ([place])   . Hypertension   . Obesity     MEDICATIONS:  Prior to Admission medications   Medication Sig Start Date End Date Taking? Authorizing Provider  acetaminophen (TYLENOL) 325 MG tablet Take 650 mg by mouth 2 (two) times daily as needed for moderate pain.    Historical Provider, MD  amLODipine (NORVASC) 10 MG tablet Take 1 tablet (10 mg total) by mouth daily. 09/10/14   Lucille Passy, MD  fluticasone (FLONASE) 50 MCG/ACT nasal spray Place 2 sprays into both nostrils daily. Patient not taking: Reported on 10/22/2016 08/16/15   Tatyana Kirichenko, PA-C  furosemide (LASIX) 40 MG tablet Take 40 mg by mouth daily. 06/01/16   Historical Provider, MD  glipiZIDE (GLUCOTROL XL) 5 MG 24 hr tablet Take 5 mg by mouth daily. 09/26/16   Historical Provider, MD  hydrochlorothiazide (HYDRODIURIL) 25 MG tablet Take 1 tablet (25 mg total) by mouth daily. 07/07/15   Jaynee Eagles, PA-C  ipratropium (ATROVENT HFA) 17 MCG/ACT inhaler Inhale 2 puffs into the  lungs every 6 (six) hours as needed for wheezing. 07/07/15   Jaynee Eagles, PA-C  KLOR-CON M20 20 MEQ tablet Take 20 mEq by mouth daily. 06/01/16   Historical Provider, MD  losartan (COZAAR) 100 MG tablet Take 100 mg by mouth daily. 06/01/16   Historical Provider, MD  metFORMIN (GLUCOPHAGE) 1000 MG tablet TAKE 1/2 TAB 2X WITH MEALS FOR 1-2 WEEKS, THEN INCREASE TO 1 TAB X2 DAILY WITH MEALS BY MOUTH 06/13/16   Historical Provider, MD  metoprolol succinate (TOPROL-XL) 50 MG 24 hr tablet Take 50 mg by mouth daily.  06/01/16   Historical Provider, MD  ondansetron (ZOFRAN) 4 MG tablet Take 1 tablet (4 mg total) by mouth every 8 (eight) hours as needed for nausea or vomiting. 10/22/16   Nicole Pisciotta, PA-C  traMADol (ULTRAM) 50 MG tablet Take 1 tablet (50 mg total) by mouth every 6 (six) hours as needed. 10/22/16   Nicole Pisciotta, PA-C  VENTOLIN HFA 108 (90 Base) MCG/ACT inhaler INHALE 1 TO 2 PUFFS BY MOUTH EVERY 4 HOURS AS NEEDED FOR WHEEZING 06/01/16   Historical Provider, MD  zolpidem (AMBIEN) 5 MG tablet Take 5 mg by mouth at bedtime. 05/09/16   Historical Provider, MD    ALLERGIES:  Allergies  Allergen Reactions  . Hydrocodone Nausea Only    SOCIAL HISTORY:  Social History  Substance Use Topics  . Smoking status: Never Smoker  . Smokeless tobacco: Never Used  . Alcohol use No    FAMILY HISTORY: Family History  Problem  Relation Age of Onset  . Hypertension Mother   . Heart failure Mother   . Heart attack Mother   . Cancer Father     EXAM: BP (!) 144/104 (BP Location: Left Arm)   Pulse 83   Temp 98.9 F (37.2 C) (Oral)   Resp 20   LMP 12/26/2016 (Approximate)   SpO2 97%  CONSTITUTIONAL: Alert and oriented and responds appropriately to questions. Well-appearing; well-nourished HEAD: Normocephalic EYES: Conjunctivae clear, PERRL, EOMI ENT: normal nose; no rhinorrhea; moist mucous membranes; No pharyngeal erythema or petechiae, no tonsillar hypertrophy or exudate, no uvular deviation,  no unilateral swelling, no trismus or drooling, no muffled voice, normal phonation, no stridor, no dental caries present, no drainable dental abscess noted, no Ludwig's angina, tongue sits flat in the bottom of the mouth, no angioedema, no facial erythema or warmth, no facial swelling; no pain with movement of the neck NECK: Supple, no meningismus, no nuchal rigidity, no LAD  CARD: RRR; S1 and S2 appreciated; no murmurs, no clicks, no rubs, no gallops RESP: Normal chest excursion without splinting or tachypnea; breath sounds equal bilaterally but patient does have expiratory wheezes, no rhonchi, no rales, no hypoxia or respiratory distress, speaking full sentences ABD/GI: Normal bowel sounds; non-distended; soft, non-tender, no rebound, no guarding, no peritoneal signs, no hepatosplenomegaly BACK:  The back appears normal and is non-tender to palpation, there is no CVA tenderness EXT: Normal ROM in all joints; non-tender to palpation; no edema; normal capillary refill; no cyanosis, no calf tenderness or swelling    SKIN: Normal color for age and race; warm; no rash NEURO: Moves all extremities equally PSYCH: The patient's mood and manner are appropriate. Grooming and personal hygiene are appropriate.  MEDICAL DECISION MAKING: Patient here with flulike symptoms causing mild asthma exacerbation. We'll treat with albuterol, prednisone. We'll also give ibuprofen, guaifenesin with codeine for symptomatic relief.  ED PROGRESS: X-ray shows possible new right linear lung base opacity. Will start on azithromycin for possible community-acquired pneumonia. Have offered her Tamiflu and she states she is not sure she would want this medication. We'll discharge with prescription for the same but have discussed risk and benefits. I do not feel she needs routine flu testing given she will not be admitted to the hospital and is not in respiratory distress. Granddaughter recently tested flu positive.   6:00 AM  Pt has  better aeration now and is wheezing more after 5 mg of albuterol. Will give continuous albuterol nebulizer treatment with Atrovent. Still no hypoxia or respiratory distress. We'll reassess after CAT.   6:50 AM  Apologized to patient and husband for delay in receiving continuous albuterol treatment. Patient will need to be reassessed after CAT but anticipate discharge home and patient would like to be discharged. Plan is to discharge with azithromycin for community acquired pneumonia, Tamiflu for possible influenza given recent positive contacts at home, albuterol and prednisone for asthma exacerbation, guaifenesin with codeine for symptomatic relief. Have discussed with her return precautions and recommended outpatient PCP follow-up. She is comfortable with this plan.   I reviewed all nursing notes, vitals, pertinent old records, EKGs, labs, imaging (as available).   CRITICAL CARE Performed by: Nyra Jabs   Total critical care time: 35 minutes  Critical care time was exclusive of separately billable procedures and treating other patients.  Critical care was necessary to treat or prevent imminent or life-threatening deterioration.  Critical care was time spent personally by me on the following activities: development of treatment plan  with patient and/or surrogate as well as nursing, discussions with consultants, evaluation of patient's response to treatment, examination of patient, obtaining history from patient or surrogate, ordering and performing treatments and interventions, ordering and review of laboratory studies, ordering and review of radiographic studies, pulse oximetry and re-evaluation of patient's condition.          [place], DO 01/17/17 [id number]

## 2017-01-17 NOTE — ED Provider Notes (Signed)
Pt feels better at this time. Home with steroids, bronchodilators, tamiflu and abx. Understands to return to ER for new or worsening symptoms   Jola Schmidt, MD 01/17/17 0830

## 2017-01-17 NOTE — ED Notes (Signed)
X-ray at bedside

## 2017-01-17 NOTE — Progress Notes (Signed)
CAT STARTED  

## 2017-01-17 NOTE — ED Notes (Signed)
Rounded on lobby.  Delays explained.

## 2017-01-17 NOTE — ED Triage Notes (Signed)
Pt complaining of cough and sore throat x 2 days. Pt complaining of fever and chills. Pt states brownish sputum. Pt denies any nausea/vomiting or diarrhea.

## 2017-01-17 NOTE — Discharge Instructions (Addendum)
To find a primary care or specialty doctor please call [phone] or [phone] to access "[employer] Find a Doctor Service." ° °You may also go on the [facility] website at [email or web address] ° °There are also multiple [facility], [facility] and [facility] practices throughout the [place] that are frequently accepting new patients. You may find a clinic that is close to your home and contact them. ° °[facility] -  °[address] °[place] [address] °[phone] ° ° °[facility] -  °[address] °[place] [address] [phone] ° ° °[facility] - °[address]  °[place] [address] °[phone] ° ° °

## 2017-01-22 ENCOUNTER — Emergency Department (HOSPITAL_COMMUNITY): Payer: Commercial Managed Care - PPO

## 2017-01-22 ENCOUNTER — Encounter (HOSPITAL_COMMUNITY): Payer: Self-pay | Admitting: Emergency Medicine

## 2017-01-22 ENCOUNTER — Emergency Department (HOSPITAL_COMMUNITY)
Admission: EM | Admit: 2017-01-22 | Discharge: 2017-01-22 | Disposition: A | Discharge status: Home / Self Care | Payer: Commercial Managed Care - PPO | Attending: Emergency Medicine | Admitting: Emergency Medicine

## 2017-01-22 DIAGNOSIS — I10 Essential (primary) hypertension: Secondary | ICD-10-CM | POA: Diagnosis not present

## 2017-01-22 DIAGNOSIS — E119 Type 2 diabetes mellitus without complications: Secondary | ICD-10-CM | POA: Diagnosis not present

## 2017-01-22 DIAGNOSIS — J40 Bronchitis, not specified as acute or chronic: Secondary | ICD-10-CM | POA: Insufficient documentation

## 2017-01-22 DIAGNOSIS — R05 Cough: Secondary | ICD-10-CM | POA: Diagnosis present

## 2017-01-22 DIAGNOSIS — J9811 Atelectasis: Secondary | ICD-10-CM | POA: Diagnosis not present

## 2017-01-22 DIAGNOSIS — Z87891 Personal history of nicotine dependence: Secondary | ICD-10-CM | POA: Diagnosis not present

## 2017-01-22 DIAGNOSIS — Z79899 Other long term (current) drug therapy: Secondary | ICD-10-CM | POA: Diagnosis not present

## 2017-01-22 LAB — BASIC METABOLIC PANEL
Anion gap: 10 (ref 5–15)
BUN: 7 mg/dL (ref 6–20)
CALCIUM: 8.4 mg/dL — AB (ref 8.9–10.3)
CO2: 26 mmol/L (ref 22–32)
Chloride: 104 mmol/L (ref 101–111)
Creatinine, Ser: 0.83 mg/dL (ref 0.44–1.00)
GFR calc Af Amer: >60 mL/min (ref >60)
Glucose, Bld: 85 mg/dL (ref 65–99)
POTASSIUM: 3.2 mmol/L — AB (ref 3.5–5.1)
SODIUM: 140 mmol/L (ref 135–145)

## 2017-01-22 LAB — CBC WITH DIFFERENTIAL/PLATELET
BASOS ABS: 0 10*3/uL (ref 0.0–0.1)
Basophils Relative: 0 %
EOS ABS: 0 10*3/uL (ref 0.0–0.7)
EOS PCT: 0 %
HCT: 33.9 % — ABNORMAL LOW (ref 36.0–46.0)
Hemoglobin: 10.7 g/dL — ABNORMAL LOW (ref 12.0–15.0)
LYMPHS ABS: 3.8 10*3/uL (ref 0.7–4.0)
Lymphocytes Relative: 50 %
MCH: 22.3 pg — AB (ref 26.0–34.0)
MCHC: 31.6 g/dL (ref 30.0–36.0)
MCV: 70.6 fL — ABNORMAL LOW (ref 78.0–100.0)
Monocytes Absolute: 0.5 10*3/uL (ref 0.1–1.0)
Monocytes Relative: 7 %
Neutro Abs: 3.3 10*3/uL (ref 1.7–7.7)
Neutrophils Relative %: 43 %
Platelets: 213 10*3/uL (ref 150–400)
RBC: 4.8 MIL/uL (ref 3.87–5.11)
RDW: 16.6 % — AB (ref 11.5–15.5)
WBC: 7.6 10*3/uL (ref 4.0–10.5)

## 2017-01-22 MED ORDER — PREDNISONE 20 MG PO TABS
60.0000 mg | ORAL_TABLET | Freq: Once | ORAL | Status: AC
  Administered 2017-01-22: 60 mg via ORAL
  Filled 2017-01-22: qty 3

## 2017-01-22 MED ORDER — IPRATROPIUM-ALBUTEROL 0.5-2.5 (3) MG/3ML IN SOLN
3.0000 mL | Freq: Once | RESPIRATORY_TRACT | Status: AC
  Administered 2017-01-22: 3 mL via RESPIRATORY_TRACT
  Filled 2017-01-22: qty 3

## 2017-01-22 MED ORDER — PREDNISONE 10 MG (21) PO TBPK: 10.0000 mg | ORAL_TABLET | Freq: Every day | ORAL | 0 refills | Status: DC

## 2017-01-22 MED ORDER — BENZONATATE 100 MG PO CAPS: 100.0000 mg | ORAL_CAPSULE | Freq: Three times a day (TID) | ORAL | 0 refills | Status: DC

## 2017-01-22 MED ORDER — MAGNESIUM SULFATE 2 GM/50ML IV SOLN
2.0000 g | Freq: Once | INTRAVENOUS | Status: AC
  Administered 2017-01-22: 2 g via INTRAVENOUS
  Filled 2017-01-22: qty 50

## 2017-01-22 NOTE — ED Provider Notes (Signed)
[facility] DEPT Provider Note   CSN: [id number] Arrival date & time: 01/22/17  [id number]     History   Chief Complaint Chief Complaint  Patient presents with  . Cough  . Nasal Congestion    HPI Grace Gallagher is a 46 y.o. female.  HPI Grace Gallagher is a 46 y.o. female history of diabetes, hypertension, anemia, presents to emergency department complaining of cough and shortness of breath. Patient reports cough for almost 2 weeks now. She was seen in emergency department 5 days ago for the same. At that time chest x-ray showed possible infiltrate versus atelectasis at the right lung base. Patient was treated in emergency department for wheezing and cough with multiple neb treatments, was discharged home with prednisone, Z-Pak, codeine cough medication, and an inhaler. Patient states she has been using her inhaler every 4 hours. She states she finished Z-Pak and prednisone. She states she feels like cough is getting worse. She denies any more fevers. Denies any nasal congestion or sore throat. Denies any chest pain other than when coughing. Denies any shortness of breath. States she is coughing of thick green sputum.  Past Medical History:  Diagnosis Date  . Anemia   . Diabetes mellitus without complication ([place])   . Hypertension   . Obesity     Patient Active Problem List   Diagnosis Date Noted  . Lower respiratory infection 07/07/2015  . Bacterial vaginosis 07/07/2015  . Edema 03/26/2015  . Anal itching 10/14/2014  . Head or neck swelling, mass, or lump 09/25/2014  . Vaginitis and vulvovaginitis 09/10/2014  . Dizziness and giddiness 06/02/2014  . Other malaise and fatigue 05/30/2014  . Acute sinusitis with symptoms > 10 days 04/09/2014  . Essential hypertension, malignant 02/04/2014  . SOB (shortness of breath) 02/04/2014  . Allergic rhinitis 01/29/2014  . Hypokalemia 01/10/2014  . Back pain 01/06/2014  . Bilateral lower abdominal cramping 08/29/2013  . Menorrhagia  08/29/2013  . Unspecified contraceptive management 07/01/2013  . Encounter for routine gynecological examination 07/01/2013  . Headache(784.0) 03/31/2013  . HTN (hypertension) 02/21/2013  . Obesity, unspecified 02/21/2013  . Microcytic anemia 02/21/2013    Past Surgical History:  Procedure Laterality Date  . BREAST SURGERY  08/2011   breast reduction  . FOOT SURGERY    . TUBAL LIGATION      OB History    No data available       Home Medications    Prior to Admission medications   Medication Sig Start Date End Date Taking? Authorizing Provider  albuterol (PROVENTIL HFA;VENTOLIN HFA) 108 (90 Base) MCG/ACT inhaler Inhale 1-2 puffs into the lungs every 4 (four) hours as needed for wheezing or shortness of breath. 01/17/17   Kristen N Buehner, DO  amLODipine (NORVASC) 10 MG tablet Take 1 tablet (10 mg total) by mouth daily. 09/10/14   Lucille Passy, MD  azithromycin (ZITHROMAX) 250 MG tablet Take 1 tablet (250 mg total) by mouth daily. Start this prescription on 01/18/17. 01/17/17   Delice Bison [place], DO  guaiFENesin-codeine 100-10 MG/5ML syrup Take 10 mLs by mouth every 6 (six) hours as needed for cough. 01/17/17   Kristen N Salts, DO  hydrochlorothiazide (HYDRODIURIL) 25 MG tablet Take 1 tablet (25 mg total) by mouth daily. 07/07/15   Jaynee Eagles, PA-C  ipratropium (ATROVENT HFA) 17 MCG/ACT inhaler Inhale 2 puffs into the lungs every 6 (six) hours as needed for wheezing. 07/07/15   Jaynee Eagles, PA-C  KLOR-CON M20 20 MEQ tablet Take 20  mEq by mouth daily. 06/01/16   Historical Provider, MD  metFORMIN (GLUCOPHAGE) 1000 MG tablet Take 1,000 mg by mouth 2 (two) times daily with a meal.    Historical Provider, MD  metoprolol succinate (TOPROL-XL) 50 MG 24 hr tablet Take 50 mg by mouth as needed.  06/01/16   Historical Provider, MD  ondansetron (ZOFRAN ODT) 4 MG disintegrating tablet Take 1 tablet (4 mg total) by mouth every 8 (eight) hours as needed for nausea or vomiting. 01/17/17   Delice Bison [place], DO    oseltamivir (TAMIFLU) 75 MG capsule Take 1 capsule (75 mg total) by mouth every 12 (twelve) hours. 01/17/17   Kristen N Self, DO  predniSONE (DELTASONE) 20 MG tablet Take 3 tablets (60 mg total) by mouth daily. 01/17/17   Kristen N Scarlett, DO  VENTOLIN HFA 108 (90 Base) MCG/ACT inhaler INHALE 1 TO 2 PUFFS BY MOUTH EVERY 4 HOURS AS NEEDED FOR WHEEZING 06/01/16   Historical Provider, MD    Family History Family History  Problem Relation Age of Onset  . Hypertension Mother   . Heart failure Mother   . Heart attack Mother   . Cancer Father     Social History Social History  Substance Use Topics  . Smoking status: Never Smoker  . Smokeless tobacco: Never Used  . Alcohol use No     Allergies   Hydrocodone   Review of Systems Review of Systems  Constitutional: Negative for chills and fever.  HENT: Negative for congestion.   Respiratory: Positive for cough, shortness of breath and wheezing. Negative for chest tightness.   Cardiovascular: Negative for chest pain, palpitations and leg swelling.  Gastrointestinal: Negative for abdominal pain, diarrhea, nausea and vomiting.  Genitourinary: Negative for dysuria, flank pain and pelvic pain.  Musculoskeletal: Negative for arthralgias, myalgias, neck pain and neck stiffness.  Skin: Negative for rash.  Neurological: Negative for dizziness, weakness and headaches.  All other systems reviewed and are negative.    Physical Exam Updated Vital Signs BP (!) 166/105 (BP Location: Left Arm)   Pulse 82   Temp 98.4 F (36.9 C) (Oral)   Resp 18   Ht 5\' 7"  (1.702 m)   Wt 111.6 kg   LMP 12/26/2016 (Approximate)   SpO2 98%   BMI 38.53 kg/m   Physical Exam  Constitutional: She appears well-developed and well-nourished. No distress.  HENT:  Head: Normocephalic.  Eyes: Conjunctivae are normal.  Neck: Neck supple.  Cardiovascular: Normal rate, regular rhythm and normal heart sounds.   Pulmonary/Chest: Effort normal. No respiratory distress.  She has wheezes. She has no rales.  Inspiratory and expiratory wheezes bilaterally  Abdominal: Soft. Bowel sounds are normal. She exhibits no distension. There is no tenderness. There is no rebound.  Musculoskeletal: She exhibits no edema.  Neurological: She is alert.  Skin: Skin is warm and dry.  Psychiatric: She has a normal mood and affect. Her behavior is normal.  Nursing note and vitals reviewed.    ED Treatments / Results  Labs (all labs ordered are listed, but only abnormal results are displayed) Labs Reviewed  CBC WITH DIFFERENTIAL/PLATELET - Abnormal; Notable for the following:       Result Value   Hemoglobin 10.7 (*)    HCT 33.9 (*)    MCV 70.6 (*)    MCH 22.3 (*)    RDW 16.6 (*)    All other components within normal limits  BASIC METABOLIC PANEL - Abnormal; Notable for the following:    Potassium  3.2 (*)    Calcium 8.4 (*)    All other components within normal limits    EKG  EKG Interpretation None       Radiology Dg Chest 2 View  Result Date: 01/22/2017 CLINICAL DATA:  Cough and shortness of breath EXAM: CHEST  2 VIEW COMPARISON:  01/17/2017 FINDINGS: Bandlike atelectasis at the right base is stable. Progressed lingular atelectasis. There is no edema, consolidation, effusion, or pneumothorax. Ovoid density overlapping the posterior heart in the lateral projection is stable since at least 2013 and could be a calcified lymph node or prominent vessel. Normal heart size and aortic contours. No acute osseous finding. IMPRESSION: Atelectasis at the bases, progressed on the left since exam 5 days ago. Electronically Signed   By: Monte Fantasia M.D.   On: 01/22/2017 09:19    Procedures Procedures (including critical care time)  Medications Ordered in ED Medications - No data to display   Initial Impression / Assessment and Plan / ED Course  I have reviewed the triage vital signs and the nursing notes.  Pertinent labs & imaging results that were available  during my care of the patient were reviewed by me and considered in my medical decision making (see chart for details).   patient in emergency department with persistent cough for 2 weeks. She is wheezing on exam. Her vital signs are normal other than htn. Will repeat chest x-ray, breathing treatment ordered, basic labs.   Vitals:   01/22/17 0709 01/22/17 0717  BP: (!) 166/105   Pulse: 82   Resp: 18   Temp: 98.4 F (36.9 C)   TempSrc: Oral   SpO2: 98%   Weight:  111.6 kg  Height:  5\' 7"  (1.702 m)   Patient's x-ray showing worsening atelectasis, otherwise negative. Her labs with no significant findings other than hypokalemia. Patient received 3 breathing treatments, prednisone 60 mg, magnesium 2 g. She is feeling better. She still has the cough but wheezing and shortness of breath has improved. She is afebrile, nontoxic appearing. She is PERC negative. Suspect persistent bronchitis as the cause of her persistent cough and wheezing. Patient ambulated in the hallway with pulse ox, maintaining her oxygen saturation above 95% on room air. Discussed with Dr. Ellender Hose. Will start on a longer prednisone taper. Will give Tessalon for cough. Instructed to follow-up with her doctor. Return if worsening.  Vitals:   01/22/17 1015 01/22/17 1045 01/22/17 1100 01/22/17 1115  BP: 160/89 139/72 146/96 169/90  Pulse: (!) 59 73 75 79  Resp:      Temp:      TempSrc:      SpO2: 96% 100% 96% 99%  Weight:      Height:         Final Clinical Impressions(s) / ED Diagnoses   Final diagnoses:  Bronchitis  Atelectasis    New Prescriptions New Prescriptions   BENZONATATE (TESSALON) 100 MG CAPSULE    Take 1 capsule (100 mg total) by mouth every 8 (eight) hours.   PREDNISONE (STERAPRED UNI-PAK 21 TAB) 10 MG (21) TBPK TABLET    Take 1 tablet (10 mg total) by mouth daily. Take 6 tabs by mouth daily  for 2 days, then 5 tabs for 2 days, then 4 tabs for 2 days, then 3 tabs for 2 days, 2 tabs for 2 days, then 1 tab  by mouth daily for 2 days     Jeannett Senior, PA-C 01/22/17 1354    Duffy Bruce, MD 01/23/17 [place]

## 2017-01-22 NOTE — ED Notes (Signed)
Patient transported to X-ray 

## 2017-01-22 NOTE — ED Notes (Signed)
Ambulated patient with pulse oximeter 97%O2 sat

## 2017-01-22 NOTE — ED Triage Notes (Signed)
Ptot. Stated, I was here on Monday for the same symptoms and I feel like Im worse.  Cough with congestion. Im fished with all the med. They gave me.

## 2017-01-22 NOTE — Discharge Instructions (Signed)
User inhaler 2-4 puffs every 4 hours. Prednisone as prescribed until all gone. Tessalon for cough. Use incentive spirometer every 1-2 hours to help you get rid of atelectasis. Follow-up with your family doctor. Return if worsening.

## 2017-04-12 DIAGNOSIS — IMO0001 Reserved for inherently not codable concepts without codable children: Secondary | ICD-10-CM | POA: Insufficient documentation

## 2017-04-12 DIAGNOSIS — Z8249 Family history of ischemic heart disease and other diseases of the circulatory system: Secondary | ICD-10-CM | POA: Insufficient documentation

## 2017-04-12 DIAGNOSIS — E1165 Type 2 diabetes mellitus with hyperglycemia: Secondary | ICD-10-CM

## 2017-08-17 ENCOUNTER — Encounter (HOSPITAL_COMMUNITY): Payer: Self-pay | Admitting: *Deleted

## 2017-08-17 ENCOUNTER — Emergency Department (HOSPITAL_COMMUNITY)
Admission: EM | Admit: 2017-08-17 | Discharge: 2017-08-17 | Disposition: A | Discharge status: Home / Self Care | Payer: Commercial Managed Care - PPO | Attending: Emergency Medicine | Admitting: Emergency Medicine

## 2017-08-17 DIAGNOSIS — Z5321 Procedure and treatment not carried out due to patient leaving prior to being seen by health care provider: Secondary | ICD-10-CM | POA: Insufficient documentation

## 2017-08-17 DIAGNOSIS — M791 Myalgia: Secondary | ICD-10-CM | POA: Insufficient documentation

## 2017-08-17 NOTE — ED Notes (Signed)
Pt called for vitals update. No reply.

## 2017-08-17 NOTE — ED Triage Notes (Signed)
The pt is c/o  Body aches burning in her nose and sinus since yesterday  No temp  lmp aug 17th

## 2017-08-17 NOTE — ED Notes (Signed)
Third call in lobby for vitals update. No reply.

## 2017-08-17 NOTE — ED Notes (Signed)
Second call in lobby for vitals check. No response

## 2017-11-29 ENCOUNTER — Encounter (HOSPITAL_COMMUNITY): Payer: Self-pay

## 2017-11-29 ENCOUNTER — Other Ambulatory Visit: Payer: Self-pay

## 2017-11-29 ENCOUNTER — Emergency Department (HOSPITAL_COMMUNITY)
Admission: EM | Admit: 2017-11-29 | Discharge: 2017-11-30 | Disposition: A | Discharge status: Home / Self Care | Payer: Commercial Managed Care - PPO | Attending: Emergency Medicine | Admitting: Emergency Medicine

## 2017-11-29 DIAGNOSIS — R109 Unspecified abdominal pain: Secondary | ICD-10-CM | POA: Diagnosis not present

## 2017-11-29 DIAGNOSIS — R1030 Lower abdominal pain, unspecified: Secondary | ICD-10-CM | POA: Diagnosis present

## 2017-11-29 DIAGNOSIS — Z79899 Other long term (current) drug therapy: Secondary | ICD-10-CM | POA: Insufficient documentation

## 2017-11-29 DIAGNOSIS — I10 Essential (primary) hypertension: Secondary | ICD-10-CM | POA: Insufficient documentation

## 2017-11-29 DIAGNOSIS — E119 Type 2 diabetes mellitus without complications: Secondary | ICD-10-CM | POA: Diagnosis not present

## 2017-11-29 LAB — LIPASE, BLOOD: Lipase: 36 U/L (ref 11–51)

## 2017-11-29 LAB — COMPREHENSIVE METABOLIC PANEL
ALT: 19 U/L (ref 14–54)
ANION GAP: 5 (ref 5–15)
AST: 19 U/L (ref 15–41)
Albumin: 3.5 g/dL (ref 3.5–5.0)
Alkaline Phosphatase: 57 U/L (ref 38–126)
BILIRUBIN TOTAL: 0.7 mg/dL (ref 0.3–1.2)
BUN: 13 mg/dL (ref 6–20)
CO2: 28 mmol/L (ref 22–32)
Calcium: 8.7 mg/dL — ABNORMAL LOW (ref 8.9–10.3)
Chloride: 106 mmol/L (ref 101–111)
Creatinine, Ser: 0.85 mg/dL (ref 0.44–1.00)
GFR calc Af Amer: >60 mL/min (ref >60)
Glucose, Bld: 126 mg/dL — ABNORMAL HIGH (ref 65–99)
POTASSIUM: 3.2 mmol/L — AB (ref 3.5–5.1)
Sodium: 139 mmol/L (ref 135–145)
TOTAL PROTEIN: 6.6 g/dL (ref 6.5–8.1)

## 2017-11-29 LAB — URINALYSIS, ROUTINE W REFLEX MICROSCOPIC
Bilirubin Urine: NEGATIVE
Glucose, UA: NEGATIVE mg/dL
Hgb urine dipstick: NEGATIVE
KETONES UR: NEGATIVE mg/dL
LEUKOCYTES UA: NEGATIVE
NITRITE: NEGATIVE
PH: 6 (ref 5.0–8.0)
Protein, ur: NEGATIVE mg/dL
Specific Gravity, Urine: 1.026 (ref 1.005–1.030)

## 2017-11-29 LAB — CBC
HEMATOCRIT: 31.7 % — AB (ref 36.0–46.0)
HEMOGLOBIN: 10 g/dL — AB (ref 12.0–15.0)
MCH: 22.2 pg — ABNORMAL LOW (ref 26.0–34.0)
MCHC: 31.5 g/dL (ref 30.0–36.0)
MCV: 70.4 fL — ABNORMAL LOW (ref 78.0–100.0)
Platelets: 217 10*3/uL (ref 150–400)
RBC: 4.5 MIL/uL (ref 3.87–5.11)
RDW: 15.4 % (ref 11.5–15.5)
WBC: 5.7 10*3/uL (ref 4.0–10.5)

## 2017-11-29 LAB — POC URINE PREG, ED: Preg Test, Ur: NEGATIVE

## 2017-11-29 NOTE — ED Provider Notes (Signed)
[facility] EMERGENCY DEPARTMENT Provider Note   CSN: [id number] Arrival date & time: 11/29/17  1949     History   Chief Complaint Chief Complaint  Patient presents with  . Abdominal Pain    HPI Grace Gallagher is a 46 y.o. female.  46yo F w/ PMH below including T2DM, HTN, anemia who p/w abdominal pain. She endorses 1 week of crampy, achy abdominal pain across her lower abdomen occasionally radiating to lower back that was initially intermittent but has become more constant. Currently pain is 9/10 in intensity. Bending over and walking around sometimes make it worse, no association with eating. She has had nausea today, no vomiting, diarrhea, constipation, or bloody/black stools. She reports urinary urgency, feeling like she needs to use the bathroom but does not urinate much. No dysuria or hematuria. No vaginal bleeding or discharge. No SOB, chest pain, fevers or cough/cold symptoms. She took tylenol for a few days with no relief.   The history is provided by the patient.    Past Medical History:  Diagnosis Date  . Anemia   . Diabetes mellitus without complication ([place])   . Hypertension   . Obesity     Patient Active Problem List   Diagnosis Date Noted  . Lower respiratory infection 07/07/2015  . Bacterial vaginosis 07/07/2015  . Edema 03/26/2015  . Anal itching 10/14/2014  . Head or neck swelling, mass, or lump 09/25/2014  . Vaginitis and vulvovaginitis 09/10/2014  . Dizziness and giddiness 06/02/2014  . Other malaise and fatigue 05/30/2014  . Acute sinusitis with symptoms > 10 days 04/09/2014  . Essential hypertension, malignant 02/04/2014  . SOB (shortness of breath) 02/04/2014  . Allergic rhinitis 01/29/2014  . Hypokalemia 01/10/2014  . Back pain 01/06/2014  . Bilateral lower abdominal cramping 08/29/2013  . Menorrhagia 08/29/2013  . Unspecified contraceptive management 07/01/2013  . Encounter for routine gynecological examination 07/01/2013    . Headache(784.0) 03/31/2013  . HTN (hypertension) 02/21/2013  . Obesity, unspecified 02/21/2013  . Microcytic anemia 02/21/2013    Past Surgical History:  Procedure Laterality Date  . BREAST SURGERY  08/2011   breast reduction  . FOOT SURGERY    . TUBAL LIGATION      OB History    No data available       Home Medications    Prior to Admission medications   Medication Sig Start Date End Date Taking? Authorizing Provider  amLODipine (NORVASC) 10 MG tablet Take 1 tablet (10 mg total) by mouth daily. 09/10/14  Yes Lucille Passy, MD  atorvastatin (LIPITOR) 20 MG tablet Take 20 mg by mouth daily. 10/09/17  Yes [provider]  hydrochlorothiazide (HYDRODIURIL) 25 MG tablet Take 1 tablet (25 mg total) by mouth daily. 07/07/15  Yes Jaynee Eagles, PA-C  metFORMIN (GLUCOPHAGE) 1000 MG tablet Take 1,000 mg by mouth 2 (two) times daily with a meal.   Yes [provider]  metoprolol succinate (TOPROL-XL) 50 MG 24 hr tablet Take 50 mg by mouth daily. Patient takes if she feels like blood pressure is high ( head hurting) 06/01/16  Yes [provider]  ramipril (ALTACE) 10 MG capsule Take 10 mg by mouth daily. 10/10/17  Yes [provider]  albuterol (PROVENTIL HFA;VENTOLIN HFA) 108 (90 Base) MCG/ACT inhaler Inhale 1-2 puffs into the lungs every 4 (four) hours as needed for wheezing or shortness of breath. Patient not taking: Reported on 11/29/2017 01/17/17   Ruland, Delice Bison, DO  azithromycin (ZITHROMAX) 250 MG  tablet Take 1 tablet (250 mg total) by mouth daily. Start this prescription on 01/18/17. Patient not taking: Reported on 11/29/2017 01/17/17   Shingledecker, Delice Bison, DO  benzonatate (TESSALON) 100 MG capsule Take 1 capsule (100 mg total) by mouth every 8 (eight) hours. Patient not taking: Reported on 11/29/2017 01/22/17   Jeannett Senior, PA-C  guaiFENesin-codeine 100-10 MG/5ML syrup Take 10 mLs by mouth every 6 (six) hours as needed for cough. Patient not taking:  Reported on 11/29/2017 01/17/17   Minella, Delice Bison, DO  ipratropium (ATROVENT HFA) 17 MCG/ACT inhaler Inhale 2 puffs into the lungs every 6 (six) hours as needed for wheezing. Patient not taking: Reported on 11/29/2017 07/07/15   Jaynee Eagles, PA-C  ondansetron (ZOFRAN ODT) 4 MG disintegrating tablet Take 1 tablet (4 mg total) by mouth every 8 (eight) hours as needed for nausea or vomiting. Patient not taking: Reported on 11/29/2017 01/17/17   Yakubov, Delice Bison, DO  oseltamivir (TAMIFLU) 75 MG capsule Take 1 capsule (75 mg total) by mouth every 12 (twelve) hours. Patient not taking: Reported on 11/29/2017 01/17/17   Pekala, Delice Bison, DO  predniSONE (STERAPRED UNI-PAK 21 TAB) 10 MG (21) TBPK tablet Take 1 tablet (10 mg total) by mouth daily. Take 6 tabs by mouth daily  for 2 days, then 5 tabs for 2 days, then 4 tabs for 2 days, then 3 tabs for 2 days, 2 tabs for 2 days, then 1 tab by mouth daily for 2 days Patient not taking: Reported on 11/29/2017 01/22/17   Jeannett Senior, PA-C    Family History Family History  Problem Relation Age of Onset  . Hypertension Mother   . Heart failure Mother   . Heart attack Mother   . Cancer Father     Social History Social History   Tobacco Use  . Smoking status: Never Smoker  . Smokeless tobacco: Never Used  Substance Use Topics  . Alcohol use: No  . Drug use: No     Allergies   Hydrocodone   Review of Systems Review of Systems All other systems reviewed and are negative except that which was mentioned in HPI   Physical Exam Updated Vital Signs BP 137/81   Pulse 71   Temp 98.2 F (36.8 C) (Oral)   Resp 16   LMP 11/24/2017   SpO2 98%   Physical Exam  Constitutional: She is oriented to person, place, and time. She appears well-developed and well-nourished. No distress.  HENT:  Head: Normocephalic and atraumatic.  Moist mucous membranes  Eyes: Conjunctivae are normal. Pupils are equal, round, and reactive to light.  Neck: Neck supple.    Cardiovascular: Normal rate, regular rhythm and normal heart sounds.  No murmur heard. Pulmonary/Chest: Effort normal and breath sounds normal.  Abdominal: Soft. Bowel sounds are normal. She exhibits no distension. There is tenderness in the right lower quadrant, suprapubic area and left lower quadrant. There is no rebound and no guarding.  Musculoskeletal: She exhibits no edema.  Neurological: She is alert and oriented to person, place, and time.  Fluent speech  Skin: Skin is warm and dry.  Psychiatric: She has a normal mood and affect. Judgment normal.  Nursing note and vitals reviewed.    ED Treatments / Results  Labs (all labs ordered are listed, but only abnormal results are displayed) Labs Reviewed  COMPREHENSIVE METABOLIC PANEL - Abnormal; Notable for the following components:      Result Value   Potassium 3.2 (*)    Glucose,  Bld 126 (*)    Calcium 8.7 (*)    All other components within normal limits  CBC - Abnormal; Notable for the following components:   Hemoglobin 10.0 (*)    HCT 31.7 (*)    MCV 70.4 (*)    MCH 22.2 (*)    All other components within normal limits  URINALYSIS, ROUTINE W REFLEX MICROSCOPIC - Abnormal; Notable for the following components:   APPearance HAZY (*)    All other components within normal limits  LIPASE, BLOOD  POC URINE PREG, ED    EKG  EKG Interpretation None       Radiology Ct Renal Stone Study  Result Date: 11/30/2017 CLINICAL DATA:  46 y/o  F; 1 week of right lower abdominal pain. EXAM: CT ABDOMEN AND PELVIS WITHOUT CONTRAST TECHNIQUE: Multidetector CT imaging of the abdomen and pelvis was performed following the standard protocol without IV contrast. COMPARISON:  None. FINDINGS: Lower chest: No acute abnormality. Hepatobiliary: Hepatic steatosis. No focal liver abnormality is seen. No gallstones, gallbladder wall thickening, or biliary dilatation. Pancreas: Unremarkable. No pancreatic ductal dilatation or surrounding  inflammatory changes. Spleen: Normal in size without focal abnormality. Adrenals/Urinary Tract: Adrenal glands are unremarkable. Kidneys are normal, without renal calculi, focal lesion, or hydronephrosis. Bladder is unremarkable. Stomach/Bowel: Stomach is within normal limits. Appendix appears normal. No evidence of bowel wall thickening, distention, or inflammatory changes. Vascular/Lymphatic: No significant vascular findings are present. No enlarged abdominal or pelvic lymph nodes. Reproductive: Uterus and bilateral adnexa are unremarkable. Bilateral tubal ligation. Other: No abdominal wall hernia or abnormality. No abdominopelvic ascites. 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 Mild lumbar spine spondylosis. No acute osseous abnormality identified. IMPRESSION: 1. No acute process identified. 2. Hepatic steatosis. Electronically Signed   By: Kristine Garbe M.D.   On: 11/30/2017 00:20    Procedures Procedures (including critical care time)  Medications Ordered in ED Medications - No data to display   Initial Impression / Assessment and Plan / ED Course  I have reviewed the triage vital signs and the nursing notes.  Pertinent labs & imaging results that were available during my care of the patient were reviewed by me and considered in my medical decision making (see chart for details).     Pt w/ 1 week of persistent abd pain, nausea today and 3-4 days of urinary frequency.  Well-appearing on exam with reassuring vital signs.  Tenderness across lower abdomen.  Lab work is unremarkable and shows normal lipase, normal LFTs, normal creatinine, normal CBC. I doubt PID or uterine pathology given no associated vaginal bleeding/discharge.   DDx includes Diverticulitis, less likely given no diarrhea Ureteral stone, less likely given no hematuria on UA Obtained CT which was negative. Pt tolerating PO.  Discussed supportive measures  and PCP follow-up if symptoms are persistent.  Extensively reviewed return precautions.  She voiced understanding and was discharged in satisfactory condition.   Final Clinical Impressions(s) / ED Diagnoses   Final diagnoses:  Abdominal pain, unspecified abdominal location    ED Discharge Orders    None       Little, Wenda Overland, MD 11/30/17 [phone]

## 2017-11-29 NOTE — ED Triage Notes (Signed)
Patient arrives at ED with family c/o abdominal pain x 1 week; Pt states it is achy and cramping pain at 9/10 on arrival. Pt states pain can be so bad it hurt to walk; pt a&ox 4 on arrival. Pt c/o nausea x 1 day; Pt states increased urgency and small amounts of urine-Monique,RN

## 2017-11-30 ENCOUNTER — Emergency Department (HOSPITAL_COMMUNITY): Payer: Commercial Managed Care - PPO

## 2018-01-24 ENCOUNTER — Other Ambulatory Visit: Payer: Self-pay

## 2018-01-24 ENCOUNTER — Emergency Department (HOSPITAL_COMMUNITY)
Admission: EM | Admit: 2018-01-24 | Discharge: 2018-01-24 | Disposition: A | Discharge status: Home / Self Care | Payer: Commercial Managed Care - PPO | Attending: Emergency Medicine | Admitting: Emergency Medicine

## 2018-01-24 DIAGNOSIS — Z79899 Other long term (current) drug therapy: Secondary | ICD-10-CM | POA: Diagnosis not present

## 2018-01-24 DIAGNOSIS — I10 Essential (primary) hypertension: Secondary | ICD-10-CM | POA: Diagnosis not present

## 2018-01-24 DIAGNOSIS — E119 Type 2 diabetes mellitus without complications: Secondary | ICD-10-CM | POA: Insufficient documentation

## 2018-01-24 DIAGNOSIS — Z7984 Long term (current) use of oral hypoglycemic drugs: Secondary | ICD-10-CM | POA: Insufficient documentation

## 2018-01-24 DIAGNOSIS — R0981 Nasal congestion: Secondary | ICD-10-CM | POA: Diagnosis present

## 2018-01-24 MED ORDER — FLUTICASONE PROPIONATE 50 MCG/ACT NA SUSP: 1.0000 | Freq: Every day | NASAL | 2 refills | Status: DC

## 2018-01-24 MED ORDER — PSEUDOEPHEDRINE HCL 30 MG PO TABS: 30.0000 mg | ORAL_TABLET | ORAL | 0 refills | Status: DC | PRN

## 2018-01-24 NOTE — Discharge Instructions (Signed)
Please take Flonase daily for allergies.  Take Sudafed for congestion every 4 hours as needed.  Please follow-up with your primary care doctor for follow-up if your symptoms are not improving in a week.

## 2018-01-24 NOTE — ED Triage Notes (Signed)
Nasal and sinus Congestion since Sunday.

## 2018-01-24 NOTE — ED Provider Notes (Signed)
[facility] EMERGENCY DEPARTMENT Provider Note   CSN: [id number] Arrival date & time: 01/24/18  5093     History   Chief Complaint Chief Complaint  Patient presents with  . Nasal Congestion    stuffiness in sinus areas    HPI Grace Gallagher is a 47 y.o. female.  HPI   Grace Gallagher is a 47 year old female with a history of allergic rhinitis, type 2 diabetes and hypertension who presents to the emergency department for evaluation of nasal congestion and rhinorrhea.  Patient states that her symptoms began about 4 days ago and have been persistent.  She has 2 daughters at home with similar symptoms.  Reports runny nose with yellow nasal congestion.  Reports blowing her nose around the clock.  Has taken Tylenol without relief of her symptoms.  Reports mild pain over bilateral frontal sinuses.  She denies fevers, chills, sore throat, cough, ear pain, body aches, shortness of breath, chest pain.  Past Medical History:  Diagnosis Date  . Anemia   . Diabetes mellitus without complication ([place])   . Hypertension   . Obesity     Patient Active Problem List   Diagnosis Date Noted  . Lower respiratory infection 07/07/2015  . Bacterial vaginosis 07/07/2015  . Edema 03/26/2015  . Anal itching 10/14/2014  . Head or neck swelling, mass, or lump 09/25/2014  . Vaginitis and vulvovaginitis 09/10/2014  . Dizziness and giddiness 06/02/2014  . Other malaise and fatigue 05/30/2014  . Acute sinusitis with symptoms > 10 days 04/09/2014  . Essential hypertension, malignant 02/04/2014  . SOB (shortness of breath) 02/04/2014  . Allergic rhinitis 01/29/2014  . Hypokalemia 01/10/2014  . Back pain 01/06/2014  . Bilateral lower abdominal cramping 08/29/2013  . Menorrhagia 08/29/2013  . Unspecified contraceptive management 07/01/2013  . Encounter for routine gynecological examination 07/01/2013  . Headache(784.0) 03/31/2013  . HTN (hypertension) 02/21/2013  . Obesity, unspecified  02/21/2013  . Microcytic anemia 02/21/2013    Past Surgical History:  Procedure Laterality Date  . BREAST SURGERY  08/2011   breast reduction  . FOOT SURGERY    . TUBAL LIGATION      OB History    No data available       Home Medications    Prior to Admission medications   Medication Sig Start Date End Date Taking? Authorizing Provider  albuterol (PROVENTIL HFA;VENTOLIN HFA) 108 (90 Base) MCG/ACT inhaler Inhale 1-2 puffs into the lungs every 4 (four) hours as needed for wheezing or shortness of breath. Patient not taking: Reported on 11/29/2017 01/17/17   Lantier, Delice Bison, DO  amLODipine (NORVASC) 10 MG tablet Take 1 tablet (10 mg total) by mouth daily. 09/10/14   Lucille Passy, MD  atorvastatin (LIPITOR) 20 MG tablet Take 20 mg by mouth daily. 10/09/17   [provider]  azithromycin (ZITHROMAX) 250 MG tablet Take 1 tablet (250 mg total) by mouth daily. Start this prescription on 01/18/17. Patient not taking: Reported on 11/29/2017 01/17/17   Yoo, Delice Bison, DO  benzonatate (TESSALON) 100 MG capsule Take 1 capsule (100 mg total) by mouth every 8 (eight) hours. Patient not taking: Reported on 11/29/2017 01/22/17   Jeannett Senior, PA-C  guaiFENesin-codeine 100-10 MG/5ML syrup Take 10 mLs by mouth every 6 (six) hours as needed for cough. Patient not taking: Reported on 11/29/2017 01/17/17   Sesay, Delice Bison, DO  hydrochlorothiazide (HYDRODIURIL) 25 MG tablet Take 1 tablet (25 mg total) by mouth daily. 07/07/15   Jaynee Eagles,  PA-C  ipratropium (ATROVENT HFA) 17 MCG/ACT inhaler Inhale 2 puffs into the lungs every 6 (six) hours as needed for wheezing. Patient not taking: Reported on 11/29/2017 07/07/15   Jaynee Eagles, PA-C  metFORMIN (GLUCOPHAGE) 1000 MG tablet Take 1,000 mg by mouth 2 (two) times daily with a meal.    [provider]  metoprolol succinate (TOPROL-XL) 50 MG 24 hr tablet Take 50 mg by mouth daily. Patient takes if she feels like blood pressure is high ( head  hurting) 06/01/16   [provider]  ondansetron (ZOFRAN ODT) 4 MG disintegrating tablet Take 1 tablet (4 mg total) by mouth every 8 (eight) hours as needed for nausea or vomiting. Patient not taking: Reported on 11/29/2017 01/17/17   Pilkington, Delice Bison, DO  oseltamivir (TAMIFLU) 75 MG capsule Take 1 capsule (75 mg total) by mouth every 12 (twelve) hours. Patient not taking: Reported on 11/29/2017 01/17/17   Marines, Delice Bison, DO  predniSONE (STERAPRED UNI-PAK 21 TAB) 10 MG (21) TBPK tablet Take 1 tablet (10 mg total) by mouth daily. Take 6 tabs by mouth daily  for 2 days, then 5 tabs for 2 days, then 4 tabs for 2 days, then 3 tabs for 2 days, 2 tabs for 2 days, then 1 tab by mouth daily for 2 days Patient not taking: Reported on 11/29/2017 01/22/17   Jeannett Senior, PA-C  ramipril (ALTACE) 10 MG capsule Take 10 mg by mouth daily. 10/10/17   [provider]    Family History Family History  Problem Relation Age of Onset  . Hypertension Mother   . Heart failure Mother   . Heart attack Mother   . Cancer Father     Social History Social History   Tobacco Use  . Smoking status: Never Smoker  . Smokeless tobacco: Never Used  Substance Use Topics  . Alcohol use: No  . Drug use: No     Allergies   Hydrocodone   Review of Systems Review of Systems  Constitutional: Negative for chills and fever.  HENT: Positive for congestion, rhinorrhea and sinus pain. Negative for ear pain, sore throat and trouble swallowing.   Respiratory: Negative for shortness of breath.   Cardiovascular: Negative for chest pain.  Neurological: Negative for headaches.     Physical Exam Updated Vital Signs BP 140/76 (BP Location: Right Arm)   Pulse 72   Temp 98.5 F (36.9 C) (Oral)   Resp 16   Ht 5\' 6"  (1.676 m)   Wt 119.3 kg (263 lb)   LMP 01/01/2018   SpO2 99%   BMI 42.45 kg/m   Physical Exam  Constitutional: She appears well-developed and well-nourished. No distress.  HENT:    Head: Normocephalic and atraumatic.  Bilateral allergic shiners present.  Pale and boggy nasal turbinates.  Clear rhinorrhea in bilateral nares.  No tenderness over the maxillary or frontal sinuses.  Mucous membranes moist.  No erythema over the posterior oropharynx.  No tonsillar swelling or exudate.  Uvula midline.  Eyes: Conjunctivae are normal. Pupils are equal, round, and reactive to light. Right eye exhibits no discharge. Left eye exhibits no discharge.  Cardiovascular: Normal rate, regular rhythm and intact distal pulses. Exam reveals no friction rub.  No murmur heard. Pulmonary/Chest: Effort normal. No respiratory distress.  Neurological: She is alert. Coordination normal.  Skin: She is not diaphoretic.  Psychiatric: She has a normal mood and affect. Her behavior is normal.  Nursing note and vitals reviewed.    ED Treatments /  Results  Labs (all labs ordered are listed, but only abnormal results are displayed) Labs Reviewed - No data to display  EKG  EKG Interpretation None       Radiology No results found.  Procedures Procedures (including critical care time)  Medications Ordered in ED Medications - No data to display   Initial Impression / Assessment and Plan / ED Course  I have reviewed the triage vital signs and the nursing notes.  Pertinent labs & imaging results that were available during my care of the patient were reviewed by me and considered in my medical decision making (see chart for details).     Patient complaining of symptoms of sinusitis.    Mild to moderate symptoms of clear/yellow nasal discharge/congestion for less than 10 days.  Patient is afebrile.  No concern for acute bacterial rhinosinusitis; likely viral in nature.  She also has allergic shiners and boggy nasal turbinates, allergies likely contributing. Will treat with sudafed and Flonase. Patient instructions given for warm saline nasal washes.  Recommendations for follow-up with primary  care physician should symptoms persist.    Final Clinical Impressions(s) / ED Diagnoses   Final diagnoses:  Nasal congestion    ED Discharge Orders        Ordered    pseudoephedrine (SUDAFED) 30 MG tablet  Every 4 hours PRN     01/24/18 1137    fluticasone (FLONASE) 50 MCG/ACT nasal spray  Daily     01/24/18 1137       Bernarda Caffey 01/24/18 1138    Gareth Morgan, MD 01/24/18 2144

## 2018-02-05 ENCOUNTER — Other Ambulatory Visit: Payer: Self-pay

## 2018-02-05 ENCOUNTER — Encounter (HOSPITAL_COMMUNITY): Payer: Self-pay

## 2018-02-05 ENCOUNTER — Emergency Department (HOSPITAL_COMMUNITY)
Admission: EM | Admit: 2018-02-05 | Discharge: 2018-02-06 | Disposition: A | Discharge status: Home / Self Care | Payer: Commercial Managed Care - PPO | Attending: Emergency Medicine | Admitting: Emergency Medicine

## 2018-02-05 ENCOUNTER — Emergency Department (HOSPITAL_COMMUNITY): Payer: Commercial Managed Care - PPO

## 2018-02-05 DIAGNOSIS — I10 Essential (primary) hypertension: Secondary | ICD-10-CM | POA: Diagnosis not present

## 2018-02-05 DIAGNOSIS — R05 Cough: Secondary | ICD-10-CM | POA: Insufficient documentation

## 2018-02-05 DIAGNOSIS — Z7984 Long term (current) use of oral hypoglycemic drugs: Secondary | ICD-10-CM | POA: Diagnosis not present

## 2018-02-05 DIAGNOSIS — Z79899 Other long term (current) drug therapy: Secondary | ICD-10-CM | POA: Insufficient documentation

## 2018-02-05 DIAGNOSIS — E119 Type 2 diabetes mellitus without complications: Secondary | ICD-10-CM | POA: Insufficient documentation

## 2018-02-05 DIAGNOSIS — R0981 Nasal congestion: Secondary | ICD-10-CM

## 2018-02-05 DIAGNOSIS — R059 Cough, unspecified: Secondary | ICD-10-CM

## 2018-02-05 DIAGNOSIS — J029 Acute pharyngitis, unspecified: Secondary | ICD-10-CM

## 2018-02-05 LAB — POC URINE PREG, ED: Preg Test, Ur: NEGATIVE

## 2018-02-05 MED ORDER — CETIRIZINE-PSEUDOEPHEDRINE ER 5-120 MG PO TB12: 1.0000 | ORAL_TABLET | Freq: Every day | ORAL | 0 refills | Status: AC

## 2018-02-05 MED ORDER — BENZONATATE 100 MG PO CAPS: 100.0000 mg | ORAL_CAPSULE | Freq: Three times a day (TID) | ORAL | 0 refills | Status: DC | PRN

## 2018-02-05 NOTE — ED Provider Notes (Signed)
[facility] EMERGENCY DEPARTMENT Provider Note   CSN: [id number] Arrival date & time: 02/05/18  2145     History   Chief Complaint Chief Complaint  Patient presents with  . Influenza    HPI Grace Gallagher is a 47 y.o. female with history of DM, HTN, obesity, and anemia presents today for evaluation of the onset, progressively worsening nasal congestion for 2 weeks and acute onset of sore throat and hoarse voice for 3 days.  Also notes cough productive of clear-yellow sputum.  She was seen and evaluated for nasal congestion on 01/24/18 and discharged with Sudafed and Flonase which she states have not been very helpful.  She denies sinus pressure or pain.  No fevers or chills.  2 days ago she developed sore throat and pain with swallowing as well as hoarse voice.  She denies drooling or facial swelling.  She is tolerating p.o. food and fluids without difficulty.  No chest pain or shortness of breath.  The history is provided by the patient.    Past Medical History:  Diagnosis Date  . Anemia   . Diabetes mellitus without complication ([place])   . Hypertension   . Obesity     Patient Active Problem List   Diagnosis Date Noted  . Lower respiratory infection 07/07/2015  . Bacterial vaginosis 07/07/2015  . Edema 03/26/2015  . Anal itching 10/14/2014  . Head or neck swelling, mass, or lump 09/25/2014  . Vaginitis and vulvovaginitis 09/10/2014  . Dizziness and giddiness 06/02/2014  . Other malaise and fatigue 05/30/2014  . Acute sinusitis with symptoms > 10 days 04/09/2014  . Essential hypertension, malignant 02/04/2014  . SOB (shortness of breath) 02/04/2014  . Allergic rhinitis 01/29/2014  . Hypokalemia 01/10/2014  . Back pain 01/06/2014  . Bilateral lower abdominal cramping 08/29/2013  . Menorrhagia 08/29/2013  . Unspecified contraceptive management 07/01/2013  . Encounter for routine gynecological examination 07/01/2013  . Headache(784.0) 03/31/2013  . HTN  (hypertension) 02/21/2013  . Obesity, unspecified 02/21/2013  . Microcytic anemia 02/21/2013    Past Surgical History:  Procedure Laterality Date  . BREAST SURGERY  08/2011   breast reduction  . FOOT SURGERY    . TUBAL LIGATION      OB History    No data available       Home Medications    Prior to Admission medications   Medication Sig Start Date End Date Taking? Authorizing Provider  albuterol (PROVENTIL HFA;VENTOLIN HFA) 108 (90 Base) MCG/ACT inhaler Inhale 1-2 puffs into the lungs every 4 (four) hours as needed for wheezing or shortness of breath. Patient not taking: Reported on 11/29/2017 01/17/17   Meinhardt, Delice Bison, DO  amLODipine (NORVASC) 10 MG tablet Take 1 tablet (10 mg total) by mouth daily. 09/10/14   Lucille Passy, MD  atorvastatin (LIPITOR) 20 MG tablet Take 20 mg by mouth daily. 10/09/17   [provider]  azithromycin (ZITHROMAX) 250 MG tablet Take 1 tablet (250 mg total) by mouth daily. Start this prescription on 01/18/17. Patient not taking: Reported on 11/29/2017 01/17/17   Encarnacion, Delice Bison, DO  benzonatate (TESSALON) 100 MG capsule Take 1 capsule (100 mg total) by mouth 3 (three) times daily as needed for cough. 02/05/18   Lilyann Gravelle A, PA-C  cetirizine-pseudoephedrine (ZYRTEC-D) 5-120 MG tablet Take 1 tablet by mouth daily for 10 days. 02/05/18 02/15/18  Rodell Perna A, PA-C  fluticasone (FLONASE) 50 MCG/ACT nasal spray Place 1 spray into both nostrils daily. 01/24/18  Glyn Ade, PA-C  guaiFENesin-codeine 100-10 MG/5ML syrup Take 10 mLs by mouth every 6 (six) hours as needed for cough. Patient not taking: Reported on 11/29/2017 01/17/17   Testerman, Delice Bison, DO  hydrochlorothiazide (HYDRODIURIL) 25 MG tablet Take 1 tablet (25 mg total) by mouth daily. 07/07/15   Jaynee Eagles, PA-C  ipratropium (ATROVENT HFA) 17 MCG/ACT inhaler Inhale 2 puffs into the lungs every 6 (six) hours as needed for wheezing. Patient not taking: Reported on 11/29/2017 07/07/15   Jaynee Eagles, PA-C  metFORMIN (GLUCOPHAGE) 1000 MG tablet Take 1,000 mg by mouth 2 (two) times daily with a meal.    [provider]  metoprolol succinate (TOPROL-XL) 50 MG 24 hr tablet Take 50 mg by mouth daily. Patient takes if she feels like blood pressure is high ( head hurting) 06/01/16   [provider]  ondansetron (ZOFRAN ODT) 4 MG disintegrating tablet Take 1 tablet (4 mg total) by mouth every 8 (eight) hours as needed for nausea or vomiting. Patient not taking: Reported on 11/29/2017 01/17/17   Cogdell, Delice Bison, DO  oseltamivir (TAMIFLU) 75 MG capsule Take 1 capsule (75 mg total) by mouth every 12 (twelve) hours. Patient not taking: Reported on 11/29/2017 01/17/17   Nall, Delice Bison, DO  predniSONE (STERAPRED UNI-PAK 21 TAB) 10 MG (21) TBPK tablet Take 1 tablet (10 mg total) by mouth daily. Take 6 tabs by mouth daily  for 2 days, then 5 tabs for 2 days, then 4 tabs for 2 days, then 3 tabs for 2 days, 2 tabs for 2 days, then 1 tab by mouth daily for 2 days Patient not taking: Reported on 11/29/2017 01/22/17   Jeannett Senior, PA-C  pseudoephedrine (SUDAFED) 30 MG tablet Take 1 tablet (30 mg total) by mouth every 4 (four) hours as needed for congestion. 01/24/18   Glyn Ade, PA-C  ramipril (ALTACE) 10 MG capsule Take 10 mg by mouth daily. 10/10/17   [provider]    Family History Family History  Problem Relation Age of Onset  . Hypertension Mother   . Heart failure Mother   . Heart attack Mother   . Cancer Father     Social History Social History   Tobacco Use  . Smoking status: Never Smoker  . Smokeless tobacco: Never Used  Substance Use Topics  . Alcohol use: No  . Drug use: No     Allergies   Hydrocodone   Review of Systems Review of Systems  Constitutional: Negative for chills and fever.  HENT: Positive for congestion and sore throat. Negative for sinus pressure and sinus pain.   Respiratory: Positive for cough. Negative for shortness  of breath.   Cardiovascular: Negative for chest pain.     Physical Exam Updated Vital Signs BP 119/61   Pulse 79   Temp 98.7 F (37.1 C) (Oral)   Resp 20   Ht 5\' 6"  (1.676 m)   Wt 119.3 kg (263 lb)   LMP 02/01/2018 (Exact Date)   SpO2 99%   BMI 42.45 kg/m   Physical Exam  Constitutional: She appears well-developed and well-nourished. No distress.  HENT:  Head: Normocephalic and atraumatic.  TMs with middle ear effusion bilaterally.  No erythema or bulging.  Nasal septum midline, mucosal edema bilaterally noted, worse on the right.  No frontal or maxillary sinus tenderness.  Posterior oropharynx with tonsillar hypertrophy and erythema but no exudates or uvular deviation.  No trismus or sublingual abnormalities.  Postnasal drip noted.  Speaking with a soft hoarse voice  Eyes: Conjunctivae are normal. Right eye exhibits no discharge. Left eye exhibits no discharge.  Neck: Normal range of motion. Neck supple. No JVD present. No tracheal deviation present.  Cardiovascular: Normal rate, regular rhythm and normal heart sounds.  Pulmonary/Chest: Effort normal and breath sounds normal. No stridor. No respiratory distress. She has no wheezes. She has no rales. She exhibits no tenderness.  Abdominal: She exhibits no distension.  Musculoskeletal: She exhibits no edema.  Neurological: She is alert.  Skin: Skin is warm and dry. No erythema.  Psychiatric: She has a normal mood and affect. Her behavior is normal.  Nursing note and vitals reviewed.    ED Treatments / Results  Labs (all labs ordered are listed, but only abnormal results are displayed) Labs Reviewed  POC URINE PREG, ED    EKG  EKG Interpretation None       Radiology Dg Chest 2 View  Result Date: 02/05/2018 CLINICAL DATA:  Productive cough with greenish sputum hoarse voice. Headache x3 weeks. EXAM: CHEST  2 VIEW COMPARISON:  01/22/2017 FINDINGS: The heart size and mediastinal contours are within normal limits. Lung  volumes are slightly low with crowding of interstitial markings. Minimal subsegmental atelectasis is noted the left base. No pneumonic consolidation, effusion or pneumothorax. The visualized skeletal structures are unremarkable. IMPRESSION: Slightly low lung volumes with subsegmental atelectasis at the left base. No acute pneumonic consolidation. Electronically Signed   By: Ashley Royalty M.D.   On: 02/05/2018 22:30    Procedures Procedures (including critical care time)  Medications Ordered in ED Medications - No data to display   Initial Impression / Assessment and Plan / ED Course  I have reviewed the triage vital signs and the nursing notes.  Pertinent labs & imaging results that were available during my care of the patient were reviewed by me and considered in my medical decision making (see chart for details).     Patient presents with nasal congestion for 2 weeks and acute onset of sore throat and hoarse voice.  Afebrile, vital signs are stable.  She is nontoxic in appearance.  Presentation consistent with viral pharyngitis with associated URI symptoms.  Chest x-ray shows no evidence of consolidation or pleural effusion.  Lungs are clear to auscultation bilaterally.  No chest pain.  She is tolerating secretions without difficulty.  Presentation is not concerning for strep pharyngitis, PTA, or spread of infection to soft tissue.  Discussed with patient that antibiotics are not indicated.  Will discharge with symptomatic treatment.  Recommend follow-up with primary care physician for reevaluation of symptoms.  Discussed indications for return to the ED. Pt verbalized understanding of and agreement with plan and is safe for discharge home at this time.  She has no complaints prior to discharge.  Final Clinical Impressions(s) / ED Diagnoses   Final diagnoses:  Viral pharyngitis  Nasal congestion  Cough    ED Discharge Orders        Ordered    benzonatate (TESSALON) 100 MG capsule  3  times daily PRN     02/05/18 2350    cetirizine-pseudoephedrine (ZYRTEC-D) 5-120 MG tablet  Daily     02/05/18 2350       Renita Papa, PA-C 02/06/18 0141    Merryl Hacker, MD 02/06/18 [phone]

## 2018-02-05 NOTE — Discharge Instructions (Signed)
Drink plenty of water and get plenty of rest.  Gargle warm salt water and spit it out for sore throat. May also use cough drops, warm teas, etc. take Tessalon as needed for cough.  Zyrtec for nasal congestion and scratchy throat. Alternate 600 mg of ibuprofen and [phone] mg of Tylenol every 3 hours as needed for pain. Do not exceed 4000 mg of Tylenol daily.   Followup with your primary care doctor in 5-7 days for recheck of ongoing symptoms. Return to emergency department for emergent changing or worsening of symptoms such as throat tightness, facial swelling, fever not controlled by ibuprofen or Tylenol,difficulty breathing, or chest pain.

## 2018-02-05 NOTE — ED Triage Notes (Signed)
Pt endorses cold sx, productive cough with green sputum and hoarse voice.

## 2018-03-10 ENCOUNTER — Encounter (HOSPITAL_COMMUNITY): Payer: Self-pay | Admitting: *Deleted

## 2018-03-10 ENCOUNTER — Inpatient Hospital Stay (HOSPITAL_COMMUNITY)
Admission: AD | Admit: 2018-03-10 | Discharge: 2018-03-10 | Disposition: A | Discharge status: Home / Self Care | Payer: Commercial Managed Care - PPO | Source: Ambulatory Visit | Attending: Obstetrics and Gynecology | Admitting: Obstetrics and Gynecology

## 2018-03-10 DIAGNOSIS — N938 Other specified abnormal uterine and vaginal bleeding: Secondary | ICD-10-CM | POA: Diagnosis not present

## 2018-03-10 DIAGNOSIS — N939 Abnormal uterine and vaginal bleeding, unspecified: Secondary | ICD-10-CM | POA: Diagnosis present

## 2018-03-10 DIAGNOSIS — I10 Essential (primary) hypertension: Secondary | ICD-10-CM | POA: Diagnosis not present

## 2018-03-10 DIAGNOSIS — D62 Acute posthemorrhagic anemia: Secondary | ICD-10-CM

## 2018-03-10 DIAGNOSIS — Z6841 Body Mass Index (BMI) 40.0 and over, adult: Secondary | ICD-10-CM | POA: Diagnosis not present

## 2018-03-10 DIAGNOSIS — N393 Stress incontinence (female) (male): Secondary | ICD-10-CM

## 2018-03-10 LAB — CBC WITH DIFFERENTIAL/PLATELET
Basophils Absolute: 0 10*3/uL (ref 0.0–0.1)
Basophils Relative: 0 %
EOS ABS: 0.1 10*3/uL (ref 0.0–0.7)
EOS PCT: 2 %
HCT: 31.6 % — ABNORMAL LOW (ref 36.0–46.0)
Hemoglobin: 9.9 g/dL — ABNORMAL LOW (ref 12.0–15.0)
LYMPHS ABS: 1.9 10*3/uL (ref 0.7–4.0)
LYMPHS PCT: 32 %
MCH: 22.2 pg — AB (ref 26.0–34.0)
MCHC: 31.3 g/dL (ref 30.0–36.0)
MCV: 71 fL — AB (ref 78.0–100.0)
MONO ABS: 0.3 10*3/uL (ref 0.1–1.0)
MONOS PCT: 4 %
Neutro Abs: 3.8 10*3/uL (ref 1.7–7.7)
Neutrophils Relative %: 62 %
PLATELETS: 204 10*3/uL (ref 150–400)
RBC: 4.45 MIL/uL (ref 3.87–5.11)
RDW: 16.8 % — AB (ref 11.5–15.5)
WBC: 6.1 10*3/uL (ref 4.0–10.5)

## 2018-03-10 LAB — URINALYSIS, ROUTINE W REFLEX MICROSCOPIC: Bacteria, UA: NONE SEEN

## 2018-03-10 LAB — WET PREP, GENITAL
Clue Cells Wet Prep HPF POC: NONE SEEN
Sperm: NONE SEEN
Trich, Wet Prep: NONE SEEN
WBC WET PREP: NONE SEEN
YEAST WET PREP: NONE SEEN

## 2018-03-10 LAB — POCT PREGNANCY, URINE: Preg Test, Ur: NEGATIVE

## 2018-03-10 MED ORDER — FERROUS SULFATE 325 (65 FE) MG PO TABS: 325.0000 mg | ORAL_TABLET | Freq: Every day | ORAL | 0 refills | Status: DC

## 2018-03-10 MED ORDER — MEGESTROL ACETATE 40 MG PO TABS: 40.0000 mg | ORAL_TABLET | Freq: Three times a day (TID) | ORAL | 0 refills | Status: DC

## 2018-03-10 NOTE — Discharge Instructions (Signed)
Dysfunctional Uterine Bleeding °Dysfunctional uterine bleeding is abnormal bleeding from the uterus. Dysfunctional uterine bleeding includes: °· A period that comes earlier or later than usual. °· A period that is lighter, heavier, or has blood clots. °· Bleeding between periods. °· Skipping one or more periods. °· Bleeding after sexual intercourse. °· Bleeding after menopause. ° °Follow these instructions at home: °Pay attention to any changes in your symptoms. Follow these instructions to help with your condition: °Eating and drinking °· Eat well-balanced meals. Include foods that are high in iron, such as liver, meat, shellfish, green leafy vegetables, and eggs. °· If you become constipated: °? Drink plenty of water. °? Eat fruits and vegetables that are high in water and fiber, such as spinach, carrots, raspberries, apples, and mango. °Medicines °· Take over-the-counter and prescription medicines only as told by your health care provider. °· Do not change medicines without talking with your health care provider. °· Aspirin or medicines that contain aspirin may make the bleeding worse. Do not take those medicines: °? During the week before your period. °? During your period. °· If you were prescribed iron pills, take them as told by your health care provider. Iron pills help to replace iron that your body loses because of this condition. °Activity °· If you need to change your sanitary pad or tampon more than one time every 2 hours: °? Lie in bed with your feet raised (elevated). °? Place a cold pack on your lower abdomen. °? Rest as much as possible until the bleeding stops or slows down. °· Do not try to lose weight until the bleeding has stopped and your blood iron level is back to normal. °Other Instructions °· For two months, write down: °? When your period starts. °? When your period ends. °? When any abnormal bleeding occurs. °? What problems you notice. °· Keep all follow up visits as told by your health  care provider. This is important. °Contact a health care provider if: °· You get light-headed or weak. °· You have nausea and vomiting. °· You cannot eat or drink without vomiting. °· You feel dizzy or have diarrhea while you are taking medicines. °· You are taking birth control pills or hormones, and you want to change them or stop taking them. °Get help right away if: °· You develop a fever or chills. °· You need to change your sanitary pad or tampon more than one time per hour. °· Your bleeding becomes heavier, or your flow contains clots more often. °· You develop pain in your abdomen. °· You lose consciousness. °· You develop a rash. °This information is not intended to replace advice given to you by your health care provider. Make sure you discuss any questions you have with your health care provider. °Document Released: 11/18/2000 Document Revised: 04/28/2016 Document Reviewed: 02/16/2015 °Elsevier Interactive Patient Education © 2018 [employer] ° °

## 2018-03-10 NOTE — MAU Provider Note (Signed)
History   47 yo female in with bleeding since 02/21/18 bleeding has been heavy with clots. Pt became concerned and came in to be checked. Says was seen by primary care provider yesterday and placed on birth control pills and told if she did no improve or if worsens to be seen .  CSN: [id number]  Arrival date & time 03/10/18  1831   None     Chief Complaint  Patient presents with  . Abdominal Pain  . Vaginal Bleeding    HPI  Past Medical History:  Diagnosis Date  . Anemia   . Diabetes mellitus without complication ([place])   . Hypertension   . Obesity     Past Surgical History:  Procedure Laterality Date  . BREAST SURGERY  08/2011   breast reduction  . FOOT SURGERY    . TUBAL LIGATION      Family History  Problem Relation Age of Onset  . Hypertension Mother   . Heart failure Mother   . Heart attack Mother   . Cancer Father     Social History   Tobacco Use  . Smoking status: Never Smoker  . Smokeless tobacco: Never Used  Substance Use Topics  . Alcohol use: No  . Drug use: No    OB History    Gravida  2   Para  2   Term  0   Preterm  2   AB      Living  2     SAB      TAB      Ectopic      Multiple      Live Births  2           Review of Systems  Constitutional: Negative.   HENT: Negative.   Eyes: Negative.   Respiratory: Negative.   Cardiovascular: Negative.   Gastrointestinal: Positive for abdominal pain.  Endocrine: Negative.   Genitourinary: Positive for vaginal bleeding.  Musculoskeletal: Negative.   Skin: Negative.   Allergic/Immunologic: Negative.   Neurological: Negative.   Hematological: Negative.   Psychiatric/Behavioral: Negative.     Allergies  Hydrocodone  Home Medications    BP (!) 159/88 (BP Location: Right Arm)   Pulse 79   Temp 98.7 F (37.1 C) (Oral)   Resp 18   Ht 5\' 6"  (1.676 m)   Wt 275 lb (124.7 kg)   SpO2 97%   BMI 44.39 kg/m   Physical Exam  Constitutional: She is oriented to person,  place, and time. She appears well-developed and well-nourished.  HENT:  Head: Normocephalic.  Eyes: Pupils are equal, round, and reactive to light.  Neck: Normal range of motion.  Cardiovascular: Normal rate, regular rhythm, normal heart sounds and intact distal pulses.  Pulmonary/Chest: Effort normal and breath sounds normal.  Abdominal: Soft. Bowel sounds are normal.  Genitourinary:  Genitourinary Comments: Unable to assess due to body habitus.  Musculoskeletal: Normal range of motion.  Neurological: She is alert and oriented to person, place, and time. She has normal reflexes.  Skin: Skin is warm and dry.  Psychiatric: She has a normal mood and affect. Her behavior is normal. Judgment and thought content normal.    MAU Course  Procedures (including critical care time)  Labs Reviewed  WET PREP, GENITAL  URINALYSIS, ROUTINE W REFLEX MICROSCOPIC  CBC WITH DIFFERENTIAL/PLATELET  POCT PREGNANCY, URINE   No results found.   1. DUB (dysfunctional uterine bleeding)   2. Chronic hypertension   3. Morbid obesity ([place])  MDM  BP 159/88, scant amt vag bleeding with exam. Loss of urine noted with cough. Stop OCPs secondary to increased risk. CBC . Will start on megace 40 TID. Pt to f/u Monday with OB GYN for further assessment. Will d/c home

## 2018-03-10 NOTE — MAU Note (Signed)
Pt reports she has had vaginal bleeding since 03/20, seen by her gyn yesterday and had a pelvic and was prescribed BCP's and was told if she got any worse to come to the emergency room. Pt states she has been having lower abd pain since the bleeding started but has worsened over the last week.

## 2018-03-12 ENCOUNTER — Encounter (HOSPITAL_COMMUNITY): Payer: Self-pay | Admitting: Emergency Medicine

## 2018-03-12 ENCOUNTER — Other Ambulatory Visit: Payer: Self-pay

## 2018-03-12 ENCOUNTER — Emergency Department (HOSPITAL_COMMUNITY)
Admission: EM | Admit: 2018-03-12 | Discharge: 2018-03-13 | Disposition: A | Discharge status: Home / Self Care | Payer: Commercial Managed Care - PPO | Attending: Emergency Medicine | Admitting: Emergency Medicine

## 2018-03-12 DIAGNOSIS — N938 Other specified abnormal uterine and vaginal bleeding: Secondary | ICD-10-CM | POA: Diagnosis present

## 2018-03-12 DIAGNOSIS — Z5321 Procedure and treatment not carried out due to patient leaving prior to being seen by health care provider: Secondary | ICD-10-CM | POA: Insufficient documentation

## 2018-03-12 NOTE — ED Triage Notes (Signed)
Pt c/o vaginal bleeding for 20Xdays.  Pt went to her provider, was given medication, stated if it continues to come to the ED.  Pt c/o 8/10 cramping.

## 2018-03-13 LAB — URINALYSIS, ROUTINE W REFLEX MICROSCOPIC
Bilirubin Urine: NEGATIVE
GLUCOSE, UA: NEGATIVE mg/dL
Ketones, ur: NEGATIVE mg/dL
Leukocytes, UA: NEGATIVE
Nitrite: NEGATIVE
PH: 6 (ref 5.0–8.0)
Protein, ur: 100 mg/dL — AB
SPECIFIC GRAVITY, URINE: 1.029 (ref 1.005–1.030)

## 2018-03-13 LAB — POC URINE PREG, ED: Preg Test, Ur: NEGATIVE

## 2018-03-14 ENCOUNTER — Ambulatory Visit (INDEPENDENT_AMBULATORY_CARE_PROVIDER_SITE_OTHER): Payer: Commercial Managed Care - PPO | Admitting: Obstetrics & Gynecology

## 2018-03-14 ENCOUNTER — Encounter: Payer: Self-pay | Admitting: Obstetrics & Gynecology

## 2018-03-14 VITALS — BP 150/100 | HR 77 | Ht 66.0 in | Wt 222.0 lb

## 2018-03-14 DIAGNOSIS — N920 Excessive and frequent menstruation with regular cycle: Secondary | ICD-10-CM | POA: Diagnosis not present

## 2018-03-14 MED ORDER — MEDROXYPROGESTERONE ACETATE 10 MG PO TABS: 20.0000 mg | ORAL_TABLET | Freq: Every day | ORAL | 1 refills | Status: DC

## 2018-03-14 NOTE — Progress Notes (Signed)
HPI:      [name] is a 47 y.o. [phone] who LMP was Patient's last menstrual period was 02/21/2018., presents today for a problem visit.  She complains of menorrhagia that  began several weeks ago and its severity is described as moderate.  She has regular periods every 28 days and they are associated with mild menstrual cramping.  Then she began period 02/21/18 and it has not stopped, assoc w more cramping than normal.  She has used the following for attempts at control: maxi pad.  Previous evaluation: ER 6 days ago. Prior Diagnosis: abnormal bleeding on hormone replacement therapy.  No [place] or other testing done. Previous Treatment: Megace, did not tolerate.  PMHx: She  has a past medical history of Anemia, Diabetes mellitus without complication ([place]), Hypertension, and Obesity. Also,  has a past surgical history that includes Foot surgery; Tubal ligation; and Breast surgery (08/2011)., family history includes Cancer in her father; Heart attack in her mother; Heart failure in her mother; Hypertension in her mother.,  reports that she has never smoked. She has never used smokeless tobacco. She reports that she does not drink alcohol or use drugs.  She  Current Outpatient Medications:  .  amLODipine (NORVASC) 10 MG tablet, Take 1 tablet (10 mg total) by mouth daily., Disp: 30 tablet, Rfl: 6 .  hydrochlorothiazide (HYDRODIURIL) 25 MG tablet, Take 1 tablet (25 mg total) by mouth daily., Disp: 90 tablet, Rfl: 3 .  metFORMIN (GLUCOPHAGE) 1000 MG tablet, Take 1,000 mg by mouth 2 (two) times daily with a meal., Disp: , Rfl:  .  metoprolol succinate (TOPROL-XL) 50 MG 24 hr tablet, Take 50 mg by mouth daily. Patient takes if she feels like blood pressure is high ( head hurting), Disp: , Rfl: 5 .  ramipril (ALTACE) 10 MG capsule, Take 10 mg by mouth daily., Disp: , Rfl:  .  atorvastatin (LIPITOR) 20 MG tablet, Take 20 mg by mouth daily., Disp: , Rfl: 5 .  benzonatate (TESSALON) 100 MG capsule, Take  1 capsule (100 mg total) by mouth 3 (three) times daily as needed for cough. (Patient not taking: Reported on 03/14/2018), Disp: 21 capsule, Rfl: 0 .  ferrous sulfate 325 (65 FE) MG tablet, Take 1 tablet (325 mg total) by mouth daily. (Patient not taking: Reported on 03/14/2018), Disp: 30 tablet, Rfl: 0 .  fluticasone (FLONASE) 50 MCG/ACT nasal spray, Place 1 spray into both nostrils daily. (Patient not taking: Reported on 03/14/2018), Disp: 16 g, Rfl: 2 .  medroxyPROGESTERone (PROVERA) 10 MG tablet, Take 2 tablets (20 mg total) by mouth daily for 10 days., Disp: 20 tablet, Rfl: 1 .  ondansetron (ZOFRAN ODT) 4 MG disintegrating tablet, Take 1 tablet (4 mg total) by mouth every 8 (eight) hours as needed for nausea or vomiting. (Patient not taking: Reported on 11/29/2017), Disp: 20 tablet, Rfl: 0  Also, is allergic to hydrocodone.  Review of Systems  Constitutional: Positive for malaise/fatigue. Negative for chills and fever.  HENT: Negative for congestion, sinus pain and sore throat.   Eyes: Negative for blurred vision and pain.  Respiratory: Positive for cough, shortness of breath and wheezing.   Cardiovascular: Negative for chest pain and leg swelling.  Gastrointestinal: Negative for abdominal pain, constipation, diarrhea, heartburn, nausea and vomiting.  Genitourinary: Positive for frequency. Negative for dysuria, hematuria and urgency.  Musculoskeletal: Negative for back pain, joint pain, myalgias and neck pain.  Skin: Negative for itching and rash.  Neurological: Negative for dizziness, tremors  and weakness.  Endo/Heme/Allergies: Does not bruise/bleed easily.  Psychiatric/Behavioral: Positive for depression. The patient is not nervous/anxious and does not have insomnia.     Objective: BP (!) 150/100   Pulse 77   Ht 5\' 6"  (1.676 m)   Wt 222 lb (100.7 kg)   LMP 02/21/2018   BMI 35.83 kg/m  Physical Exam  Constitutional: She is oriented to person, place, and time. She appears  well-developed and well-nourished. No distress.  Genitourinary: Rectum normal, vagina normal and uterus normal. Pelvic exam was performed with patient supine. There is no rash or lesion on the right labia. There is no rash or lesion on the left labia. Vagina exhibits no lesion. No bleeding in the vagina. Right adnexum does not display mass and does not display tenderness. Left adnexum does not display mass and does not display tenderness. Cervix does not exhibit motion tenderness, lesion, friability or polyp.   Uterus is mobile and midaxial. Uterus is not enlarged or exhibiting a mass.  HENT:  Head: Normocephalic and atraumatic. Head is without laceration.  Right Ear: Hearing normal.  Left Ear: Hearing normal.  Nose: No epistaxis.  No foreign bodies.  Mouth/Throat: Uvula is midline, oropharynx is clear and moist and mucous membranes are normal.  Eyes: Pupils are equal, round, and reactive to light.  Neck: Normal range of motion. Neck supple. No thyromegaly present.  Cardiovascular: Normal rate and regular rhythm. Exam reveals no gallop and no friction rub.  No murmur heard. Pulmonary/Chest: Effort normal and breath sounds normal. No respiratory distress. She has no wheezes. Right breast exhibits no mass, no skin change and no tenderness. Left breast exhibits no mass, no skin change and no tenderness.  Abdominal: Soft. Bowel sounds are normal. She exhibits no distension. There is no tenderness. There is no rebound.  Musculoskeletal: Normal range of motion.  Neurological: She is alert and oriented to person, place, and time. No cranial nerve deficit.  Skin: Skin is warm and dry.  Psychiatric: She has a normal mood and affect. Judgment normal.  Vitals reviewed.  ASSESSMENT/PLAN:  dysfunctional uterine bleeding and possible polyp/fibroid - new unstable problem  Problem List Items Addressed This Visit      Other   Menorrhagia - Primary   Relevant Orders   US PELVIS TRANSVANGINAL NON-OB (TV  ONLY)    Provera to help control current bleeding HTN, poorly controlled, limitations in meds due to this Monitor pattern if determined normal anatomy Options for tx discussed if persists, or if fibroids  Barnett Applebaum, MD, [facility], [facility] 03/14/2018  9:19 AM

## 2018-03-14 NOTE — Patient Instructions (Signed)
Medroxyprogesterone tablets Take 2 pills daily for 10 days Ultrasound soon to assess uterus, ovaries  What is this medicine? MEDROXYPROGESTERONE (me DROX ee proe JES te rone) is a hormone in a class called progestins. It is commonly used to prevent the uterine lining from overgrowth in women taking an estrogen after menopause. It is also used to treat irregular menstrual bleeding or a lack of menstrual bleeding in women. This medicine may be used for other purposes; ask your health care provider or pharmacist if you have questions. COMMON BRAND NAME(S): Amen, Provera What should I tell my health care provider before I take this medicine? They need to know if you have any of these conditions: -blood vessel disease or a history of a blood clot in the lungs or legs -breast, cervical or vaginal cancer -heart disease -kidney disease -liver disease -migraine -recent miscarriage or abortion -mental depression -migraine -seizures (convulsions) -stroke -vaginal bleeding that has not been evaluated -an unusual or allergic reaction to medroxyprogesterone, other medicines, foods, dyes, or preservatives -pregnant or trying to get pregnant -breast-feeding How should I use this medicine? Take this medicine by mouth with a glass of water. Follow the directions on the prescription label. Take your doses at regular intervals. Do not take your medicine more often than directed. Talk to your pediatrician regarding the use of this medicine in children. Special care may be needed. While this drug may be prescribed for children as young as 13 years for selected conditions, precautions do apply. Overdosage: If you think you have taken too much of this medicine contact a poison control center or emergency room at once. NOTE: This medicine is only for you. Do not share this medicine with others. What if I miss a dose? If you miss a dose, take it as soon as you can. If it is almost time for your next dose, take  only that dose. Do not take double or extra doses. What may interact with this medicine? -barbiturate medicines for inducing sleep or treating seizures (convulsions) -bosentan -carbamazepine -phenytoin -rifampin -St. John's Wort This list may not describe all possible interactions. Give your health care provider a list of all the medicines, herbs, non-prescription drugs, or dietary supplements you use. Also tell them if you smoke, drink alcohol, or use illegal drugs. Some items may interact with your medicine. What should I watch for while using this medicine? Visit your health care professional for regular checks on your progress. You will need a regular breast and pelvic exam. If you have any reason to think you are pregnant, stop taking this medicine at once and contact your doctor or health care professional. What side effects may I notice from receiving this medicine? Side effects that you should report to your doctor or health care professional as soon as possible: -breast tenderness or discharge -changes in mood or emotions, such as depression -changes in vision or speech -pain in the abdomen, chest, groin, or leg -severe headache -skin rash, itching, or hives -sudden shortness of breath -unusually weak or tired -yellowing of skin or eyes Side effects that usually do not require medical attention (report to your doctor or health care professional if they continue or are bothersome): -acne -change in menstrual bleeding pattern or flow -changes in sexual desire -facial hair growth -fluid retention and swelling -headache -upset stomach -weight gain or loss This list may not describe all possible side effects. Call your doctor for medical advice about side effects. You may report side effects to FDA at  1-800-FDA-1088. Where should I keep my medicine? Keep out of the reach of children. Store at room temperature between 20 and 25 degrees C (68 and 77 degrees F). Throw away any  unused medicine after the expiration date. NOTE: This sheet is a summary. It may not cover all possible information. If you have questions about this medicine, talk to your doctor, pharmacist, or health care provider.  2018 Elsevier/Gold Standard (2008-11-20 11:26:12)

## 2018-03-19 ENCOUNTER — Ambulatory Visit (INDEPENDENT_AMBULATORY_CARE_PROVIDER_SITE_OTHER): Payer: Commercial Managed Care - PPO | Admitting: Obstetrics & Gynecology

## 2018-03-19 ENCOUNTER — Encounter: Payer: Self-pay | Admitting: Obstetrics & Gynecology

## 2018-03-19 ENCOUNTER — Ambulatory Visit (INDEPENDENT_AMBULATORY_CARE_PROVIDER_SITE_OTHER): Payer: Commercial Managed Care - PPO

## 2018-03-19 VITALS — BP 150/100 | Ht 66.0 in | Wt 222.0 lb

## 2018-03-19 DIAGNOSIS — N920 Excessive and frequent menstruation with regular cycle: Secondary | ICD-10-CM

## 2018-03-19 DIAGNOSIS — D219 Benign neoplasm of connective and other soft tissue, unspecified: Secondary | ICD-10-CM

## 2018-03-19 NOTE — Patient Instructions (Signed)
Uterine Fibroids Uterine fibroids are tissue masses (tumors) that can develop in the womb (uterus). They are also called leiomyomas. This type of tumor is not cancerous (benign) and does not spread to other parts of the body outside of the pelvic area, which is between the hip bones. Occasionally, fibroids may develop in the fallopian tubes, in the cervix, or on the support structures (ligaments) that surround the uterus. You can have one or many fibroids. Fibroids can vary in size, weight, and where they grow in the uterus. Some can become quite large. Most fibroids do not require medical treatment. What are the causes? A fibroid can develop when a single uterine cell keeps growing (replicating). Most cells in the human body have a control mechanism that keeps them from replicating without control. What are the signs or symptoms? Symptoms may include:  Heavy bleeding during your period.  Bleeding or spotting between periods.  Pelvic pain and pressure.  Bladder problems, such as needing to urinate more often (urinary frequency) or urgently.  Inability to reproduce offspring (infertility).  Miscarriages.  How is this diagnosed? Uterine fibroids are diagnosed through a physical exam. Your health care provider may feel the lumpy tumors during a pelvic exam. Ultrasonography and an MRI may be done to determine the size, location, and number of fibroids. How is this treated? Treatment may include:  Watchful waiting. This involves getting the fibroid checked by your health care provider to see if it grows or shrinks. Follow your health care provider's recommendations for how often to have this checked.  Hormone medicines. These can be taken by mouth or given through an intrauterine device (IUD).  Surgery. ? Removing the fibroids (myomectomy) or the uterus (hysterectomy). ? Removing blood supply to the fibroids (uterine artery embolization).  If fibroids interfere with your fertility and you  want to become pregnant, your health care provider may recommend having the fibroids removed. Follow these instructions at home:  Keep all follow-up visits as directed by your health care provider. This is important.  Take over-the-counter and prescription medicines only as told by your health care provider. ? If you were prescribed a hormone treatment, take the hormone medicines exactly as directed.  Ask your health care provider about taking iron pills and increasing the amount of dark green, leafy vegetables in your diet. These actions can help to boost your blood iron levels, which may be affected by heavy menstrual bleeding.  Pay close attention to your period and tell your health care provider about any changes, such as: ? Increased blood flow that requires you to use more pads or tampons than usual per month. ? A change in the number of days that your period lasts per month. ? A change in symptoms that are associated with your period, such as abdominal cramping or back pain. Contact a health care provider if:  You have pelvic pain, back pain, or abdominal cramps that cannot be controlled with medicines.  You have an increase in bleeding between and during periods.  You soak tampons or pads in a half hour or less.  You feel lightheaded, extra tired, or weak. Get help right away if:  You faint.  You have a sudden increase in pelvic pain.  Total Laparoscopic Hysterectomy A total laparoscopic hysterectomy is a minimally invasive surgery to remove your uterus and cervix. This surgery is performed by making several small cuts (incisions) in your abdomen. It can also be done with a thin, lighted tube (laparoscope) inserted into two small  incisions in your lower abdomen. Your fallopian tubes and ovaries can be removed (bilateral salpingo-oophorectomy) during this surgery as well.Benefits of minimally invasive surgery include:  Less pain.  Less risk of blood loss.  Less risk of  infection.  Quicker return to normal activities.  Tell a health care provider about:  Any allergies you have.  All medicines you are taking, including vitamins, herbs, eye drops, creams, and over-the-counter medicines.  Any problems you or family members have had with anesthetic medicines.  Any blood disorders you have.  Any surgeries you have had.  Any medical conditions you have. What are the risks? Generally, this is a safe procedure. However, as with any procedure, complications can occur. Possible complications include:  Bleeding.  Blood clots in the legs or lung.  Infection.  Injury to surrounding organs.  Problems with anesthesia.  Early menopause symptoms (hot flashes, night sweats, insomnia).  Risk of conversion to an open abdominal incision.  What happens before the procedure?  Ask your health care provider about changing or stopping your regular medicines.  Do not take aspirin or blood thinners (anticoagulants) for 1 week before the surgery or as told by your health care provider.  Do not eat or drink anything for 8 hours before the surgery or as told by your health care provider.  Quit smoking if you smoke.  Arrange for a ride home after surgery and for someone to help you at home during recovery. What happens during the procedure?  You will be given antibiotic medicine.  An IV tube will be placed in your arm. You will be given medicine to make you sleep (general anesthetic).  A gas (carbon dioxide) will be used to inflate your abdomen. This will allow your surgeon to look inside your abdomen, perform your surgery, and treat any other problems found if necessary.  Three or four small incisions (often less than 1/2 inch) will be made in your abdomen. One of these incisions will be made in the area of your belly button (navel). The laparoscope will be inserted into the incision. Your surgeon will look through the laparoscope while doing your  procedure.  Other surgical instruments will be inserted through the other incisions.  Your uterus may be removed through your vagina or cut into small pieces and removed through the small incisions.  Your incisions will be closed. What happens after the procedure?  The gas will be released from inside your abdomen.  You will be taken to the recovery area where a nurse will watch and check your progress. Once you are awake, stable, and taking fluids well, without other problems, you will return to your room or be allowed to go home.  There is usually minimal discomfort following the surgery because the incisions are so small.  You will be given pain medicine while you are in the hospital and for when you go home. This information is not intended to replace advice given to you by your health care provider. Make sure you discuss any questions you have with your health care provider. Document Released: 09/18/2007 Document Revised: 04/28/2016 Document Reviewed: 06/11/2013 Elsevier Interactive Patient Education  2017 [employer].

## 2018-03-19 NOTE — Progress Notes (Signed)
  HPI: Uterine Fibroids Patient presents with uterine fibroids. Periods are usu reg but recent one was prolonged and heavy, lasting >20 days. Dysmenorrhea:moderate, occurring throughout menses. Cyclic symptoms include none. No intermenstrual bleeding, spotting, or discharge.  Provera has helped w bleeding.  Ultrasound demonstrates 3 fibroids, one is submucosal These findings are Pelvis abnormal FIBROIDS  PMHx: She  has a past medical history of Anemia, Diabetes mellitus without complication ([place]), Hypertension, and Obesity. Also,  has a past surgical history that includes Foot surgery; Tubal ligation; and Breast surgery (08/2011)., family history includes Cancer in her father; Heart attack in her mother; Heart failure in her mother; Hypertension in her mother.,  reports that she has never smoked. She has never used smokeless tobacco. She reports that she does not drink alcohol or use drugs.  She has a current medication list which includes the following prescription(s): amlodipine, atorvastatin, benzonatate, ferrous sulfate, fluticasone, hydrochlorothiazide, medroxyprogesterone, metformin, metoprolol succinate, ondansetron, and ramipril. Also, is allergic to hydrocodone.  Review of Systems  All other systems reviewed and are negative.   Objective: BP (!) 150/100   Ht 5\' 6"  (1.676 m)   Wt 222 lb (100.7 kg)   LMP 02/21/2018   BMI 35.83 kg/m   Physical examination Constitutional NAD, Conversant  Skin No rashes, lesions or ulceration.   Extremities: Moves all appropriately.  Normal ROM for age. No lymphadenopathy.  Neuro: Grossly intact  Psych: Oriented to PPT.  Normal mood. Normal affect.   Assessment:  Fibroids  Menorrhagia Fibroid treatment such as [place], Lupron, Myomectomy, and Hysterectomy discussed in detail, with the pros and cons of each choice counseled.  No treatment as an option also discussed, as well as control of symptoms alone with hormone therapy. Information provided to  the patient. Info given on TLH, to consider.    Medical management with Lupron or Provera, and submucosal hysteroscopic Myomectomy also all discussed in detail w her today.    Finish Provera and monitor BTB.  A total of 15 minutes were spent face-to-face with the patient during this encounter and over half of that time dealt with counseling and coordination of care.  Barnett Applebaum, MD, [facility], [facility] 03/19/2018  5:05 PM

## 2018-03-20 ENCOUNTER — Telehealth: Payer: Self-pay

## 2018-03-20 NOTE — Telephone Encounter (Signed)
Pt wants to go ahead a have the hysterectomy. Pt aware you are not in the office today, You will have someone call and schedule Pre Op appt.

## 2018-03-21 ENCOUNTER — Telehealth: Payer: Self-pay | Admitting: Obstetrics & Gynecology

## 2018-03-21 NOTE — Telephone Encounter (Signed)
-----   Message from Gae Dry, MD sent at 03/20/2018  2:09 PM EDT ----- Regarding: surgery Surgery Booking Request Patient Full Name:  Grace Gallagher  MRN: [id number]  DOB: Apr 01, 1971  Surgeon: Hoyt Koch, MD  Requested Surgery Date and Time: Any time  Primary Diagnosis AND Code: Fibroids Uterus, Menorrhagia Secondary Diagnosis and Code:  Surgical Procedure: TLH/BS L&D Notification: No Admission Status: same day surgery Length of Surgery: 1 hr Special Case Needs: no H&P: yes (date) Phone Interview???: yes Interpreter: Language:  Medical Clearance: no Special Scheduling Instructions: no

## 2018-03-21 NOTE — Telephone Encounter (Signed)
Patient is aware of H&P at [facility] on 04/05/18 @ 2:50pm w/ Dr. Kenton Kingfisher, Pre-admit Testing phone interview to be scheduled, and OR on 04/12/18. Patient is aware to expect calls from the [facility] and [facility]. Ext given.

## 2018-03-30 ENCOUNTER — Other Ambulatory Visit: Payer: Self-pay

## 2018-03-30 NOTE — Telephone Encounter (Signed)
Pt called today to get Dr.Harris to refill her Provera medication, pt stated it helped with her bleeding and it is starting again. Thank you!

## 2018-03-31 MED ORDER — MEDROXYPROGESTERONE ACETATE 10 MG PO TABS: 20.0000 mg | ORAL_TABLET | Freq: Every day | ORAL | 1 refills | Status: DC

## 2018-03-31 NOTE — Telephone Encounter (Signed)
Refill sent to pharmacy.   

## 2018-04-05 ENCOUNTER — Encounter: Payer: Self-pay | Admitting: Obstetrics & Gynecology

## 2018-04-05 ENCOUNTER — Ambulatory Visit (INDEPENDENT_AMBULATORY_CARE_PROVIDER_SITE_OTHER): Payer: Commercial Managed Care - PPO | Admitting: Obstetrics & Gynecology

## 2018-04-05 VITALS — BP 120/80 | HR 77 | Ht 66.0 in | Wt 222.0 lb

## 2018-04-05 DIAGNOSIS — N920 Excessive and frequent menstruation with regular cycle: Secondary | ICD-10-CM

## 2018-04-05 DIAGNOSIS — D219 Benign neoplasm of connective and other soft tissue, unspecified: Secondary | ICD-10-CM | POA: Insufficient documentation

## 2018-04-05 MED ORDER — MEDROXYPROGESTERONE ACETATE 10 MG PO TABS
20.0000 mg | ORAL_TABLET | Freq: Every day | ORAL | 0 refills | Status: DC
Start: 2018-04-05 — End: 2018-04-12

## 2018-04-05 NOTE — H&P (View-Only) (Signed)
PRE-OPERATIVE HISTORY AND PHYSICAL EXAM  HPI:  Grace Gallagher is a 47 y.o. G2P0202 Patient's last menstrual period was 02/17/2018.; she is being admitted for surgery related to abnormal uterine bleeding and fibroids.Periods are usu reg but recent one was prolonged and heavy, lasting >20 days. Dysmenorrhea:moderate, occurring throughout menses.  No intermenstrual bleeding, spotting, or discharge.  Provera has helped w bleeding. Ultrasound demonstrates 3 fibroids, one is submucosal.  Does not want fertility, does not prefer myomectomy, [place], or Lupron.  PMHx: Past Medical History:  Diagnosis Date  . Anemia   . Diabetes mellitus without complication ([place])   . Hypertension   . Obesity    Past Surgical History:  Procedure Laterality Date  . BREAST SURGERY  08/2011   breast reduction  . FOOT SURGERY    . TUBAL LIGATION     Family History  Problem Relation Age of Onset  . Hypertension Mother   . Heart failure Mother   . Heart attack Mother   . Cancer Father    Social History   Tobacco Use  . Smoking status: Never Smoker  . Smokeless tobacco: Never Used  Substance Use Topics  . Alcohol use: No  . Drug use: No    Current Outpatient Medications:  .  amLODipine (NORVASC) 10 MG tablet, Take 1 tablet (10 mg total) by mouth daily., Disp: 30 tablet, Rfl: 6 .  Aspirin-Caffeine (BC FAST PAIN RELIEF PO), Take 1 packet by mouth daily as needed (headaches)., Disp: , Rfl:  .  atorvastatin (LIPITOR) 20 MG tablet, Take 20 mg by mouth daily., Disp: , Rfl: 5 .  ferrous sulfate 325 (65 FE) MG tablet, Take 1 tablet (325 mg total) by mouth daily. (Patient taking differently: Take 325 mg by mouth 2 (two) times daily. ), Disp: 30 tablet, Rfl: 0 .  hydrochlorothiazide (HYDRODIURIL) 25 MG tablet, Take 1 tablet (25 mg total) by mouth daily. (Patient taking differently: Take 25 mg by mouth at bedtime. ), Disp: 90 tablet, Rfl: 3 .  metFORMIN (GLUCOPHAGE) 1000 MG tablet, Take 1,000 mg by mouth 2 (two) times  daily with a meal., Disp: , Rfl:  .  metoprolol succinate (TOPROL-XL) 50 MG 24 hr tablet, Take 50 mg by mouth daily. , Disp: , Rfl: 5 .  ramipril (ALTACE) 10 MG capsule, Take 10 mg by mouth at bedtime. , Disp: , Rfl:  .  benzonatate (TESSALON) 100 MG capsule, Take 1 capsule (100 mg total) by mouth 3 (three) times daily as needed for cough. (Patient not taking: Reported on 03/14/2018), Disp: 21 capsule, Rfl: 0 .  fluticasone (FLONASE) 50 MCG/ACT nasal spray, Place 1 spray into both nostrils daily. (Patient not taking: Reported on 04/05/2018), Disp: 16 g, Rfl: 2 .  medroxyPROGESTERone (PROVERA) 10 MG tablet, Take 2 tablets (20 mg total) by mouth daily for 10 days. (Patient not taking: Reported on 04/05/2018), Disp: 20 tablet, Rfl: 1 Allergies: Hydrocodone  Review of Systems  Constitutional: Negative for chills, fever and malaise/fatigue.  HENT: Negative for congestion, sinus pain and sore throat.   Eyes: Negative for blurred vision and pain.  Respiratory: Negative for cough and wheezing.   Cardiovascular: Negative for chest pain and leg swelling.  Gastrointestinal: Negative for abdominal pain, constipation, diarrhea, heartburn, nausea and vomiting.  Genitourinary: Negative for dysuria, frequency, hematuria and urgency.  Musculoskeletal: Negative for back pain, joint pain, myalgias and neck pain.  Skin: Negative for itching and rash.  Neurological: Negative for dizziness, tremors and weakness.  Endo/Heme/Allergies: Does  not bruise/bleed easily.  Psychiatric/Behavioral: Negative for depression. The patient is not nervous/anxious and does not have insomnia.    Objective: BP 120/80   Pulse 77   Ht 5\' 6"  (1.676 m)   Wt 222 lb (100.7 kg)   LMP 02/17/2018   BMI 35.83 kg/m   Filed Weights   04/05/18 1451  Weight: 222 lb (100.7 kg)   Physical Exam  Constitutional: She is oriented to person, place, and time. She appears well-developed and well-nourished. No distress.  HENT:  Head: Normocephalic  and atraumatic. Head is without laceration.  Right Ear: Hearing normal.  Left Ear: Hearing normal.  Nose: No epistaxis.  No foreign bodies.  Mouth/Throat: Uvula is midline, oropharynx is clear and moist and mucous membranes are normal.  Eyes: Pupils are equal, round, and reactive to light.  Neck: Normal range of motion. Neck supple. No thyromegaly present.  Cardiovascular: Normal rate and regular rhythm. Exam reveals no gallop and no friction rub.  No murmur heard. Pulmonary/Chest: Effort normal and breath sounds normal. No respiratory distress. She has no wheezes. Right breast exhibits no mass, no skin change and no tenderness. Left breast exhibits no mass, no skin change and no tenderness.  Abdominal: Soft. Bowel sounds are normal. She exhibits no distension. There is no tenderness. There is no rebound.  Musculoskeletal: Normal range of motion.  Neurological: She is alert and oriented to person, place, and time. No cranial nerve deficit.  Skin: Skin is warm and dry.  Psychiatric: She has a normal mood and affect. Judgment normal.  Vitals reviewed.  Assessment: 1. Menorrhagia with regular cycle   2. Fibroid   All options discussed.  Prefers hysterectomy.  I have had a careful discussion with this patient about all the options available and the risk/benefits of each. I have fully informed this patient that surgery may subject her to a variety of discomforts and risks: She understands that most patients have surgery with little difficulty, but problems can happen ranging from minor to fatal. These include nausea, vomiting, pain, bleeding, infection, poor healing, hernia, or formation of adhesions. Unexpected reactions may occur from any drug or anesthetic given. Unintended injury may occur to other pelvic or abdominal structures such as Fallopian tubes, ovaries, bladder, ureter (tube from kidney to bladder), or bowel. Nerves going from the pelvis to the legs may be injured. Any such injury may  require immediate or later additional surgery to correct the problem. Excessive blood loss requiring transfusion is very unlikely but possible. Dangerous blood clots may form in the legs or lungs. Physical and sexual activity will be restricted in varying degrees for an indeterminate period of time but most often 2-6 weeks.  Finally, she understands that it is impossible to list every possible undesirable effect and that the condition for which surgery is done is not always cured or significantly improved, and in rare cases may be even worse.Ample time was given to answer all questions.  Barnett Applebaum, MD, [facility], [facility] 04/05/2018  3:20 PM

## 2018-04-05 NOTE — Patient Instructions (Signed)

## 2018-04-05 NOTE — Progress Notes (Signed)
PRE-OPERATIVE HISTORY AND PHYSICAL EXAM  HPI:  Grace Gallagher is a 47 y.o. G2P0202 Patient's last menstrual period was 02/17/2018.; she is being admitted for surgery related to abnormal uterine bleeding and fibroids.Periods are usu reg but recent one was prolonged and heavy, lasting >20 days. Dysmenorrhea:moderate, occurring throughout menses.  No intermenstrual bleeding, spotting, or discharge.  Provera has helped w bleeding. Ultrasound demonstrates 3 fibroids, one is submucosal.  Does not want fertility, does not prefer myomectomy, [place], or Lupron.  PMHx: Past Medical History:  Diagnosis Date  . Anemia   . Diabetes mellitus without complication ([place])   . Hypertension   . Obesity    Past Surgical History:  Procedure Laterality Date  . BREAST SURGERY  08/2011   breast reduction  . FOOT SURGERY    . TUBAL LIGATION     Family History  Problem Relation Age of Onset  . Hypertension Mother   . Heart failure Mother   . Heart attack Mother   . Cancer Father    Social History   Tobacco Use  . Smoking status: Never Smoker  . Smokeless tobacco: Never Used  Substance Use Topics  . Alcohol use: No  . Drug use: No    Current Outpatient Medications:  .  amLODipine (NORVASC) 10 MG tablet, Take 1 tablet (10 mg total) by mouth daily., Disp: 30 tablet, Rfl: 6 .  Aspirin-Caffeine (BC FAST PAIN RELIEF PO), Take 1 packet by mouth daily as needed (headaches)., Disp: , Rfl:  .  atorvastatin (LIPITOR) 20 MG tablet, Take 20 mg by mouth daily., Disp: , Rfl: 5 .  ferrous sulfate 325 (65 FE) MG tablet, Take 1 tablet (325 mg total) by mouth daily. (Patient taking differently: Take 325 mg by mouth 2 (two) times daily. ), Disp: 30 tablet, Rfl: 0 .  hydrochlorothiazide (HYDRODIURIL) 25 MG tablet, Take 1 tablet (25 mg total) by mouth daily. (Patient taking differently: Take 25 mg by mouth at bedtime. ), Disp: 90 tablet, Rfl: 3 .  metFORMIN (GLUCOPHAGE) 1000 MG tablet, Take 1,000 mg by mouth 2 (two) times  daily with a meal., Disp: , Rfl:  .  metoprolol succinate (TOPROL-XL) 50 MG 24 hr tablet, Take 50 mg by mouth daily. , Disp: , Rfl: 5 .  ramipril (ALTACE) 10 MG capsule, Take 10 mg by mouth at bedtime. , Disp: , Rfl:  .  benzonatate (TESSALON) 100 MG capsule, Take 1 capsule (100 mg total) by mouth 3 (three) times daily as needed for cough. (Patient not taking: Reported on 03/14/2018), Disp: 21 capsule, Rfl: 0 .  fluticasone (FLONASE) 50 MCG/ACT nasal spray, Place 1 spray into both nostrils daily. (Patient not taking: Reported on 04/05/2018), Disp: 16 g, Rfl: 2 .  medroxyPROGESTERone (PROVERA) 10 MG tablet, Take 2 tablets (20 mg total) by mouth daily for 10 days. (Patient not taking: Reported on 04/05/2018), Disp: 20 tablet, Rfl: 1 Allergies: Hydrocodone  Review of Systems  Constitutional: Negative for chills, fever and malaise/fatigue.  HENT: Negative for congestion, sinus pain and sore throat.   Eyes: Negative for blurred vision and pain.  Respiratory: Negative for cough and wheezing.   Cardiovascular: Negative for chest pain and leg swelling.  Gastrointestinal: Negative for abdominal pain, constipation, diarrhea, heartburn, nausea and vomiting.  Genitourinary: Negative for dysuria, frequency, hematuria and urgency.  Musculoskeletal: Negative for back pain, joint pain, myalgias and neck pain.  Skin: Negative for itching and rash.  Neurological: Negative for dizziness, tremors and weakness.  Endo/Heme/Allergies: Does  not bruise/bleed easily.  Psychiatric/Behavioral: Negative for depression. The patient is not nervous/anxious and does not have insomnia.    Objective: BP 120/80   Pulse 77   Ht 5\' 6"  (1.676 m)   Wt 222 lb (100.7 kg)   LMP 02/17/2018   BMI 35.83 kg/m   Filed Weights   04/05/18 1451  Weight: 222 lb (100.7 kg)   Physical Exam  Constitutional: She is oriented to person, place, and time. She appears well-developed and well-nourished. No distress.  HENT:  Head: Normocephalic  and atraumatic. Head is without laceration.  Right Ear: Hearing normal.  Left Ear: Hearing normal.  Nose: No epistaxis.  No foreign bodies.  Mouth/Throat: Uvula is midline, oropharynx is clear and moist and mucous membranes are normal.  Eyes: Pupils are equal, round, and reactive to light.  Neck: Normal range of motion. Neck supple. No thyromegaly present.  Cardiovascular: Normal rate and regular rhythm. Exam reveals no gallop and no friction rub.  No murmur heard. Pulmonary/Chest: Effort normal and breath sounds normal. No respiratory distress. She has no wheezes. Right breast exhibits no mass, no skin change and no tenderness. Left breast exhibits no mass, no skin change and no tenderness.  Abdominal: Soft. Bowel sounds are normal. She exhibits no distension. There is no tenderness. There is no rebound.  Musculoskeletal: Normal range of motion.  Neurological: She is alert and oriented to person, place, and time. No cranial nerve deficit.  Skin: Skin is warm and dry.  Psychiatric: She has a normal mood and affect. Judgment normal.  Vitals reviewed.  Assessment: 1. Menorrhagia with regular cycle   2. Fibroid   All options discussed.  Prefers hysterectomy.  I have had a careful discussion with this patient about all the options available and the risk/benefits of each. I have fully informed this patient that surgery may subject her to a variety of discomforts and risks: She understands that most patients have surgery with little difficulty, but problems can happen ranging from minor to fatal. These include nausea, vomiting, pain, bleeding, infection, poor healing, hernia, or formation of adhesions. Unexpected reactions may occur from any drug or anesthetic given. Unintended injury may occur to other pelvic or abdominal structures such as Fallopian tubes, ovaries, bladder, ureter (tube from kidney to bladder), or bowel. Nerves going from the pelvis to the legs may be injured. Any such injury may  require immediate or later additional surgery to correct the problem. Excessive blood loss requiring transfusion is very unlikely but possible. Dangerous blood clots may form in the legs or lungs. Physical and sexual activity will be restricted in varying degrees for an indeterminate period of time but most often 2-6 weeks.  Finally, she understands that it is impossible to list every possible undesirable effect and that the condition for which surgery is done is not always cured or significantly improved, and in rare cases may be even worse.Ample time was given to answer all questions.  Barnett Applebaum, MD, [facility], [facility] 04/05/2018  3:20 PM

## 2018-04-06 ENCOUNTER — Other Ambulatory Visit: Payer: Self-pay

## 2018-04-06 ENCOUNTER — Encounter
Admission: RE | Admit: 2018-04-06 | Discharge: 2018-04-06 | Disposition: A | Discharge status: Home / Self Care | Payer: Commercial Managed Care - PPO | Source: Ambulatory Visit | Attending: Obstetrics & Gynecology | Admitting: Obstetrics & Gynecology

## 2018-04-06 NOTE — Patient Instructions (Signed)
Your procedure is scheduled on: Thursday, Apr 12, 2018 Report to Day Surgery on the 2nd floor of the [facility]. To find out your arrival time, please call [phone] between 1PM - 3PM on: Wednesday, Apr 11, 2018  REMEMBER: Instructions that are not followed completely may result in serious medical risk, up to and including death; or upon the discretion of your surgeon and anesthesiologist your surgery may need to be rescheduled.  Do not eat food after midnight the night before your procedure.  No gum chewing, lozengers or hard candies.  You may however, drink CLEAR liquids up to 2 hours before you are scheduled to arrive for your surgery. Do not drink anything within 2 hours of the start of your surgery.  Clear liquids include: - water  - apple juice without pulp - clear gatorade - black coffee or tea (Do NOT add anything to the coffee or tea) Do NOT drink anything that is not on this list.  Type 1 and Type 2 diabetics should only drink water.  No Alcohol for 24 hours before or after surgery.  No Smoking including e-cigarettes for 24 hours prior to surgery.  No chewable tobacco products for at least 6 hours prior to surgery.  No nicotine patches on the day of surgery.  On the morning of surgery brush your teeth with toothpaste and water, you may rinse your mouth with mouthwash if you wish. Do not swallow any toothpaste or mouthwash.  Notify your doctor if there is any change in your medical condition (cold, fever, infection).  Do not wear jewelry, make-up, hairpins, clips or nail polish.  Do not wear lotions, powders, or perfumes. You may wear deodorant.  Do not shave 48 hours prior to surgery.   Contacts and dentures may not be worn into surgery.  Do not bring valuables to the hospital, including drivers license, insurance or credit cards.  [facility] is not responsible for any belongings or valuables.   TAKE THESE MEDICATIONS THE MORNING OF SURGERY:  1.   Amlodipine 2.  Metoprolol  Use CHG Soap as directed on instruction sheet.  Bring your C-PAP to the hospital with you in case you may have to spend the night.   Stop Metformin 2 days prior to surgery. Last day to take is Monday, Apr 09, 2018. Resume after surgery.  NOW!  Stop Anti-inflammatories (NSAIDS) such as Advil, Aleve, Ibuprofen, Motrin, Naproxen, Naprosyn and Aspirin based products such as Excedrin, Goodys Powder, BC Powder. (May take Tylenol or Acetaminophen if needed.)  NOW!  Stop ANY OVER THE COUNTER supplements until after surgery.  Wear comfortable clothing (specific to your surgery type) to the hospital.  Plan for stool softeners for home use.  If you are being admitted to the hospital overnight, leave your suitcase in the car. After surgery it may be brought to your room.  If you are being discharged the day of surgery, you will not be allowed to drive home. You will need a responsible adult to drive you home and stay with you that night.   If you are taking public transportation, you will need to have a responsible adult with you. Please confirm with your physician that it is acceptable to use public transportation.   Please call [phone] if you have any questions about these instructions.

## 2018-04-09 ENCOUNTER — Encounter
Admission: RE | Admit: 2018-04-09 | Discharge: 2018-04-09 | Disposition: A | Discharge status: Home / Self Care | Payer: Commercial Managed Care - PPO | Source: Ambulatory Visit | Attending: Obstetrics & Gynecology | Admitting: Obstetrics & Gynecology

## 2018-04-09 DIAGNOSIS — Z6841 Body Mass Index (BMI) 40.0 and over, adult: Secondary | ICD-10-CM | POA: Diagnosis not present

## 2018-04-09 DIAGNOSIS — N838 Other noninflammatory disorders of ovary, fallopian tube and broad ligament: Secondary | ICD-10-CM | POA: Diagnosis not present

## 2018-04-09 DIAGNOSIS — E669 Obesity, unspecified: Secondary | ICD-10-CM | POA: Diagnosis not present

## 2018-04-09 DIAGNOSIS — Z9851 Tubal ligation status: Secondary | ICD-10-CM | POA: Diagnosis not present

## 2018-04-09 DIAGNOSIS — N888 Other specified noninflammatory disorders of cervix uteri: Secondary | ICD-10-CM | POA: Diagnosis not present

## 2018-04-09 DIAGNOSIS — Z79899 Other long term (current) drug therapy: Secondary | ICD-10-CM | POA: Diagnosis not present

## 2018-04-09 DIAGNOSIS — N800 Endometriosis of uterus: Secondary | ICD-10-CM | POA: Diagnosis not present

## 2018-04-09 DIAGNOSIS — N840 Polyp of corpus uteri: Secondary | ICD-10-CM | POA: Diagnosis not present

## 2018-04-09 DIAGNOSIS — D259 Leiomyoma of uterus, unspecified: Secondary | ICD-10-CM | POA: Diagnosis present

## 2018-04-09 DIAGNOSIS — E119 Type 2 diabetes mellitus without complications: Secondary | ICD-10-CM | POA: Diagnosis not present

## 2018-04-09 DIAGNOSIS — Z7982 Long term (current) use of aspirin: Secondary | ICD-10-CM | POA: Diagnosis not present

## 2018-04-09 DIAGNOSIS — N72 Inflammatory disease of cervix uteri: Secondary | ICD-10-CM | POA: Diagnosis not present

## 2018-04-09 DIAGNOSIS — Z01810 Encounter for preprocedural cardiovascular examination: Secondary | ICD-10-CM | POA: Diagnosis not present

## 2018-04-09 DIAGNOSIS — Z01812 Encounter for preprocedural laboratory examination: Secondary | ICD-10-CM | POA: Diagnosis not present

## 2018-04-09 DIAGNOSIS — I10 Essential (primary) hypertension: Secondary | ICD-10-CM | POA: Diagnosis not present

## 2018-04-09 DIAGNOSIS — Z7984 Long term (current) use of oral hypoglycemic drugs: Secondary | ICD-10-CM | POA: Diagnosis not present

## 2018-04-09 LAB — CBC
HCT: 34.2 % — ABNORMAL LOW (ref 35.0–47.0)
HEMOGLOBIN: 10.7 g/dL — AB (ref 12.0–16.0)
MCH: 23 pg — AB (ref 26.0–34.0)
MCHC: 31.4 g/dL — ABNORMAL LOW (ref 32.0–36.0)
MCV: 73.2 fL — ABNORMAL LOW (ref 80.0–100.0)
Platelets: 218 10*3/uL (ref 150–440)
RBC: 4.67 MIL/uL (ref 3.80–5.20)
RDW: 18.9 % — ABNORMAL HIGH (ref 11.5–14.5)
WBC: 7.6 10*3/uL (ref 3.6–11.0)

## 2018-04-09 LAB — TYPE AND SCREEN
ABO/RH(D): A POS
ANTIBODY SCREEN: NEGATIVE

## 2018-04-12 ENCOUNTER — Encounter
Admission: RE | Disposition: A | Discharge status: Home / Self Care | Payer: Self-pay | Source: Ambulatory Visit | Attending: Obstetrics & Gynecology

## 2018-04-12 ENCOUNTER — Ambulatory Visit
Admission: RE | Admit: 2018-04-12 | Discharge: 2018-04-12 | Disposition: A | Discharge status: Home / Self Care | Payer: Commercial Managed Care - PPO | Source: Ambulatory Visit | Attending: Obstetrics & Gynecology | Admitting: Obstetrics & Gynecology

## 2018-04-12 ENCOUNTER — Ambulatory Visit: Payer: Commercial Managed Care - PPO | Admitting: Anesthesiology

## 2018-04-12 ENCOUNTER — Encounter: Payer: Self-pay | Admitting: Certified Registered Nurse Anesthetist

## 2018-04-12 DIAGNOSIS — E119 Type 2 diabetes mellitus without complications: Secondary | ICD-10-CM | POA: Insufficient documentation

## 2018-04-12 DIAGNOSIS — Z79899 Other long term (current) drug therapy: Secondary | ICD-10-CM | POA: Insufficient documentation

## 2018-04-12 DIAGNOSIS — N888 Other specified noninflammatory disorders of cervix uteri: Secondary | ICD-10-CM | POA: Diagnosis not present

## 2018-04-12 DIAGNOSIS — E669 Obesity, unspecified: Secondary | ICD-10-CM | POA: Insufficient documentation

## 2018-04-12 DIAGNOSIS — Z7984 Long term (current) use of oral hypoglycemic drugs: Secondary | ICD-10-CM | POA: Insufficient documentation

## 2018-04-12 DIAGNOSIS — Z9851 Tubal ligation status: Secondary | ICD-10-CM | POA: Insufficient documentation

## 2018-04-12 DIAGNOSIS — N920 Excessive and frequent menstruation with regular cycle: Secondary | ICD-10-CM | POA: Diagnosis present

## 2018-04-12 DIAGNOSIS — Z01810 Encounter for preprocedural cardiovascular examination: Secondary | ICD-10-CM | POA: Insufficient documentation

## 2018-04-12 DIAGNOSIS — N800 Endometriosis of uterus: Secondary | ICD-10-CM | POA: Insufficient documentation

## 2018-04-12 DIAGNOSIS — Z7982 Long term (current) use of aspirin: Secondary | ICD-10-CM | POA: Insufficient documentation

## 2018-04-12 DIAGNOSIS — N72 Inflammatory disease of cervix uteri: Secondary | ICD-10-CM | POA: Insufficient documentation

## 2018-04-12 DIAGNOSIS — N840 Polyp of corpus uteri: Secondary | ICD-10-CM | POA: Insufficient documentation

## 2018-04-12 DIAGNOSIS — D219 Benign neoplasm of connective and other soft tissue, unspecified: Secondary | ICD-10-CM | POA: Diagnosis present

## 2018-04-12 DIAGNOSIS — N838 Other noninflammatory disorders of ovary, fallopian tube and broad ligament: Secondary | ICD-10-CM | POA: Insufficient documentation

## 2018-04-12 DIAGNOSIS — Z01812 Encounter for preprocedural laboratory examination: Secondary | ICD-10-CM | POA: Insufficient documentation

## 2018-04-12 DIAGNOSIS — I10 Essential (primary) hypertension: Secondary | ICD-10-CM | POA: Insufficient documentation

## 2018-04-12 DIAGNOSIS — Z6841 Body Mass Index (BMI) 40.0 and over, adult: Secondary | ICD-10-CM | POA: Insufficient documentation

## 2018-04-12 HISTORY — PX: LAPAROSCOPIC HYSTERECTOMY: SHX1926

## 2018-04-12 HISTORY — PX: CYSTOSCOPY: SHX5120

## 2018-04-12 LAB — GLUCOSE, CAPILLARY
GLUCOSE-CAPILLARY: 123 mg/dL — AB (ref 65–99)
Glucose-Capillary: 121 mg/dL — ABNORMAL HIGH (ref 65–99)

## 2018-04-12 LAB — ABO/RH: ABO/RH(D): A POS

## 2018-04-12 LAB — POCT PREGNANCY, URINE: Preg Test, Ur: NEGATIVE

## 2018-04-12 SURGERY — HYSTERECTOMY, TOTAL, LAPAROSCOPIC
Anesthesia: General | Laterality: Bilateral

## 2018-04-12 MED ORDER — HYDROMORPHONE HCL 1 MG/ML IJ SOLN
INTRAMUSCULAR | Status: AC
  Administered 2018-04-12: 0.5 mg via INTRAVENOUS
  Filled 2018-04-12: qty 1

## 2018-04-12 MED ORDER — ONDANSETRON HCL 4 MG/2ML IJ SOLN
INTRAMUSCULAR | Status: AC
  Filled 2018-04-12: qty 2

## 2018-04-12 MED ORDER — OXYCODONE-ACETAMINOPHEN 5-325 MG PO TABS: 1.0000 | ORAL_TABLET | ORAL | 0 refills | Status: DC | PRN

## 2018-04-12 MED ORDER — EPHEDRINE SULFATE 50 MG/ML IJ SOLN
INTRAMUSCULAR | Status: DC | PRN
  Administered 2018-04-12: 10 mg via INTRAVENOUS

## 2018-04-12 MED ORDER — ONDANSETRON HCL 4 MG/2ML IJ SOLN
4.0000 mg | Freq: Once | INTRAMUSCULAR | Status: AC | PRN
  Administered 2018-04-12: 4 mg via INTRAVENOUS

## 2018-04-12 MED ORDER — SEVOFLURANE IN SOLN
RESPIRATORY_TRACT | Status: AC
  Filled 2018-04-12: qty 250

## 2018-04-12 MED ORDER — KETOROLAC TROMETHAMINE 30 MG/ML IJ SOLN: 30.0000 mg | Freq: Four times a day (QID) | INTRAMUSCULAR | Status: DC

## 2018-04-12 MED ORDER — FENTANYL CITRATE (PF) 100 MCG/2ML IJ SOLN
INTRAMUSCULAR | Status: AC
  Administered 2018-04-12: 25 ug via INTRAVENOUS
  Filled 2018-04-12: qty 2

## 2018-04-12 MED ORDER — ONDANSETRON HCL 4 MG/2ML IJ SOLN
INTRAMUSCULAR | Status: DC | PRN
  Administered 2018-04-12: 4 mg via INTRAVENOUS

## 2018-04-12 MED ORDER — DEXAMETHASONE SODIUM PHOSPHATE 10 MG/ML IJ SOLN
INTRAMUSCULAR | Status: DC | PRN
  Administered 2018-04-12: 10 mg via INTRAVENOUS

## 2018-04-12 MED ORDER — MIDAZOLAM HCL 2 MG/2ML IJ SOLN
INTRAMUSCULAR | Status: AC
  Filled 2018-04-12: qty 2

## 2018-04-12 MED ORDER — ROCURONIUM BROMIDE 50 MG/5ML IV SOLN
INTRAVENOUS | Status: AC
  Filled 2018-04-12: qty 1

## 2018-04-12 MED ORDER — OXYCODONE-ACETAMINOPHEN 5-325 MG PO TABS
1.0000 | ORAL_TABLET | ORAL | Status: DC | PRN
  Administered 2018-04-12: 1 via ORAL

## 2018-04-12 MED ORDER — MIDAZOLAM HCL 2 MG/2ML IJ SOLN
INTRAMUSCULAR | Status: DC | PRN
  Administered 2018-04-12 (×2): 1 mg via INTRAVENOUS

## 2018-04-12 MED ORDER — BUPIVACAINE HCL (PF) 0.5 % IJ SOLN
INTRAMUSCULAR | Status: AC
  Filled 2018-04-12: qty 30

## 2018-04-12 MED ORDER — PROMETHAZINE HCL 25 MG/ML IJ SOLN
INTRAMUSCULAR | Status: AC
  Administered 2018-04-12: 6.25 mg via INTRAVENOUS
  Filled 2018-04-12: qty 1

## 2018-04-12 MED ORDER — LIDOCAINE HCL (CARDIAC) PF 100 MG/5ML IV SOSY
PREFILLED_SYRINGE | INTRAVENOUS | Status: DC | PRN
  Administered 2018-04-12: 80 mg via INTRAVENOUS

## 2018-04-12 MED ORDER — HYDROMORPHONE HCL 1 MG/ML IJ SOLN
0.5000 mg | INTRAMUSCULAR | Status: DC | PRN
  Administered 2018-04-12 (×3): 0.5 mg via INTRAVENOUS

## 2018-04-12 MED ORDER — MORPHINE SULFATE (PF) 4 MG/ML IV SOLN: 1.0000 mg | INTRAVENOUS | Status: DC | PRN

## 2018-04-12 MED ORDER — SODIUM CHLORIDE 0.9 % IV SOLN
INTRAVENOUS | Status: DC
  Administered 2018-04-12: 09:00:00 via INTRAVENOUS

## 2018-04-12 MED ORDER — KETOROLAC TROMETHAMINE 30 MG/ML IJ SOLN
INTRAMUSCULAR | Status: DC | PRN
  Administered 2018-04-12: 30 mg via INTRAVENOUS

## 2018-04-12 MED ORDER — ACETAMINOPHEN 10 MG/ML IV SOLN
INTRAVENOUS | Status: DC | PRN
  Administered 2018-04-12: 1000 mg via INTRAVENOUS

## 2018-04-12 MED ORDER — PROPOFOL 10 MG/ML IV BOLUS
INTRAVENOUS | Status: AC
  Filled 2018-04-12: qty 20

## 2018-04-12 MED ORDER — SODIUM CHLORIDE 0.9 % IJ SOLN
INTRAMUSCULAR | Status: AC
  Filled 2018-04-12: qty 10

## 2018-04-12 MED ORDER — LACTATED RINGERS IV SOLN: INTRAVENOUS | Status: DC

## 2018-04-12 MED ORDER — PROMETHAZINE HCL 25 MG/ML IJ SOLN
6.2500 mg | Freq: Once | INTRAMUSCULAR | Status: AC
  Administered 2018-04-12: 6.25 mg via INTRAVENOUS

## 2018-04-12 MED ORDER — FENTANYL CITRATE (PF) 100 MCG/2ML IJ SOLN
INTRAMUSCULAR | Status: AC
  Filled 2018-04-12: qty 2

## 2018-04-12 MED ORDER — FENTANYL CITRATE (PF) 100 MCG/2ML IJ SOLN
INTRAMUSCULAR | Status: DC | PRN
  Administered 2018-04-12 (×2): 50 ug via INTRAVENOUS

## 2018-04-12 MED ORDER — CEFOXITIN SODIUM-DEXTROSE 2-2.2 GM-%(50ML) IV SOLR
INTRAVENOUS | Status: AC
  Filled 2018-04-12: qty 50

## 2018-04-12 MED ORDER — EPHEDRINE SULFATE 50 MG/ML IJ SOLN
INTRAMUSCULAR | Status: AC
  Filled 2018-04-12: qty 1

## 2018-04-12 MED ORDER — FAMOTIDINE 20 MG PO TABS
20.0000 mg | ORAL_TABLET | Freq: Once | ORAL | Status: AC
  Administered 2018-04-12: 20 mg via ORAL

## 2018-04-12 MED ORDER — LIDOCAINE HCL (PF) 2 % IJ SOLN
INTRAMUSCULAR | Status: AC
  Filled 2018-04-12: qty 10

## 2018-04-12 MED ORDER — ACETAMINOPHEN 325 MG PO TABS: 650.0000 mg | ORAL_TABLET | ORAL | Status: DC | PRN

## 2018-04-12 MED ORDER — KETOROLAC TROMETHAMINE 30 MG/ML IJ SOLN
INTRAMUSCULAR | Status: AC
  Filled 2018-04-12: qty 1

## 2018-04-12 MED ORDER — SUGAMMADEX SODIUM 500 MG/5ML IV SOLN
INTRAVENOUS | Status: AC
  Filled 2018-04-12: qty 5

## 2018-04-12 MED ORDER — FENTANYL CITRATE (PF) 100 MCG/2ML IJ SOLN
25.0000 ug | INTRAMUSCULAR | Status: AC | PRN
  Administered 2018-04-12 (×6): 25 ug via INTRAVENOUS

## 2018-04-12 MED ORDER — ACETAMINOPHEN 650 MG RE SUPP
650.0000 mg | RECTAL | Status: DC | PRN
  Filled 2018-04-12: qty 1

## 2018-04-12 MED ORDER — SUCCINYLCHOLINE CHLORIDE 20 MG/ML IJ SOLN
INTRAMUSCULAR | Status: DC | PRN
  Administered 2018-04-12: 120 mg via INTRAVENOUS

## 2018-04-12 MED ORDER — ACETAMINOPHEN NICU IV SYRINGE 10 MG/ML
INTRAVENOUS | Status: AC
  Filled 2018-04-12: qty 1

## 2018-04-12 MED ORDER — DEXAMETHASONE SODIUM PHOSPHATE 10 MG/ML IJ SOLN
INTRAMUSCULAR | Status: AC
  Filled 2018-04-12: qty 1

## 2018-04-12 MED ORDER — OXYCODONE-ACETAMINOPHEN 5-325 MG PO TABS
ORAL_TABLET | ORAL | Status: AC
  Filled 2018-04-12: qty 1

## 2018-04-12 MED ORDER — LACTATED RINGERS IV SOLN
INTRAVENOUS | Status: DC | PRN
  Administered 2018-04-12 (×2): via INTRAVENOUS

## 2018-04-12 MED ORDER — ROCURONIUM BROMIDE 100 MG/10ML IV SOLN
INTRAVENOUS | Status: DC | PRN
  Administered 2018-04-12: 5 mg via INTRAVENOUS
  Administered 2018-04-12: 40 mg via INTRAVENOUS
  Administered 2018-04-12: 5 mg via INTRAVENOUS

## 2018-04-12 MED ORDER — CEFOXITIN SODIUM-DEXTROSE 2-2.2 GM-%(50ML) IV SOLR
2.0000 g | INTRAVENOUS | Status: AC
  Administered 2018-04-12: 2 g via INTRAVENOUS

## 2018-04-12 MED ORDER — FAMOTIDINE 20 MG PO TABS
ORAL_TABLET | ORAL | Status: AC
  Administered 2018-04-12: 20 mg via ORAL
  Filled 2018-04-12: qty 1

## 2018-04-12 MED ORDER — OXYCODONE-ACETAMINOPHEN 5-325 MG PO TABS: 1.0000 | ORAL_TABLET | Freq: Once | ORAL | Status: DC

## 2018-04-12 MED ORDER — LIDOCAINE HCL (PF) 1 % IJ SOLN
INTRAMUSCULAR | Status: AC
  Filled 2018-04-12: qty 2

## 2018-04-12 MED ORDER — PROPOFOL 10 MG/ML IV BOLUS
INTRAVENOUS | Status: DC | PRN
  Administered 2018-04-12: 200 mg via INTRAVENOUS

## 2018-04-12 MED ORDER — BUPIVACAINE HCL (PF) 0.5 % IJ SOLN
INTRAMUSCULAR | Status: DC | PRN
  Administered 2018-04-12: 12 mL

## 2018-04-12 MED ORDER — SUGAMMADEX SODIUM 500 MG/5ML IV SOLN
INTRAVENOUS | Status: DC | PRN
  Administered 2018-04-12: 495.2 mg via INTRAVENOUS

## 2018-04-12 SURGICAL SUPPLY — 48 items
BAG URINE DRAINAGE (UROLOGICAL SUPPLIES) ×3 IMPLANT
BLADE SURG SZ11 CARB STEEL (BLADE) ×3 IMPLANT
CANISTER SUCT 1200ML W/VALVE (MISCELLANEOUS) ×3 IMPLANT
CATH FOLEY 2WAY  5CC 16FR (CATHETERS) ×1
CATH URTH 16FR FL 2W BLN LF (CATHETERS) ×2 IMPLANT
CHLORAPREP W/TINT 26ML (MISCELLANEOUS) ×3 IMPLANT
DEFOGGER SCOPE WARMER CLEARIFY (MISCELLANEOUS) ×3 IMPLANT
DERMABOND ADVANCED (GAUZE/BANDAGES/DRESSINGS) ×1
DERMABOND ADVANCED .7 DNX12 (GAUZE/BANDAGES/DRESSINGS) ×2 IMPLANT
DEVICE SUTURE ENDOST 10MM (ENDOMECHANICALS) ×1 IMPLANT
DRAPE CAMERA CLOSED 9X96 (DRAPES) ×3 IMPLANT
DRSG TEGADERM 2-3/8X2-3/4 SM (GAUZE/BANDAGES/DRESSINGS) IMPLANT
GLOVE BIO SURGEON STRL SZ8 (GLOVE) ×15 IMPLANT
GLOVE INDICATOR 8.0 STRL GRN (GLOVE) ×7 IMPLANT
GOWN STRL REUS W/ TWL LRG LVL3 (GOWN DISPOSABLE) ×2 IMPLANT
GOWN STRL REUS W/ TWL XL LVL3 (GOWN DISPOSABLE) ×4 IMPLANT
GOWN STRL REUS W/TWL LRG LVL3 (GOWN DISPOSABLE) ×1
GOWN STRL REUS W/TWL XL LVL3 (GOWN DISPOSABLE) ×2
GRASPER SUT TROCAR 14GX15 (MISCELLANEOUS) ×3 IMPLANT
IRRIGATION STRYKERFLOW (MISCELLANEOUS) ×2 IMPLANT
IRRIGATOR STRYKERFLOW (MISCELLANEOUS) ×3
IV LACTATED RINGERS 1000ML (IV SOLUTION) ×6 IMPLANT
KIT PINK PAD W/HEAD ARE REST (MISCELLANEOUS) ×3
KIT PINK PAD W/HEAD ARM REST (MISCELLANEOUS) ×2 IMPLANT
KIT TURNOVER CYSTO (KITS) ×3 IMPLANT
LABEL OR SOLS (LABEL) ×3 IMPLANT
MANIPULATOR VCARE LG CRV RETR (MISCELLANEOUS) IMPLANT
MANIPULATOR VCARE SML CRV RETR (MISCELLANEOUS) IMPLANT
MANIPULATOR VCARE STD CRV RETR (MISCELLANEOUS) ×1 IMPLANT
NEEDLE VERESS 14GA 120MM (NEEDLE) ×3 IMPLANT
NS IRRIG 500ML POUR BTL (IV SOLUTION) ×3 IMPLANT
OCCLUDER COLPOPNEUMO (BALLOONS) ×3 IMPLANT
PACK GYN LAPAROSCOPIC (MISCELLANEOUS) ×3 IMPLANT
PAD OB MATERNITY 4.3X12.25 (PERSONAL CARE ITEMS) ×3 IMPLANT
PAD PREP 24X41 OB/GYN DISP (PERSONAL CARE ITEMS) ×3 IMPLANT
SCISSORS METZENBAUM CVD 33 (INSTRUMENTS) ×1 IMPLANT
SET CYSTO W/LG BORE CLAMP LF (SET/KITS/TRAYS/PACK) ×3 IMPLANT
SHEARS HARMONIC ACE PLUS 36CM (ENDOMECHANICALS) ×3 IMPLANT
SLEEVE ENDOPATH XCEL 5M (ENDOMECHANICALS) ×3 IMPLANT
SPONGE GAUZE 2X2 8PLY STRL LF (GAUZE/BANDAGES/DRESSINGS) IMPLANT
SUT ENDO VLOC 180-0-8IN (SUTURE) IMPLANT
SUT VIC AB 0 CT1 36 (SUTURE) ×3 IMPLANT
SUT VIC AB 4-0 FS2 27 (SUTURE) ×3 IMPLANT
SYR 10ML LL (SYRINGE) ×3 IMPLANT
SYR 50ML LL SCALE MARK (SYRINGE) ×3 IMPLANT
TROCAR ENDO BLADELESS 11MM (ENDOMECHANICALS) ×3 IMPLANT
TROCAR XCEL NON-BLD 5MMX100MML (ENDOMECHANICALS) ×3 IMPLANT
TUBING INSUF HEATED (TUBING) ×3 IMPLANT

## 2018-04-12 NOTE — Discharge Instructions (Signed)
Total Laparoscopic Hysterectomy, Care After °Refer to this sheet in the next few weeks. These instructions provide you with information on caring for yourself after your procedure. Your health care provider may also give you more specific instructions. Your treatment has been planned according to current medical practices, but problems sometimes occur. Call your health care provider if you have any problems or questions after your procedure. °What can I expect after the procedure? °· Pain and bruising at the incision sites. You will be given pain medicine to control it. °· Menopausal symptoms such as hot flashes, night sweats, and insomnia if your ovaries were removed. °· Sore throat from the breathing tube that was inserted during surgery. °Follow these instructions at home: °· Only take over-the-counter or prescription medicines for pain, discomfort, or fever as directed by your health care provider. °· Do not take aspirin. It can cause bleeding. °· Do not drive when taking pain medicine. °· Follow your health care provider's advice regarding diet, exercise, lifting, driving, and general activities. °· Resume your usual diet as directed and allowed. °· Get plenty of rest and sleep. °· Do not douche, use tampons, or have sexual intercourse for at least 6 weeks, or until your health care provider gives you permission. °· Change your bandages (dressings) as directed by your health care provider. °· Monitor your temperature and notify your health care provider of a fever. °· Take showers instead of baths for 2-3 weeks. °· Do not drink alcohol until your health care provider gives you permission. °· If you develop constipation, you may take a mild laxative with your health care provider's permission. Bran foods may help with constipation problems. Drinking enough fluids to keep your urine clear or pale yellow may help as well. °· Try to have someone home with you for 1-2 weeks to help around the house. °· Keep all of  your follow-up appointments as directed by your health care provider. °Contact a health care provider if: °· You have swelling, redness, or increasing pain around your incision sites. °· You have pus coming from your incision. °· You notice a bad smell coming from your incision. °· Your incision breaks open. °· You feel dizzy or lightheaded. °· You have pain or bleeding when you urinate. °· You have persistent diarrhea. °· You have persistent nausea and vomiting. °· You have abnormal vaginal discharge. °· You have a rash. °· You have any type of abnormal reaction or develop an allergy to your medicine. °· You have poor pain control with your prescribed medicine. °Get help right away if: °· You have chest pain or shortness of breath. °· You have severe abdominal pain that is not relieved with pain medicine. °· You have pain or swelling in your legs. °This information is not intended to replace advice given to you by your health care provider. Make sure you discuss any questions you have with your health care provider. °Document Released: 09/11/2013 Document Revised: 04/28/2016 Document Reviewed: 06/11/2013 °Elsevier Interactive Patient Education © 2017 [employer] ° °AMBULATORY SURGERY  °DISCHARGE INSTRUCTIONS ° ° °1) The drugs that you were given will stay in your system until tomorrow so for the next 24 hours you should not: ° °A) Drive an automobile °B) Make any legal decisions °C) Drink any alcoholic beverage ° ° °2) You may resume regular meals tomorrow.  Today it is better to start with liquids and gradually work up to solid foods. ° °You may eat anything you prefer, but it is better   to start with liquids, then soup and crackers, and gradually work up to solid foods. ° ° °3) Please notify your doctor immediately if you have any unusual bleeding, trouble breathing, redness and pain at the surgery site, drainage, fever, or pain not relieved by medication. ° ° ° °4) Additional Instructions: ° ° ° ° ° ° ° °Please  contact your physician with any problems or Same Day Surgery at [phone], Monday through Friday 6 am to 4 pm, or [facility] Main number at [phone]. ° °

## 2018-04-12 NOTE — Transfer of Care (Signed)
Immediate Anesthesia Transfer of Care Note  Patient: Grace Gallagher  Procedure(s) Performed: HYSTERECTOMY TOTAL LAPAROSCOPIC BILATERAL SALPINGECTOMY (Bilateral ) CYSTOSCOPY  Patient Location: PACU  Anesthesia Type:General  Level of Consciousness: drowsy  Airway & Oxygen Therapy: Patient Spontanous Breathing  Post-op Assessment: Report given to RN  Post vital signs: stable  Last Vitals:  Vitals Value Taken Time  BP 135/96 04/12/2018 11:57 AM  Temp    Pulse 78 04/12/2018 11:58 AM  Resp 19 04/12/2018 11:58 AM  SpO2 100 % 04/12/2018 11:58 AM  Vitals shown include unvalidated device data.  Last Pain:  Vitals:   04/12/18 0853  TempSrc: Oral         Complications: No apparent anesthesia complications

## 2018-04-12 NOTE — Anesthesia Post-op Follow-up Note (Signed)
Anesthesia QCDR form completed.        

## 2018-04-12 NOTE — OR Nursing (Signed)
Patient had emesis occurrence in Post Op.  RN contacted Dr. Marcello Moores to provide SOAP report.  It was recommended that patient receive phenergan 6.25 mg IV.  Pt tolerated well.

## 2018-04-12 NOTE — Interval H&P Note (Signed)
History and Physical Interval Note:  04/12/2018 9:37 AM  Grace Gallagher  has presented today for surgery, with the diagnosis of FIBROID UTERUS, MENORRHAGIA  The various methods of treatment have been discussed with the patient and family. After consideration of risks, benefits and other options for treatment, the patient has consented to  Procedure(s): HYSTERECTOMY TOTAL LAPAROSCOPIC BILATERAL SALPINGECTOMY (Bilateral) as a surgical intervention .  The patient's history has been reviewed, patient examined, no change in status, stable for surgery.  I have reviewed the patient's chart and labs.  Questions were answered to the patient's satisfaction.     Hoyt Koch

## 2018-04-12 NOTE — Anesthesia Postprocedure Evaluation (Signed)
Anesthesia Post Note  Patient: Grace Gallagher  Procedure(s) Performed: HYSTERECTOMY TOTAL LAPAROSCOPIC BILATERAL SALPINGECTOMY (Bilateral ) CYSTOSCOPY  Patient location during evaluation: PACU Anesthesia Type: General Level of consciousness: awake and alert Pain management: pain level controlled Vital Signs Assessment: post-procedure vital signs reviewed and stable Respiratory status: spontaneous breathing, nonlabored ventilation, respiratory function stable and patient connected to nasal cannula oxygen Cardiovascular status: blood pressure returned to baseline and stable Postop Assessment: no apparent nausea or vomiting Anesthetic complications: no     Last Vitals:  Vitals:   04/12/18 1310 04/12/18 1315  BP: 126/78   Pulse: 62 64  Resp: (!) 9 12  Temp:    SpO2: 97% 100%    Last Pain:  Vitals:   04/12/18 1315  TempSrc:   PainSc: 6                  Molli Barrows

## 2018-04-12 NOTE — Op Note (Signed)
Operative Report:  PRE-OP DIAGNOSIS: FIBROID UTERUS, MENORRHAGIA   POST-OP DIAGNOSIS: FIBROID UTERUS, MENORRHAGIA   PROCEDURE: Procedure(s): HYSTERECTOMY TOTAL LAPAROSCOPIC BILATERAL SALPINGECTOMY CYSTOSCOPY  SURGEON: Barnett Applebaum, MD, FACOG  ASSISTANT: Dr Georgianne Fick;   No other capable assistant available, in surgery requiring high level assistant.  ANESTHESIA: General endotracheal anesthesia  ESTIMATED BLOOD LOSS: 30 mL  SPECIMENS: Uterus, Tubes.  COMPLICATIONS: None  DISPOSITION: stable to PACU  FINDINGS: Intraabdominal adhesions were not noted. Multiple fibroids seen, one large subserosal pedunculated fundal fibroid  PROCEDURE:  The patient was taken to the OR where anesthesia was administed. She was prepped and draped in the normal sterile fashion in the dorsal lithotomy position in the Prien stirrups. A time out was performed. A Graves speculum was inserted, the cervix was grasped with a single tooth tenaculum and the endometrial cavity was sounded. The cervix was progressively dilated to a size 18 [place] with [employer] dilators. A V-Care uterine manipulator was inserted in the usual fashion without incident. Gloves were changed and attention was turned to the abdomen.   An infraumbilical transverse 51mm skin incision was made with the scalpel after local anesthesia applied to the skin. A Veress-step needle was inserted in the usual fashion and confirmed using the hanging drop technique. A pneumoperitoneum was obtained by insufflation of CO2 (opening pressure of 58mmHg) to 61mmHg. A diagnostic laparoscopy was performed yielding the previously described findings. Attention was turned to the left lower quadrant where after visualization of the inferior epigastric vessels a 32mm skin incision was made with the scalpel. A 5 mm laparoscopic port was inserted. The same procedure was repeated in the right lower quadrant with a 50mm trocar. Attention was turned to the left aspect of the uterus,  where after visualization of the ureter, the round ligament was coagulated and transected using the 32mm Harmonic Scapel. The anterior and posterior leafs of the broad ligament were dissected off as the anterior one was coagulated and transected in a caudal direction towards the cuff of the uterine manipulator.  Attention was then turned to the left fallopian tube which was recognized by visualization of the fimbria. The tube is excised to its attachment to the uterus. The uterine-ovarian ligament and its blood vessels were carefully coagulated and transected using the Harmonic scapel.  Attention was turned to the right aspect of the uterus where the same procedure was performed.  The vesicouterine reflection of the peritoneum was dissected with the harmonic scapel and the bladder flap was created bluntly.  The uterine vessels were coagulated and transected bilaterally using first bipolar cautery and then the harmonic scapel. A 360 degree, circumferential colpotomy was done to completely amputate the uterus with cervix and tubes. Once the specimen was amputated it was delivered through the vagina.   The colpotomy was repaired in a simple running fashion using a delayed absorbable suture with an endo-stitch device.  Vaginal exam confirms complete closure.  The cavity was copiously irrigated. A survey of the pelvic cavity revealed adequate hemostasis and no injury to bowel, bladder, or ureter.   A diagnostic cystoscopy was performed using saline distension of bladder with no lesions or injuries noted.  Bilateral urine flow from each ureteral orifice is visualized.  At this point the procedure was finalized. Right lower quadrant fascia incision is closed with a vicryl suture using the fascia closure device. All the instruments were removed from the patient's body. Gas was expelled and patient is leveled.  Incisions are closed with skin adhesive.    Patient  goes to recovery room in stable condition.  All sponge,  instrument, and needle counts are correct x2.     Barnett Applebaum, MD, [facility], [facility] 04/12/2018  11:46 AM

## 2018-04-12 NOTE — Anesthesia Procedure Notes (Signed)
Procedure Name: Intubation Date/Time: 04/12/2018 10:12 AM Performed by: Dawayne Cirri I, CRNA Pre-anesthesia Checklist: Patient identified, Patient being monitored, Timeout performed, Emergency Drugs available and Suction available Patient Re-evaluated:Patient Re-evaluated prior to induction Oxygen Delivery Method: Circle system utilized Preoxygenation: Pre-oxygenation with 100% oxygen Induction Type: IV induction Ventilation: Mask ventilation without difficulty Laryngoscope Size: 3 and McGraph Grade View: Grade I Tube type: Oral Tube size: 7.0 mm Number of attempts: 1 Airway Equipment and Method: Stylet Placement Confirmation: ETT inserted through vocal cords under direct vision,  positive ETCO2 and breath sounds checked- equal and bilateral Secured at: 21 cm Tube secured with: Tape Dental Injury: Teeth and Oropharynx as per pre-operative assessment

## 2018-04-12 NOTE — Anesthesia Preprocedure Evaluation (Signed)
Anesthesia Evaluation  Patient identified by MRN, date of birth, ID band Patient awake    Reviewed: Allergy & Precautions, H&P , NPO status , Patient's Chart, lab work & pertinent test results, reviewed documented beta blocker date and time   Airway Mallampati: II  TM Distance: >3 FB Neck ROM: full    Dental  (+) Teeth Intact   Pulmonary neg pulmonary ROS,    Pulmonary exam normal        Cardiovascular Exercise Tolerance: Good hypertension, On Medications negative cardio ROS Normal cardiovascular exam Rhythm:regular Rate:Normal     Neuro/Psych  Headaches, negative neurological ROS  negative psych ROS   GI/Hepatic negative GI ROS, Neg liver ROS,   Endo/Other  negative endocrine ROSdiabetes, Well Controlled, Type 2, Oral Hypoglycemic AgentsMorbid obesity  Renal/GU negative Renal ROS  negative genitourinary   Musculoskeletal   Abdominal   Peds  Hematology negative hematology ROS (+) anemia ,   Anesthesia Other Findings Past Medical History: No date: Anemia No date: Diabetes mellitus without complication ([facility]) No date: Hypertension No date: Obesity Past Surgical History: 08/2011: BREAST SURGERY     Comment:  breast reduction No date: FOOT SURGERY; Left     Comment:  cyst removed No date: TUBAL LIGATION BMI    Body Mass Index:  44.06 kg/m     Reproductive/Obstetrics negative OB ROS                             Anesthesia Physical Anesthesia Plan  ASA: II  Anesthesia Plan: General ETT   Post-op Pain Management:    Induction:   PONV Risk Score and Plan:   Airway Management Planned:   Additional Equipment:   Intra-op Plan:   Post-operative Plan:   Informed Consent: I have reviewed the patients History and Physical, chart, labs and discussed the procedure including the risks, benefits and alternatives for the proposed anesthesia with the patient or authorized  representative who has indicated his/her understanding and acceptance.   Dental Advisory Given  Plan Discussed with: CRNA  Anesthesia Plan Comments:         Anesthesia Quick Evaluation

## 2018-04-13 ENCOUNTER — Encounter: Payer: Self-pay | Admitting: Obstetrics & Gynecology

## 2018-04-13 LAB — SURGICAL PATHOLOGY

## 2018-04-17 ENCOUNTER — Telehealth: Payer: Self-pay | Admitting: Obstetrics & Gynecology

## 2018-04-17 NOTE — Telephone Encounter (Signed)
Is there any thing else you could call in ? Or any other advice I could give?

## 2018-04-17 NOTE — Telephone Encounter (Signed)
Pt aware.

## 2018-04-17 NOTE — Telephone Encounter (Signed)
1. Dulcolax oral or suppository for constipation.  Surgery was 5 days ago and this is common. 2. Percocet can add to constipation. Try to use Alleve or Advil instead.

## 2018-04-17 NOTE — Telephone Encounter (Signed)
Patient is calling about having post op pain and unable to have Bowel movement. Patient report she has taken Colace and hasn't had any relief. Please advise . Patient is schedule Thursday, May 15 at 1:50

## 2018-04-19 ENCOUNTER — Encounter: Payer: Self-pay | Admitting: Obstetrics & Gynecology

## 2018-04-19 ENCOUNTER — Ambulatory Visit (INDEPENDENT_AMBULATORY_CARE_PROVIDER_SITE_OTHER): Payer: Commercial Managed Care - PPO | Admitting: Obstetrics & Gynecology

## 2018-04-19 VITALS — BP 140/90 | Ht 66.0 in | Wt 270.0 lb

## 2018-04-19 DIAGNOSIS — D219 Benign neoplasm of connective and other soft tissue, unspecified: Secondary | ICD-10-CM

## 2018-04-19 NOTE — Progress Notes (Signed)
  Postoperative Follow-up Patient presents post op from [facility] for fibroids, 1 week ago. Pathology: DIAGNOSIS:  A. UTERUS WITH CERVIX AND BILATERAL FALLOPIAN TUBES; TOTAL HYSTERECTOMY  WITH BILATERAL SALPINGECTOMY:  - CHRONIC CERVICITIS WITH NABOTHIAN CYSTS.  - ENDOMETRIAL POLYP MEASURING 0.5 CM.  - ENDOMETRIUM WITH SECRETORY PATTERN OF PROGESTIN EFFECT.  - MYOMETRIUM WITH ADENOMYOSIS AND SUBSEROSAL, SUBMUCOSAL, AND INTRAMURAL  LEIOMYOMATA LARGEST MEASURING 3 CM.  - RIGHT FALLOPIAN TUBE WITH BENIGN PARATUBAL CYST AND FIMBRIATED END;  STATUS POST TUBAL LIGATION.  - LEFT FALLOPIAN TUBE WITH FIMBRIATED END; STATUS POST TUBAL LIGATION.  - NEGATIVE FOR ATYPIA AND MALIGNANCY.  Subjective: Patient reports some improvement in her preop symptoms. Eating a regular diet without difficulty. Pain is controlled with current analgesics. Medications being used: ibuprofen (OTC).  Activity: physically active is minimal right now. Patient reports vaginal sx's of None  Objective: There were no vitals taken for this visit. Physical Exam  Constitutional: She is oriented to person, place, and time. She appears well-developed and well-nourished. No distress.  Cardiovascular: Normal rate.  Pulmonary/Chest: Effort normal.  Abdominal: Soft. She exhibits no distension. There is no tenderness.  Incision Healing Well   Musculoskeletal: Normal range of motion.  Neurological: She is alert and oriented to person, place, and time. No cranial nerve deficit.  Skin: Skin is warm and dry.  Psychiatric: She has a normal mood and affect.   Assessment: s/p :  total laparoscopic hysterectomy with bilateral salpingectomy progressing well  Plan: Patient has done well after surgery with no apparent complications.  I have discussed the post-operative course to date, and the expected progress moving forward.  The patient understands what complications to be concerned about.  I will see the patient in routine follow up, or  sooner if needed.    Activity plan: No heavy lifting. Pelvic rest.  Hoyt Koch 04/19/2018, 1:20 PM

## 2018-04-25 ENCOUNTER — Ambulatory Visit: Payer: Commercial Managed Care - PPO | Admitting: Obstetrics & Gynecology

## 2018-05-04 ENCOUNTER — Telehealth: Payer: Self-pay

## 2018-05-04 ENCOUNTER — Encounter: Payer: Self-pay | Admitting: Obstetrics & Gynecology

## 2018-05-04 NOTE — Telephone Encounter (Signed)
Pt requesting not for return to work 6/3, post op patient of [place]. States her husband will pick it up today when ready, please let pt know once note ready. Thank you

## 2018-05-04 NOTE — Telephone Encounter (Signed)
Done

## 2018-05-08 DIAGNOSIS — Z90710 Acquired absence of both cervix and uterus: Secondary | ICD-10-CM | POA: Insufficient documentation

## 2018-05-08 DIAGNOSIS — D509 Iron deficiency anemia, unspecified: Secondary | ICD-10-CM | POA: Insufficient documentation

## 2018-05-14 ENCOUNTER — Other Ambulatory Visit: Payer: Self-pay

## 2018-05-14 ENCOUNTER — Emergency Department (HOSPITAL_COMMUNITY)
Admission: EM | Admit: 2018-05-14 | Discharge: 2018-05-15 | Disposition: A | Discharge status: Home / Self Care | Payer: Commercial Managed Care - PPO | Attending: Emergency Medicine | Admitting: Emergency Medicine

## 2018-05-14 ENCOUNTER — Encounter (HOSPITAL_COMMUNITY): Payer: Self-pay | Admitting: Emergency Medicine

## 2018-05-14 DIAGNOSIS — R2243 Localized swelling, mass and lump, lower limb, bilateral: Secondary | ICD-10-CM | POA: Insufficient documentation

## 2018-05-14 DIAGNOSIS — Z5321 Procedure and treatment not carried out due to patient leaving prior to being seen by health care provider: Secondary | ICD-10-CM | POA: Insufficient documentation

## 2018-05-14 LAB — CBC
HEMATOCRIT: 36.8 % (ref 36.0–46.0)
HEMOGLOBIN: 11.4 g/dL — AB (ref 12.0–15.0)
MCH: 23 pg — ABNORMAL LOW (ref 26.0–34.0)
MCHC: 31 g/dL (ref 30.0–36.0)
MCV: 74.3 fL — ABNORMAL LOW (ref 78.0–100.0)
Platelets: 206 10*3/uL (ref 150–400)
RBC: 4.95 MIL/uL (ref 3.87–5.11)
RDW: 16.6 % — ABNORMAL HIGH (ref 11.5–15.5)
WBC: 5.4 10*3/uL (ref 4.0–10.5)

## 2018-05-14 LAB — BASIC METABOLIC PANEL
ANION GAP: 7 (ref 5–15)
BUN: 11 mg/dL (ref 6–20)
CHLORIDE: 106 mmol/L (ref 101–111)
CO2: 26 mmol/L (ref 22–32)
CREATININE: 1.05 mg/dL — AB (ref 0.44–1.00)
Calcium: 8.6 mg/dL — ABNORMAL LOW (ref 8.9–10.3)
GFR calc non Af Amer: >60 mL/min (ref >60)
Glucose, Bld: 130 mg/dL — ABNORMAL HIGH (ref 65–99)
POTASSIUM: 3.9 mmol/L (ref 3.5–5.1)
SODIUM: 139 mmol/L (ref 135–145)

## 2018-05-14 NOTE — ED Notes (Signed)
Pt came to nurse 1st stating she was leaving

## 2018-05-14 NOTE — ED Triage Notes (Signed)
Pt reports L leg swelling X few days with redness and warmth that is now spreading to R leg. Pt concerned for blood clot, PCP has already sent over order for [place] R/O DVT.

## 2018-05-15 NOTE — ED Notes (Signed)
Follow up call made  Is waiting for [place] out pt to be set up  05/15/18  1049 s Lealand Elting rn

## 2018-06-10 ENCOUNTER — Encounter (HOSPITAL_COMMUNITY): Payer: Self-pay | Admitting: Emergency Medicine

## 2018-06-10 ENCOUNTER — Emergency Department (HOSPITAL_BASED_OUTPATIENT_CLINIC_OR_DEPARTMENT_OTHER): Payer: Commercial Managed Care - PPO

## 2018-06-10 ENCOUNTER — Emergency Department (HOSPITAL_COMMUNITY)
Admission: EM | Admit: 2018-06-10 | Discharge: 2018-06-10 | Disposition: A | Discharge status: Home / Self Care | Payer: Commercial Managed Care - PPO | Attending: Emergency Medicine | Admitting: Emergency Medicine

## 2018-06-10 DIAGNOSIS — E876 Hypokalemia: Secondary | ICD-10-CM | POA: Diagnosis not present

## 2018-06-10 DIAGNOSIS — M79609 Pain in unspecified limb: Secondary | ICD-10-CM | POA: Diagnosis not present

## 2018-06-10 DIAGNOSIS — I10 Essential (primary) hypertension: Secondary | ICD-10-CM | POA: Diagnosis not present

## 2018-06-10 DIAGNOSIS — Z7984 Long term (current) use of oral hypoglycemic drugs: Secondary | ICD-10-CM | POA: Insufficient documentation

## 2018-06-10 DIAGNOSIS — M7989 Other specified soft tissue disorders: Secondary | ICD-10-CM

## 2018-06-10 DIAGNOSIS — E119 Type 2 diabetes mellitus without complications: Secondary | ICD-10-CM | POA: Diagnosis not present

## 2018-06-10 DIAGNOSIS — R600 Localized edema: Secondary | ICD-10-CM | POA: Diagnosis present

## 2018-06-10 DIAGNOSIS — R609 Edema, unspecified: Secondary | ICD-10-CM

## 2018-06-10 DIAGNOSIS — Z79899 Other long term (current) drug therapy: Secondary | ICD-10-CM | POA: Insufficient documentation

## 2018-06-10 LAB — CBC WITH DIFFERENTIAL/PLATELET
ABS IMMATURE GRANULOCYTES: 0 10*3/uL (ref 0.0–0.1)
Basophils Absolute: 0 10*3/uL (ref 0.0–0.1)
Basophils Relative: 0 %
Eosinophils Absolute: 0.2 10*3/uL (ref 0.0–0.7)
Eosinophils Relative: 4 %
HEMATOCRIT: 40.3 % (ref 36.0–46.0)
Hemoglobin: 12.3 g/dL (ref 12.0–15.0)
IMMATURE GRANULOCYTES: 0 %
LYMPHS ABS: 1.7 10*3/uL (ref 0.7–4.0)
Lymphocytes Relative: 36 %
MCH: 23 pg — ABNORMAL LOW (ref 26.0–34.0)
MCHC: 30.5 g/dL (ref 30.0–36.0)
MCV: 75.3 fL — AB (ref 78.0–100.0)
MONOS PCT: 10 %
Monocytes Absolute: 0.5 10*3/uL (ref 0.1–1.0)
NEUTROS ABS: 2.4 10*3/uL (ref 1.7–7.7)
NEUTROS PCT: 50 %
Platelets: 197 10*3/uL (ref 150–400)
RBC: 5.35 MIL/uL — AB (ref 3.87–5.11)
RDW: 15.4 % (ref 11.5–15.5)
WBC: 4.8 10*3/uL (ref 4.0–10.5)

## 2018-06-10 LAB — COMPREHENSIVE METABOLIC PANEL
ALT: 18 U/L (ref 0–44)
AST: 17 U/L (ref 15–41)
Albumin: 3.6 g/dL (ref 3.5–5.0)
Alkaline Phosphatase: 52 U/L (ref 38–126)
Anion gap: 10 (ref 5–15)
BUN: 10 mg/dL (ref 6–20)
CO2: 28 mmol/L (ref 22–32)
Calcium: 9.1 mg/dL (ref 8.9–10.3)
Chloride: 102 mmol/L (ref 98–111)
Creatinine, Ser: 0.94 mg/dL (ref 0.44–1.00)
Glucose, Bld: 135 mg/dL — ABNORMAL HIGH (ref 70–99)
POTASSIUM: 3.4 mmol/L — AB (ref 3.5–5.1)
Sodium: 140 mmol/L (ref 135–145)
Total Bilirubin: 0.9 mg/dL (ref 0.3–1.2)
Total Protein: 7 g/dL (ref 6.5–8.1)

## 2018-06-10 LAB — BRAIN NATRIURETIC PEPTIDE: B Natriuretic Peptide: 23.1 pg/mL (ref 0.0–100.0)

## 2018-06-10 MED ORDER — TRAMADOL HCL 50 MG PO TABS: 50.0000 mg | ORAL_TABLET | Freq: Four times a day (QID) | ORAL | 0 refills | Status: DC | PRN

## 2018-06-10 MED ORDER — POTASSIUM CHLORIDE CRYS ER 20 MEQ PO TBCR: 40.0000 meq | EXTENDED_RELEASE_TABLET | Freq: Once | ORAL | 0 refills | Status: DC

## 2018-06-10 MED ORDER — POTASSIUM CHLORIDE CRYS ER 20 MEQ PO TBCR
40.0000 meq | EXTENDED_RELEASE_TABLET | Freq: Once | ORAL | Status: AC
  Administered 2018-06-10: 40 meq via ORAL
  Filled 2018-06-10: qty 2

## 2018-06-10 NOTE — ED Provider Notes (Signed)
[facility] EMERGENCY DEPARTMENT Provider Note   CSN: [id number] Arrival date & time: 06/10/18  7673     History   Chief Complaint Chief Complaint  Patient presents with  . Leg Swelling    HPI Grace Gallagher is a 47 y.o. female who presents with bilateral leg swelling. PMH significant for chronic leg edema, DM, anemia, HTN, obesity.  The patient states that she has chronic leg swelling.  After her hysterectomy a couple months ago she developed redness of both bilateral legs.  She was seen by her doctor and treated for cellulitis.  She was put on Keflex and this did improve the redness but that did not improve the swelling.  She was advised to come to the emergency department for a DVT study which she did do however left without being seen because of the wait times.  She was not able to follow-up as an outpatient.  She states that over the past couple of days the swelling has gotten much worse.  She does stand on her feet for most the day because she works as a [employer] manager.  She does wear compression stockings at work.  She took a Lasix that she had leftover and this did improve the swelling in her legs however she states that she still has an aching and cramping pain over the medial aspects of her ankles and lower legs.  She was called by representative from the hospital to apologize for her long wait time and she was encouraged to come back to the hospital for an evaluation.  She states this is why she is here today.  She denies chest pain, history of liver or kidney disease.  She does have some chronic shortness of breath which she attributes to being overweight.  HPI  Past Medical History:  Diagnosis Date  . Anemia   . Diabetes mellitus without complication ([place])   . Hypertension   . Obesity     Patient Active Problem List   Diagnosis Date Noted  . Fibroid 04/05/2018  . Lower respiratory infection 07/07/2015  . Bacterial vaginosis 07/07/2015  . Edema 03/26/2015  . Anal  itching 10/14/2014  . Head or neck swelling, mass, or lump 09/25/2014  . Vaginitis and vulvovaginitis 09/10/2014  . Dizziness and giddiness 06/02/2014  . Other malaise and fatigue 05/30/2014  . Acute sinusitis with symptoms > 10 days 04/09/2014  . Essential hypertension, malignant 02/04/2014  . SOB (shortness of breath) 02/04/2014  . Allergic rhinitis 01/29/2014  . Hypokalemia 01/10/2014  . Back pain 01/06/2014  . Bilateral lower abdominal cramping 08/29/2013  . Menorrhagia 08/29/2013  . Encounter for routine gynecological examination 07/01/2013  . Headache(784.0) 03/31/2013  . HTN (hypertension) 02/21/2013  . Obesity, unspecified 02/21/2013  . Microcytic anemia 02/21/2013    Past Surgical History:  Procedure Laterality Date  . ABDOMINAL HYSTERECTOMY    . BREAST SURGERY  08/2011   breast reduction  . CYSTOSCOPY  04/12/2018   Procedure: CYSTOSCOPY;  Surgeon: Gae Dry, MD;  Location: [facility] ORS;  Service: Gynecology;;  . FOOT SURGERY Left    cyst removed  . LAPAROSCOPIC HYSTERECTOMY Bilateral 04/12/2018   Procedure: HYSTERECTOMY TOTAL LAPAROSCOPIC BILATERAL SALPINGECTOMY;  Surgeon: Gae Dry, MD;  Location: [facility] ORS;  Service: Gynecology;  Laterality: Bilateral;  . TUBAL LIGATION       OB History    Gravida  2   Para  2   Term  0   Preterm  2   AB  Living  2     SAB      TAB      Ectopic      Multiple      Live Births  2            Home Medications    Prior to Admission medications   Medication Sig Start Date End Date Taking? Authorizing Provider  amLODipine (NORVASC) 10 MG tablet Take 1 tablet (10 mg total) by mouth daily. 09/10/14   Lucille Passy, MD  Aspirin-Caffeine [facility] FAST PAIN RELIEF PO) Take 1 packet by mouth daily as needed (headaches).    [provider]  atorvastatin (LIPITOR) 20 MG tablet Take 20 mg by mouth daily. 10/09/17   [provider]  ferrous sulfate 325 (65 FE) MG tablet Take 1 tablet (325 mg total)  by mouth daily. Patient taking differently: Take 325 mg by mouth 2 (two) times daily.  03/10/18   Keitha Butte, CNM  hydrochlorothiazide (HYDRODIURIL) 25 MG tablet Take 1 tablet (25 mg total) by mouth daily. Patient taking differently: Take 25 mg by mouth at bedtime.  07/07/15   Jaynee Eagles, PA-C  metFORMIN (GLUCOPHAGE) 1000 MG tablet Take 1,000 mg by mouth 2 (two) times daily with a meal.    [provider]  metoprolol succinate (TOPROL-XL) 50 MG 24 hr tablet Take 50 mg by mouth daily.  06/01/16   [provider]  oxyCODONE-acetaminophen (PERCOCET/ROXICET) 5-325 MG tablet Take 1 tablet by mouth every 3 (three) hours as needed for moderate pain. 04/12/18   Gae Dry, MD  ramipril (ALTACE) 10 MG capsule Take 10 mg by mouth at bedtime.  10/10/17   [provider]    Family History Family History  Problem Relation Age of Onset  . Hypertension Mother   . Heart failure Mother   . Heart attack Mother   . Cancer Father     Social History Social History   Tobacco Use  . Smoking status: Never Smoker  . Smokeless tobacco: Never Used  Substance Use Topics  . Alcohol use: No  . Drug use: No     Allergies   Hydrocodone   Review of Systems Review of Systems  Respiratory: Positive for shortness of breath (Chronic).   Cardiovascular: Positive for leg swelling. Negative for chest pain.  Musculoskeletal: Positive for myalgias.  All other systems reviewed and are negative.    Physical Exam Updated Vital Signs BP (!) 137/93 (BP Location: Right Arm)   Pulse 72   Temp 97.6 F (36.4 C) (Oral)   Resp 20   LMP 02/21/2018   SpO2 98%   Physical Exam  Constitutional: She is oriented to person, place, and time. She appears well-developed and well-nourished. No distress.  Calm and cooperative  HENT:  Head: Normocephalic and atraumatic.  Eyes: Pupils are equal, round, and reactive to light. Conjunctivae are normal. Right eye exhibits no discharge. Left eye  exhibits no discharge. No scleral icterus.  Neck: Normal range of motion.  Cardiovascular: Normal rate.  Pulmonary/Chest: Effort normal. No respiratory distress.  Abdominal: She exhibits no distension.  Musculoskeletal:  Nonpitting edema of the bilateral ankles.  Legs appear equally swollen bilaterally.  2+ DP pulse bilaterally.  Normal cap refill.  No significant tenderness of the legs.  Neurological: She is alert and oriented to person, place, and time.  Skin: Skin is warm and dry.  Psychiatric: She has a normal mood and affect. Her behavior is normal.  Nursing note and vitals  reviewed.    ED Treatments / Results  Labs (all labs ordered are listed, but only abnormal results are displayed) Labs Reviewed  COMPREHENSIVE METABOLIC PANEL - Abnormal; Notable for the following components:      Result Value   Potassium 3.4 (*)    Glucose, Bld 135 (*)    All other components within normal limits  CBC WITH DIFFERENTIAL/PLATELET - Abnormal; Notable for the following components:   RBC 5.35 (*)    MCV 75.3 (*)    MCH 23.0 (*)    All other components within normal limits  BRAIN NATRIURETIC PEPTIDE    EKG None  Radiology No results found.  Procedures Procedures (including critical care time)  Medications Ordered in ED Medications - No data to display   Initial Impression / Assessment and Plan / ED Course  I have reviewed the triage vital signs and the nursing notes.  Pertinent labs & imaging results that were available during my care of the patient were reviewed by me and considered in my medical decision making (see chart for details).  47 year old female presents with bilateral leg edema which is chronic. She is hypertensive but otherwise vitals are normal. On exam she has mild-mod edema around the ankles. She was sent for DVT study therefore we will obtain this but suspect she has dependent edema. Labs were obtain and LE [place] ordered.  Labs are overall normal other than mild  hypokalemia. DVT study is negative. Discussed with patient. She was given rx for potassium and tramadol for pain and advised PCP follow up.  Final Clinical Impressions(s) / ED Diagnoses   Final diagnoses:  Peripheral edema  Hypokalemia    ED Discharge Orders    None       Recardo Evangelist, PA-C 06/10/18 1548    Valarie Merino, MD 06/10/18 1626

## 2018-06-10 NOTE — Progress Notes (Signed)
VASCULAR LAB PRELIMINARY  PRELIMINARY  PRELIMINARY  PRELIMINARY  Bilateral lower extremity venous duplex completed.    Preliminary report:  There is no DVT or SVT noted in the bilateral lower extremities.  Shawan Tosh, RVT 06/10/2018, 3:16 PM

## 2018-06-10 NOTE — Discharge Instructions (Signed)
Take potassium pill once daily for the next week Take Tramadol as needed for pain Please continue to wear compression stockings and follow up with your doctor

## 2018-06-10 NOTE — ED Triage Notes (Signed)
Pt reports bilateral lower leg and foot swelling and pain that has been persistent since may, states she has furosemide and took one last night and swelling improved.

## 2018-07-16 ENCOUNTER — Emergency Department: Payer: Commercial Managed Care - PPO

## 2018-07-16 ENCOUNTER — Other Ambulatory Visit: Payer: Self-pay

## 2018-07-16 ENCOUNTER — Emergency Department
Admission: EM | Admit: 2018-07-16 | Discharge: 2018-07-16 | Disposition: A | Discharge status: Home / Self Care | Payer: Commercial Managed Care - PPO | Attending: Emergency Medicine | Admitting: Emergency Medicine

## 2018-07-16 ENCOUNTER — Encounter: Payer: Self-pay | Admitting: Emergency Medicine

## 2018-07-16 DIAGNOSIS — R059 Cough, unspecified: Secondary | ICD-10-CM

## 2018-07-16 DIAGNOSIS — R05 Cough: Secondary | ICD-10-CM | POA: Diagnosis not present

## 2018-07-16 DIAGNOSIS — R0602 Shortness of breath: Secondary | ICD-10-CM | POA: Insufficient documentation

## 2018-07-16 DIAGNOSIS — E1165 Type 2 diabetes mellitus with hyperglycemia: Secondary | ICD-10-CM | POA: Insufficient documentation

## 2018-07-16 DIAGNOSIS — Z7984 Long term (current) use of oral hypoglycemic drugs: Secondary | ICD-10-CM | POA: Diagnosis not present

## 2018-07-16 DIAGNOSIS — I10 Essential (primary) hypertension: Secondary | ICD-10-CM | POA: Diagnosis not present

## 2018-07-16 DIAGNOSIS — Z79899 Other long term (current) drug therapy: Secondary | ICD-10-CM | POA: Insufficient documentation

## 2018-07-16 LAB — CBC
HEMATOCRIT: 37.5 % (ref 35.0–47.0)
HEMOGLOBIN: 12 g/dL (ref 12.0–16.0)
MCH: 23.7 pg — ABNORMAL LOW (ref 26.0–34.0)
MCHC: 32.1 g/dL (ref 32.0–36.0)
MCV: 73.8 fL — AB (ref 80.0–100.0)
Platelets: 156 10*3/uL (ref 150–440)
RBC: 5.08 MIL/uL (ref 3.80–5.20)
RDW: 14.7 % — AB (ref 11.5–14.5)
WBC: 5.4 10*3/uL (ref 3.6–11.0)

## 2018-07-16 LAB — BASIC METABOLIC PANEL
ANION GAP: 6 (ref 5–15)
BUN: 14 mg/dL (ref 6–20)
CO2: 28 mmol/L (ref 22–32)
Calcium: 8.9 mg/dL (ref 8.9–10.3)
Chloride: 101 mmol/L (ref 98–111)
Creatinine, Ser: 0.94 mg/dL (ref 0.44–1.00)
Glucose, Bld: 424 mg/dL — ABNORMAL HIGH (ref 70–99)
POTASSIUM: 3.4 mmol/L — AB (ref 3.5–5.1)
SODIUM: 135 mmol/L (ref 135–145)

## 2018-07-16 LAB — URINALYSIS, COMPLETE (UACMP) WITH MICROSCOPIC
BILIRUBIN URINE: NEGATIVE
Glucose, UA: >=500 mg/dL — AB
HGB URINE DIPSTICK: NEGATIVE
KETONES UR: 5 mg/dL — AB
LEUKOCYTES UA: NEGATIVE
NITRITE: NEGATIVE
Protein, ur: NEGATIVE mg/dL
SPECIFIC GRAVITY, URINE: 1.039 — AB (ref 1.005–1.030)
pH: 5 (ref 5.0–8.0)

## 2018-07-16 LAB — GLUCOSE, CAPILLARY
GLUCOSE-CAPILLARY: 404 mg/dL — AB (ref 70–99)
GLUCOSE-CAPILLARY: 294 mg/dL — AB (ref 70–99)
Glucose-Capillary: 378 mg/dL — ABNORMAL HIGH (ref 70–99)

## 2018-07-16 LAB — BRAIN NATRIURETIC PEPTIDE: B NATRIURETIC PEPTIDE 5: 13 pg/mL (ref 0.0–100.0)

## 2018-07-16 MED ORDER — SODIUM CHLORIDE 0.9 % IV BOLUS: 1000.0000 mL | Freq: Once | INTRAVENOUS | Status: DC

## 2018-07-16 MED ORDER — INSULIN ASPART 100 UNIT/ML ~~LOC~~ SOLN
5.0000 [IU] | Freq: Once | SUBCUTANEOUS | Status: AC
  Administered 2018-07-16: 5 [IU] via SUBCUTANEOUS
  Filled 2018-07-16: qty 1

## 2018-07-16 NOTE — ED Provider Notes (Signed)
[facility] Emergency Department Provider Note   ____________________________________________   First MD Initiated Contact with Patient 07/16/18 1637     (approximate)  I have reviewed the triage vital signs and the nursing notes.   HISTORY  Chief Complaint Cough and Hyperglycemia  HPI Grace Gallagher is a 47 y.o. female reports she is had 2 concerns, one is she has been having a cough for about 3 months now.  She reports that for 3 months now she is been having some dry mainly nonproductive cough.  No shortness of breath or chest pain with it.  She reports that seems to be just slowly getting a little bit worse every week.  A little bit worse sometimes in the mornings and tends to clear up when she gets up and walks.  No leg swelling, except she does have a history of some edema in the past for which she occasionally to his Lasix to help cleared up she attributes that to being up on her feet and with her job.  In reviewing her medications, asked her about her rammer Pearl and she does report that she started taking that a few months ago and its roughly time she started getting a cough.  In addition she reports that she is noticed for the last few weeks that her blood sugars been elevated, she is had blood sugars up over 300-400 and she is been urinating quite a bit more the last couple of days.  She is to be on insulin, wonders if she might need to go back on it but is currently taking metformin 500 mg twice a day.  She is to take at thousand milligrams twice a day, but she is not sure why they had reduced it probably when they had her on insulin briefly but she does not take insulin anymore.   Past Medical History:  Diagnosis Date  . Anemia   . Diabetes mellitus without complication ([place])   . Hypertension   . Obesity     Patient Active Problem List   Diagnosis Date Noted  . Fibroid 04/05/2018  . Lower respiratory infection 07/07/2015  . Bacterial  vaginosis 07/07/2015  . Edema 03/26/2015  . Anal itching 10/14/2014  . Head or neck swelling, mass, or lump 09/25/2014  . Vaginitis and vulvovaginitis 09/10/2014  . Dizziness and giddiness 06/02/2014  . Other malaise and fatigue 05/30/2014  . Acute sinusitis with symptoms > 10 days 04/09/2014  . Essential hypertension, malignant 02/04/2014  . SOB (shortness of breath) 02/04/2014  . Allergic rhinitis 01/29/2014  . Hypokalemia 01/10/2014  . Back pain 01/06/2014  . Bilateral lower abdominal cramping 08/29/2013  . Menorrhagia 08/29/2013  . Encounter for routine gynecological examination 07/01/2013  . Headache(784.0) 03/31/2013  . HTN (hypertension) 02/21/2013  . Obesity, unspecified 02/21/2013  . Microcytic anemia 02/21/2013    Past Surgical History:  Procedure Laterality Date  . ABDOMINAL HYSTERECTOMY    . BREAST SURGERY  08/2011   breast reduction  . CYSTOSCOPY  04/12/2018   Procedure: CYSTOSCOPY;  Surgeon: Gae Dry, MD;  Location: [facility] ORS;  Service: Gynecology;;  . FOOT SURGERY Left    cyst removed  . LAPAROSCOPIC HYSTERECTOMY Bilateral 04/12/2018   Procedure: HYSTERECTOMY TOTAL LAPAROSCOPIC BILATERAL SALPINGECTOMY;  Surgeon: Gae Dry, MD;  Location: [facility] ORS;  Service: Gynecology;  Laterality: Bilateral;  . TUBAL LIGATION      Prior to Admission medications   Medication Sig Start Date End Date Taking? Authorizing Provider  albuterol (PROVENTIL HFA;VENTOLIN HFA) 108 (90 Base) MCG/ACT inhaler Inhale 2 puffs into the lungs every 4 (four) hours as needed for wheezing or shortness of breath.   Yes [provider]  amLODipine (NORVASC) 10 MG tablet Take 1 tablet (10 mg total) by mouth daily. 09/10/14  Yes Lucille Passy, MD  atorvastatin (LIPITOR) 20 MG tablet Take 20 mg by mouth daily. 10/09/17  Yes [provider]  ferrous sulfate 325 (65 FE) MG tablet Take 1 tablet (325 mg total) by mouth daily. Patient taking differently: Take 325 mg by mouth 2  (two) times daily.  03/10/18  Yes Keitha Butte, CNM  furosemide (LASIX) 40 MG tablet Take 40 mg by mouth daily. 05/14/18  Yes [provider]  hydrochlorothiazide (HYDRODIURIL) 25 MG tablet Take 1 tablet (25 mg total) by mouth daily. Patient taking differently: Take 25 mg by mouth at bedtime.  07/07/15  Yes Jaynee Eagles, PA-C  metFORMIN (GLUCOPHAGE) 1000 MG tablet Take 1,000 mg by mouth 2 (two) times daily with a meal.   Yes [provider]  metoprolol succinate (TOPROL-XL) 50 MG 24 hr tablet Take 50 mg by mouth daily.  06/01/16  Yes [provider]  ramipril (ALTACE) 10 MG capsule Take 10 mg by mouth at bedtime.  10/10/17  Yes [provider]  potassium chloride SA (K-DUR,KLOR-CON) 20 MEQ tablet Take 2 tablets (40 mEq total) by mouth once for 1 dose. 06/10/18 06/10/18  Recardo Evangelist, PA-C  traMADol (ULTRAM) 50 MG tablet Take 1 tablet (50 mg total) by mouth every 6 (six) hours as needed. 06/10/18   Recardo Evangelist, PA-C    Allergies Hydrocodone  Family History  Problem Relation Age of Onset  . Hypertension Mother   . Heart failure Mother   . Heart attack Mother   . Cancer Father     Social History Social History   Tobacco Use  . Smoking status: Never Smoker  . Smokeless tobacco: Never Used  Substance Use Topics  . Alcohol use: No  . Drug use: No    Review of Systems Constitutional: No fever/chills Eyes: No visual changes. ENT: No sore throat. Cardiovascular: Denies chest pain. Respiratory: Denies shortness of breath. Gastrointestinal: No abdominal pain.  No nausea, no vomiting.  No diarrhea.  No constipation. Genitourinary: Negative for dysuria. Musculoskeletal: Negative for back pain. Skin: Negative for rash. Neurological: Negative for headaches, focal weakness or numbness.    ____________________________________________   PHYSICAL EXAM:  VITAL SIGNS: ED Triage Vitals [07/16/18 1346]  Enc Vitals Group     BP (!) 152/97      Pulse Rate 76     Resp 16     Temp 98.4 F (36.9 C)     Temp Source Oral     SpO2 99 %     Weight 173 lb (78.5 kg)     Height 5\' 6"  (1.676 m)     Head Circumference      Peak Flow      Pain Score 0     Pain Loc      Pain Edu?      Excl. in [place]?     Constitutional: Alert and oriented. Well appearing and in no acute distress.  Very pleasant and in no distress. Eyes: Conjunctivae are normal. Head: Atraumatic. Nose: No congestion/rhinnorhea. Mouth/Throat: Mucous membranes are moist. Neck: No stridor.   Cardiovascular: Normal rate, regular rhythm. Grossly normal heart sounds.  Good peripheral circulation. Respiratory: Normal respiratory effort.  No  retractions. Lungs CTAB.  Speaks in full and clear sentences.  Occasional dry cough. Gastrointestinal: Soft and nontender. No distention. Musculoskeletal: No lower extremity tenderness nor edema. Neurologic:  Normal speech and language. No gross focal neurologic deficits are appreciated.  Skin:  Skin is warm, dry and intact. No rash noted. Psychiatric: Mood and affect are normal. Speech and behavior are normal.  ____________________________________________   LABS (all labs ordered are listed, but only abnormal results are displayed)  Labs Reviewed  BASIC METABOLIC PANEL - Abnormal; Notable for the following components:      Result Value   Potassium 3.4 (*)    Glucose, Bld 424 (*)    All other components within normal limits  CBC - Abnormal; Notable for the following components:   MCV 73.8 (*)    MCH 23.7 (*)    RDW 14.7 (*)    All other components within normal limits  URINALYSIS, COMPLETE (UACMP) WITH MICROSCOPIC - Abnormal; Notable for the following components:   Color, Urine YELLOW (*)    APPearance HAZY (*)    Specific Gravity, Urine 1.039 (*)    Glucose, UA >=500 (*)    Ketones, ur 5 (*)    Bacteria, UA RARE (*)    All other components within normal limits  GLUCOSE, CAPILLARY - Abnormal; Notable for the following  components:   Glucose-Capillary 404 (*)    All other components within normal limits  GLUCOSE, CAPILLARY - Abnormal; Notable for the following components:   Glucose-Capillary 378 (*)    All other components within normal limits  GLUCOSE, CAPILLARY - Abnormal; Notable for the following components:   Glucose-Capillary 294 (*)    All other components within normal limits  URINE CULTURE  BRAIN NATRIURETIC PEPTIDE  CBG MONITORING, ED  CBG MONITORING, ED  CBG MONITORING, ED  CBG MONITORING, ED  CBG MONITORING, ED  CBG MONITORING, ED  CBG MONITORING, ED  CBG MONITORING, ED  CBG MONITORING, ED  CBG MONITORING, ED  CBG MONITORING, ED  CBG MONITORING, ED  CBG MONITORING, ED  CBG MONITORING, ED  CBG MONITORING, ED  CBG MONITORING, ED   ____________________________________________  EKG  Reviewed and entered by me at 1710 Heart rate 70 QRS 100 QTc 460 Normal sinus rhythm, no evidence of acute ischemia ____________________________________________  RADIOLOGY  Dg Chest 2 View  Result Date: 07/16/2018 CLINICAL DATA:  Chronic cough EXAM: CHEST - 2 VIEW COMPARISON:  02/05/2018 FINDINGS: Linear atelectasis in the left mid lung and bases. No acute consolidation or effusion. Normal heart size. No pneumothorax. IMPRESSION: No active cardiopulmonary disease. Subsegmental atelectasis in the left mid lung and bilateral lung bases. Electronically Signed   By: Donavan Foil M.D.   On: 07/16/2018 16:57    Chest x-ray reviewed, negative for acute. ____________________________________________   PROCEDURES  Procedure(s) performed: None  Procedures  Critical Care performed: No  ____________________________________________   INITIAL IMPRESSION / ASSESSMENT AND PLAN / ED COURSE  Pertinent labs & imaging results that were available during my care of the patient were reviewed by me and considered in my medical decision making (see chart for details).  1) cough, afebrile with reassuring  nation and clinical history.  No chest pain.  No pleuritic discomfort.  No hypoxia or tachycardia.  No noted risk factors for pulmonary embolism. PERC negative.      Pulmonary Embolism Rule-out Criteria (PERC rule)  If YES to ANY of the following, the PERC rule is not satisfied and cannot be used to rule out PE in this patient (consider d-dimer or imaging depending on pre-test probability).                      If NO to ALL of the following, AND the clinician's pre-test probability is <15%, the [facility] rule is satisfied and there is no need for further workup (including no need to obtain a d-dimer) as the post-test probability of pulmonary embolism is <2%.                      Mnemonic is HAD CLOTS   H - hormone use (exogenous estrogen)      No. A - age > 50                                                 No. D - DVT/PE history                                      No.   C - coughing blood (hemoptysis)                 No. L - leg swelling, unilateral                             No. O - O2 Sat on Room Air < 95%                  No. T - tachycardia (HR ? 100)                         No. S - surgery or trauma, recent                      No.   Based on my evaluation of the patient, including application of this decision instrument, further testing to evaluate for pulmonary embolism is not indicated at this time.   No chest pain.  Reassuring and normal troponin.  EKG reassuring.  Discussed with the patient, chronic bronchitis could present similar symptoms, but also ramipril which she is been on may also be a potential etiology.  Discussed with the patient she will call her primary care doctor tomorrow to discuss potentially tapering off or changing antihypertensives to see if that will help her cough.  2.  Elevated blood sugar.  Patient known type II diabetic, reports she has had elevated glucose and now some polyuria.  No evidence of infection.  We will give insulin here to  bring her blood sugar down, and also it appears that there would be room to uptitrate her Metformin.  We will have the patient return to 1000 mg twice daily which she is agreeable with, she will also be calling her primary care doctor for close follow-up.  After doses of subcutaneous insulin her blood sugar was able to be brought down to less than 300, patient comfortable plan for discharge and close follow-up.  We will culture the urine as some bacteria but also some squamous cells.  Denies dysuria or abnormal odor.  Return precautions and treatment recommendations and follow-up discussed with the patient who is agreeable with the plan.       ____________________________________________   FINAL CLINICAL IMPRESSION(S) / ED DIAGNOSES  Final diagnoses:  Cough  Type 2 diabetes mellitus with hyperglycemia, without long-term current use of insulin ([place])      NEW MEDICATIONS STARTED DURING THIS VISIT:  New Prescriptions   No medications on file     Note:  This document was prepared using Dragon voice recognition software and may include unintentional dictation errors.     Delman Kitten, MD 07/16/18 1924

## 2018-07-16 NOTE — Discharge Instructions (Signed)
Please increase your metformin dose back to 1000 MG by mouth twice daily.  Call your doctor for follow-up tomorrow and to discuss increasing your metformin dose and possibly stopping your Ramipril (which could be a cause of your worsening cough.)

## 2018-07-16 NOTE — ED Triage Notes (Signed)
Says her blood sugar was 410 this am.  Says she has had a cough since she had surgery in may, but no fever.  Is on inhalers.  No changes in diabetes meds.

## 2018-07-17 ENCOUNTER — Emergency Department (HOSPITAL_COMMUNITY)
Admission: EM | Admit: 2018-07-17 | Discharge: 2018-07-18 | Disposition: A | Discharge status: Home / Self Care | Payer: Commercial Managed Care - PPO | Attending: Emergency Medicine | Admitting: Emergency Medicine

## 2018-07-17 ENCOUNTER — Other Ambulatory Visit: Payer: Self-pay

## 2018-07-17 ENCOUNTER — Encounter (HOSPITAL_COMMUNITY): Payer: Self-pay | Admitting: Emergency Medicine

## 2018-07-17 DIAGNOSIS — R05 Cough: Secondary | ICD-10-CM | POA: Diagnosis not present

## 2018-07-17 DIAGNOSIS — E1165 Type 2 diabetes mellitus with hyperglycemia: Secondary | ICD-10-CM | POA: Insufficient documentation

## 2018-07-17 DIAGNOSIS — R059 Cough, unspecified: Secondary | ICD-10-CM

## 2018-07-17 DIAGNOSIS — Z7984 Long term (current) use of oral hypoglycemic drugs: Secondary | ICD-10-CM | POA: Insufficient documentation

## 2018-07-17 DIAGNOSIS — R739 Hyperglycemia, unspecified: Secondary | ICD-10-CM

## 2018-07-17 DIAGNOSIS — I10 Essential (primary) hypertension: Secondary | ICD-10-CM | POA: Insufficient documentation

## 2018-07-17 LAB — URINALYSIS, ROUTINE W REFLEX MICROSCOPIC
Bacteria, UA: NONE SEEN
Bilirubin Urine: NEGATIVE
Glucose, UA: >=500 mg/dL — AB
Hgb urine dipstick: NEGATIVE
Ketones, ur: NEGATIVE mg/dL
Leukocytes, UA: NEGATIVE
Nitrite: NEGATIVE
PROTEIN: NEGATIVE mg/dL
Specific Gravity, Urine: 1.026 (ref 1.005–1.030)
pH: 6 (ref 5.0–8.0)

## 2018-07-17 LAB — CBC
HEMATOCRIT: 40.6 % (ref 36.0–46.0)
HEMOGLOBIN: 12.5 g/dL (ref 12.0–15.0)
MCH: 23 pg — AB (ref 26.0–34.0)
MCHC: 30.8 g/dL (ref 30.0–36.0)
MCV: 74.6 fL — AB (ref 78.0–100.0)
Platelets: 170 10*3/uL (ref 150–400)
RBC: 5.44 MIL/uL — ABNORMAL HIGH (ref 3.87–5.11)
RDW: 14.5 % (ref 11.5–15.5)
WBC: 6.1 10*3/uL (ref 4.0–10.5)

## 2018-07-17 LAB — CBG MONITORING, ED: Glucose-Capillary: 378 mg/dL — ABNORMAL HIGH (ref 70–99)

## 2018-07-17 LAB — BASIC METABOLIC PANEL
ANION GAP: 9 (ref 5–15)
BUN: 13 mg/dL (ref 6–20)
CHLORIDE: 103 mmol/L (ref 98–111)
CO2: 25 mmol/L (ref 22–32)
Calcium: 9.4 mg/dL (ref 8.9–10.3)
Creatinine, Ser: 0.98 mg/dL (ref 0.44–1.00)
GFR calc non Af Amer: >60 mL/min (ref >60)
Glucose, Bld: 404 mg/dL — ABNORMAL HIGH (ref 70–99)
POTASSIUM: 4.1 mmol/L (ref 3.5–5.1)
Sodium: 137 mmol/L (ref 135–145)

## 2018-07-17 NOTE — ED Triage Notes (Signed)
Pt to ED with c/o hyperglycemia.  Pt st's she had a hyst. In May and has had a cough since then.  Pt was seen at [facility] yesterday for same

## 2018-07-17 NOTE — ED Notes (Signed)
cbg 378 when checked upon arrival. gaa

## 2018-07-18 LAB — URINE CULTURE: SPECIAL REQUESTS: NORMAL

## 2018-07-18 LAB — CBG MONITORING, ED
Glucose-Capillary: 191 mg/dL — ABNORMAL HIGH (ref 70–99)
Glucose-Capillary: 358 mg/dL — ABNORMAL HIGH (ref 70–99)

## 2018-07-18 MED ORDER — INSULIN ASPART 100 UNIT/ML ~~LOC~~ SOLN
SUBCUTANEOUS | Status: AC
  Filled 2018-07-18: qty 1

## 2018-07-18 MED ORDER — SODIUM CHLORIDE 0.9 % IV BOLUS
1000.0000 mL | Freq: Once | INTRAVENOUS | Status: AC
  Administered 2018-07-18: 1000 mL via INTRAVENOUS

## 2018-07-18 MED ORDER — INSULIN ASPART 100 UNIT/ML ~~LOC~~ SOLN
6.0000 [IU] | Freq: Once | SUBCUTANEOUS | Status: AC
  Administered 2018-07-18: 6 [IU] via INTRAVENOUS

## 2018-07-18 NOTE — ED Provider Notes (Signed)
[facility] EMERGENCY DEPARTMENT Provider Note   CSN: [id number] Arrival date & time: 07/17/18  2057     History   Chief Complaint Chief Complaint  Patient presents with  . Hyperglycemia    HPI Grace Gallagher is a 47 y.o. female.  The history is provided by the patient and medical records.    47 year old female with history of anemia, diabetes, hypertension, obesity, presenting to the ED with hyperglycemia.  Patient seen at [facility] yesterday for same.  States lately her blood sugars have been elevated into the 400s range, states she is usually around 120-150.  States she has been having frequent urination and drinking tons of water.  States Dr. she saw yesterday did recommend increasing her metformin from 500 mg twice daily to 1000 mg twice daily.  Patient reports ongoing cough for several months now.  She has not had any fever or chills.  States cough is mild but seems more "bothersome".  She has not been taking anything for cough.  She denies chest pain or shortness of breath.  Past Medical History:  Diagnosis Date  . Anemia   . Diabetes mellitus without complication ([place])   . Hypertension   . Obesity     Patient Active Problem List   Diagnosis Date Noted  . Fibroid 04/05/2018  . Lower respiratory infection 07/07/2015  . Bacterial vaginosis 07/07/2015  . Edema 03/26/2015  . Anal itching 10/14/2014  . Head or neck swelling, mass, or lump 09/25/2014  . Vaginitis and vulvovaginitis 09/10/2014  . Dizziness and giddiness 06/02/2014  . Other malaise and fatigue 05/30/2014  . Acute sinusitis with symptoms > 10 days 04/09/2014  . Essential hypertension, malignant 02/04/2014  . SOB (shortness of breath) 02/04/2014  . Allergic rhinitis 01/29/2014  . Hypokalemia 01/10/2014  . Back pain 01/06/2014  . Bilateral lower abdominal cramping 08/29/2013  . Menorrhagia 08/29/2013  . Encounter for routine gynecological examination 07/01/2013  .  Headache(784.0) 03/31/2013  . HTN (hypertension) 02/21/2013  . Obesity, unspecified 02/21/2013  . Microcytic anemia 02/21/2013    Past Surgical History:  Procedure Laterality Date  . ABDOMINAL HYSTERECTOMY    . BREAST SURGERY  08/2011   breast reduction  . CYSTOSCOPY  04/12/2018   Procedure: CYSTOSCOPY;  Surgeon: Gae Dry, MD;  Location: [facility] ORS;  Service: Gynecology;;  . FOOT SURGERY Left    cyst removed  . LAPAROSCOPIC HYSTERECTOMY Bilateral 04/12/2018   Procedure: HYSTERECTOMY TOTAL LAPAROSCOPIC BILATERAL SALPINGECTOMY;  Surgeon: Gae Dry, MD;  Location: [facility] ORS;  Service: Gynecology;  Laterality: Bilateral;  . TUBAL LIGATION       OB History    Gravida  2   Para  2   Term  0   Preterm  2   AB      Living  2     SAB      TAB      Ectopic      Multiple      Live Births  2            Home Medications    Prior to Admission medications   Medication Sig Start Date End Date Taking? Authorizing Provider  albuterol (PROVENTIL HFA;VENTOLIN HFA) 108 (90 Base) MCG/ACT inhaler Inhale 2 puffs into the lungs every 4 (four) hours as needed for wheezing or shortness of breath.    [provider]  amLODipine (NORVASC) 10 MG tablet Take 1 tablet (10 mg total) by mouth daily. 09/10/14  Lucille Passy, MD  atorvastatin (LIPITOR) 20 MG tablet Take 20 mg by mouth daily. 10/09/17   [provider]  ferrous sulfate 325 (65 FE) MG tablet Take 1 tablet (325 mg total) by mouth daily. Patient taking differently: Take 325 mg by mouth 2 (two) times daily.  03/10/18   Keitha Butte, CNM  furosemide (LASIX) 40 MG tablet Take 40 mg by mouth daily. 05/14/18   [provider]  hydrochlorothiazide (HYDRODIURIL) 25 MG tablet Take 1 tablet (25 mg total) by mouth daily. Patient taking differently: Take 25 mg by mouth at bedtime.  07/07/15   Jaynee Eagles, PA-C  metFORMIN (GLUCOPHAGE) 1000 MG tablet Take 1,000 mg by mouth 2 (two) times daily with a meal.     [provider]  metoprolol succinate (TOPROL-XL) 50 MG 24 hr tablet Take 50 mg by mouth daily.  06/01/16   [provider]  potassium chloride SA (K-DUR,KLOR-CON) 20 MEQ tablet Take 2 tablets (40 mEq total) by mouth once for 1 dose. 06/10/18 06/10/18  Recardo Evangelist, PA-C  ramipril (ALTACE) 10 MG capsule Take 10 mg by mouth at bedtime.  10/10/17   [provider]  traMADol (ULTRAM) 50 MG tablet Take 1 tablet (50 mg total) by mouth every 6 (six) hours as needed. 06/10/18   Recardo Evangelist, PA-C    Family History Family History  Problem Relation Age of Onset  . Hypertension Mother   . Heart failure Mother   . Heart attack Mother   . Cancer Father     Social History Social History   Tobacco Use  . Smoking status: Never Smoker  . Smokeless tobacco: Never Used  Substance Use Topics  . Alcohol use: No  . Drug use: No     Allergies   Hydrocodone   Review of Systems Review of Systems  Endocrine: Positive for polydipsia and polyuria.       Hyperglycemia  All other systems reviewed and are negative.    Physical Exam Updated Vital Signs BP (!) 178/116 (BP Location: Right Arm)   Pulse 75   Temp 98.5 F (36.9 C) (Oral)   Resp 18   Ht 5\' 6"  (1.676 m)   Wt 123.8 kg   LMP 04/03/2018   SpO2 97%   BMI 44.06 kg/m   Physical Exam  Constitutional: She is oriented to person, place, and time. She appears well-developed and well-nourished.  HENT:  Head: Normocephalic and atraumatic.  Mouth/Throat: Oropharynx is clear and moist.  Mildly dry mucous membranes  Eyes: Pupils are equal, round, and reactive to light. Conjunctivae and EOM are normal.  Neck: Normal range of motion.  Cardiovascular: Normal rate, regular rhythm and normal heart sounds.  Pulmonary/Chest: Effort normal and breath sounds normal. No stridor. No respiratory distress. She has no wheezes.  Lungs are clear without any wheezes or rhonchi, no distress, able to speak in full sentences  without difficulty No cough witnessed during exam  Abdominal: Soft. Bowel sounds are normal. There is no tenderness. There is no rebound.  Musculoskeletal: Normal range of motion.  Neurological: She is alert and oriented to person, place, and time.  Skin: Skin is warm and dry.  Psychiatric: She has a normal mood and affect.  Nursing note and vitals reviewed.    ED Treatments / Results  Labs (all labs ordered are listed, but only abnormal results are displayed) Labs Reviewed  BASIC METABOLIC PANEL - Abnormal; Notable for the following components:  Result Value   Glucose, Bld 404 (*)    All other components within normal limits  CBC - Abnormal; Notable for the following components:   RBC 5.44 (*)    MCV 74.6 (*)    MCH 23.0 (*)    All other components within normal limits  URINALYSIS, ROUTINE W REFLEX MICROSCOPIC - Abnormal; Notable for the following components:   Color, Urine STRAW (*)    Glucose, UA >=500 (*)    All other components within normal limits  CBG MONITORING, ED - Abnormal; Notable for the following components:   Glucose-Capillary 378 (*)    All other components within normal limits  CBG MONITORING, ED - Abnormal; Notable for the following components:   Glucose-Capillary 358 (*)    All other components within normal limits  CBG MONITORING, ED - Abnormal; Notable for the following components:   Glucose-Capillary 191 (*)    All other components within normal limits    EKG None  Radiology Dg Chest 2 View  Result Date: 07/16/2018 CLINICAL DATA:  Chronic cough EXAM: CHEST - 2 VIEW COMPARISON:  02/05/2018 FINDINGS: Linear atelectasis in the left mid lung and bases. No acute consolidation or effusion. Normal heart size. No pneumothorax. IMPRESSION: No active cardiopulmonary disease. Subsegmental atelectasis in the left mid lung and bilateral lung bases. Electronically Signed   By: Donavan Foil M.D.   On: 07/16/2018 16:57    Procedures Procedures (including  critical care time)  Medications Ordered in ED Medications  sodium chloride 0.9 % bolus 1,000 mL (1,000 mLs Intravenous New Bag/Given 07/18/18 0100)  insulin aspart (novoLOG) injection 6 Units (6 Units Intravenous Given 07/18/18 0106)     Initial Impression / Assessment and Plan / ED Course  I have reviewed the triage vital signs and the nursing notes.  Pertinent labs & imaging results that were available during my care of the patient were reviewed by me and considered in my medical decision making (see chart for details).  47 year old female here with recurrent hyperglycemia.  Seen at [facility] yesterday for same, metformin was increased at that time.  States she continues to have frequent urination and increased thirst.  States overall she just feels poorly.  She is afebrile and nontoxic.  Blood pressure is mildly elevated.  Her glucose in the 400s, but no evidence of DKA with normal bicarb and anion gap.  No ketones in the urine.  Suspect that her body has not had time to adjust to the increase metformin yet.  Patient was given IV fluids and small dose of insulin here with improvement of her glucose to 191.  Patient also reports ongoing cough for several months.  She is on an ACEI which I suspect is the underlying etiology.  She had a chest x-ray yesterday that was normal, not feel this needs to be repeated.  She has no chest pain or shortness of breath to suggest ACS, PE, dissection, acute cardiac event.  I recommend the patient follow-up closely with her primary care doctor about her medications any future adjustments.  She can return here for any new or worsening symptoms.  Final Clinical Impressions(s) / ED Diagnoses   Final diagnoses:  Hyperglycemia  Cough    ED Discharge Orders    None       Larene Pickett, PA-C 07/18/18 0340    Fatima Blank, MD 07/18/18 [phone]

## 2018-07-18 NOTE — Discharge Instructions (Signed)
Would continue the higher dose of metformin as directed yesterday. Talk to your doctor about your ramipril as this can cause chronic cough. Recommend close follow-up with your primary care doctor to address your medications and re-check. Return here for any new/acute changes.

## 2018-07-25 ENCOUNTER — Emergency Department: Payer: Commercial Managed Care - PPO

## 2018-07-25 ENCOUNTER — Observation Stay
Admission: EM | Admit: 2018-07-25 | Discharge: 2018-07-26 | Disposition: A | Discharge status: Home / Self Care | Payer: Commercial Managed Care - PPO | Attending: Internal Medicine | Admitting: Internal Medicine

## 2018-07-25 ENCOUNTER — Other Ambulatory Visit: Payer: Self-pay

## 2018-07-25 DIAGNOSIS — Z6841 Body Mass Index (BMI) 40.0 and over, adult: Secondary | ICD-10-CM | POA: Diagnosis not present

## 2018-07-25 DIAGNOSIS — R110 Nausea: Secondary | ICD-10-CM | POA: Diagnosis present

## 2018-07-25 DIAGNOSIS — Z885 Allergy status to narcotic agent status: Secondary | ICD-10-CM | POA: Diagnosis not present

## 2018-07-25 DIAGNOSIS — E1165 Type 2 diabetes mellitus with hyperglycemia: Secondary | ICD-10-CM | POA: Diagnosis not present

## 2018-07-25 DIAGNOSIS — I10 Essential (primary) hypertension: Secondary | ICD-10-CM | POA: Diagnosis not present

## 2018-07-25 DIAGNOSIS — Z7984 Long term (current) use of oral hypoglycemic drugs: Secondary | ICD-10-CM | POA: Insufficient documentation

## 2018-07-25 DIAGNOSIS — N179 Acute kidney failure, unspecified: Secondary | ICD-10-CM | POA: Diagnosis not present

## 2018-07-25 DIAGNOSIS — R197 Diarrhea, unspecified: Secondary | ICD-10-CM | POA: Diagnosis not present

## 2018-07-25 DIAGNOSIS — E669 Obesity, unspecified: Secondary | ICD-10-CM | POA: Diagnosis not present

## 2018-07-25 DIAGNOSIS — K8020 Calculus of gallbladder without cholecystitis without obstruction: Secondary | ICD-10-CM | POA: Diagnosis not present

## 2018-07-25 DIAGNOSIS — E860 Dehydration: Secondary | ICD-10-CM | POA: Diagnosis not present

## 2018-07-25 DIAGNOSIS — R17 Unspecified jaundice: Secondary | ICD-10-CM

## 2018-07-25 LAB — BASIC METABOLIC PANEL
Anion gap: 10 (ref 5–15)
BUN: 37 mg/dL — AB (ref 6–20)
CHLORIDE: 102 mmol/L (ref 98–111)
CO2: 23 mmol/L (ref 22–32)
Calcium: 9.3 mg/dL (ref 8.9–10.3)
Creatinine, Ser: 2.48 mg/dL — ABNORMAL HIGH (ref 0.44–1.00)
GFR calc Af Amer: 26 mL/min — ABNORMAL LOW (ref >60)
GFR calc non Af Amer: 22 mL/min — ABNORMAL LOW (ref >60)
Glucose, Bld: 253 mg/dL — ABNORMAL HIGH (ref 70–99)
POTASSIUM: 3.7 mmol/L (ref 3.5–5.1)
SODIUM: 135 mmol/L (ref 135–145)

## 2018-07-25 LAB — GLUCOSE, CAPILLARY
GLUCOSE-CAPILLARY: 237 mg/dL — AB (ref 70–99)
Glucose-Capillary: 130 mg/dL — ABNORMAL HIGH (ref 70–99)

## 2018-07-25 LAB — CBC
HEMATOCRIT: 39.1 % (ref 35.0–47.0)
Hemoglobin: 12.5 g/dL (ref 12.0–16.0)
MCH: 23.4 pg — ABNORMAL LOW (ref 26.0–34.0)
MCHC: 32 g/dL (ref 32.0–36.0)
MCV: 73.1 fL — AB (ref 80.0–100.0)
Platelets: 182 10*3/uL (ref 150–440)
RBC: 5.35 MIL/uL — ABNORMAL HIGH (ref 3.80–5.20)
RDW: 14.5 % (ref 11.5–14.5)
WBC: 5.7 10*3/uL (ref 3.6–11.0)

## 2018-07-25 LAB — URINALYSIS, COMPLETE (UACMP) WITH MICROSCOPIC
BILIRUBIN URINE: NEGATIVE
Glucose, UA: NEGATIVE mg/dL
HGB URINE DIPSTICK: NEGATIVE
KETONES UR: NEGATIVE mg/dL
LEUKOCYTES UA: NEGATIVE
Nitrite: NEGATIVE
PROTEIN: NEGATIVE mg/dL
Specific Gravity, Urine: 1.013 (ref 1.005–1.030)
pH: 5 (ref 5.0–8.0)

## 2018-07-25 LAB — HEPATIC FUNCTION PANEL
ALK PHOS: 59 U/L (ref 38–126)
ALT: 32 U/L (ref 0–44)
AST: 32 U/L (ref 15–41)
Albumin: 3.9 g/dL (ref 3.5–5.0)
BILIRUBIN INDIRECT: 1.4 mg/dL — AB (ref 0.3–0.9)
BILIRUBIN TOTAL: 1.6 mg/dL — AB (ref 0.3–1.2)
Bilirubin, Direct: 0.2 mg/dL (ref 0.0–0.2)
Total Protein: 7.2 g/dL (ref 6.5–8.1)

## 2018-07-25 LAB — CK: Total CK: 141 U/L (ref 38–234)

## 2018-07-25 MED ORDER — SODIUM CHLORIDE 0.9 % IV BOLUS
1000.0000 mL | Freq: Once | INTRAVENOUS | Status: AC
  Administered 2018-07-25: 1000 mL via INTRAVENOUS

## 2018-07-25 MED ORDER — AMLODIPINE BESYLATE 10 MG PO TABS
10.0000 mg | ORAL_TABLET | Freq: Every day | ORAL | Status: DC
  Administered 2018-07-26: 10 mg via ORAL
  Filled 2018-07-25: qty 1

## 2018-07-25 MED ORDER — ONDANSETRON HCL 4 MG PO TABS: 4.0000 mg | ORAL_TABLET | Freq: Four times a day (QID) | ORAL | Status: DC | PRN

## 2018-07-25 MED ORDER — INSULIN ASPART 100 UNIT/ML ~~LOC~~ SOLN
0.0000 [IU] | Freq: Three times a day (TID) | SUBCUTANEOUS | Status: DC
  Administered 2018-07-26: 3 [IU] via SUBCUTANEOUS
  Administered 2018-07-26: 8 [IU] via SUBCUTANEOUS
  Filled 2018-07-25: qty 1
  Filled 2018-07-25: qty 0.15
  Filled 2018-07-25: qty 1

## 2018-07-25 MED ORDER — SODIUM CHLORIDE 0.9 % IV SOLN
INTRAVENOUS | Status: DC
  Administered 2018-07-25 – 2018-07-26 (×2): via INTRAVENOUS

## 2018-07-25 MED ORDER — ATORVASTATIN CALCIUM 20 MG PO TABS
40.0000 mg | ORAL_TABLET | Freq: Every day | ORAL | Status: DC
  Administered 2018-07-26: 40 mg via ORAL
  Filled 2018-07-25: qty 2

## 2018-07-25 MED ORDER — ONDANSETRON HCL 4 MG/2ML IJ SOLN
4.0000 mg | Freq: Four times a day (QID) | INTRAMUSCULAR | Status: DC | PRN
  Filled 2018-07-25 (×2): qty 2

## 2018-07-25 MED ORDER — SENNOSIDES-DOCUSATE SODIUM 8.6-50 MG PO TABS: 1.0000 | ORAL_TABLET | Freq: Every evening | ORAL | Status: DC | PRN

## 2018-07-25 MED ORDER — HEPARIN SODIUM (PORCINE) 5000 UNIT/ML IJ SOLN
5000.0000 [IU] | Freq: Three times a day (TID) | INTRAMUSCULAR | Status: DC
  Filled 2018-07-25: qty 1

## 2018-07-25 MED ORDER — ACETAMINOPHEN 650 MG RE SUPP: 650.0000 mg | Freq: Four times a day (QID) | RECTAL | Status: DC | PRN

## 2018-07-25 MED ORDER — ACETAMINOPHEN 325 MG PO TABS: 650.0000 mg | ORAL_TABLET | Freq: Four times a day (QID) | ORAL | Status: DC | PRN

## 2018-07-25 MED ORDER — METOPROLOL SUCCINATE ER 50 MG PO TB24
50.0000 mg | ORAL_TABLET | Freq: Every day | ORAL | Status: DC
  Administered 2018-07-26: 50 mg via ORAL
  Filled 2018-07-25: qty 1

## 2018-07-25 MED ORDER — INSULIN ASPART 100 UNIT/ML ~~LOC~~ SOLN
0.0000 [IU] | Freq: Every day | SUBCUTANEOUS | Status: DC
  Filled 2018-07-25: qty 0.05

## 2018-07-25 NOTE — ED Notes (Signed)
RN, aware of bed assigned

## 2018-07-25 NOTE — ED Provider Notes (Signed)
[facility] Emergency Department Provider Note    First MD Initiated Contact with Patient 07/25/18 1600     (approximate)  I have reviewed the triage vital signs and the nursing notes.   HISTORY  Chief Complaint Hyperglycemia    HPI Grace Gallagher is a 47 y.o. female history of diabetes and hypertension presents the ER with chief complaint of nausea decreased appetite and malaise for the past week.  Patient seen in the ER for hyperglycemia and cough.  Was found to be hyperglycemic and given IV fluids and a small dose of insulin.  She followed up with her PCP and was started on Trulicity.  States that since being started on that medication she has had no appetite felt very nauseated fatigued and had generalized malaise.  States that today while at work she felt she was about to pass out.  She denies any pain.  No shortness of breath or chest discomfort.  No vomiting.  No dysuria.  No leg swelling.    Past Medical History:  Diagnosis Date  . Anemia   . Diabetes mellitus without complication ([place])   . Hypertension   . Obesity    Family History  Problem Relation Age of Onset  . Hypertension Mother   . Heart failure Mother   . Heart attack Mother   . Cancer Father    Past Surgical History:  Procedure Laterality Date  . ABDOMINAL HYSTERECTOMY    . BREAST SURGERY  08/2011   breast reduction  . CYSTOSCOPY  04/12/2018   Procedure: CYSTOSCOPY;  Surgeon: Gae Dry, MD;  Location: [facility] ORS;  Service: Gynecology;;  . FOOT SURGERY Left    cyst removed  . LAPAROSCOPIC HYSTERECTOMY Bilateral 04/12/2018   Procedure: HYSTERECTOMY TOTAL LAPAROSCOPIC BILATERAL SALPINGECTOMY;  Surgeon: Gae Dry, MD;  Location: [facility] ORS;  Service: Gynecology;  Laterality: Bilateral;  . TUBAL LIGATION     Patient Active Problem List   Diagnosis Date Noted  . Fibroid 04/05/2018  . Lower respiratory infection 07/07/2015  . Bacterial vaginosis 07/07/2015  . Edema  03/26/2015  . Anal itching 10/14/2014  . Head or neck swelling, mass, or lump 09/25/2014  . Vaginitis and vulvovaginitis 09/10/2014  . Dizziness and giddiness 06/02/2014  . Other malaise and fatigue 05/30/2014  . Acute sinusitis with symptoms > 10 days 04/09/2014  . Essential hypertension, malignant 02/04/2014  . SOB (shortness of breath) 02/04/2014  . Allergic rhinitis 01/29/2014  . Hypokalemia 01/10/2014  . Back pain 01/06/2014  . Bilateral lower abdominal cramping 08/29/2013  . Menorrhagia 08/29/2013  . Encounter for routine gynecological examination 07/01/2013  . Headache(784.0) 03/31/2013  . HTN (hypertension) 02/21/2013  . Obesity, unspecified 02/21/2013  . Microcytic anemia 02/21/2013      Prior to Admission medications   Medication Sig Start Date End Date Taking? Authorizing Provider  albuterol (PROVENTIL HFA;VENTOLIN HFA) 108 (90 Base) MCG/ACT inhaler Inhale 2 puffs into the lungs every 4 (four) hours as needed for wheezing or shortness of breath.    [provider]  amLODipine (NORVASC) 10 MG tablet Take 1 tablet (10 mg total) by mouth daily. 09/10/14   Lucille Passy, MD  atorvastatin (LIPITOR) 20 MG tablet Take 20 mg by mouth daily. 10/09/17   [provider]  ferrous sulfate 325 (65 FE) MG tablet Take 1 tablet (325 mg total) by mouth daily. Patient taking differently: Take 325 mg by mouth 2 (two) times daily.  03/10/18   Keitha Butte, CNM  furosemide (LASIX) 40 MG tablet Take 40 mg by mouth daily. 05/14/18   [provider]  hydrochlorothiazide (HYDRODIURIL) 25 MG tablet Take 1 tablet (25 mg total) by mouth daily. Patient taking differently: Take 25 mg by mouth at bedtime.  07/07/15   Jaynee Eagles, PA-C  metFORMIN (GLUCOPHAGE) 1000 MG tablet Take 1,000 mg by mouth 2 (two) times daily with a meal.    [provider]  metoprolol succinate (TOPROL-XL) 50 MG 24 hr tablet Take 50 mg by mouth daily.  06/01/16   [provider]  potassium  chloride SA (K-DUR,KLOR-CON) 20 MEQ tablet Take 2 tablets (40 mEq total) by mouth once for 1 dose. Patient taking differently: Take 40 mEq by mouth daily.  06/10/18 07/18/26  Recardo Evangelist, PA-C  traMADol (ULTRAM) 50 MG tablet Take 1 tablet (50 mg total) by mouth every 6 (six) hours as needed. 06/10/18   Recardo Evangelist, PA-C    Allergies Hydrocodone    Social History Social History   Tobacco Use  . Smoking status: Never Smoker  . Smokeless tobacco: Never Used  Substance Use Topics  . Alcohol use: No  . Drug use: No    Review of Systems Patient denies headaches, rhinorrhea, blurry vision, numbness, shortness of breath, chest pain, edema, cough, abdominal pain, nausea, vomiting, diarrhea, dysuria, fevers, rashes or hallucinations unless otherwise stated above in HPI. ____________________________________________   PHYSICAL EXAM:  VITAL SIGNS: Vitals:   07/25/18 1439  BP: 132/80  Pulse: 83  Resp: 16  Temp: 98.6 F (37 C)  SpO2: 98%    Constitutional: Alert and oriented.  Eyes: Conjunctivae are normal.  Head: Atraumatic. Nose: No congestion/rhinnorhea. Mouth/Throat: Mucous membranes are moist.   Neck: No stridor. Painless ROM.  Cardiovascular: Normal rate, regular rhythm. Grossly normal heart sounds.  Good peripheral circulation. Respiratory: Normal respiratory effort.  No retractions. Lungs CTAB. Gastrointestinal: Soft and nontender. No distention. No abdominal bruits. No CVA tenderness. Genitourinary:  Musculoskeletal: No lower extremity tenderness nor edema.  No joint effusions. Neurologic:  Normal speech and language. No gross focal neurologic deficits are appreciated. No facial droop Skin:  Skin is warm, dry and intact. No rash noted. Psychiatric: Mood and affect are normal. Speech and behavior are normal.  ____________________________________________   LABS (all labs ordered are listed, but only abnormal results are displayed)  Results for orders placed  or performed during the hospital encounter of 07/25/18 (from the past 24 hour(s))  Basic metabolic panel     Status: Abnormal   Collection Time: 07/25/18  2:40 PM  Result Value Ref Range   Sodium 135 135 - 145 mmol/L   Potassium 3.7 3.5 - 5.1 mmol/L   Chloride 102 98 - 111 mmol/L   CO2 23 22 - 32 mmol/L   Glucose, Bld 253 (H) 70 - 99 mg/dL   BUN 37 (H) 6 - 20 mg/dL   Creatinine, Ser 2.48 (H) 0.44 - 1.00 mg/dL   Calcium 9.3 8.9 - 10.3 mg/dL   GFR calc non Af Amer 22 (L) >60 mL/min   GFR calc Af Amer 26 (L) >60 mL/min   Anion gap 10 5 - 15  CBC     Status: Abnormal   Collection Time: 07/25/18  2:40 PM  Result Value Ref Range   WBC 5.7 3.6 - 11.0 K/uL   RBC 5.35 (H) 3.80 - 5.20 MIL/uL   Hemoglobin 12.5 12.0 - 16.0 g/dL   HCT 39.1 35.0 - 47.0 %   MCV 73.1 (L)  80.0 - 100.0 fL   MCH 23.4 (L) 26.0 - 34.0 pg   MCHC 32.0 32.0 - 36.0 g/dL   RDW 14.5 11.5 - 14.5 %   Platelets 182 150 - 440 K/uL  Glucose, capillary     Status: Abnormal   Collection Time: 07/25/18  2:44 PM  Result Value Ref Range   Glucose-Capillary 237 (H) 70 - 99 mg/dL  Urinalysis, Complete w Microscopic     Status: Abnormal   Collection Time: 07/25/18  4:03 PM  Result Value Ref Range   Color, Urine YELLOW (A) YELLOW   APPearance CLOUDY (A) CLEAR   Specific Gravity, Urine 1.013 1.005 - 1.030   pH 5.0 5.0 - 8.0   Glucose, UA NEGATIVE NEGATIVE mg/dL   Hgb urine dipstick NEGATIVE NEGATIVE   Bilirubin Urine NEGATIVE NEGATIVE   Ketones, ur NEGATIVE NEGATIVE mg/dL   Protein, ur NEGATIVE NEGATIVE mg/dL   Nitrite NEGATIVE NEGATIVE   Leukocytes, UA NEGATIVE NEGATIVE   RBC / HPF 0-5 0 - 5 RBC/hpf   WBC, UA 0-5 0 - 5 WBC/hpf   Bacteria, UA RARE (A) NONE SEEN   Squamous Epithelial / LPF 0-5 0 - 5   Mucus PRESENT    Uric Acid Crys, UA PRESENT    ____________________________________________  ____________________________________________  RADIOLOGY  I personally reviewed all radiographic images ordered to evaluate for  the above acute complaints and reviewed radiology reports and findings.  These findings were personally discussed with the patient.  Please see medical record for radiology report.  ____________________________________________   PROCEDURES  Procedure(s) performed:  Procedures    Critical Care performed: no ____________________________________________   INITIAL IMPRESSION / ASSESSMENT AND PLAN / ED COURSE  Pertinent labs & imaging results that were available during my care of the patient were reviewed by me and considered in my medical decision making (see chart for details).   DDX: aki, dehydration, medication effect, uti, nephrotic/nephritic syndrome  Nina Mondor Mccaughan is a 47 y.o. who presents to the ED with symptoms as described above.  Patient afebrile and hemodynamically stable.  Blood work sent for the above differential does show evidence of AKI without evidence of acidosis or hyperkalemia.  Her abdominal exam is soft and benign.  No evidence of anemia or infectious process.  We will give IV fluids as I do suspect some component of dehydration.  Possible medication reaction to Trulicity.  Urinalysis to evaluate for proteinuria.  Clinical Course as of Jul 25 1849  Wed Jul 25, 2018  1832 Uric Acid Crys, UA: PRESENT [PR]  1832 No evidence of biliary obstruction at this time.  T bili elevation nonspecific.  No elevation of LFTs.  Certainly no evidence of cholecystitis.  Renal ultrasound is reassuring.  Do suspect some component of medication induced nephropathy.  Also noted uric acid crystals and urinalysis.  Based on presentation decreased oral intake do feel patient will require admission the hospital for further IV hydration and trending of blood work.   [PR]    Clinical Course User Index [PR] Merlyn Lot, MD     As part of my medical decision making, I reviewed the following data within the [place] notes reviewed and incorporated, Labs  reviewed, notes from prior ED visits and [place] Controlled Substance Database   ____________________________________________   FINAL CLINICAL IMPRESSION(S) / ED DIAGNOSES  Final diagnoses:  Elevated bilirubin  AKI (acute kidney injury) ([place])  Nausea      NEW MEDICATIONS STARTED DURING THIS VISIT:  New Prescriptions   No medications on file     Note:  This document was prepared using Dragon voice recognition software and may include unintentional dictation errors.    Merlyn Lot, MD 07/25/18 1850

## 2018-07-25 NOTE — ED Notes (Signed)
Pt reports she was here last week and blood sugar was high "411" - she states that this week she feels bad and blood sugar was "263" - c/o nausea - pt went to see PCP last week and got rx for [place] and she feels like her decreased appetite is due to the new medication

## 2018-07-25 NOTE — H&P (Signed)
[facility] [place] NAME: Grace Gallagher    MR#:  [id number]  DATE OF BIRTH:  December 25, 1970  DATE OF ADMISSION:  07/25/2018  PRIMARY CARE PHYSICIAN: [facility]   REQUESTING/REFERRING PHYSICIAN:   CHIEF COMPLAINT:   Chief Complaint  Patient presents with  . Hyperglycemia    HISTORY OF PRESENT ILLNESS: Grace Gallagher  is a 47 y.o. female with a known history of diabetes mellitus, hypertension, obesity initially presented to the emergency room for decreased appetite and malaise.  She also was noted to have high blood sugars for the last couple of days.  In the emergency room she was evaluated and found to be in renal failure.  Creatinine is elevated.  Patient currently on diabetic medication and diuretics as outpatient.  She has not been drinking enough fluids.  She was worked up with renal ultrasound which showed no stones in the kidneys, ultrasound of the abdomen showed cholelithiasis.  PAST MEDICAL HISTORY:   Past Medical History:  Diagnosis Date  . Anemia   . Diabetes mellitus without complication ([place])   . Hypertension   . Obesity     PAST SURGICAL HISTORY:  Past Surgical History:  Procedure Laterality Date  . ABDOMINAL HYSTERECTOMY    . BREAST SURGERY  08/2011   breast reduction  . CYSTOSCOPY  04/12/2018   Procedure: CYSTOSCOPY;  Surgeon: Gae Dry, MD;  Location: [facility] ORS;  Service: Gynecology;;  . FOOT SURGERY Left    cyst removed  . LAPAROSCOPIC HYSTERECTOMY Bilateral 04/12/2018   Procedure: HYSTERECTOMY TOTAL LAPAROSCOPIC BILATERAL SALPINGECTOMY;  Surgeon: Gae Dry, MD;  Location: [facility] ORS;  Service: Gynecology;  Laterality: Bilateral;  . TUBAL LIGATION      SOCIAL HISTORY:  Social History   Tobacco Use  . Smoking status: Never Smoker  . Smokeless tobacco: Never Used  Substance Use Topics  . Alcohol use: No    FAMILY HISTORY:  Family History  Problem Relation Age of  Onset  . Hypertension Mother   . Heart failure Mother   . Heart attack Mother   . Cancer Father     DRUG ALLERGIES:  Allergies  Allergen Reactions  . Hydrocodone Nausea Only    REVIEW OF SYSTEMS:   CONSTITUTIONAL: No fever,has fatigue and weakness.  EYES: No blurred or double vision.  EARS, NOSE, AND THROAT: No tinnitus or ear pain.  RESPIRATORY: No cough, shortness of breath, wheezing or hemoptysis.  CARDIOVASCULAR: No chest pain, orthopnea, edema.  GASTROINTESTINAL: Has nausea, vomiting, no diarrhea  mild abdominal pain.  GENITOURINARY: No dysuria, hematuria.  ENDOCRINE: No polyuria, nocturia,  HEMATOLOGY: No anemia, easy bruising or bleeding SKIN: No rash or lesion. MUSCULOSKELETAL: No joint pain or arthritis.   NEUROLOGIC: No tingling, numbness, weakness.  PSYCHIATRY: No anxiety or depression.   MEDICATIONS AT HOME:  Prior to Admission medications   Medication Sig Start Date End Date Taking? Authorizing Provider  albuterol (PROVENTIL HFA;VENTOLIN HFA) 108 (90 Base) MCG/ACT inhaler Inhale 2 puffs into the lungs every 4 (four) hours as needed for wheezing or shortness of breath.   Yes [provider]  amLODipine (NORVASC) 10 MG tablet Take 1 tablet (10 mg total) by mouth daily. 09/10/14  Yes Lucille Passy, MD  atorvastatin (LIPITOR) 40 MG tablet Take 40 mg by mouth daily.    Yes [provider]  Dulaglutide (TRULICITY) 1.94 RD/4.0CX SOPN Inject 0.75 mg into the skin once a week.  Yes [provider]  ferrous sulfate 325 (65 FE) MG tablet Take 1 tablet (325 mg total) by mouth daily. Patient taking differently: Take 325 mg by mouth 2 (two) times daily.  03/10/18  Yes Keitha Butte, CNM  furosemide (LASIX) 40 MG tablet Take 40 mg by mouth daily as needed for fluid or edema.  05/14/18  Yes [provider]  losartan-hydrochlorothiazide (HYZAAR) 100-25 MG tablet Take 1 tablet by mouth daily. 07/18/18  Yes [provider]  metFORMIN  (GLUCOPHAGE) 1000 MG tablet Take 1,000 mg by mouth 2 (two) times daily with a meal.   Yes [provider]  metoprolol succinate (TOPROL-XL) 50 MG 24 hr tablet Take 50 mg by mouth daily.  06/01/16  Yes [provider]  potassium chloride SA (K-DUR,KLOR-CON) 20 MEQ tablet Take 2 tablets (40 mEq total) by mouth once for 1 dose. Patient taking differently: Take 40 mEq by mouth daily as needed (when taking Lasix).  06/10/18 07/18/26 Yes Recardo Evangelist, PA-C  ZOFRAN 4 MG tablet Take 4 mg by mouth every 8 (eight) hours as needed for nausea/vomiting. 07/23/18  Yes [provider]  hydrochlorothiazide (HYDRODIURIL) 25 MG tablet Take 1 tablet (25 mg total) by mouth daily. Patient not taking: Reported on 07/25/2018 07/07/15   Jaynee Eagles, PA-C  traMADol (ULTRAM) 50 MG tablet Take 1 tablet (50 mg total) by mouth every 6 (six) hours as needed. Patient not taking: Reported on 07/25/2018 06/10/18   Recardo Evangelist, PA-C      PHYSICAL EXAMINATION:   VITAL SIGNS: Blood pressure 132/80, pulse 83, temperature 98.6 F (37 C), temperature source Oral, resp. rate 16, height 5\' 6"  (1.676 m), weight 123 kg, last menstrual period 04/03/2018, SpO2 98 %.  GENERAL:  47 y.o.-year-old patient lying in the bed with no acute distress.  EYES: Pupils equal, round, reactive to light and accommodation. No scleral icterus. Extraocular muscles intact.  HEENT: Head atraumatic, normocephalic. Oropharynx and nasopharynx clear.  NECK:  Supple, no jugular venous distention. No thyroid enlargement, no tenderness.  LUNGS: Normal breath sounds bilaterally, no wheezing, rales,rhonchi or crepitation. No use of accessory muscles of respiration.  CARDIOVASCULAR: S1, S2 normal. No murmurs, rubs, or gallops.  ABDOMEN: Soft, nontender, nondistended. Bowel sounds present. No organomegaly or mass.  EXTREMITIES: No pedal edema, cyanosis, or clubbing.  NEUROLOGIC: Cranial nerves II through XII are intact. Muscle strength 5/5  in all extremities. Sensation intact. Gait not checked.  PSYCHIATRIC: The patient is alert and oriented x 3.  SKIN: No obvious rash, lesion, or ulcer.   LABORATORY PANEL:   CBC Recent Labs  Lab 07/25/18 1440  WBC 5.7  HGB 12.5  HCT 39.1  PLT 182  MCV 73.1*  MCH 23.4*  MCHC 32.0  RDW 14.5   ------------------------------------------------------------------------------------------------------------------  Chemistries  Recent Labs  Lab 07/25/18 1440  NA 135  K 3.7  CL 102  CO2 23  GLUCOSE 253*  BUN 37*  CREATININE 2.48*  CALCIUM 9.3  AST 32  ALT 32  ALKPHOS 59  BILITOT 1.6*   ------------------------------------------------------------------------------------------------------------------ estimated creatinine clearance is 37.9 mL/min (A) (by C-G formula based on SCr of 2.48 mg/dL (H)). ------------------------------------------------------------------------------------------------------------------ No results for input(s): TSH, T4TOTAL, T3FREE, THYROIDAB in the last 72 hours.  Invalid input(s): FREET3   Coagulation profile No results for input(s): INR, PROTIME in the last 168 hours. ------------------------------------------------------------------------------------------------------------------- No results for input(s): DDIMER in the last 72 hours. -------------------------------------------------------------------------------------------------------------------  Cardiac Enzymes No results for input(s): CKMB, TROPONINI, MYOGLOBIN in the  last 168 hours.  Invalid input(s): CK ------------------------------------------------------------------------------------------------------------------ Invalid input(s): POCBNP  ---------------------------------------------------------------------------------------------------------------  Urinalysis    Component Value Date/Time   COLORURINE YELLOW (A) 07/25/2018 1603   APPEARANCEUR CLOUDY (A) 07/25/2018 1603   LABSPEC  1.013 07/25/2018 1603   PHURINE 5.0 07/25/2018 1603   GLUCOSEU NEGATIVE 07/25/2018 1603   GLUCOSEU NEGATIVE 11/06/2013 1702   HGBUR NEGATIVE 07/25/2018 1603   BILIRUBINUR NEGATIVE 07/25/2018 1603   BILIRUBINUR negative 06/24/2015 1812   KETONESUR NEGATIVE 07/25/2018 1603   PROTEINUR NEGATIVE 07/25/2018 1603   UROBILINOGEN 0.2 06/24/2015 1812   UROBILINOGEN 1.0 06/07/2015 0019   NITRITE NEGATIVE 07/25/2018 1603   LEUKOCYTESUR NEGATIVE 07/25/2018 1603     RADIOLOGY: US Renal  Result Date: 07/25/2018 CLINICAL DATA:  Acute renal injury and dehydration EXAM: RENAL / URINARY TRACT ULTRASOUND COMPLETE COMPARISON:  11/30/2017 CT FINDINGS: Right Kidney: Length: 11 cm. Echogenicity within normal limits. No mass or hydronephrosis visualized. Left Kidney: Length: 11.1 cm. Echogenicity within normal limits. No mass or hydronephrosis visualized. Bladder: Appears normal for degree of bladder distention. Other: Echogenic liver parenchyma compatible with steatosis. IMPRESSION: Echogenic appearance of the adjacent liver compatible with steatosis. Unremarkable renal ultrasound otherwise. Electronically Signed   By: Ashley Royalty M.D.   On: 07/25/2018 18:18   US Abdomen Limited Ruq  Result Date: 07/25/2018 CLINICAL DATA:  Unspecified disorder of liver function. Elevated liver function tests and bilirubin. EXAM: ULTRASOUND ABDOMEN LIMITED RIGHT UPPER QUADRANT COMPARISON:  CT 11/30/2017 FINDINGS: Gallbladder: Biliary sludge and layering calculi are identified without pericholecystic fluid or wall thickening. The single wall thickness of the gallbladder is normal at 2 mm. Largest calcification identified within the gallbladder is approximately 6.4 mm. Common bile duct: Diameter: Normal at 5.3 mm Liver: Coarsened echotexture of the liver without space-occupying mass. No biliary dilatation. Portal vein is patent on color Doppler imaging with normal direction of blood flow towards the liver. IMPRESSION: Uncomplicated  cholelithiasis. Electronically Signed   By: Ashley Royalty M.D.   On: 07/25/2018 18:14    EKG: Orders placed or performed during the hospital encounter of 07/16/18  . ED EKG  . ED EKG  . EKG 12-Lead  . EKG 12-Lead  . EKG    IMPRESSION AND PLAN:  47 year old female patient with history of diabetes mellitus, hypertension presented to the emergency room for nausea, general malaise  -Acute kidney injury Hold diuretics and metformin IV fluid hydration Follow-up renal function Renal ultrasound showed no stones  -Cholelithiasis Surgery consult Follow-up liver function tests  -Diabetes mellitus type 2 Oral diabetic meds on hold for renal failure Sliding scale coverage with insulin  -DVT prophylaxis subcu heparin  All the records are reviewed and case discussed with ED provider. Management plans discussed with the patient, family and they are in agreement.  CODE STATUS:Full code    TOTAL TIME TAKING CARE OF THIS PATIENT: 54 minutes.    Saundra Shelling M.D on 07/25/2018 at 7:40 PM  Between 7am to 6pm - Pager - [phone]  After 6pm go to [email or web address] - password [email or web address] [facility]  [facility] [place] [facility]  Office  [phone]  CC: Primary care physician; [facility] [place]

## 2018-07-25 NOTE — Consult Note (Signed)
Surgical Consultation  07/25/2018  Grace Gallagher is an 47 y.o. female.   Referring Physician: Dr. Posey Pronto  CC: Nausea  HPI: This a 47 year old female patient with a 1 week or more history of nausea.  She is had some nausea that is been fairly severe causing her did not want to eat.  She did eat a sandwich today in the ER and has not vomited she has not vomited much at home but only occasionally.  Her bowel movements are irregular and often loose and even calls it diarrhea at times.  She denies fevers or chills.  Patient denies any abdominal pain this week.  She has never had right upper quadrant pain.  Patient was found to have gallstones on ultrasound with a elevated bilirubin but normal AST ALT and I was asked to see the patient for possible gallbladder disease.  He has diabetes and other medical problems  She works at [employer] does not smoke.  Family history significant for diabetes.  Past Medical History:  Diagnosis Date  . Anemia   . Diabetes mellitus without complication ([place])   . Hypertension   . Obesity     Past Surgical History:  Procedure Laterality Date  . ABDOMINAL HYSTERECTOMY    . BREAST SURGERY  08/2011   breast reduction  . CYSTOSCOPY  04/12/2018   Procedure: CYSTOSCOPY;  Surgeon: Gae Dry, MD;  Location: [facility] ORS;  Service: Gynecology;;  . FOOT SURGERY Left    cyst removed  . LAPAROSCOPIC HYSTERECTOMY Bilateral 04/12/2018   Procedure: HYSTERECTOMY TOTAL LAPAROSCOPIC BILATERAL SALPINGECTOMY;  Surgeon: Gae Dry, MD;  Location: [facility] ORS;  Service: Gynecology;  Laterality: Bilateral;  . TUBAL LIGATION      Family History  Problem Relation Age of Onset  . Hypertension Mother   . Heart failure Mother   . Heart attack Mother   . Cancer Father     Social History:  reports that she has never smoked. She has never used smokeless tobacco. She reports that she does not drink alcohol or use drugs.  Allergies:  Allergies  Allergen  Reactions  . Hydrocodone Nausea Only    Medications reviewed.   Review of Systems:   Review of Systems  Constitutional: Negative for chills, fever and weight loss.  HENT: Negative.   Eyes: Negative.   Respiratory: Negative.   Cardiovascular: Negative.   Gastrointestinal: Positive for constipation, diarrhea, nausea and vomiting. Negative for abdominal pain, blood in stool, heartburn and melena.  Genitourinary: Negative.   Musculoskeletal: Negative.   Skin: Negative.   Neurological: Negative.   Endo/Heme/Allergies: Negative.   Psychiatric/Behavioral: Negative.      Physical Exam:  BP 138/78 (BP Location: Left Arm)   Pulse 78   Temp 98.6 F (37 C) (Oral)   Resp 19   Ht '5\' 6"'  (1.676 m)   Wt 123 kg   LMP 04/03/2018   SpO2 98%   BMI 43.77 kg/m   Physical Exam  Constitutional: She is oriented to person, place, and time. She appears well-developed and well-nourished. No distress.  Obese in no acute distress  HENT:  Head: Normocephalic and atraumatic.  Eyes: Pupils are equal, round, and reactive to light. EOM are normal. Right eye exhibits no discharge. Left eye exhibits no discharge. No scleral icterus.  Neck: Normal range of motion. Neck supple.  Cardiovascular: Normal rate and regular rhythm.  Pulmonary/Chest: Effort normal. No respiratory distress.  Abdominal: Soft. She exhibits no distension and no mass. There is  no tenderness. There is no rebound and no guarding.  Completely nontender negative Murphy sign  Musculoskeletal: Normal range of motion. She exhibits no edema.  Neurological: She is alert and oriented to person, place, and time.  Skin: Skin is warm and dry. She is not diaphoretic.  Vitals reviewed.     Results for orders placed or performed during the hospital encounter of 07/25/18 (from the past 48 hour(s))  Basic metabolic panel     Status: Abnormal   Collection Time: 07/25/18  2:40 PM  Result Value Ref Range   Sodium 135 135 - 145 mmol/L    Potassium 3.7 3.5 - 5.1 mmol/L   Chloride 102 98 - 111 mmol/L   CO2 23 22 - 32 mmol/L   Glucose, Bld 253 (H) 70 - 99 mg/dL   BUN 37 (H) 6 - 20 mg/dL   Creatinine, Ser 2.48 (H) 0.44 - 1.00 mg/dL   Calcium 9.3 8.9 - 10.3 mg/dL   GFR calc non Af Amer 22 (L) >60 mL/min   GFR calc Af Amer 26 (L) >60 mL/min    Comment: (NOTE) The eGFR has been calculated using the CKD EPI equation. This calculation has not been validated in all clinical situations. eGFR's persistently <60 mL/min signify possible Chronic Kidney Disease.    Anion gap 10 5 - 15    Comment: Performed at [facility], [address] [place], [place] [address]  CBC     Status: Abnormal   Collection Time: 07/25/18  2:40 PM  Result Value Ref Range   WBC 5.7 3.6 - 11.0 K/uL   RBC 5.35 (H) 3.80 - 5.20 MIL/uL   Hemoglobin 12.5 12.0 - 16.0 g/dL   HCT 39.1 35.0 - 47.0 %   MCV 73.1 (L) 80.0 - 100.0 fL   MCH 23.4 (L) 26.0 - 34.0 pg   MCHC 32.0 32.0 - 36.0 g/dL   RDW 14.5 11.5 - 14.5 %   Platelets 182 150 - 440 K/uL    Comment: Performed at [facility], [address], [place], [place] [address]  Hepatic function panel     Status: Abnormal   Collection Time: 07/25/18  2:40 PM  Result Value Ref Range   Total Protein 7.2 6.5 - 8.1 g/dL   Albumin 3.9 3.5 - 5.0 g/dL   AST 32 15 - 41 U/L   ALT 32 0 - 44 U/L   Alkaline Phosphatase 59 38 - 126 U/L   Total Bilirubin 1.6 (H) 0.3 - 1.2 mg/dL   Bilirubin, Direct 0.2 0.0 - 0.2 mg/dL   Indirect Bilirubin 1.4 (H) 0.3 - 0.9 mg/dL    Comment: Performed at [facility], [address] [place], [place] [address]  CK     Status: None   Collection Time: 07/25/18  2:40 PM  Result Value Ref Range   Total CK 141 38 - 234 U/L    Comment: Performed at [facility], [address] [place], [place] [address]  Glucose, capillary     Status: Abnormal   Collection Time: 07/25/18  2:44 PM  Result Value Ref Range   Glucose-Capillary 237 (H) 70 - 99 mg/dL   Urinalysis, Complete w Microscopic     Status: Abnormal   Collection Time: 07/25/18  4:03 PM  Result Value Ref Range   Color, Urine YELLOW (A) YELLOW   APPearance CLOUDY (A) CLEAR   Specific Gravity, Urine 1.013 1.005 - 1.030   pH 5.0 5.0 - 8.0  Glucose, UA NEGATIVE NEGATIVE mg/dL   Hgb urine dipstick NEGATIVE NEGATIVE   Bilirubin Urine NEGATIVE NEGATIVE   Ketones, ur NEGATIVE NEGATIVE mg/dL   Protein, ur NEGATIVE NEGATIVE mg/dL   Nitrite NEGATIVE NEGATIVE   Leukocytes, UA NEGATIVE NEGATIVE   RBC / HPF 0-5 0 - 5 RBC/hpf   WBC, UA 0-5 0 - 5 WBC/hpf   Bacteria, UA RARE (A) NONE SEEN   Squamous Epithelial / LPF 0-5 0 - 5   Mucus PRESENT    Uric Acid Crys, UA PRESENT     Comment: Performed at [facility], [address] [place], [place] [address]  Glucose, capillary     Status: Abnormal   Collection Time: 07/25/18  7:59 PM  Result Value Ref Range   Glucose-Capillary 130 (H) 70 - 99 mg/dL   Comment 1 Notify RN    Comment 2 Document in Chart    US Renal  Result Date: 07/25/2018 CLINICAL DATA:  Acute renal injury and dehydration EXAM: RENAL / URINARY TRACT ULTRASOUND COMPLETE COMPARISON:  11/30/2017 CT FINDINGS: Right Kidney: Length: 11 cm. Echogenicity within normal limits. No mass or hydronephrosis visualized. Left Kidney: Length: 11.1 cm. Echogenicity within normal limits. No mass or hydronephrosis visualized. Bladder: Appears normal for degree of bladder distention. Other: Echogenic liver parenchyma compatible with steatosis. IMPRESSION: Echogenic appearance of the adjacent liver compatible with steatosis. Unremarkable renal ultrasound otherwise. Electronically Signed   By: Ashley Royalty M.D.   On: 07/25/2018 18:18   US Abdomen Limited Ruq  Result Date: 07/25/2018 CLINICAL DATA:  Unspecified disorder of liver function. Elevated liver function tests and bilirubin. EXAM: ULTRASOUND ABDOMEN LIMITED RIGHT UPPER QUADRANT COMPARISON:  CT 11/30/2017 FINDINGS: Gallbladder: Biliary  sludge and layering calculi are identified without pericholecystic fluid or wall thickening. The single wall thickness of the gallbladder is normal at 2 mm. Largest calcification identified within the gallbladder is approximately 6.4 mm. Common bile duct: Diameter: Normal at 5.3 mm Liver: Coarsened echotexture of the liver without space-occupying mass. No biliary dilatation. Portal vein is patent on color Doppler imaging with normal direction of blood flow towards the liver. IMPRESSION: Uncomplicated cholelithiasis. Electronically Signed   By: Ashley Royalty M.D.   On: 07/25/2018 18:14    Assessment/Plan:  Ultrasound shows stones with no pericholecystic fluid or thickened wall.  AST and ALT are normal but bilirubin elevated 1.6.  Normal white blood cell count but abnormal creatinine.  There is no evidence that her gallstones are causing her any symptoms at this time.  I would not recommend surgical intervention but patient could be followed up in the office should symptoms become more evident of her stones causing her any symptoms.  Will sign off but please reconsult if needed  Florene Glen, MD, FACS

## 2018-07-25 NOTE — ED Triage Notes (Signed)
Pt reports hyperglycemia X 1 week. Pt started with sinus congestion toady. Pt alert and oriented X4, active, cooperative, pt in NAD. RR even and unlabored, color WNL.

## 2018-07-25 NOTE — Progress Notes (Signed)
Advanced care plan.  Purpose of the Encounter: CODE STATUS  Parties in Attendance: Patient and family  Patient's Decision Capacity: Good  Subjective/Patient's story: Presented to the emergency room for nausea, elevated blood sugar  Objective/Medical story Has renal failure. Needs IV fluid hydration   Goals of care determination:  Advance care directives, goals of care and plan of management discussed Patient wants everything done which includes CPR, intubation if the need arises   CODE STATUS: Full code   Time spent discussing advanced care planning: 16 minutes

## 2018-07-26 LAB — HEPATIC FUNCTION PANEL
ALK PHOS: 53 U/L (ref 38–126)
ALT: 29 U/L (ref 0–44)
AST: 24 U/L (ref 15–41)
Albumin: 3.6 g/dL (ref 3.5–5.0)
BILIRUBIN TOTAL: 1.4 mg/dL — AB (ref 0.3–1.2)
Bilirubin, Direct: 0.3 mg/dL — ABNORMAL HIGH (ref 0.0–0.2)
Indirect Bilirubin: 1.1 mg/dL — ABNORMAL HIGH (ref 0.3–0.9)
Total Protein: 6.7 g/dL (ref 6.5–8.1)

## 2018-07-26 LAB — CBC
HEMATOCRIT: 36 % (ref 35.0–47.0)
HEMOGLOBIN: 11.6 g/dL — AB (ref 12.0–16.0)
MCH: 23.5 pg — AB (ref 26.0–34.0)
MCHC: 32.1 g/dL (ref 32.0–36.0)
MCV: 73 fL — ABNORMAL LOW (ref 80.0–100.0)
Platelets: 143 10*3/uL — ABNORMAL LOW (ref 150–440)
RBC: 4.93 MIL/uL (ref 3.80–5.20)
RDW: 14.3 % (ref 11.5–14.5)
WBC: 4.5 10*3/uL (ref 3.6–11.0)

## 2018-07-26 LAB — BASIC METABOLIC PANEL
ANION GAP: 7 (ref 5–15)
BUN: 29 mg/dL — AB (ref 6–20)
CO2: 25 mmol/L (ref 22–32)
Calcium: 8.7 mg/dL — ABNORMAL LOW (ref 8.9–10.3)
Chloride: 106 mmol/L (ref 98–111)
Creatinine, Ser: 1.61 mg/dL — ABNORMAL HIGH (ref 0.44–1.00)
GFR calc Af Amer: 43 mL/min — ABNORMAL LOW (ref >60)
GFR calc non Af Amer: 37 mL/min — ABNORMAL LOW (ref >60)
GLUCOSE: 214 mg/dL — AB (ref 70–99)
POTASSIUM: 3.4 mmol/L — AB (ref 3.5–5.1)
Sodium: 138 mmol/L (ref 135–145)

## 2018-07-26 LAB — GLUCOSE, CAPILLARY
Glucose-Capillary: 199 mg/dL — ABNORMAL HIGH (ref 70–99)
Glucose-Capillary: 281 mg/dL — ABNORMAL HIGH (ref 70–99)

## 2018-07-26 NOTE — Discharge Instructions (Signed)
Keep yourself hydrated 

## 2018-07-26 NOTE — Discharge Summary (Signed)
[place] [facility] [place] NAME: Grace Gallagher    MR#:  [id number]  DATE OF BIRTH:  1971-06-29  DATE OF ADMISSION:  07/25/2018 ADMITTING PHYSICIAN: Saundra Shelling, MD  DATE OF DISCHARGE: 07/26/2018  PRIMARY CARE PHYSICIAN: [facility]    ADMISSION DIAGNOSIS:  Nausea [R11.0] AKI (acute kidney injury) ([place]) [N17.9] Elevated bilirubin [R17]  DISCHARGE DIAGNOSIS:  Acute Renal Failure due to dehydration  Suspected viral syndrome Incidental finding of Gallstones on USG abdomen SECONDARY DIAGNOSIS:   Past Medical History:  Diagnosis Date  . Anemia   . Diabetes mellitus without complication ([place])   . Hypertension   . Obesity     HOSPITAL COURSE:   Grace Gallagher is a 47 y.o. female history of diabetes and hypertension presents the ER with chief complaint of nausea decreased appetite and malaise for the past week.    47 year old female patient with history of diabetes mellitus, hypertension presented to the emergency room for nausea, general malaise  * Acute kidney injury from dehydration, poor po appetite, meds and elevated sugars Hold diuretics and metformin Received IV fluid hydration Follow-up renal function creatinine trending down Renal ultrasound showed no stones Eating well  *diarrhea  Mushy stools since yday likely viral syndrome given her presentation -encouraged po hydration -prn imodium if needed  -Cholelithiasis--Incidental finding Surgery consult appreciated--no surgey indication folllow as needed Follow-up liver function tests stable  -Diabetes mellitus type 2 Oral diabetic meds on hold for renal failure--resume metformin from tomorrow Cont Trulicity Sliding scale coverage with insulin  -DVT prophylaxis subcu heparin  D/w pt and son CONSULTS OBTAINED:    DRUG ALLERGIES:   Allergies  Allergen Reactions  . Hydrocodone Nausea Only    DISCHARGE MEDICATIONS:    Allergies as of 07/26/2018      Reactions   Hydrocodone Nausea Only      Medication List    STOP taking these medications   hydrochlorothiazide 25 MG tablet Commonly known as:  HYDRODIURIL   traMADol 50 MG tablet Commonly known as:  ULTRAM     TAKE these medications   albuterol 108 (90 Base) MCG/ACT inhaler Commonly known as:  PROVENTIL HFA;VENTOLIN HFA Inhale 2 puffs into the lungs every 4 (four) hours as needed for wheezing or shortness of breath.   amLODipine 10 MG tablet Commonly known as:  NORVASC Take 1 tablet (10 mg total) by mouth daily.   atorvastatin 40 MG tablet Commonly known as:  LIPITOR Take 40 mg by mouth daily.   ferrous sulfate 325 (65 FE) MG tablet Take 1 tablet (325 mg total) by mouth daily. What changed:  when to take this   furosemide 40 MG tablet Commonly known as:  LASIX Take 40 mg by mouth daily as needed for fluid or edema.   losartan-hydrochlorothiazide 100-25 MG tablet Commonly known as:  HYZAAR Take 1 tablet by mouth daily. Notes to patient:  Hold if BP <115   metFORMIN 1000 MG tablet Commonly known as:  GLUCOPHAGE Take 1,000 mg by mouth 2 (two) times daily with a meal. Notes to patient:  Start taking form tomorrow   metoprolol succinate 50 MG 24 hr tablet Commonly known as:  TOPROL-XL Take 50 mg by mouth daily.   potassium chloride SA 20 MEQ tablet Commonly known as:  K-DUR,KLOR-CON Take 2 tablets (40 mEq total) by mouth once for 1 dose. What changed:    when to take this  reasons to take this  TRULICITY 7.10 GY/6.9SW Sopn Generic drug:  Dulaglutide Inject 0.75 mg into the skin once a week.   ZOFRAN 4 MG tablet Generic drug:  ondansetron Take 4 mg by mouth every 8 (eight) hours as needed for nausea/vomiting.       If you experience worsening of your admission symptoms, develop shortness of breath, life threatening emergency, suicidal or homicidal thoughts you must seek medical attention immediately by calling 911  or calling your MD immediately  if symptoms less severe.  You Must read complete instructions/literature along with all the possible adverse reactions/side effects for all the Medicines you take and that have been prescribed to you. Take any new Medicines after you have completely understood and accept all the possible adverse reactions/side effects.   Please note  You were cared for by a hospitalist during your hospital stay. If you have any questions about your discharge medications or the care you received while you were in the hospital after you are discharged, you can call the unit and asked to speak with the hospitalist on call if the hospitalist that took care of you is not available. Once you are discharged, your primary care physician will handle any further medical issues. Please note that NO REFILLS for any discharge medications will be authorized once you are discharged, as it is imperative that you return to your primary care physician (or establish a relationship with a primary care physician if you do not have one) for your aftercare needs so that they can reassess your need for medications and monitor your lab values. Today   SUBJECTIVE   Feels better Some mushy stools  no abd pain or fever  VITAL SIGNS:  Blood pressure 102/76, pulse 65, temperature 98.3 F (36.8 C), temperature source Oral, resp. rate 16, height 5\' 6"  (1.676 m), weight 118.5 kg, last menstrual period 04/03/2018, SpO2 99 %.  I/O:    Intake/Output Summary (Last 24 hours) at 07/26/2018 1313 Last data filed at 07/26/2018 0900 Gross per 24 hour  Intake 2190 ml  Output -  Net 2190 ml    PHYSICAL EXAMINATION:  GENERAL:  47 y.o.-year-old patient lying in the bed with no acute distress. obese EYES: Pupils equal, round, reactive to light and accommodation. No scleral icterus. Extraocular muscles intact.  HEENT: Head atraumatic, normocephalic. Oropharynx and nasopharynx clear.  NECK:  Supple, no jugular venous  distention. No thyroid enlargement, no tenderness.  LUNGS: Normal breath sounds bilaterally, no wheezing, rales,rhonchi or crepitation. No use of accessory muscles of respiration.  CARDIOVASCULAR: S1, S2 normal. No murmurs, rubs, or gallops.  ABDOMEN: Soft, non-tender, non-distended. Bowel sounds present. No organomegaly or mass.  EXTREMITIES: No pedal edema, cyanosis, or clubbing.  NEUROLOGIC: Cranial nerves II through XII are intact. Muscle strength 5/5 in all extremities. Sensation intact. Gait not checked.  PSYCHIATRIC: The patient is alert and oriented x 3.  SKIN: No obvious rash, lesion, or ulcer.   DATA REVIEW:   CBC  Recent Labs  Lab 07/26/18 0426  WBC 4.5  HGB 11.6*  HCT 36.0  PLT 143*    Chemistries  Recent Labs  Lab 07/26/18 0426  NA 138  K 3.4*  CL 106  CO2 25  GLUCOSE 214*  BUN 29*  CREATININE 1.61*  CALCIUM 8.7*  AST 24  ALT 29  ALKPHOS 53  BILITOT 1.4*    Microbiology Results   Recent Results (from the past 240 hour(s))  Urine Culture     Status: Abnormal   Collection Time: 07/16/18  1:51 PM  Result Value Ref Range Status   Specimen Description   Final    URINE, RANDOM Performed at [facility], [address] [place], [place] [address]    Special Requests   Final    Normal Performed at [facility], [address] [place], [place] [address]    Culture MULTIPLE SPECIES PRESENT, SUGGEST RECOLLECTION (A)  Final   Report Status 07/18/2018 FINAL  Final    RADIOLOGY:  US Renal  Result Date: 07/25/2018 CLINICAL DATA:  Acute renal injury and dehydration EXAM: RENAL / URINARY TRACT ULTRASOUND COMPLETE COMPARISON:  11/30/2017 CT FINDINGS: Right Kidney: Length: 11 cm. Echogenicity within normal limits. No mass or hydronephrosis visualized. Left Kidney: Length: 11.1 cm. Echogenicity within normal limits. No mass or hydronephrosis visualized. Bladder: Appears normal for degree of bladder distention. Other: Echogenic liver parenchyma  compatible with steatosis. IMPRESSION: Echogenic appearance of the adjacent liver compatible with steatosis. Unremarkable renal ultrasound otherwise. Electronically Signed   By: Ashley Royalty M.D.   On: 07/25/2018 18:18   US Abdomen Limited Ruq  Result Date: 07/25/2018 CLINICAL DATA:  Unspecified disorder of liver function. Elevated liver function tests and bilirubin. EXAM: ULTRASOUND ABDOMEN LIMITED RIGHT UPPER QUADRANT COMPARISON:  CT 11/30/2017 FINDINGS: Gallbladder: Biliary sludge and layering calculi are identified without pericholecystic fluid or wall thickening. The single wall thickness of the gallbladder is normal at 2 mm. Largest calcification identified within the gallbladder is approximately 6.4 mm. Common bile duct: Diameter: Normal at 5.3 mm Liver: Coarsened echotexture of the liver without space-occupying mass. No biliary dilatation. Portal vein is patent on color Doppler imaging with normal direction of blood flow towards the liver. IMPRESSION: Uncomplicated cholelithiasis. Electronically Signed   By: Ashley Royalty M.D.   On: 07/25/2018 18:14     Management plans discussed with the patient, family and they are in agreement.  CODE STATUS:     Code Status Orders  (From admission, onward)         Start     Ordered   07/25/18 2108  Full code  Continuous     07/25/18 2107        Code Status History    This patient has a current code status but no historical code status.      TOTAL TIME TAKING CARE OF THIS PATIENT: *40* minutes.    Fritzi Mandes M.D on 07/26/2018 at 1:13 PM  Between 7am to 6pm - Pager - [phone] After 6pm go to [email or web address] - password [email or web address] [facility]  Office  [phone]  CC: Primary care physician; [facility] [place]

## 2018-07-26 NOTE — Discharge Planning (Signed)
Patient IV removed.  RN assessment and VS revealed stability for DC to home.  Discharge papers give, explained and educated.  No FU appts needed at this time. No scripts needed at this time.  Once ready, will be wheeled to front and family transporting home via car.

## 2018-07-27 LAB — HIV ANTIBODY (ROUTINE TESTING W REFLEX): HIV SCREEN 4TH GENERATION: NONREACTIVE

## 2018-09-03 HISTORY — PX: GALLBLADDER SURGERY: SHX652

## 2018-10-23 ENCOUNTER — Emergency Department (HOSPITAL_COMMUNITY): Payer: Commercial Managed Care - PPO

## 2018-10-23 ENCOUNTER — Encounter (HOSPITAL_COMMUNITY): Payer: Self-pay | Admitting: Emergency Medicine

## 2018-10-23 ENCOUNTER — Emergency Department (HOSPITAL_COMMUNITY)
Admission: EM | Admit: 2018-10-23 | Discharge: 2018-10-24 | Disposition: A | Discharge status: Home / Self Care | Payer: Commercial Managed Care - PPO | Attending: Emergency Medicine | Admitting: Emergency Medicine

## 2018-10-23 DIAGNOSIS — E1165 Type 2 diabetes mellitus with hyperglycemia: Secondary | ICD-10-CM | POA: Diagnosis present

## 2018-10-23 DIAGNOSIS — I10 Essential (primary) hypertension: Secondary | ICD-10-CM | POA: Insufficient documentation

## 2018-10-23 DIAGNOSIS — Z79899 Other long term (current) drug therapy: Secondary | ICD-10-CM | POA: Insufficient documentation

## 2018-10-23 DIAGNOSIS — R739 Hyperglycemia, unspecified: Secondary | ICD-10-CM

## 2018-10-23 DIAGNOSIS — Z7984 Long term (current) use of oral hypoglycemic drugs: Secondary | ICD-10-CM | POA: Diagnosis not present

## 2018-10-23 LAB — CBC WITH DIFFERENTIAL/PLATELET
ABS IMMATURE GRANULOCYTES: 0.02 10*3/uL (ref 0.00–0.07)
BASOS ABS: 0 10*3/uL (ref 0.0–0.1)
BASOS PCT: 0 %
Eosinophils Absolute: 0.2 10*3/uL (ref 0.0–0.5)
Eosinophils Relative: 3 %
HCT: 43.6 % (ref 36.0–46.0)
Hemoglobin: 13.5 g/dL (ref 12.0–15.0)
IMMATURE GRANULOCYTES: 0 %
Lymphocytes Relative: 45 %
Lymphs Abs: 2.9 10*3/uL (ref 0.7–4.0)
MCH: 23.4 pg — ABNORMAL LOW (ref 26.0–34.0)
MCHC: 31 g/dL (ref 30.0–36.0)
MCV: 75.4 fL — AB (ref 80.0–100.0)
MONOS PCT: 9 %
Monocytes Absolute: 0.6 10*3/uL (ref 0.1–1.0)
NEUTROS ABS: 2.8 10*3/uL (ref 1.7–7.7)
NEUTROS PCT: 43 %
PLATELETS: 184 10*3/uL (ref 150–400)
RBC: 5.78 MIL/uL — AB (ref 3.87–5.11)
RDW: 13.4 % (ref 11.5–15.5)
WBC: 6.5 10*3/uL (ref 4.0–10.5)
nRBC: 0 % (ref 0.0–0.2)

## 2018-10-23 LAB — I-STAT VENOUS BLOOD GAS, ED
Acid-Base Excess: 1 mmol/L (ref 0.0–2.0)
Bicarbonate: 26.7 mmol/L (ref 20.0–28.0)
O2 Saturation: 85 %
PCO2 VEN: 46 mmHg (ref 44.0–60.0)
PO2 VEN: 53 mmHg — AB (ref 32.0–45.0)
TCO2: 28 mmol/L (ref 22–32)
pH, Ven: 7.371 (ref 7.250–7.430)

## 2018-10-23 LAB — CBG MONITORING, ED: GLUCOSE-CAPILLARY: 226 mg/dL — AB (ref 70–99)

## 2018-10-23 LAB — BASIC METABOLIC PANEL
ANION GAP: 11 (ref 5–15)
BUN: 15 mg/dL (ref 6–20)
CO2: 25 mmol/L (ref 22–32)
Calcium: 9.8 mg/dL (ref 8.9–10.3)
Chloride: 101 mmol/L (ref 98–111)
Creatinine, Ser: 0.88 mg/dL (ref 0.44–1.00)
Glucose, Bld: 234 mg/dL — ABNORMAL HIGH (ref 70–99)
POTASSIUM: 3.6 mmol/L (ref 3.5–5.1)
SODIUM: 137 mmol/L (ref 135–145)

## 2018-10-23 MED ORDER — SODIUM CHLORIDE 0.9 % IV BOLUS
1000.0000 mL | Freq: Once | INTRAVENOUS | Status: AC
  Administered 2018-10-23: 1000 mL via INTRAVENOUS

## 2018-10-23 MED ORDER — ONDANSETRON HCL 4 MG/2ML IJ SOLN
4.0000 mg | Freq: Once | INTRAMUSCULAR | Status: AC
  Administered 2018-10-23: 4 mg via INTRAVENOUS
  Filled 2018-10-23: qty 2

## 2018-10-23 NOTE — ED Triage Notes (Signed)
Pt states she has been nauseated, urinating frequently and drinking a lot of water

## 2018-10-23 NOTE — ED Provider Notes (Signed)
[facility] EMERGENCY DEPARTMENT Provider Note   CSN: [id number] Arrival date & time: 10/23/18  2239     History   Chief Complaint Chief Complaint  Patient presents with  . Hyperglycemia    HPI Grace Gallagher is a 47 y.o. female.  The history is provided by the patient and medical records.  Hyperglycemia  Associated symptoms: increased thirst, nausea and polyuria      47 y.o. F with hx of anemia, DM, HTN, obesity, presenting to the ED for hyperglycemia.  States she has been feeling poorly for about 2 weeks.  States she has had some nausea, vomiting, frequent urination, and increased thirst.  She saw her doctor last week who recommended to increase her trulicity but no other medication changes.  States she has been able to eat/drink some at home, trying to mostly drink water.  States she has had a cough but no fever/chills.  She denies any other URI symptoms.  No abdominal pain.  No chest pain or SOB.  States CBG's recently have been around 350-400 range, usually her blood sugar is around 150 on a good day.  Past Medical History:  Diagnosis Date  . Anemia   . Diabetes mellitus without complication ([place])   . Hypertension   . Obesity     Patient Active Problem List   Diagnosis Date Noted  . AKI (acute kidney injury) ([place]) 07/25/2018  . Elevated bilirubin   . Gallstones   . Fibroid 04/05/2018  . Lower respiratory infection 07/07/2015  . Bacterial vaginosis 07/07/2015  . Edema 03/26/2015  . Anal itching 10/14/2014  . Head or neck swelling, mass, or lump 09/25/2014  . Vaginitis and vulvovaginitis 09/10/2014  . Dizziness and giddiness 06/02/2014  . Other malaise and fatigue 05/30/2014  . Acute sinusitis with symptoms > 10 days 04/09/2014  . Essential hypertension, malignant 02/04/2014  . SOB (shortness of breath) 02/04/2014  . Allergic rhinitis 01/29/2014  . Hypokalemia 01/10/2014  . Back pain 01/06/2014  . Bilateral lower abdominal cramping  08/29/2013  . Menorrhagia 08/29/2013  . Encounter for routine gynecological examination 07/01/2013  . Headache(784.0) 03/31/2013  . HTN (hypertension) 02/21/2013  . Obesity, unspecified 02/21/2013  . Microcytic anemia 02/21/2013    Past Surgical History:  Procedure Laterality Date  . ABDOMINAL HYSTERECTOMY    . BREAST SURGERY  08/2011   breast reduction  . CYSTOSCOPY  04/12/2018   Procedure: CYSTOSCOPY;  Surgeon: Gae Dry, MD;  Location: [facility] ORS;  Service: Gynecology;;  . FOOT SURGERY Left    cyst removed  . LAPAROSCOPIC HYSTERECTOMY Bilateral 04/12/2018   Procedure: HYSTERECTOMY TOTAL LAPAROSCOPIC BILATERAL SALPINGECTOMY;  Surgeon: Gae Dry, MD;  Location: [facility] ORS;  Service: Gynecology;  Laterality: Bilateral;  . TUBAL LIGATION       OB History    Gravida  2   Para  2   Term  0   Preterm  2   AB      Living  2     SAB      TAB      Ectopic      Multiple      Live Births  2            Home Medications    Prior to Admission medications   Medication Sig Start Date End Date Taking? Authorizing Provider  albuterol (PROVENTIL HFA;VENTOLIN HFA) 108 (90 Base) MCG/ACT inhaler Inhale 2 puffs into the lungs every 4 (four) hours as needed for wheezing  or shortness of breath.    [provider]  amLODipine (NORVASC) 10 MG tablet Take 1 tablet (10 mg total) by mouth daily. 09/10/14   Lucille Passy, MD  atorvastatin (LIPITOR) 40 MG tablet Take 40 mg by mouth daily.     [provider]  Dulaglutide (TRULICITY) 1.95 KD/3.2IZ SOPN Inject 0.75 mg into the skin once a week.    [provider]  ferrous sulfate 325 (65 FE) MG tablet Take 1 tablet (325 mg total) by mouth daily. Patient taking differently: Take 325 mg by mouth 2 (two) times daily.  03/10/18   Keitha Butte, CNM  furosemide (LASIX) 40 MG tablet Take 40 mg by mouth daily as needed for fluid or edema.  05/14/18   [provider]  losartan-hydrochlorothiazide  (HYZAAR) 100-25 MG tablet Take 1 tablet by mouth daily. 07/18/18   [provider]  metFORMIN (GLUCOPHAGE) 1000 MG tablet Take 1,000 mg by mouth 2 (two) times daily with a meal.    [provider]  metoprolol succinate (TOPROL-XL) 50 MG 24 hr tablet Take 50 mg by mouth daily.  06/01/16   [provider]  potassium chloride SA (K-DUR,KLOR-CON) 20 MEQ tablet Take 2 tablets (40 mEq total) by mouth once for 1 dose. Patient taking differently: Take 40 mEq by mouth daily as needed (when taking Lasix).  06/10/18 07/18/26  Recardo Evangelist, PA-C  ZOFRAN 4 MG tablet Take 4 mg by mouth every 8 (eight) hours as needed for nausea/vomiting. 07/23/18   [provider]    Family History Family History  Problem Relation Age of Onset  . Hypertension Mother   . Heart failure Mother   . Heart attack Mother   . Cancer Father     Social History Social History   Tobacco Use  . Smoking status: Never Smoker  . Smokeless tobacco: Never Used  Substance Use Topics  . Alcohol use: No  . Drug use: No     Allergies   Hydrocodone   Review of Systems Review of Systems  Respiratory: Positive for cough.   Gastrointestinal: Positive for nausea.  Endocrine: Positive for polydipsia and polyuria.  All other systems reviewed and are negative.    Physical Exam Updated Vital Signs LMP 04/03/2018   Physical Exam  Constitutional: She is oriented to person, place, and time. She appears well-developed and well-nourished.  HENT:  Head: Normocephalic and atraumatic.  Mouth/Throat: Oropharynx is clear and moist.  Eyes: Pupils are equal, round, and reactive to light. Conjunctivae and EOM are normal.  Neck: Normal range of motion.  Cardiovascular: Normal rate, regular rhythm and normal heart sounds.  Pulmonary/Chest: Effort normal and breath sounds normal. No stridor. No respiratory distress.  Abdominal: Soft. Bowel sounds are normal. There is no tenderness. There is no rebound.    Musculoskeletal: Normal range of motion.  Neurological: She is alert and oriented to person, place, and time.  Skin: Skin is warm and dry.  Psychiatric: She has a normal mood and affect.  Nursing note and vitals reviewed.    ED Treatments / Results  Labs (all labs ordered are listed, but only abnormal results are displayed) Labs Reviewed  CBC WITH DIFFERENTIAL/PLATELET - Abnormal; Notable for the following components:      Result Value   RBC 5.78 (*)    MCV 75.4 (*)    MCH 23.4 (*)    All other components within normal limits  BASIC METABOLIC PANEL - Abnormal; Notable for the following components:  Glucose, Bld 234 (*)    All other components within normal limits  URINALYSIS, ROUTINE W REFLEX MICROSCOPIC - Abnormal; Notable for the following components:   Glucose, UA 150 (*)    All other components within normal limits  I-STAT VENOUS BLOOD GAS, ED - Abnormal; Notable for the following components:   pO2, Ven 53.0 (*)    All other components within normal limits  CBG MONITORING, ED - Abnormal; Notable for the following components:   Glucose-Capillary 226 (*)    All other components within normal limits    EKG None  Radiology Dg Chest 2 View  Result Date: 10/23/2018 CLINICAL DATA:  Nausea and hyperglycemia. EXAM: CHEST - 2 VIEW COMPARISON:  July 16, 2018 FINDINGS: Mild opacity in the right lung base is platelike suggesting atelectasis rather than infiltrate. The heart, hila, mediastinum, lungs, and pleura are otherwise unremarkable. IMPRESSION: Right basilar opacity is most suggestive of atelectasis. Early infiltrate is considered less likely. Electronically Signed   By: Dorise Bullion III M.D   On: 10/23/2018 23:04    Procedures Procedures (including critical care time)  Medications Ordered in ED Medications  sodium chloride 0.9 % bolus 1,000 mL (0 mLs Intravenous Stopped 10/24/18 0026)  ondansetron (ZOFRAN) injection 4 mg (4 mg Intravenous Given 10/23/18 2319)      Initial Impression / Assessment and Plan / ED Course  I have reviewed the triage vital signs and the nursing notes.  Pertinent labs & imaging results that were available during my care of the patient were reviewed by me and considered in my medical decision making (see chart for details).  47 y.o.F here with hyperglycemia over the past 2 weeks.  States sugars have been around 3 50-400, usual sugar is around 150 or less.  Saw her doctor recently and increase her Trulicity.  She does report some urinary frequency and increased thirst.  Also has some ongoing nausea.  She is afebrile and nontoxic in appearance here.  Abdomen is soft and benign.  Lungs are clear without any wheezes or rhonchi.  Vital signs stable.  Labs are overall reassuring with normal anion gap and bicarb.  Venous blood gases within normal limits.  Glucose here is only 230.  Chest x-ray was obtained due to cough, infiltrate noted but felt likely to represent atelectasis.  On chart review, it does appear patient has been having a cough for several months.  I saw her at previous visit and noticed that she was on an ACEI which I recommended her to discuss with her PCP, it does appear that this medication was discontinued.  She has no fever, leukocytosis, or shortness of breath so I agree that pneumonia is less likely.  Feel she is stable for discharge home.  I recommended that she follow-up with her primary care doctor.  Continue to keep a log of her blood sugars.  She will return here for any new/acute changes.  Final Clinical Impressions(s) / ED Diagnoses   Final diagnoses:  Hyperglycemia    ED Discharge Orders         Ordered    ondansetron (ZOFRAN ODT) 4 MG disintegrating tablet  Every 8 hours PRN     10/24/18 0119           Larene Pickett, PA-C 10/24/18 2671    Dorie Rank, MD 10/26/18 1500

## 2018-10-23 NOTE — ED Notes (Signed)
Pt states she took her metformin at 7pm tonight before coming to hospital

## 2018-10-23 NOTE — ED Triage Notes (Signed)
Pt states her CBG at home has been around 400 for the last 2 weeks. Pt takes metformin and trulicity at home, trulicity was recently increased but pt states her CBG is still elevated.

## 2018-10-23 NOTE — ED Notes (Signed)
Patient transported to X-ray 

## 2018-10-24 DIAGNOSIS — G4733 Obstructive sleep apnea (adult) (pediatric): Secondary | ICD-10-CM | POA: Insufficient documentation

## 2018-10-24 LAB — URINALYSIS, ROUTINE W REFLEX MICROSCOPIC
BILIRUBIN URINE: NEGATIVE
GLUCOSE, UA: 150 mg/dL — AB
HGB URINE DIPSTICK: NEGATIVE
KETONES UR: NEGATIVE mg/dL
Leukocytes, UA: NEGATIVE
Nitrite: NEGATIVE
PH: 5 (ref 5.0–8.0)
Protein, ur: NEGATIVE mg/dL
SPECIFIC GRAVITY, URINE: 1.03 (ref 1.005–1.030)

## 2018-10-24 MED ORDER — ONDANSETRON 4 MG PO TBDP: 4.0000 mg | ORAL_TABLET | Freq: Three times a day (TID) | ORAL | 0 refills | Status: DC | PRN

## 2018-10-24 NOTE — Discharge Instructions (Signed)
Continue to keep a log of your blood sugars.  Can use the zofran for nausea if needed. Follow-up with your primary care doctor. Return here for any new/acute changes.

## 2018-10-24 NOTE — ED Notes (Signed)
ED Provider at bedside. 

## 2018-10-24 NOTE — ED Notes (Signed)
Patient verbalizes understanding of discharge instructions. Opportunity for questioning and answers were provided. Armband removed by staff, pt discharged from ED in wheelchair.  

## 2018-10-25 DIAGNOSIS — E785 Hyperlipidemia, unspecified: Secondary | ICD-10-CM

## 2018-10-25 DIAGNOSIS — E1169 Type 2 diabetes mellitus with other specified complication: Secondary | ICD-10-CM | POA: Insufficient documentation

## 2018-11-08 ENCOUNTER — Emergency Department (HOSPITAL_COMMUNITY)
Admission: EM | Admit: 2018-11-08 | Discharge: 2018-11-09 | Disposition: A | Discharge status: Home / Self Care | Payer: Commercial Managed Care - PPO | Attending: Emergency Medicine | Admitting: Emergency Medicine

## 2018-11-08 ENCOUNTER — Encounter (HOSPITAL_COMMUNITY): Payer: Self-pay

## 2018-11-08 ENCOUNTER — Other Ambulatory Visit: Payer: Self-pay

## 2018-11-08 DIAGNOSIS — E1165 Type 2 diabetes mellitus with hyperglycemia: Secondary | ICD-10-CM | POA: Diagnosis not present

## 2018-11-08 DIAGNOSIS — Z794 Long term (current) use of insulin: Secondary | ICD-10-CM | POA: Diagnosis not present

## 2018-11-08 DIAGNOSIS — R739 Hyperglycemia, unspecified: Secondary | ICD-10-CM | POA: Diagnosis present

## 2018-11-08 DIAGNOSIS — I10 Essential (primary) hypertension: Secondary | ICD-10-CM | POA: Diagnosis not present

## 2018-11-08 DIAGNOSIS — Z79899 Other long term (current) drug therapy: Secondary | ICD-10-CM | POA: Insufficient documentation

## 2018-11-08 LAB — CBG MONITORING, ED: GLUCOSE-CAPILLARY: 394 mg/dL — AB (ref 70–99)

## 2018-11-08 NOTE — ED Triage Notes (Signed)
Pt states she was recently placed on Insulin due to her diabetes.  States that her insulin has not been helping.  Prescribed 20 Units at night, per PCP increased to 30 units. CBG at home in the 400s last 2 days.  394 here. A&Ox4

## 2018-11-09 LAB — URINALYSIS, ROUTINE W REFLEX MICROSCOPIC
Bacteria, UA: NONE SEEN
Bilirubin Urine: NEGATIVE
Glucose, UA: >=500 mg/dL — AB
Hgb urine dipstick: NEGATIVE
KETONES UR: NEGATIVE mg/dL
Leukocytes, UA: NEGATIVE
Nitrite: NEGATIVE
Protein, ur: NEGATIVE mg/dL
Specific Gravity, Urine: 1.026 (ref 1.005–1.030)
pH: 6 (ref 5.0–8.0)

## 2018-11-09 LAB — CBC
HEMATOCRIT: 39 % (ref 36.0–46.0)
Hemoglobin: 11.8 g/dL — ABNORMAL LOW (ref 12.0–15.0)
MCH: 22.6 pg — ABNORMAL LOW (ref 26.0–34.0)
MCHC: 30.3 g/dL (ref 30.0–36.0)
MCV: 74.9 fL — AB (ref 80.0–100.0)
Platelets: 178 10*3/uL (ref 150–400)
RBC: 5.21 MIL/uL — ABNORMAL HIGH (ref 3.87–5.11)
RDW: 13.3 % (ref 11.5–15.5)
WBC: 6.7 10*3/uL (ref 4.0–10.5)
nRBC: 0 % (ref 0.0–0.2)

## 2018-11-09 LAB — BASIC METABOLIC PANEL
Anion gap: 11 (ref 5–15)
BUN: 13 mg/dL (ref 6–20)
CHLORIDE: 100 mmol/L (ref 98–111)
CO2: 24 mmol/L (ref 22–32)
Calcium: 8.9 mg/dL (ref 8.9–10.3)
Creatinine, Ser: 0.92 mg/dL (ref 0.44–1.00)
GFR calc Af Amer: >60 mL/min (ref >60)
GLUCOSE: 450 mg/dL — AB (ref 70–99)
POTASSIUM: 3.7 mmol/L (ref 3.5–5.1)
Sodium: 135 mmol/L (ref 135–145)

## 2018-11-09 LAB — CBG MONITORING, ED: Glucose-Capillary: 341 mg/dL — ABNORMAL HIGH (ref 70–99)

## 2018-11-09 LAB — I-STAT BETA HCG BLOOD, ED (MC, WL, AP ONLY): I-stat hCG, quantitative: <5 m[IU]/mL (ref <5)

## 2018-11-09 MED ORDER — HYDROCHLOROTHIAZIDE 25 MG PO TABS
25.0000 mg | ORAL_TABLET | Freq: Once | ORAL | Status: AC
  Administered 2018-11-09: 25 mg via ORAL
  Filled 2018-11-09: qty 1

## 2018-11-09 MED ORDER — SODIUM CHLORIDE 0.9 % IV BOLUS
1000.0000 mL | Freq: Once | INTRAVENOUS | Status: AC
  Administered 2018-11-09: 1000 mL via INTRAVENOUS

## 2018-11-09 MED ORDER — LOSARTAN POTASSIUM 50 MG PO TABS
100.0000 mg | ORAL_TABLET | Freq: Once | ORAL | Status: AC
  Administered 2018-11-09: 100 mg via ORAL
  Filled 2018-11-09: qty 2

## 2018-11-09 MED ORDER — GLIPIZIDE 5 MG PO TABS: 2.5000 mg | ORAL_TABLET | Freq: Every day | ORAL | 0 refills | Status: DC

## 2018-11-09 NOTE — ED Provider Notes (Signed)
[facility] EMERGENCY DEPARTMENT Provider Note   CSN: [id number] Arrival date & time: 11/08/18  2312     History   Chief Complaint Chief Complaint  Patient presents with  . Hyperglycemia    HPI Grace Gallagher is a 47 y.o. female with a hx of anemia, IDDM, HTN, obesity presents to the Emergency Department complaining of gradual, persistent, progressively worsening fatigue and hyperglycemia onset several weeks ago but worsening tonight. Pt reports taking metformin for approx 3 years but after 3-4 weeks of elevated blood glucoses, her endocrinologist added Trulicity last week.  Pt is followed by Dr. Hartford Poli. Before the four-week spike in her glucose, her CBGs normally in the 150-200 range.  Pt reports associated increased urination, thirst, increased hunger, normalized fatigue and occasional lightheadedness.  Pt does report nausea with intermittent vomiting since this began.  She denies her, chills, headache, neck pain, chest pain, shortness of breath, abdominal pain, dizziness, syncope, dysuria..  Last hospitalization for diabetes was Sept 2019.   Pt reports hysterectomy in May and Cholecystomy in Sept 2019.  Pt reports she has previously used glipizide with good results. Additionally, pt reports she is having trouble getting her insurance to cover the new medications.  CBG tonight was 347 before eating and then 464 about 1.5 hours at her.  Pt reports eating a hamberger for dinner.  Recently, patient reports significantly elevated blood sugars even after starting Trulicity.  Continues to take her metformin as directed.  The history is provided by the patient and medical records. No language interpreter was used.    Past Medical History:  Diagnosis Date  . Anemia   . Diabetes mellitus without complication ([place])   . Hypertension   . Obesity     Patient Active Problem List   Diagnosis Date Noted  . AKI (acute kidney injury) ([place]) 07/25/2018  . Elevated bilirubin   .  Gallstones   . Fibroid 04/05/2018  . Lower respiratory infection 07/07/2015  . Bacterial vaginosis 07/07/2015  . Edema 03/26/2015  . Anal itching 10/14/2014  . Head or neck swelling, mass, or lump 09/25/2014  . Vaginitis and vulvovaginitis 09/10/2014  . Dizziness and giddiness 06/02/2014  . Other malaise and fatigue 05/30/2014  . Acute sinusitis with symptoms > 10 days 04/09/2014  . Essential hypertension, malignant 02/04/2014  . SOB (shortness of breath) 02/04/2014  . Allergic rhinitis 01/29/2014  . Hypokalemia 01/10/2014  . Back pain 01/06/2014  . Bilateral lower abdominal cramping 08/29/2013  . Menorrhagia 08/29/2013  . Encounter for routine gynecological examination 07/01/2013  . Headache(784.0) 03/31/2013  . HTN (hypertension) 02/21/2013  . Obesity, unspecified 02/21/2013  . Microcytic anemia 02/21/2013    Past Surgical History:  Procedure Laterality Date  . ABDOMINAL HYSTERECTOMY    . BREAST SURGERY  08/2011   breast reduction  . CYSTOSCOPY  04/12/2018   Procedure: CYSTOSCOPY;  Surgeon: Gae Dry, MD;  Location: [facility] ORS;  Service: Gynecology;;  . FOOT SURGERY Left    cyst removed  . LAPAROSCOPIC HYSTERECTOMY Bilateral 04/12/2018   Procedure: HYSTERECTOMY TOTAL LAPAROSCOPIC BILATERAL SALPINGECTOMY;  Surgeon: Gae Dry, MD;  Location: [facility] ORS;  Service: Gynecology;  Laterality: Bilateral;  . TUBAL LIGATION       OB History    Gravida  2   Para  2   Term  0   Preterm  2   AB      Living  2     SAB      TAB  Ectopic      Multiple      Live Births  2            Home Medications    Prior to Admission medications   Medication Sig Start Date End Date Taking? Authorizing Provider  amLODipine (NORVASC) 10 MG tablet Take 1 tablet (10 mg total) by mouth daily. 09/10/14  Yes Lucille Passy, MD  atorvastatin (LIPITOR) 40 MG tablet Take 40 mg by mouth daily.    Yes [provider]  Dulaglutide (TRULICITY) 1.02 HE/5.2DP SOPN  Inject 0.75 mg into the skin every Thursday.    Yes [provider]  ferrous sulfate 325 (65 FE) MG tablet Take 1 tablet (325 mg total) by mouth daily. Patient taking differently: Take 325 mg by mouth 2 (two) times daily.  03/10/18  Yes Keitha Butte, CNM  furosemide (LASIX) 40 MG tablet Take 40 mg by mouth daily as needed for fluid or edema.  05/14/18  Yes [provider]  losartan-hydrochlorothiazide (HYZAAR) 100-25 MG tablet Take 1 tablet by mouth daily. 07/18/18  Yes [provider]  metFORMIN (GLUCOPHAGE) 1000 MG tablet Take 1,000 mg by mouth 2 (two) times daily with a meal.   Yes [provider]  metoprolol succinate (TOPROL-XL) 50 MG 24 hr tablet Take 50 mg by mouth daily.  06/01/16  Yes [provider]  ondansetron (ZOFRAN ODT) 4 MG disintegrating tablet Take 1 tablet (4 mg total) by mouth every 8 (eight) hours as needed for nausea. 10/24/18  Yes Larene Pickett, PA-C  glipiZIDE (GLUCOTROL) 5 MG tablet Take 0.5 tablets (2.5 mg total) by mouth daily before breakfast. 11/09/18   Mouhamadou Gittleman, Jarrett Soho, PA-C  potassium chloride SA (K-DUR,KLOR-CON) 20 MEQ tablet Take 2 tablets (40 mEq total) by mouth once for 1 dose. Patient not taking: Reported on 10/23/2018 06/10/18 07/18/26  Recardo Evangelist, PA-C    Family History Family History  Problem Relation Age of Onset  . Hypertension Mother   . Heart failure Mother   . Heart attack Mother   . Cancer Father     Social History Social History   Tobacco Use  . Smoking status: Never Smoker  . Smokeless tobacco: Never Used  Substance Use Topics  . Alcohol use: No  . Drug use: No     Allergies   Hydrocodone   Review of Systems Review of Systems  Constitutional: Positive for fever. Negative for appetite change, diaphoresis, fatigue and unexpected weight change.  HENT: Negative for mouth sores.   Eyes: Negative for visual disturbance.  Respiratory: Negative for cough, chest tightness, shortness  of breath and wheezing.   Cardiovascular: Negative for chest pain.  Gastrointestinal: Positive for nausea and vomiting. Negative for abdominal pain, constipation and diarrhea.  Endocrine: Positive for polydipsia, polyphagia and polyuria.  Genitourinary: Negative for dysuria, frequency, hematuria and urgency.  Musculoskeletal: Negative for back pain and neck stiffness.  Skin: Negative for rash.  Allergic/Immunologic: Negative for immunocompromised state.  Neurological: Positive for light-headedness. Negative for syncope and headaches.  Hematological: Does not bruise/bleed easily.  Psychiatric/Behavioral: Negative for sleep disturbance. The patient is not nervous/anxious.      Physical Exam Updated Vital Signs BP (!) 190/90   Pulse 82   Temp 98.5 F (36.9 C)   Resp 16   LMP 04/03/2018   SpO2 100%   Physical Exam  Constitutional: She appears well-developed and well-nourished. No distress.  Awake, alert, nontoxic appearance Tired appearing  HENT:  Head: Normocephalic and atraumatic.  Mouth/Throat: Oropharynx is clear and moist. No oropharyngeal exudate.  Eyes: Conjunctivae are normal. No scleral icterus.  Neck: Normal range of motion. Neck supple.  Cardiovascular: Normal rate, regular rhythm and intact distal pulses.  Pulmonary/Chest: Effort normal and breath sounds normal. No respiratory distress. She has no wheezes.  Equal chest expansion  Abdominal: Soft. Bowel sounds are normal. She exhibits no mass. There is no tenderness. There is no rebound and no guarding.  Musculoskeletal: Normal range of motion. She exhibits edema.  Nonpitting edema of the bilateral lower extremities No calf tenderness  Neurological: She is alert.  Speech is clear and goal oriented Moves extremities without ataxia  Skin: Skin is warm and dry. She is not diaphoretic.  Psychiatric: She has a normal mood and affect.  Nursing note and vitals reviewed.    ED Treatments / Results  Labs (all labs  ordered are listed, but only abnormal results are displayed) Labs Reviewed  BASIC METABOLIC PANEL - Abnormal; Notable for the following components:      Result Value   Glucose, Bld 450 (*)    All other components within normal limits  CBC - Abnormal; Notable for the following components:   RBC 5.21 (*)    Hemoglobin 11.8 (*)    MCV 74.9 (*)    MCH 22.6 (*)    All other components within normal limits  URINALYSIS, ROUTINE W REFLEX MICROSCOPIC - Abnormal; Notable for the following components:   Color, Urine STRAW (*)    Glucose, UA >=500 (*)    All other components within normal limits  CBG MONITORING, ED - Abnormal; Notable for the following components:   Glucose-Capillary 394 (*)    All other components within normal limits  CBG MONITORING, ED - Abnormal; Notable for the following components:   Glucose-Capillary 341 (*)    All other components within normal limits  I-STAT BETA HCG BLOOD, ED ([facility], [facility], [facility] ONLY)  CBG MONITORING, ED     Procedures Procedures (including critical care time)  Medications Ordered in ED Medications  losartan (COZAAR) tablet 100 mg (100 mg Oral Given 11/09/18 0231)  hydrochlorothiazide (HYDRODIURIL) tablet 25 mg (25 mg Oral Given 11/09/18 0057)  sodium chloride 0.9 % bolus 1,000 mL (0 mLs Intravenous Stopped 11/09/18 0221)     Initial Impression / Assessment and Plan / ED Course  I have reviewed the triage vital signs and the nursing notes.  Pertinent labs & imaging results that were available during my care of the patient were reviewed by me and considered in my medical decision making (see chart for details).  Clinical Course as of Nov 09 512  Fri Nov 09, 2018  0049 normal  Creatinine: 0.92 [HM]  0049 No keytones  Ketones, ur: NEGATIVE [HM]  6761 Not far from baseline  Hemoglobin(!): 11.8 [HM]  0049 Pt reports this is normal.  She has not taken all her HTN medications today  BP(!): 190/90 [HM]  0513 improved blood pressure after home  medication administration.  BP(!): 161/100 [HM]    Clinical Course User Index [HM] Tanny Harnack, Jarrett Soho, PA-C   Presents with elevated blood sugars despite starting on Trulicity.  Creatinine is normal, no ketones in her urine.  Anion gap is normal.  Bicarb is normal.  No evidence of DKA today.  She does have elevated blood sugar at 394.  Will give fluids, home medications for her hypertension reassess.  3:49 AM Patient is feeling some better after fluid bolus.  Labs are reassuring.  Patient wishes  to stop her new diabetic medications and return to glipizide.  I will start her on a low-dose of glipizide.  I have cautioned her about hypoglycemic episodes.  Patient will need to see her endocrinologist this week for further evaluation and discussion of her diabetes management.  Also discussed reasons to return immediately to the emergency department.  Patient and family state understanding and are in agreement with the plan.  BP (!) 161/100 (BP Location: Left Arm)   Pulse 71   Temp 98.5 F (36.9 C)   Resp 16   LMP 04/03/2018   SpO2 97%    The patient was discussed with and seen by Dr. Christy Gentles who agrees with the treatment plan.   Final Clinical Impressions(s) / ED Diagnoses   Final diagnoses:  Hyperglycemia  Hypertension, unspecified type    ED Discharge Orders         Ordered    glipiZIDE (GLUCOTROL) 5 MG tablet  Daily before breakfast     11/09/18 0344           Tayten Bergdoll, Jarrett Soho, PA-C 11/09/18 6438    Ripley Fraise, MD 11/09/18 [phone]

## 2018-11-09 NOTE — ED Notes (Signed)
Pt discharged from ED; instructions provided and scripts given; Pt encouraged to return to ED if symptoms worsen and to f/u with PCP; Pt verbalized understanding of all instructions 

## 2018-11-09 NOTE — ED Provider Notes (Signed)
Patient seen/examined in the Emergency Department in conjunction with Midlevel Provider  Patient reports hyperglycemia, dry mouth and nausea. Exam : Awake alert, no acute distress Plan: Patient has no signs of DKA.  She is well-appearing. She is frustrated with her new medications.  She would to go back to using metformin and glipizide stopping all other medicines due to cost.  She has been referred to her endocrinologist   Ripley Fraise, MD 11/09/18 [phone]

## 2018-11-09 NOTE — Discharge Instructions (Addendum)
1. Medications: CONTINUE Metformin, STOP other diabetes medications, ADD Glipizide, usual home medications 2. Treatment: rest, drink plenty of fluids, eat a low car/low sugar diet 3. Follow Up: Please followup with your endocrinologist in 3-5 days days for discussion of your diagnoses and further evaluation after today's visit; if you do not have a primary care doctor use the resource guide provided to find one; Please return to the ER for worsening symptoms, persistently elevated blood sugars or other concerns

## 2018-11-10 ENCOUNTER — Encounter: Payer: Self-pay | Admitting: Emergency Medicine

## 2018-11-10 ENCOUNTER — Other Ambulatory Visit: Payer: Self-pay

## 2018-11-10 ENCOUNTER — Emergency Department
Admission: EM | Admit: 2018-11-10 | Discharge: 2018-11-10 | Disposition: A | Discharge status: Home / Self Care | Payer: Commercial Managed Care - PPO | Attending: Emergency Medicine | Admitting: Emergency Medicine

## 2018-11-10 DIAGNOSIS — R1110 Vomiting, unspecified: Secondary | ICD-10-CM | POA: Diagnosis present

## 2018-11-10 DIAGNOSIS — E860 Dehydration: Secondary | ICD-10-CM | POA: Insufficient documentation

## 2018-11-10 DIAGNOSIS — B349 Viral infection, unspecified: Secondary | ICD-10-CM | POA: Insufficient documentation

## 2018-11-10 DIAGNOSIS — E1165 Type 2 diabetes mellitus with hyperglycemia: Secondary | ICD-10-CM | POA: Diagnosis not present

## 2018-11-10 DIAGNOSIS — Z79899 Other long term (current) drug therapy: Secondary | ICD-10-CM | POA: Insufficient documentation

## 2018-11-10 DIAGNOSIS — I10 Essential (primary) hypertension: Secondary | ICD-10-CM | POA: Insufficient documentation

## 2018-11-10 LAB — COMPREHENSIVE METABOLIC PANEL
ALT: 43 U/L (ref 0–44)
AST: 32 U/L (ref 15–41)
Albumin: 3.8 g/dL (ref 3.5–5.0)
Alkaline Phosphatase: 72 U/L (ref 38–126)
Anion gap: 8 (ref 5–15)
BUN: 13 mg/dL (ref 6–20)
CO2: 26 mmol/L (ref 22–32)
Calcium: 9.2 mg/dL (ref 8.9–10.3)
Chloride: 101 mmol/L (ref 98–111)
Creatinine, Ser: 0.73 mg/dL (ref 0.44–1.00)
GFR calc Af Amer: >60 mL/min (ref >60)
GFR calc non Af Amer: >60 mL/min (ref >60)
Glucose, Bld: 315 mg/dL — ABNORMAL HIGH (ref 70–99)
Potassium: 3.5 mmol/L (ref 3.5–5.1)
SODIUM: 135 mmol/L (ref 135–145)
Total Bilirubin: 0.8 mg/dL (ref 0.3–1.2)
Total Protein: 7.5 g/dL (ref 6.5–8.1)

## 2018-11-10 LAB — LIPASE, BLOOD: Lipase: 41 U/L (ref 11–51)

## 2018-11-10 LAB — GLUCOSE, CAPILLARY
Glucose-Capillary: 265 mg/dL — ABNORMAL HIGH (ref 70–99)
Glucose-Capillary: 204 mg/dL — ABNORMAL HIGH (ref 70–99)

## 2018-11-10 LAB — CBC
HCT: 41.4 % (ref 36.0–46.0)
Hemoglobin: 12.8 g/dL (ref 12.0–15.0)
MCH: 23.1 pg — ABNORMAL LOW (ref 26.0–34.0)
MCHC: 30.9 g/dL (ref 30.0–36.0)
MCV: 74.7 fL — ABNORMAL LOW (ref 80.0–100.0)
Platelets: 187 10*3/uL (ref 150–400)
RBC: 5.54 MIL/uL — AB (ref 3.87–5.11)
RDW: 13.2 % (ref 11.5–15.5)
WBC: 6 10*3/uL (ref 4.0–10.5)
nRBC: 0 % (ref 0.0–0.2)

## 2018-11-10 MED ORDER — SODIUM CHLORIDE 0.9 % IV BOLUS
1000.0000 mL | Freq: Once | INTRAVENOUS | Status: AC
  Administered 2018-11-10: 1000 mL via INTRAVENOUS

## 2018-11-10 MED ORDER — ONDANSETRON 4 MG PO TBDP: 4.0000 mg | ORAL_TABLET | Freq: Three times a day (TID) | ORAL | 0 refills | Status: DC | PRN

## 2018-11-10 NOTE — ED Provider Notes (Signed)
[facility] Emergency Department Provider Note  ____________________________________________  Time seen: Approximately 5:25 PM  I have reviewed the triage vital signs and the nursing notes.   HISTORY  Chief Complaint Emesis    HPI Grace Gallagher is a 47 y.o. female with a history of hypertension and diabetes who complains of vomiting and diarrhea nonproductive cough and runny nose for the past 2 to 3 days.  Blood sugars have been running high.  She went to the ED a few days ago where they gave her IV fluids for hydration, her blood sugar improved from 450 -  mid 300s and then she was discharged home.   She has been on metformin for a long time, and then was recently started on Toujeo because of elevated blood sugars.  However she felt that her blood sugar went even higher with this and so she was transitioned from Toujeo to glipizide.  She still feels like her blood sugars are running high.  Her current symptoms with vomiting and diarrhea are intermittent, worse with eating, no alleviating factors.  No radiating pain.     Past Medical History:  Diagnosis Date  . Anemia   . Diabetes mellitus without complication ([place])   . Hypertension   . Obesity      Patient Active Problem List   Diagnosis Date Noted  . AKI (acute kidney injury) ([place]) 07/25/2018  . Elevated bilirubin   . Gallstones   . Fibroid 04/05/2018  . Lower respiratory infection 07/07/2015  . Bacterial vaginosis 07/07/2015  . Edema 03/26/2015  . Anal itching 10/14/2014  . Head or neck swelling, mass, or lump 09/25/2014  . Vaginitis and vulvovaginitis 09/10/2014  . Dizziness and giddiness 06/02/2014  . Other malaise and fatigue 05/30/2014  . Acute sinusitis with symptoms > 10 days 04/09/2014  . Essential hypertension, malignant 02/04/2014  . SOB (shortness of breath) 02/04/2014  . Allergic rhinitis 01/29/2014  . Hypokalemia 01/10/2014  . Back pain 01/06/2014  . Bilateral lower  abdominal cramping 08/29/2013  . Menorrhagia 08/29/2013  . Encounter for routine gynecological examination 07/01/2013  . Headache(784.0) 03/31/2013  . HTN (hypertension) 02/21/2013  . Obesity, unspecified 02/21/2013  . Microcytic anemia 02/21/2013     Past Surgical History:  Procedure Laterality Date  . ABDOMINAL HYSTERECTOMY    . BREAST SURGERY  08/2011   breast reduction  . CYSTOSCOPY  04/12/2018   Procedure: CYSTOSCOPY;  Surgeon: Gae Dry, MD;  Location: [facility] ORS;  Service: Gynecology;;  . FOOT SURGERY Left    cyst removed  . LAPAROSCOPIC HYSTERECTOMY Bilateral 04/12/2018   Procedure: HYSTERECTOMY TOTAL LAPAROSCOPIC BILATERAL SALPINGECTOMY;  Surgeon: Gae Dry, MD;  Location: [facility] ORS;  Service: Gynecology;  Laterality: Bilateral;  . TUBAL LIGATION       Prior to Admission medications   Medication Sig Start Date End Date Taking? Authorizing Provider  amLODipine (NORVASC) 10 MG tablet Take 1 tablet (10 mg total) by mouth daily. 09/10/14   Lucille Passy, MD  atorvastatin (LIPITOR) 40 MG tablet Take 40 mg by mouth daily.     [provider]  Dulaglutide (TRULICITY) 8.52 DP/8.2UM SOPN Inject 0.75 mg into the skin every Thursday.     [provider]  ferrous sulfate 325 (65 FE) MG tablet Take 1 tablet (325 mg total) by mouth daily. Patient taking differently: Take 325 mg by mouth 2 (two) times daily.  03/10/18   Keitha Butte, CNM  furosemide (LASIX) 40 MG tablet Take 40 mg  by mouth daily as needed for fluid or edema.  05/14/18   [provider]  glipiZIDE (GLUCOTROL) 5 MG tablet Take 0.5 tablets (2.5 mg total) by mouth daily before breakfast. 11/09/18   Muthersbaugh, Jarrett Soho, PA-C  losartan-hydrochlorothiazide (HYZAAR) 100-25 MG tablet Take 1 tablet by mouth daily. 07/18/18   [provider]  metFORMIN (GLUCOPHAGE) 1000 MG tablet Take 1,000 mg by mouth 2 (two) times daily with a meal.    [provider]  metoprolol succinate  (TOPROL-XL) 50 MG 24 hr tablet Take 50 mg by mouth daily.  06/01/16   [provider]  ondansetron (ZOFRAN ODT) 4 MG disintegrating tablet Take 1 tablet (4 mg total) by mouth every 8 (eight) hours as needed for nausea or vomiting. 11/10/18   Carrie Mew, MD  potassium chloride SA (K-DUR,KLOR-CON) 20 MEQ tablet Take 2 tablets (40 mEq total) by mouth once for 1 dose. Patient not taking: Reported on 10/23/2018 06/10/18 07/18/26  Recardo Evangelist, PA-C     Allergies Hydrocodone   Family History  Problem Relation Age of Onset  . Hypertension Mother   . Heart failure Mother   . Heart attack Mother   . Cancer Father     Social History Social History   Tobacco Use  . Smoking status: Never Smoker  . Smokeless tobacco: Never Used  Substance Use Topics  . Alcohol use: No  . Drug use: No    Review of Systems  Constitutional:   No fever or chills.  ENT:   No sore throat.  Positive rhinorrhea. Cardiovascular:   No chest pain or syncope. Respiratory:   No dyspnea positive nonproductive cough. Gastrointestinal:   Negative for abdominal pain, positive vomiting and diarrhea.  Musculoskeletal:   Negative for focal pain, chronic dependent edema. All other systems reviewed and are negative except as documented above in ROS and HPI.  ____________________________________________   PHYSICAL EXAM:  VITAL SIGNS: ED Triage Vitals [11/10/18 1211]  Enc Vitals Group     BP (!) 192/129     Pulse Rate 77     Resp 18     Temp 98.2 F (36.8 C)     Temp Source Oral     SpO2 97 %     Weight 261 lb 3.9 oz (118.5 kg)     Height      Head Circumference      Peak Flow      Pain Score 0     Pain Loc      Pain Edu?      Excl. in [place]?     Vital signs reviewed, nursing assessments reviewed.   Constitutional:   Alert and oriented. Non-toxic appearance. Eyes:   Conjunctivae are normal. EOMI. PERRL. ENT      Head:   Normocephalic and atraumatic.      Nose:   No  congestion/rhinnorhea.       Mouth/Throat:   MMM, no pharyngeal erythema. No peritonsillar mass.       Neck:   No meningismus. Full ROM. Hematological/Lymphatic/Immunilogical:   No cervical lymphadenopathy. Cardiovascular:   RRR. Symmetric bilateral radial and DP pulses.  No murmurs. Cap refill less than 2 seconds. Respiratory:   Normal respiratory effort without tachypnea/retractions. Breath sounds are clear and equal bilaterally. No wheezes/rales/rhonchi. Gastrointestinal:   Soft and nontender. Non distended. There is no CVA tenderness.  No rebound, rigidity, or guarding. Musculoskeletal:   Normal range of motion in all extremities. No joint effusions.  No lower extremity tenderness.  Trace peripheral edema. Neurologic:   Normal speech and language.  Motor grossly intact. No acute focal neurologic deficits are appreciated.  Skin:    Skin is warm, dry and intact. No rash noted.  No petechiae, purpura, or bullae.  ____________________________________________    LABS (pertinent positives/negatives) (all labs ordered are listed, but only abnormal results are displayed) Labs Reviewed  GLUCOSE, CAPILLARY - Abnormal; Notable for the following components:      Result Value   Glucose-Capillary 265 (*)    All other components within normal limits  COMPREHENSIVE METABOLIC PANEL - Abnormal; Notable for the following components:   Glucose, Bld 315 (*)    All other components within normal limits  CBC - Abnormal; Notable for the following components:   RBC 5.54 (*)    MCV 74.7 (*)    MCH 23.1 (*)    All other components within normal limits  GLUCOSE, CAPILLARY - Abnormal; Notable for the following components:   Glucose-Capillary 204 (*)    All other components within normal limits  LIPASE, BLOOD  URINALYSIS, COMPLETE (UACMP) WITH MICROSCOPIC  CBG MONITORING, ED   ____________________________________________   EKG    ____________________________________________    RADIOLOGY  No  results found.  ____________________________________________   PROCEDURES Procedures  ____________________________________________    CLINICAL IMPRESSION / ASSESSMENT AND PLAN / ED COURSE  Pertinent labs & imaging results that were available during my care of the patient were reviewed by me and considered in my medical decision making (see chart for details).      Clinical Course as of Nov 10 1724  Sat Nov 10, 2018  1630 Patient presents with vomiting and diarrhea for the past several days.  She notes that her grandchildren have recently been sick with viral syndromes as well.  Her exam is benign and reassuring.  Labs are all normal except for mild hyperglycemia to 315.  She is recently been adjusting her diabetes regimen, consistently on metformin but recently transitioned off of Toujeo 20 units daily to glipizide.  Give IV fluids for hydration, recheck sugar.  Bolus dose of NovoLog as needed for glycemic control, otherwise will plan close outpatient follow-up for continued diabetes regimen adjustment.   [PS]    Clinical Course User Index [PS] Carrie Mew, MD     ----------------------------------------- 5:27 PM on 11/10/2018 -----------------------------------------  Blood sugar now 200 after liter of fluids.  Recommend she continue her current regimen, follow-up with primary care in 2 days on Monday.  Counseled her that I believe her blood sugar being more out of control recently is due to her acute illness with a viral syndrome and will be more regulated once the inflammatory state improves.  No evidence of acidosis or DKA.  She is well-appearing and suitable for outpatient management.  Will prescribe Zofran for continued symptomatic relief at home.  ____________________________________________   FINAL CLINICAL IMPRESSION(S) / ED DIAGNOSES    Final diagnoses:  Non-intractable vomiting, presence of nausea not specified, unspecified vomiting type  Dehydration   Viral syndrome  Type 2 diabetes mellitus with hyperglycemia, without long-term current use of insulin [facility])     ED Discharge Orders         Ordered    ondansetron (ZOFRAN ODT) 4 MG disintegrating tablet  Every 8 hours PRN     11/10/18 1721          Portions of this note were generated with dragon dictation software. Dictation errors may occur despite best attempts at proofreading.    Joni Fears,  Doren Custard, MD 11/10/18 1728

## 2018-11-10 NOTE — ED Notes (Signed)
Pt ambulatory to treatment room with steady gait noted. No distress noted at this time.

## 2018-11-10 NOTE — ED Notes (Signed)
Pt provided Diet Ginger Ale for PO challenge

## 2018-11-10 NOTE — ED Triage Notes (Signed)
Pt to ed with c/o hyperglycemia x several weeks.  Pt states she has been changing her meds with her MD but no relief of symptoms.  Pt reports increased thirst, increased urination and vomiting.

## 2018-11-13 ENCOUNTER — Emergency Department
Admission: EM | Admit: 2018-11-13 | Discharge: 2018-11-14 | Disposition: A | Discharge status: Home / Self Care | Payer: Commercial Managed Care - PPO | Attending: Emergency Medicine | Admitting: Emergency Medicine

## 2018-11-13 ENCOUNTER — Other Ambulatory Visit: Payer: Self-pay

## 2018-11-13 DIAGNOSIS — E1165 Type 2 diabetes mellitus with hyperglycemia: Secondary | ICD-10-CM | POA: Insufficient documentation

## 2018-11-13 DIAGNOSIS — Z7984 Long term (current) use of oral hypoglycemic drugs: Secondary | ICD-10-CM | POA: Insufficient documentation

## 2018-11-13 DIAGNOSIS — I10 Essential (primary) hypertension: Secondary | ICD-10-CM | POA: Diagnosis not present

## 2018-11-13 DIAGNOSIS — R110 Nausea: Secondary | ICD-10-CM | POA: Diagnosis not present

## 2018-11-13 DIAGNOSIS — R739 Hyperglycemia, unspecified: Secondary | ICD-10-CM

## 2018-11-13 DIAGNOSIS — Z79899 Other long term (current) drug therapy: Secondary | ICD-10-CM | POA: Diagnosis not present

## 2018-11-13 LAB — COMPREHENSIVE METABOLIC PANEL
ALT: 34 U/L (ref 0–44)
AST: 21 U/L (ref 15–41)
Albumin: 3.7 g/dL (ref 3.5–5.0)
Alkaline Phosphatase: 74 U/L (ref 38–126)
Anion gap: 8 (ref 5–15)
BUN: 17 mg/dL (ref 6–20)
CO2: 24 mmol/L (ref 22–32)
Calcium: 8.7 mg/dL — ABNORMAL LOW (ref 8.9–10.3)
Chloride: 100 mmol/L (ref 98–111)
Creatinine, Ser: 0.81 mg/dL (ref 0.44–1.00)
GFR calc Af Amer: >60 mL/min (ref >60)
GFR calc non Af Amer: >60 mL/min (ref >60)
Glucose, Bld: 599 mg/dL (ref 70–99)
POTASSIUM: 4 mmol/L (ref 3.5–5.1)
Sodium: 132 mmol/L — ABNORMAL LOW (ref 135–145)
Total Bilirubin: 0.7 mg/dL (ref 0.3–1.2)
Total Protein: 7.2 g/dL (ref 6.5–8.1)

## 2018-11-13 LAB — CBC
HCT: 39.1 % (ref 36.0–46.0)
Hemoglobin: 12.2 g/dL (ref 12.0–15.0)
MCH: 23 pg — ABNORMAL LOW (ref 26.0–34.0)
MCHC: 31.2 g/dL (ref 30.0–36.0)
MCV: 73.8 fL — ABNORMAL LOW (ref 80.0–100.0)
Platelets: 155 10*3/uL (ref 150–400)
RBC: 5.3 MIL/uL — ABNORMAL HIGH (ref 3.87–5.11)
RDW: 13.4 % (ref 11.5–15.5)
WBC: 6 10*3/uL (ref 4.0–10.5)
nRBC: 0 % (ref 0.0–0.2)

## 2018-11-13 LAB — URINALYSIS, COMPLETE (UACMP) WITH MICROSCOPIC
Bacteria, UA: NONE SEEN
Bilirubin Urine: NEGATIVE
Glucose, UA: >=500 mg/dL — AB
Hgb urine dipstick: NEGATIVE
Ketones, ur: NEGATIVE mg/dL
Leukocytes, UA: NEGATIVE
Nitrite: NEGATIVE
Protein, ur: NEGATIVE mg/dL
Specific Gravity, Urine: 1.017 (ref 1.005–1.030)
pH: 7 (ref 5.0–8.0)

## 2018-11-13 LAB — GLUCOSE, CAPILLARY: Glucose-Capillary: 575 mg/dL (ref 70–99)

## 2018-11-13 NOTE — ED Triage Notes (Signed)
Pin with co headache and urinary frequency, pt is a diabetic but fsbs has been out of control for over a month. Has recently been taken off of insulin and glipizide added but states not able to get glucose under control.

## 2018-11-14 LAB — GLUCOSE, CAPILLARY
Glucose-Capillary: 307 mg/dL — ABNORMAL HIGH (ref 70–99)
Glucose-Capillary: 261 mg/dL — ABNORMAL HIGH (ref 70–99)
Glucose-Capillary: 302 mg/dL — ABNORMAL HIGH (ref 70–99)

## 2018-11-14 MED ORDER — INSULIN ASPART 100 UNIT/ML ~~LOC~~ SOLN
10.0000 [IU] | Freq: Once | SUBCUTANEOUS | Status: AC
  Administered 2018-11-14: 10 [IU] via INTRAVENOUS
  Filled 2018-11-14: qty 1

## 2018-11-14 MED ORDER — SODIUM CHLORIDE 0.9 % IV BOLUS
2000.0000 mL | Freq: Once | INTRAVENOUS | Status: AC
  Administered 2018-11-14: 2000 mL via INTRAVENOUS

## 2018-11-14 NOTE — Discharge Instructions (Addendum)
As we discussed, though both your blood sugar and your blood pressure are running high, they are not dangerous at this time.  Making adjustments in the Emergency Department (ED) and possibly causing your glucose level to drop too low or rapidly dropping your blood pressure can be more dangerous than continuing your current medications at this time until you can follow up with your clinic doctor.  Please continue your medications and follow up with your regular doctor as recommended in these documents.  If you develop new or worsening symptoms that concern you, please return to the Emergency Department.

## 2018-11-14 NOTE — ED Provider Notes (Signed)
[facility] Emergency Department Provider Note  ____________________________________________   First MD Initiated Contact with Patient 11/14/18 0024     (approximate)  I have reviewed the triage vital signs and the nursing notes.   HISTORY  Chief Complaint Hyperglycemia    HPI Grace Gallagher is a 47 y.o. female with medical history as listed below who presents for evaluation of hyperglycemia.  She reports that she has been having a lot of trouble controlling her blood sugar over an extended period of time.  This is her third visit to an emergency department over the last week or so and she has had 9 visits in the last 6 months.  She says that her doctor is working with her trying to find a regimen of medications that helps but nothing seems to be helping and she describes the hyperglycemia is severe.  She states that she has had some persistent nausea for an extended period of time but no recent vomiting.  She denies fever/chills, chest pain, shortness of breath, abdominal pain, and dysuria.  She gets a headache when her blood sugar is elevated.  She reports that the medicine she has been taking seems to be making her blood sugar go up instead of down.  She denies drug and alcohol use.  She is also on 4 different antihypertensives and states that her blood pressure stays high as well.  Past Medical History:  Diagnosis Date  . Anemia   . Diabetes mellitus without complication ([place])   . Hypertension   . Obesity     Patient Active Problem List   Diagnosis Date Noted  . AKI (acute kidney injury) ([place]) 07/25/2018  . Elevated bilirubin   . Gallstones   . Fibroid 04/05/2018  . Lower respiratory infection 07/07/2015  . Bacterial vaginosis 07/07/2015  . Edema 03/26/2015  . Anal itching 10/14/2014  . Head or neck swelling, mass, or lump 09/25/2014  . Vaginitis and vulvovaginitis 09/10/2014  . Dizziness and giddiness 06/02/2014  . Other malaise and fatigue  05/30/2014  . Acute sinusitis with symptoms > 10 days 04/09/2014  . Essential hypertension, malignant 02/04/2014  . SOB (shortness of breath) 02/04/2014  . Allergic rhinitis 01/29/2014  . Hypokalemia 01/10/2014  . Back pain 01/06/2014  . Bilateral lower abdominal cramping 08/29/2013  . Menorrhagia 08/29/2013  . Encounter for routine gynecological examination 07/01/2013  . Headache(784.0) 03/31/2013  . HTN (hypertension) 02/21/2013  . Obesity, unspecified 02/21/2013  . Microcytic anemia 02/21/2013    Past Surgical History:  Procedure Laterality Date  . ABDOMINAL HYSTERECTOMY    . BREAST SURGERY  08/2011   breast reduction  . CYSTOSCOPY  04/12/2018   Procedure: CYSTOSCOPY;  Surgeon: Gae Dry, MD;  Location: [facility] ORS;  Service: Gynecology;;  . FOOT SURGERY Left    cyst removed  . LAPAROSCOPIC HYSTERECTOMY Bilateral 04/12/2018   Procedure: HYSTERECTOMY TOTAL LAPAROSCOPIC BILATERAL SALPINGECTOMY;  Surgeon: Gae Dry, MD;  Location: [facility] ORS;  Service: Gynecology;  Laterality: Bilateral;  . TUBAL LIGATION      Prior to Admission medications   Medication Sig Start Date End Date Taking? Authorizing Provider  amLODipine (NORVASC) 10 MG tablet Take 1 tablet (10 mg total) by mouth daily. 09/10/14   Lucille Passy, MD  atorvastatin (LIPITOR) 40 MG tablet Take 40 mg by mouth daily.     [provider]  Dulaglutide (TRULICITY) 8.34 HD/6.2IW SOPN Inject 0.75 mg into the skin every Thursday.     [provider]  ferrous sulfate 325 (65 FE) MG tablet Take 1 tablet (325 mg total) by mouth daily. Patient taking differently: Take 325 mg by mouth 2 (two) times daily.  03/10/18   Keitha Butte, CNM  furosemide (LASIX) 40 MG tablet Take 40 mg by mouth daily as needed for fluid or edema.  05/14/18   [provider]  glipiZIDE (GLUCOTROL) 5 MG tablet Take 0.5 tablets (2.5 mg total) by mouth daily before breakfast. 11/09/18   Muthersbaugh, Jarrett Soho, PA-C    losartan-hydrochlorothiazide (HYZAAR) 100-25 MG tablet Take 1 tablet by mouth daily. 07/18/18   [provider]  metFORMIN (GLUCOPHAGE) 1000 MG tablet Take 1,000 mg by mouth 2 (two) times daily with a meal.    [provider]  metoprolol succinate (TOPROL-XL) 50 MG 24 hr tablet Take 50 mg by mouth daily.  06/01/16   [provider]  ondansetron (ZOFRAN ODT) 4 MG disintegrating tablet Take 1 tablet (4 mg total) by mouth every 8 (eight) hours as needed for nausea or vomiting. 11/10/18   Carrie Mew, MD  potassium chloride SA (K-DUR,KLOR-CON) 20 MEQ tablet Take 2 tablets (40 mEq total) by mouth once for 1 dose. Patient not taking: Reported on 10/23/2018 06/10/18 07/18/26  Recardo Evangelist, PA-C    Allergies Hydrocodone  Family History  Problem Relation Age of Onset  . Hypertension Mother   . Heart failure Mother   . Heart attack Mother   . Cancer Father     Social History Social History   Tobacco Use  . Smoking status: Never Smoker  . Smokeless tobacco: Never Used  Substance Use Topics  . Alcohol use: No  . Drug use: No    Review of Systems Constitutional: No fever/chills.  Hyperglycemia. Eyes: No visual changes. ENT: No sore throat. Cardiovascular: Denies chest pain. Respiratory: Denies shortness of breath. Gastrointestinal: No abdominal pain.  Nausea, no vomiting.  No diarrhea.  No constipation. Genitourinary: Negative for dysuria. Musculoskeletal: Negative for neck pain.  Negative for back pain. Integumentary: Negative for rash. Neurological: Generalized headache that was gradual in onset and without evidence of trauma. No focal weakness or numbness.   ____________________________________________   PHYSICAL EXAM:  VITAL SIGNS: ED Triage Vitals  Enc Vitals Group     BP 11/13/18 2115 (!) 185/100     Pulse Rate 11/13/18 2115 81     Resp 11/13/18 2115 20     Temp 11/13/18 2115 98.1 F (36.7 C)     Temp Source 11/13/18 2115 Oral      SpO2 11/13/18 2115 100 %     Weight 11/13/18 2116 122 kg (269 lb)     Height 11/13/18 2116 1.651 m (5\' 5" )     Head Circumference --      Peak Flow --      Pain Score 11/13/18 2116 0     Pain Loc --      Pain Edu? --      Excl. in [place]? --     Constitutional: Alert and oriented. Well appearing and in no acute distress. Eyes: Conjunctivae are normal.  Head: Atraumatic. Nose: No congestion/rhinnorhea. Mouth/Throat: Mucous membranes are moist. Neck: No stridor.  No meningeal signs.   Cardiovascular: Normal rate, regular rhythm. Good peripheral circulation. Grossly normal heart sounds. Respiratory: Normal respiratory effort.  No retractions. Lungs CTAB. Gastrointestinal: Soft and nontender. No distention.  Musculoskeletal: No lower extremity tenderness nor edema. No gross deformities of extremities. Neurologic:  Normal speech and language. No gross focal neurologic deficits  are appreciated.  Skin:  Skin is warm, dry and intact. No rash noted. Psychiatric: Mood and affect are normal. Speech and behavior are normal.  ____________________________________________   LABS (all labs ordered are listed, but only abnormal results are displayed)  Labs Reviewed  GLUCOSE, CAPILLARY - Abnormal; Notable for the following components:      Result Value   Glucose-Capillary 575 (*)    All other components within normal limits  URINALYSIS, COMPLETE (UACMP) WITH MICROSCOPIC - Abnormal; Notable for the following components:   Color, Urine COLORLESS (*)    APPearance CLEAR (*)    Glucose, UA >=500 (*)    All other components within normal limits  CBC - Abnormal; Notable for the following components:   RBC 5.30 (*)    MCV 73.8 (*)    MCH 23.0 (*)    All other components within normal limits  COMPREHENSIVE METABOLIC PANEL - Abnormal; Notable for the following components:   Sodium 132 (*)    Glucose, Bld 599 (*)    Calcium 8.7 (*)    All other components within normal limits  GLUCOSE, CAPILLARY -  Abnormal; Notable for the following components:   Glucose-Capillary 307 (*)    All other components within normal limits  GLUCOSE, CAPILLARY - Abnormal; Notable for the following components:   Glucose-Capillary 302 (*)    All other components within normal limits  GLUCOSE, CAPILLARY - Abnormal; Notable for the following components:   Glucose-Capillary 261 (*)    All other components within normal limits  CBG MONITORING, ED   ____________________________________________  EKG  No indication for EKG ____________________________________________  RADIOLOGY   ED MD interpretation: No indication for imaging  Official radiology report(s): No results found.  ____________________________________________   PROCEDURES  Critical Care performed: No   Procedure(s) performed:   Procedures   ____________________________________________   INITIAL IMPRESSION / ASSESSMENT AND PLAN / ED COURSE  As part of my medical decision making, I reviewed the following data within the [place] notes reviewed and incorporated, Labs reviewed , Old chart reviewed and Notes from prior ED visits    Differential diagnosis includes, but is not limited to, uncontrolled diabetes, uncontrolled hypertension, DKA, HH and C.  Fortunately the patient is well-appearing and in no acute distress.  Her vital signs are notable for significantly elevated blood pressure but she says this is normal and she is on 4 different antihypertensives and working with her doctor to try to control it.  She does not appear to have any symptoms from it at this time.  Lab work is notable for a glucose of 599 but an otherwise normal comprehensive metabolic panel including a normal anion gap.  Her CBC is normal and there is no evidence of urinary tract infection.  She is comfortable with getting 2 L of normal saline and regular insulin 10 units IV and then checking a repeat blood sugar.  There is no indication  for changing her medications at this time since she has a primary care provider with whom she has been following up.  She agrees with the plan and I will reassess after her treatment.  Clinical Course as of Nov 14 336  Wed Nov 14, 2018  0333 The patient has been sleeping comfortably.  Her blood glucose level is down to 261.  I will discharge as per the previous discussion for her to follow-up as an outpatient.  Although she is hypertensive she is asymptomatic from it and will discuss it  with her doctor.  I gave my usual and customary return precautions.   [CF]    Clinical Course User Index [CF] Hinda Kehr, MD    ____________________________________________  FINAL CLINICAL IMPRESSION(S) / ED DIAGNOSES  Final diagnoses:  Hyperglycemia  Essential hypertension     MEDICATIONS GIVEN DURING THIS VISIT:  Medications  sodium chloride 0.9 % bolus 2,000 mL (0 mLs Intravenous Stopped 11/14/18 0321)  insulin aspart (novoLOG) injection 10 Units (10 Units Intravenous Given 11/14/18 0034)     ED Discharge Orders    None       Note:  This document was prepared using Dragon voice recognition software and may include unintentional dictation errors.    Hinda Kehr, MD 11/14/18 [phone]

## 2018-12-12 ENCOUNTER — Encounter: Payer: Self-pay | Admitting: Emergency Medicine

## 2018-12-12 ENCOUNTER — Emergency Department
Admission: EM | Admit: 2018-12-12 | Discharge: 2018-12-12 | Disposition: A | Discharge status: Home / Self Care | Payer: Commercial Managed Care - PPO | Attending: Emergency Medicine | Admitting: Emergency Medicine

## 2018-12-12 ENCOUNTER — Other Ambulatory Visit: Payer: Self-pay

## 2018-12-12 ENCOUNTER — Encounter: Payer: Self-pay | Admitting: Physician Assistant

## 2018-12-12 ENCOUNTER — Ambulatory Visit (INDEPENDENT_AMBULATORY_CARE_PROVIDER_SITE_OTHER): Payer: Commercial Managed Care - PPO | Admitting: Physician Assistant

## 2018-12-12 VITALS — BP 180/124 | HR 71 | Temp 98.7°F | Resp 16 | Ht 66.0 in | Wt 260.6 lb

## 2018-12-12 DIAGNOSIS — E1159 Type 2 diabetes mellitus with other circulatory complications: Secondary | ICD-10-CM | POA: Diagnosis not present

## 2018-12-12 DIAGNOSIS — I160 Hypertensive urgency: Secondary | ICD-10-CM

## 2018-12-12 DIAGNOSIS — Z5321 Procedure and treatment not carried out due to patient leaving prior to being seen by health care provider: Secondary | ICD-10-CM | POA: Insufficient documentation

## 2018-12-12 DIAGNOSIS — E1165 Type 2 diabetes mellitus with hyperglycemia: Secondary | ICD-10-CM

## 2018-12-12 DIAGNOSIS — Z6841 Body Mass Index (BMI) 40.0 and over, adult: Secondary | ICD-10-CM

## 2018-12-12 DIAGNOSIS — I10 Essential (primary) hypertension: Secondary | ICD-10-CM | POA: Diagnosis present

## 2018-12-12 DIAGNOSIS — IMO0001 Reserved for inherently not codable concepts without codable children: Secondary | ICD-10-CM

## 2018-12-12 DIAGNOSIS — I152 Hypertension secondary to endocrine disorders: Secondary | ICD-10-CM

## 2018-12-12 DIAGNOSIS — E1169 Type 2 diabetes mellitus with other specified complication: Secondary | ICD-10-CM

## 2018-12-12 DIAGNOSIS — R718 Other abnormality of red blood cells: Secondary | ICD-10-CM

## 2018-12-12 DIAGNOSIS — E785 Hyperlipidemia, unspecified: Secondary | ICD-10-CM

## 2018-12-12 LAB — HEMOGLOBIN A1C: Hemoglobin A1C: 10.4

## 2018-12-12 MED ORDER — METOPROLOL SUCCINATE ER 25 MG PO TB24: ORAL_TABLET | ORAL | 0 refills | Status: DC

## 2018-12-12 NOTE — ED Notes (Signed)
Called third time from lobby without answer.

## 2018-12-12 NOTE — Progress Notes (Signed)
Patient: Grace Gallagher, Female    DOB: 01-07-1971, 48 y.o.   MRN: [id number] Visit Date: 12/13/2018  Today's Provider: Trinna Post, PA-C   Chief Complaint  Patient presents with  . New Patient (Initial Visit)   Subjective:    New Patient Grace Gallagher is a 48 y.o. female who presents today for health maintenance and establish patient care. She feels fairly well. She reports not actively exercising. She reports she is sleeping fairly well. She lives in [place] with her husband of four years. She has two children, a boy and a girl, ages 73-24. She is a Psychologist, counselling at [employer]. She has had flu shot this season. She had a hysterectomy on 04/12/2018 for uterine fibroids in which both her uterus and cervix were removed.  DM II: She has a history of severely uncontrolled diabetes. She sees [facility] in [place]. She had a visit with Dr. Francetta Found today. According to notes per Epic review, her current regimen is Relion 70/30 Flexpen 40-48 units twice daily before meals, trulicity 1.5 mg weekly, metfromin ER 1000 mg BID and check blood sugars three times daily. Previously she was on TOujeo though this was discontinued due to cost and changed to [place]. She has been referred to sleep medicine to address untreated sleep apnea as she has not been using CPAP due to discomfort. Her A1c is 10.4%. She is due for eye exam as she did not get one last year. She is very confused about why her blood sugars are so high as she had been on metformin as a single agent for years and then all of a sudden her sugars became uncontrolled. She is on a statin and an ARB.  HTN: She has a longstanding history of uncontrolled hypertension. She is currently on four anti-HTN agents including metoprolol succinate 50 mg daily, amlodipine 10 mg daily, and losartan 100-25 mg daily. Her blood pressure today is 180/124 and she has a moderate-severe headache that is not the worst headache of  her life. She reports she takes all of her medications every day. She does have sleep apnea and is not compliant with CPAP. Denies use of nasal decongestants, oral decongestants, excessive use of NSAIDs. She reports she has never had a workup for secondary HTN.   HLD: Last lipid panel through care everywhere 09/10/2018 shows LDL 54 and total cholesterol 112. She is currently taking atorvastatin 40 mg.   Hysterectomy: She had a hysterectomy 04/2018 for uterine fibroids. Pathology was benign. Most recent CBC on 11/13/2018 showed MCV 73. 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   -----------------------------------------------------------------   Review of Systems  Constitutional: Positive for unexpected weight change.  HENT: Positive for dental problem, ear pain, nosebleeds and sinus pressure.   Eyes: Positive for itching.  Respiratory: Positive for apnea and wheezing.   Cardiovascular: Positive for leg swelling.  Gastrointestinal: Positive for abdominal distention, constipation and diarrhea.  Endocrine: Positive for cold intolerance, heat intolerance, polydipsia and polyuria.  Genitourinary: Negative.   Musculoskeletal: Positive for neck pain.  Skin: Negative.   Allergic/Immunologic: Negative.   Neurological: Positive for dizziness, light-headedness, numbness and headaches.    Social History She  reports that she has never smoked. She has never used smokeless tobacco. She reports that she does not drink alcohol or use drugs. Social History   Socioeconomic History  . Marital status: Married    Spouse name: Not on file  . Number of  children: Not on file  . Years of education: Not on file  . Highest education level: Not on file  Occupational History  . Occupation: Optician, dispensing: [employer]  Social Needs  . Financial resource strain: Not on file  . Food insecurity:    Worry: Not on file    Inability: Not on file  . Transportation needs:    Medical: Not on file     Non-medical: Not on file  Tobacco Use  . Smoking status: Never Smoker  . Smokeless tobacco: Never Used  Substance and Sexual Activity  . Alcohol use: No  . Drug use: No  . Sexual activity: Yes    Birth control/protection: Surgical  Lifestyle  . Physical activity:    Days per week: Not on file    Minutes per session: Not on file  . Stress: Not on file  Relationships  . Social connections:    Talks on phone: Not on file    Gets together: Not on file    Attends religious service: Not on file    Active member of club or organization: Not on file    Attends meetings of clubs or organizations: Not on file    Relationship status: Not on file  Other Topics Concern  . Not on file  Social History Narrative   Two children- 36 and 19 yo live with her.   CNA at [employer].    Patient Active Problem List   Diagnosis Date Noted  . Hyperlipidemia associated with type 2 diabetes mellitus ([place]) 10/25/2018  . OSA (obstructive sleep apnea) 10/24/2018  . AKI (acute kidney injury) ([place]) 07/25/2018  . Elevated bilirubin   . Gallstones   . Iron deficiency anemia 05/08/2018  . H/O abdominal hysterectomy 05/08/2018  . Fibroid 04/05/2018  . Family history of early CAD 04/12/2017  . Uncontrolled type 2 diabetes mellitus without complication, without long-term current use of insulin ([place]) 04/12/2017  . Lower respiratory infection 07/07/2015  . Bacterial vaginosis 07/07/2015  . Edema 03/26/2015  . Anal itching 10/14/2014  . Head or neck swelling, mass, or lump 09/25/2014  . Vaginitis and vulvovaginitis 09/10/2014  . Dizziness and giddiness 06/02/2014  . Other malaise and fatigue 05/30/2014  . Acute sinusitis with symptoms > 10 days 04/09/2014  . Essential hypertension, malignant 02/04/2014  . SOB (shortness of breath) 02/04/2014  . Allergic rhinitis 01/29/2014  . Hypokalemia 01/10/2014  . Back pain 01/06/2014  . Bilateral lower abdominal cramping 08/29/2013  . Menorrhagia 08/29/2013  .  Encounter for routine gynecological examination 07/01/2013  . Headache(784.0) 03/31/2013  . Hypertension associated with type 2 diabetes mellitus ([place]) 02/21/2013  . Morbid obesity with BMI of 40.0-44.9, adult ([place]) 02/21/2013  . Microcytic anemia 02/21/2013    Past Surgical History:  Procedure Laterality Date  . ABDOMINAL HYSTERECTOMY    . BREAST SURGERY  08/2011   breast reduction  . CYSTOSCOPY  04/12/2018   Procedure: CYSTOSCOPY;  Surgeon: Gae Dry, MD;  Location: [facility] ORS;  Service: Gynecology;;  . FOOT SURGERY Left    cyst removed  . GALLBLADDER SURGERY  09/03/2018  . LAPAROSCOPIC HYSTERECTOMY Bilateral 04/12/2018   Procedure: HYSTERECTOMY TOTAL LAPAROSCOPIC BILATERAL SALPINGECTOMY;  Surgeon: Gae Dry, MD;  Location: [facility] ORS;  Service: Gynecology;  Laterality: Bilateral;  . TUBAL LIGATION      Family History  Family Status  Relation Name Status  . Mother  Deceased at age 83       lung cancer  .  Father  Deceased at age 93 yo       colon CA  . Brother  Alive   Her family history includes Cancer in her father; Diabetes in her brother; Heart attack in her mother; Heart failure in her mother; Hypertension in her brother and mother.     Allergies  Allergen Reactions  . Hydrocodone Nausea Only    Previous Medications   AMLODIPINE (NORVASC) 10 MG TABLET    Take 1 tablet (10 mg total) by mouth daily.   ATORVASTATIN (LIPITOR) 40 MG TABLET    Take 40 mg by mouth daily.    DULAGLUTIDE 1.5 MG/0.5ML SOPN    Inject into the skin.   FUROSEMIDE (LASIX) 40 MG TABLET    Take 40 mg by mouth daily as needed for fluid or edema.    INSULIN ISOPHANE & REGULAR HUMAN (NOVOLIN 70/30 FLEXPEN RELION) (70-30) 100 UNIT/ML PEN    Inject into the skin.   LOSARTAN-HYDROCHLOROTHIAZIDE (HYZAAR) 100-25 MG TABLET    Take 1 tablet by mouth daily.   METFORMIN (GLUCOPHAGE) 1000 MG TABLET    Take 1,000 mg by mouth 2 (two) times daily with a meal.   METOPROLOL SUCCINATE (TOPROL-XL) 50 MG 24 HR  TABLET    Take 50 mg by mouth daily.    ONDANSETRON (ZOFRAN ODT) 4 MG DISINTEGRATING TABLET    Take 1 tablet (4 mg total) by mouth every 8 (eight) hours as needed for nausea or vomiting.    Patient Care Team: Paulene Floor as PCP - General (Physician Assistant)      Objective:   Vitals: BP (!) 180/124   Pulse 71   Temp 98.7 F (37.1 C) (Oral)   Resp 16   Ht 5\' 6"  (1.676 m)   Wt 260 lb 9.6 oz (118.2 kg)   LMP 04/03/2018   BMI 42.06 kg/m    Physical Exam   Depression Screen PHQ 2/9 Scores 07/07/2015 06/24/2015  PHQ - 2 Score 2 0  PHQ- 9 Score 6 -      Assessment & Plan:     Routine Health Maintenance and Physical Exam  Exercise Activities and Dietary recommendations Goals   None     Immunization History  Administered Date(s) Administered  . Tdap 05/14/2013    Health Maintenance  Topic Date Due  . FOOT EXAM  10/10/1981  . OPHTHALMOLOGY EXAM  10/10/1981  . INFLUENZA VACCINE  07/05/2018  . PAP SMEAR-Modifier  05/11/2019  . HEMOGLOBIN A1C  06/12/2019  . TETANUS/TDAP  05/15/2023  . PNEUMOCOCCAL POLYSACCHARIDE VACCINE AGE 77-64 HIGH RISK  Completed  . HIV Screening  Completed     Discussed health benefits of physical activity, and encouraged her to engage in regular exercise appropriate for her age and condition.   1. Hypertension associated with type 2 diabetes mellitus ([place])  Her pulse is 70 and so we can increase metoprolol as she is already maximized on 3 other agents. However, her blood pressure is quite high today and she has a moderate headache that she appears visibly discomforted by. I have advised her to go to the ER for hypertensive urgency and I have called ahead. At the time of writing this note, I can see that she left the emergency room without being seen.   She reports she has not had a workup for secondary hypertension, however she has multiple encounters with cardiologist Dr. Fletcher Anon in 2015 with negative renin/aldosterone ratio and  negative renal artery duplex. She also had a normal  echocardiogram with normal LV systolic function with no evidence of pulmonary HTN.  Regardless, I think she needs to revisit cardiology seeing as her blood pressure remains elevated despite four anti-hypertensive medications. She is seeing sleep medicine to remedy issues with CPAP.  In the mean time, I will send in additional 25 mg metoprolol succinate for her to combine with 50 mg for total of 75 mg daily. Will see her back in one month as it appears cardiology visit has been scheduled 02/14/2019 with Dr. Saunders Revel.   - Ambulatory referral to Cardiology - metoprolol succinate (TOPROL-XL) 25 MG 24 hr tablet; Take 25 mg daily along with 50 mg daily for total of 75 mg daily.  Dispense: 90 tablet; Refill: 0  2. Hypertensive urgency  Have advised to the ER.   3. Uncontrolled type 2 diabetes mellitus without complication, without long-term current use of insulin ([place])  Have encouraged patient to follow up with endocrinology. At this point, I do not have any additional treatment recommendations for her diabetes and believe this needs to be managed by endocrinology.  4. Hyperlipidemia associated with type 2 diabetes mellitus ([facility])  Controlled, on atorvastatin 40 mg daily.   5. Morbid obesity with BMI of 40.0-44.9, adult ([place])  Counseled on lifestyle.   6. Microcytosis  Will repeat cbc and iron panel at next visit, do not want to delay treatment at ER. Per EMR review she has had low MCV for many years, at least since 2010 when her MCV was 75.   Return in about 1 month (around 01/12/2019) for HTN.  The entirety of the information documented in the History of Present Illness, Review of Systems and Physical Exam were personally obtained by me. Portions of this information were initially documented by Lynford Humphrey, CMA and reviewed by me for thoroughness and accuracy.    --------------------------------------------------------------------

## 2018-12-12 NOTE — Patient Instructions (Signed)
Please go to the emergency room for symptoms of headache with your hypertension.    Hypertension Hypertension, commonly called high blood pressure, is when the force of blood pumping through the arteries is too strong. The arteries are the blood vessels that carry blood from the heart throughout the body. Hypertension forces the heart to work harder to pump blood and may cause arteries to become narrow or stiff. Having untreated or uncontrolled hypertension can cause heart attacks, strokes, kidney disease, and other problems. A blood pressure reading consists of a higher number over a lower number. Ideally, your blood pressure should be below 120/80. The first ("top") number is called the systolic pressure. It is a measure of the pressure in your arteries as your heart beats. The second ("bottom") number is called the diastolic pressure. It is a measure of the pressure in your arteries as the heart relaxes. What are the causes? The cause of this condition is not known. What increases the risk? Some risk factors for high blood pressure are under your control. Others are not. Factors you can change  Smoking.  Having type 2 diabetes mellitus, high cholesterol, or both.  Not getting enough exercise or physical activity.  Being overweight.  Having too much fat, sugar, calories, or salt (sodium) in your diet.  Drinking too much alcohol. Factors that are difficult or impossible to change  Having chronic kidney disease.  Having a family history of high blood pressure.  Age. Risk increases with age.  Race. You may be at higher risk if you are African-American.  Gender. Men are at higher risk than women before age 16. After age 9, women are at higher risk than men.  Having obstructive sleep apnea.  Stress. What are the signs or symptoms? Extremely high blood pressure (hypertensive crisis) may cause:  Headache.  Anxiety.  Shortness of breath.  Nosebleed.  Nausea and  vomiting.  Severe chest pain.  Jerky movements you cannot control (seizures). How is this diagnosed? This condition is diagnosed by measuring your blood pressure while you are seated, with your arm resting on a surface. The cuff of the blood pressure monitor will be placed directly against the skin of your upper arm at the level of your heart. It should be measured at least twice using the same arm. Certain conditions can cause a difference in blood pressure between your right and left arms. Certain factors can cause blood pressure readings to be lower or higher than normal (elevated) for a short period of time:  When your blood pressure is higher when you are in a health care provider's office than when you are at home, this is called white coat hypertension. Most people with this condition do not need medicines.  When your blood pressure is higher at home than when you are in a health care provider's office, this is called masked hypertension. Most people with this condition may need medicines to control blood pressure. If you have a high blood pressure reading during one visit or you have normal blood pressure with other risk factors:  You may be asked to return on a different day to have your blood pressure checked again.  You may be asked to monitor your blood pressure at home for 1 week or longer. If you are diagnosed with hypertension, you may have other blood or imaging tests to help your health care provider understand your overall risk for other conditions. How is this treated? This condition is treated by making healthy lifestyle changes,  such as eating healthy foods, exercising more, and reducing your alcohol intake. Your health care provider may prescribe medicine if lifestyle changes are not enough to get your blood pressure under control, and if:  Your systolic blood pressure is above 130.  Your diastolic blood pressure is above 80. Your personal target blood pressure may vary  depending on your medical conditions, your age, and other factors. Follow these instructions at home: Eating and drinking   Eat a diet that is high in fiber and potassium, and low in sodium, added sugar, and fat. An example eating plan is called the DASH (Dietary Approaches to Stop Hypertension) diet. To eat this way: ? Eat plenty of fresh fruits and vegetables. Try to fill half of your plate at each meal with fruits and vegetables. ? Eat whole grains, such as whole wheat pasta, brown rice, or whole grain bread. Fill about one quarter of your plate with whole grains. ? Eat or drink low-fat dairy products, such as skim milk or low-fat yogurt. ? Avoid fatty cuts of meat, processed or cured meats, and poultry with skin. Fill about one quarter of your plate with lean proteins, such as fish, chicken without skin, beans, eggs, and tofu. ? Avoid premade and processed foods. These tend to be higher in sodium, added sugar, and fat.  Reduce your daily sodium intake. Most people with hypertension should eat less than 1,500 mg of sodium a day.  Limit alcohol intake to no more than 1 drink a day for nonpregnant women and 2 drinks a day for men. One drink equals 12 oz of beer, 5 oz of wine, or 1 oz of hard liquor. Lifestyle   Work with your health care provider to maintain a healthy body weight or to lose weight. Ask what an ideal weight is for you.  Get at least 30 minutes of exercise that causes your heart to beat faster (aerobic exercise) most days of the week. Activities may include walking, swimming, or biking.  Include exercise to strengthen your muscles (resistance exercise), such as pilates or lifting weights, as part of your weekly exercise routine. Try to do these types of exercises for 30 minutes at least 3 days a week.  Do not use any products that contain nicotine or tobacco, such as cigarettes and e-cigarettes. If you need help quitting, ask your health care provider.  Monitor your blood  pressure at home as told by your health care provider.  Keep all follow-up visits as told by your health care provider. This is important. Medicines  Take over-the-counter and prescription medicines only as told by your health care provider. Follow directions carefully. Blood pressure medicines must be taken as prescribed.  Do not skip doses of blood pressure medicine. Doing this puts you at risk for problems and can make the medicine less effective.  Ask your health care provider about side effects or reactions to medicines that you should watch for. Contact a health care provider if:  You think you are having a reaction to a medicine you are taking.  You have headaches that keep coming back (recurring).  You feel dizzy.  You have swelling in your ankles.  You have trouble with your vision. Get help right away if:  You develop a severe headache or confusion.  You have unusual weakness or numbness.  You feel faint.  You have severe pain in your chest or abdomen.  You vomit repeatedly.  You have trouble breathing. Summary  Hypertension is when the force  of blood pumping through your arteries is too strong. If this condition is not controlled, it may put you at risk for serious complications.  Your personal target blood pressure may vary depending on your medical conditions, your age, and other factors. For most people, a normal blood pressure is less than 120/80.  Hypertension is treated with lifestyle changes, medicines, or a combination of both. Lifestyle changes include weight loss, eating a healthy, low-sodium diet, exercising more, and limiting alcohol. This information is not intended to replace advice given to you by your health care provider. Make sure you discuss any questions you have with your health care provider. Document Released: 11/21/2005 Document Revised: 10/19/2016 Document Reviewed: 10/19/2016 Elsevier Interactive Patient Education  2019 [employer].

## 2018-12-12 NOTE — ED Triage Notes (Signed)
PT sent from PCP for eval of HTN and headache. PT hx of HTN, takes meds as perscribed.  PT 176/106 in triage. NAD noted

## 2018-12-12 NOTE — ED Notes (Signed)
Tried calling for patient from lobby without answer.

## 2018-12-12 NOTE — ED Notes (Signed)
Called a second time for lobby without answer.

## 2018-12-14 ENCOUNTER — Telehealth: Payer: Self-pay | Admitting: Emergency Medicine

## 2018-12-14 NOTE — Telephone Encounter (Signed)
Called patient due to lwot to inquire about condition and follow up plans. She says she has a call in to pcp because she still has the headache and htn.  I told her that was fine, but she may return here at any time if needed.

## 2018-12-19 ENCOUNTER — Telehealth: Payer: Self-pay

## 2018-12-19 NOTE — Progress Notes (Signed)
[id number] 

## 2018-12-19 NOTE — Telephone Encounter (Signed)
-----   Message from Trinna Post, [place] sent at 12/17/2018  5:34 PM EST ----- Schedule for follow up of HTN as I increased her medication.

## 2018-12-19 NOTE — Telephone Encounter (Signed)
appt 01/15/2019

## 2019-01-15 ENCOUNTER — Ambulatory Visit: Payer: Commercial Managed Care - PPO | Admitting: Physician Assistant

## 2019-01-16 ENCOUNTER — Ambulatory Visit: Payer: Commercial Managed Care - PPO | Admitting: Physician Assistant

## 2019-01-24 ENCOUNTER — Ambulatory Visit: Payer: Commercial Managed Care - PPO | Admitting: Physician Assistant

## 2019-01-29 ENCOUNTER — Ambulatory Visit: Payer: Commercial Managed Care - PPO | Admitting: Physician Assistant

## 2019-01-29 NOTE — Progress Notes (Deleted)
° ° ° ° ° °  Patient: Grace Gallagher Female    DOB: [birth year]   48 y.o.   MRN: [id number] Visit Date: 01/29/2019  Today's Provider: Trinna Post, PA-C   No chief complaint on file.  Subjective:     HPI   Follow up for Hypertension  The patient was last seen for this {NUMBERS 1-12:18279} {days/wks/mos/yrs:310907} ago. Changes made at last visit include ***.  She reports {excellent/good/fair/poor:19665} compliance with treatment. She feels that condition is {improved/worse/unchanged:3041574}. She {ACTION; IS/IS QGB:20100712} having side effects. ***  ------------------------------------------------------------------------------------   Allergies  Allergen Reactions   Hydrocodone Nausea Only     Current Outpatient Medications:    amLODipine (NORVASC) 10 MG tablet, Take 1 tablet (10 mg total) by mouth daily., Disp: 30 tablet, Rfl: 6   atorvastatin (LIPITOR) 40 MG tablet, Take 40 mg by mouth daily. , Disp: , Rfl: 5   Dulaglutide 1.5 MG/0.5ML SOPN, Inject into the skin., Disp: , Rfl:    furosemide (LASIX) 40 MG tablet, Take 40 mg by mouth daily as needed for fluid or edema. , Disp: , Rfl: 5   Insulin Isophane & Regular Human (NOVOLIN 70/30 FLEXPEN RELION) (70-30) 100 UNIT/ML PEN, Inject into the skin., Disp: , Rfl:    losartan-hydrochlorothiazide (HYZAAR) 100-25 MG tablet, Take 1 tablet by mouth daily., Disp: , Rfl:    metFORMIN (GLUCOPHAGE) 1000 MG tablet, Take 1,000 mg by mouth 2 (two) times daily with a meal., Disp: , Rfl:    metoprolol succinate (TOPROL-XL) 25 MG 24 hr tablet, Take 25 mg daily along with 50 mg daily for total of 75 mg daily., Disp: 90 tablet, Rfl: 0   metoprolol succinate (TOPROL-XL) 50 MG 24 hr tablet, Take 50 mg by mouth daily. , Disp: , Rfl: 5   ondansetron (ZOFRAN ODT) 4 MG disintegrating tablet, Take 1 tablet (4 mg total) by mouth every 8 (eight) hours as needed for nausea or vomiting., Disp: 20 tablet, Rfl: 0  Review of Systems  Social  History   Tobacco Use   Smoking status: Never Smoker   Smokeless tobacco: Never Used  Substance Use Topics   Alcohol use: No      Objective:   LMP 04/03/2018  There were no vitals filed for this visit.   Physical Exam      Assessment & Plan        Trinna Post, PA-C  [facility]

## 2019-01-29 NOTE — Progress Notes (Deleted)
.  [facility] 

## 2019-02-14 ENCOUNTER — Ambulatory Visit: Payer: Commercial Managed Care - PPO | Admitting: Internal Medicine

## 2019-03-14 LAB — HEMOGLOBIN A1C: Hemoglobin A1C: 5.8

## 2019-04-02 ENCOUNTER — Telehealth: Payer: Self-pay | Admitting: Internal Medicine

## 2019-04-02 NOTE — Telephone Encounter (Signed)
PENDING

## 2019-04-25 NOTE — Progress Notes (Signed)
Virtual Visit via Video Note   This visit type was conducted due to national recommendations for restrictions regarding the COVID-19 Pandemic (e.g. social distancing) in an effort to limit this patient's exposure and mitigate transmission in our community.  Due to her co-morbid illnesses, this patient is at least at moderate risk for complications without adequate follow up.  This format is felt to be most appropriate for this patient at this time.  All issues noted in this document were discussed and addressed.  A limited physical exam was performed with this format.  Please refer to the patient's chart for her consent to telehealth for [facility].   Date:  04/26/2019   ID:  Grace Gallagher, DOB 07-07-1971, MRN [id number]  Patient Location: Home Provider Location: Office  PCP:  Trinna Post, PA-C  Cardiologist:  New - Jovanni Rash Electrophysiologist:  None   Evaluation Performed:  Consultation - Grace Gallagher was referred by [name] for the evaluation of hypertension.  Chief Complaint: Shortness of breath and leg swelling  History of Present Illness:    Grace Gallagher is a 48 y.o. female with history of uncontrolled hypertension, hyperlipidemia, type 2 diabetes mellitus, and obesity.  We are speaking today for management of hypertension.  She was seen in 2015 by Dr. Fletcher Anon for the same issue, and more recently by Dr. Tommi Rumps of [facility] cardiology in 2018.  She was seen as an new patient by [name] in early January, at which time blood pressure was severely elevated (180/124).  She was referred to the ED for hypertensive urgency and referred to [place] for follow-up.  Prior work-up in 2015 was notable for normal renal artery Doppler and aldosterone/renin ratio.  Exercise stress echo through [place] in 2018 was also performed (results not available).  [name] reports that her blood pressure has been elevated for some time but has improved over the last 1 to 2 months due to significant  lifestyle changes.  She is now following the keto diet and has lost 25 pounds since December.  Her hemoglobin A1c has also fallen from 10->5.5 with these changes.  Over the last month, she has checked her blood pressure periodically at work (she is a [employer] manager) and notes that typical readings have been around 120/80.  Leg edema has also improved, though [name] still has some swelling.  She has episodic orthopnea and PND, typically sleeping on 2-3 pillows.  She was diagnosed with obstructive sleep apnea in 2017 and purchased a CPAP device at that time.  However, she has misplaced it and has not used it for quite some time.  [name] denies chest pain, palpitations, and lightheadedness.  She is trying to exercise some, walking both at work and at home.  She also has access to a gym at work.  The patient does not have symptoms concerning for COVID-19 infection (fever, chills, cough, or new shortness of breath).    Past Medical History:  Diagnosis Date  . Anemia   . Diabetes mellitus without complication ([place])   . Hypertension   . Obesity    Past Surgical History:  Procedure Laterality Date  . ABDOMINAL HYSTERECTOMY    . BREAST SURGERY  08/2011   breast reduction  . CYSTOSCOPY  04/12/2018   Procedure: CYSTOSCOPY;  Surgeon: Gae Dry, MD;  Location: [facility] ORS;  Service: Gynecology;;  . FOOT SURGERY Left    cyst removed  . GALLBLADDER SURGERY  09/03/2018  . LAPAROSCOPIC HYSTERECTOMY Bilateral 04/12/2018  Procedure: HYSTERECTOMY TOTAL LAPAROSCOPIC BILATERAL SALPINGECTOMY;  Surgeon: Gae Dry, MD;  Location: [facility] ORS;  Service: Gynecology;  Laterality: Bilateral;  . TUBAL LIGATION       Current Meds  Medication Sig  . amLODipine (NORVASC) 10 MG tablet Take 1 tablet (10 mg total) by mouth daily.  Marland Kitchen atorvastatin (LIPITOR) 40 MG tablet Take 40 mg by mouth daily.   . furosemide (LASIX) 40 MG tablet Take 40 mg by mouth daily as needed for fluid or edema.   Marland Kitchen losartan-hydrochlorothiazide  (HYZAAR) 100-25 MG tablet Take 1 tablet by mouth daily.  . metFORMIN (GLUCOPHAGE) 1000 MG tablet Take 1,000 mg by mouth 2 (two) times daily with a meal.  . metoprolol succinate (TOPROL-XL) 25 MG 24 hr tablet Take 25 mg daily along with 50 mg daily for total of 75 mg daily.  . metoprolol succinate (TOPROL-XL) 50 MG 24 hr tablet Take 50 mg by mouth daily.      Allergies:   Hydrocodone   Social History   Tobacco Use  . Smoking status: Never Smoker  . Smokeless tobacco: Never Used  Substance Use Topics  . Alcohol use: No  . Drug use: No     Family Hx: The patient's family history includes Cancer in her father; Diabetes in her brother; Heart attack (age of onset: 2) in her mother; Heart attack (age of onset: 33) in her brother; Heart failure in her mother; Hypertension in her brother and mother.  ROS:   Please see the history of present illness.   All other systems reviewed and are negative.   Prior CV studies:   The following studies were reviewed today:  Renal artery Doppler (02/18/2014): Technically challenging secondary to respirations and bowel gas.  No evidence of renal artery stenosis.  Normal caliber aorta.  Echocardiogram (02/13/2014): Normal LV size.  LVEF 55-65% with normal wall motion and diastolic function.  Normal RV size and function.  No significant valvular abnormality.  Labs/Other Tests and Data Reviewed:    EKG:  An ECG dated December 12, 2018 was personally reviewed today and demonstrated:  Normal sinus rhythm without significant abnormality.  Recent Labs: 07/16/2018: B Natriuretic Peptide 13.0 11/13/2018: ALT 34; BUN 17; Creatinine, Ser 0.81; Hemoglobin 12.2; Platelets 155; Potassium 4.0; Sodium 132   Recent Lipid Panel Lab Results  Component Value Date/Time   CHOL 166 07/01/2013 03:58 PM   TRIG 98.0 07/01/2013 03:58 PM   HDL 53.00 07/01/2013 03:58 PM   CHOLHDL 3 07/01/2013 03:58 PM   LDLCALC 93 07/01/2013 03:58 PM    Wt Readings from Last 3 Encounters:   04/26/19 245 lb (111.1 kg)  12/12/18 260 lb 9.6 oz (118.2 kg)  11/13/18 269 lb (122 kg)     Objective:    Vital Signs:  Ht 5\' 6"  (1.676 m)   Wt 245 lb (111.1 kg)   LMP 04/03/2018   BMI 39.54 kg/m    VITAL SIGNS:  reviewed GEN:  no acute distress CARDIOVASCULAR:  Trace lower extremity edema noted  ASSESSMENT & PLAN:    Essential hypertension: Ms. Aguado has had difficult to control blood pressure in the past.  However, with 25 pound weight loss and dietary changes (including sodium restriction), her blood pressure has improved a lot.  I have encouraged her to continue with diet and exercise.  Given persistent leg edema and reasonable blood pressures at work over the last month, I think it is worthwhile to decrease amlodipine to 5 mg daily.  Ms. Bihl should  continue the remainder of her medications.  I also encouraged her to look for her CPAP device or to contact her home health provider to inquire about receiving a new device.  Shortness of breath and leg swelling: The symptoms are likely multifactorial and have improved with weight loss and dietary changes.  Echocardiogram in 2015 was unrevealing.  Hopefully, the symptoms will improve with continued weight loss.  As above, I have encouraged Ms. Venuti to begin using her CPAP again.  If she is unable to find her CPAP machine, we will need to consider referral to pulmonology for management of her sleep apnea.  We will continue her current dose of furosemide 40 mg, which she is using on a daily basis.  We will defer repeating an echocardiogram and pursuing ischemia evaluation unless symptoms worsen.  Morbid obesity: I congratulated Ms. Kingsbury on her 25 pound weight loss so far this year.  Her BMI remains above 35 with multiple comorbidities (hypertension diabetes mellitus, and sleep apnea).  I encouraged her to continue weight loss through diet and exercise.  COVID-19 Education: The signs and symptoms of COVID-19 were discussed with the patient  and how to seek care for testing (follow up with PCP or arrange E-visit).  The importance of social distancing was discussed today.  Time:   Today, I have spent 15 minutes with the patient with telehealth technology discussing the above problems.  An additional 15 minutes were spent reviewing the patient's chart and documenting today's encounter.  Medication Adjustments/Labs and Tests Ordered: Current medicines are reviewed at length with the patient today.  Concerns regarding medicines are outlined above.   Tests Ordered: None.  Medication Changes: Meds ordered this encounter  Medications  . furosemide (LASIX) 40 MG tablet    Sig: Take 1 tablet (40 mg total) by mouth daily.    Dispense:  90 tablet    Refill:  1  . DISCONTD: amLODipine (NORVASC) 10 MG tablet    Sig: Take 0.5 tablets (5 mg total) by mouth daily.    Dispense:  30 tablet    Refill:  6  . amLODipine (NORVASC) 5 MG tablet    Sig: Take 1 tablet (5 mg total) by mouth daily.    Dispense:  90 tablet    Refill:  1    Disposition:  Follow up in 2 month(s) in the office.  Signed, Nelva Bush, MD  04/26/2019 10:05 AM    [place]

## 2019-04-26 ENCOUNTER — Telehealth: Payer: Self-pay | Admitting: Physician Assistant

## 2019-04-26 ENCOUNTER — Encounter: Payer: Self-pay | Admitting: Internal Medicine

## 2019-04-26 ENCOUNTER — Telehealth: Payer: Self-pay | Admitting: *Deleted

## 2019-04-26 ENCOUNTER — Telehealth (INDEPENDENT_AMBULATORY_CARE_PROVIDER_SITE_OTHER): Payer: Commercial Managed Care - PPO | Admitting: Internal Medicine

## 2019-04-26 ENCOUNTER — Other Ambulatory Visit: Payer: Self-pay

## 2019-04-26 VITALS — Ht 66.0 in | Wt 245.0 lb

## 2019-04-26 DIAGNOSIS — I10 Essential (primary) hypertension: Secondary | ICD-10-CM | POA: Diagnosis not present

## 2019-04-26 DIAGNOSIS — M7989 Other specified soft tissue disorders: Secondary | ICD-10-CM | POA: Diagnosis not present

## 2019-04-26 DIAGNOSIS — R0602 Shortness of breath: Secondary | ICD-10-CM | POA: Diagnosis not present

## 2019-04-26 MED ORDER — FUROSEMIDE 40 MG PO TABS: 40.0000 mg | ORAL_TABLET | Freq: Every day | ORAL | 1 refills | Status: DC

## 2019-04-26 MED ORDER — AMLODIPINE BESYLATE 5 MG PO TABS: 5.0000 mg | ORAL_TABLET | Freq: Every day | ORAL | 1 refills | Status: DC

## 2019-04-26 MED ORDER — AMLODIPINE BESYLATE 10 MG PO TABS: 5.0000 mg | ORAL_TABLET | Freq: Every day | ORAL | 6 refills | Status: DC

## 2019-04-26 NOTE — Telephone Encounter (Signed)
Can we abstract A1c from [facility] and any available foot or ophthalmology exam, thank you.

## 2019-04-26 NOTE — Telephone Encounter (Signed)
Virtual Visit Pre-Appointment Phone Call  "(Name), I am calling you today to discuss your upcoming appointment. We are currently trying to limit exposure to the virus that causes COVID-19 by seeing patients at home rather than in the office."  1. "What is the BEST phone number to call the day of the visit?" - include this in appointment notes  2. "Do you have or have access to (through a family member/friend) a smartphone with video capability that we can use for your visit?" a. If yes - list this number in appt notes as "cell" (if different from BEST phone #) and list the appointment type as a VIDEO visit in appointment notes b. If no - list the appointment type as a PHONE visit in appointment notes  3. Confirm consent - "In the setting of the current Covid19 crisis, you are scheduled for a (phone or video) visit with your provider on (04/26/19) at (10:00).  Just as we do with many in-office visits, in order for you to participate in this visit, we must obtain consent.  If you'd like, I can send this to your mychart (if signed up) or email for you to review.  Otherwise, I can obtain your verbal consent now.  All virtual visits are billed to your insurance company just like a normal visit would be.  By agreeing to a virtual visit, we'd like you to understand that the technology does not allow for your provider to perform an examination, and thus may limit your provider's ability to fully assess your condition. If your provider identifies any concerns that need to be evaluated in person, we will make arrangements to do so.  Finally, though the technology is pretty good, we cannot assure that it will always work on either your or our end, and in the setting of a video visit, we may have to convert it to a phone-only visit.  In either situation, we cannot ensure that we have a secure connection.  Are you willing to proceed?" YES  4. Advise patient to be prepared - "Two hours prior to your appointment,  go ahead and check your blood pressure, pulse, oxygen saturation, and your weight (if you have the equipment to check those) and write them all down. When your visit starts, your provider will ask you for this information. If you have an Apple Watch or Kardia device, please plan to have heart rate information ready on the day of your appointment. Please have a pen and paper handy nearby the day of the visit as well."  5. Give patient instructions for MyChart download to smartphone OR Doximity/Doxy.me as below if video visit (depending on what platform provider is using)  6. Inform patient they will receive a phone call 15 minutes prior to their appointment time (may be from unknown caller ID) so they should be prepared to answer    TELEPHONE CALL NOTE  Grace Gallagher has been deemed a candidate for a follow-up tele-health visit to limit community exposure during the Covid-19 pandemic. I spoke with the patient via phone to ensure availability of phone/video source, confirm preferred email & phone number, and discuss instructions and expectations.  I reminded Grace Gallagher to be prepared with any vital sign and/or heart rhythm information that could potentially be obtained via home monitoring, at the time of her visit. I reminded Grace Gallagher to expect a phone call prior to her visit.  Britt Bottom, CMA 04/26/2019 9:42 AM   INSTRUCTIONS  FOR DOWNLOADING THE MYCHART APP TO SMARTPHONE  - The patient must first make sure to have activated MyChart and know their login information - If Apple, go to [employer] and type in MyChart in the search bar and download the app. If Android, ask patient to go to [employer] and type in [place] in the search bar and download the app. The app is free but as with any other app downloads, their phone may require them to verify saved payment information or Apple/Android password.  - The patient will need to then log into the app with their MyChart username and  password, and select [facility] as their healthcare provider to link the account. When it is time for your visit, go to the MyChart app, find appointments, and click Begin Video Visit. Be sure to Select Allow for your device to access the Microphone and Camera for your visit. You will then be connected, and your provider will be with you shortly.  **If they have any issues connecting, or need assistance please contact MyChart service desk [phone])**  **If using a computer, in order to ensure the best quality for their visit they will need to use either of the following Internet Browsers: [employer], or Google Chrome**  IF USING DOXIMITY or DOXY.ME - The patient will receive a link just prior to their visit by text.     FULL LENGTH CONSENT FOR TELE-HEALTH VISIT   I hereby voluntarily request, consent and authorize [facility] and its employed or contracted physicians, physician assistants, nurse practitioners or other licensed health care professionals (the Practitioner), to provide me with telemedicine health care services (the "Services") as deemed necessary by the treating Practitioner. I acknowledge and consent to receive the Services by the Practitioner via telemedicine. I understand that the telemedicine visit will involve communicating with the Practitioner through live audiovisual communication technology and the disclosure of certain medical information by electronic transmission. I acknowledge that I have been given the opportunity to request an in-person assessment or other available alternative prior to the telemedicine visit and am voluntarily participating in the telemedicine visit.  I understand that I have the right to withhold or withdraw my consent to the use of telemedicine in the course of my care at any time, without affecting my right to future care or treatment, and that the Practitioner or I may terminate the telemedicine visit at any time. I  understand that I have the right to inspect all information obtained and/or recorded in the course of the telemedicine visit and may receive copies of available information for a reasonable fee.  I understand that some of the potential risks of receiving the Services via telemedicine include:  Marland Kitchen Delay or interruption in medical evaluation due to technological equipment failure or disruption; . Information transmitted may not be sufficient (e.g. poor resolution of images) to allow for appropriate medical decision making by the Practitioner; and/or  . In rare instances, security protocols could fail, causing a breach of personal health information.  Furthermore, I acknowledge that it is my responsibility to provide information about my medical history, conditions and care that is complete and accurate to the best of my ability. I acknowledge that Practitioner's advice, recommendations, and/or decision may be based on factors not within their control, such as incomplete or inaccurate data provided by me or distortions of diagnostic images or specimens that may result from electronic transmissions. I understand that the practice of medicine is not an exact science and  that Practitioner makes no warranties or guarantees regarding treatment outcomes. I acknowledge that I will receive a copy of this consent concurrently upon execution via email to the email address I last provided but may also request a printed copy by calling the office of [facility].    I understand that my insurance will be billed for this visit.   I have read or had this consent read to me. . I understand the contents of this consent, which adequately explains the benefits and risks of the Services being provided via telemedicine.  . I have been provided ample opportunity to ask questions regarding this consent and the Services and have had my questions answered to my satisfaction. . I give my informed consent for the services to be  provided through the use of telemedicine in my medical care  By participating in this telemedicine visit I agree to the above.

## 2019-04-26 NOTE — Patient Instructions (Addendum)
Medication Instructions:  Your physician has recommended you make the following change in your medication:  1- DECREASE Amlodipine to 5 mg by mouth once a day. 2- You may take furosemide daily  If you need a refill on your cardiac medications before your next appointment, please call your pharmacy.   Lab work: none If you have labs (blood work) drawn today and your tests are completely normal, you will receive your results only by: Marland Kitchen MyChart Message (if you have MyChart) OR . A paper copy in the mail If you have any lab test that is abnormal or we need to change your treatment, we will call you to review the results.  Testing/Procedures: none  Follow-Up: At [facility], you and your health needs are our priority.  As part of our continuing mission to provide you with exceptional heart care, we have created designated Provider Care Teams.  These Care Teams include your primary Cardiologist (physician) and Advanced Practice Providers (APPs -  Physician Assistants and Nurse Practitioners) who all work together to provide you with the care you need, when you need it. You will need a follow up appointment in 2 months.  Please call our office 2 months in advance to schedule this appointment.  You may see DR Harrell Gave END or one of the following Advanced Practice Providers on your designated Care Team:   Murray Hodgkins, NP Christell Faith, PA-C . Marrianne Mood, PA-C

## 2019-04-26 NOTE — Telephone Encounter (Signed)
A1C abstracted.

## 2019-11-22 ENCOUNTER — Other Ambulatory Visit: Payer: Self-pay | Admitting: Internal Medicine

## 2019-12-21 ENCOUNTER — Emergency Department (HOSPITAL_COMMUNITY): Payer: Commercial Managed Care - PPO

## 2019-12-21 ENCOUNTER — Inpatient Hospital Stay (HOSPITAL_COMMUNITY)
Admission: EM | Admit: 2019-12-21 | Discharge: 2019-12-23 | DRG: 177 | Disposition: A | Discharge status: Home / Self Care | Payer: Commercial Managed Care - PPO | Attending: Family Medicine | Admitting: Family Medicine

## 2019-12-21 ENCOUNTER — Other Ambulatory Visit: Payer: Self-pay

## 2019-12-21 ENCOUNTER — Encounter (HOSPITAL_COMMUNITY): Payer: Self-pay | Admitting: *Deleted

## 2019-12-21 DIAGNOSIS — R0902 Hypoxemia: Secondary | ICD-10-CM | POA: Diagnosis not present

## 2019-12-21 DIAGNOSIS — U071 COVID-19: Secondary | ICD-10-CM | POA: Diagnosis not present

## 2019-12-21 DIAGNOSIS — Z7984 Long term (current) use of oral hypoglycemic drugs: Secondary | ICD-10-CM

## 2019-12-21 DIAGNOSIS — Z809 Family history of malignant neoplasm, unspecified: Secondary | ICD-10-CM

## 2019-12-21 DIAGNOSIS — I10 Essential (primary) hypertension: Secondary | ICD-10-CM | POA: Diagnosis present

## 2019-12-21 DIAGNOSIS — G4733 Obstructive sleep apnea (adult) (pediatric): Secondary | ICD-10-CM | POA: Diagnosis present

## 2019-12-21 DIAGNOSIS — E1169 Type 2 diabetes mellitus with other specified complication: Secondary | ICD-10-CM | POA: Diagnosis present

## 2019-12-21 DIAGNOSIS — D509 Iron deficiency anemia, unspecified: Secondary | ICD-10-CM | POA: Diagnosis present

## 2019-12-21 DIAGNOSIS — E785 Hyperlipidemia, unspecified: Secondary | ICD-10-CM | POA: Diagnosis present

## 2019-12-21 DIAGNOSIS — Z8249 Family history of ischemic heart disease and other diseases of the circulatory system: Secondary | ICD-10-CM

## 2019-12-21 DIAGNOSIS — J1282 Pneumonia due to coronavirus disease 2019: Secondary | ICD-10-CM | POA: Diagnosis present

## 2019-12-21 DIAGNOSIS — Z833 Family history of diabetes mellitus: Secondary | ICD-10-CM

## 2019-12-21 LAB — CBC WITH DIFFERENTIAL/PLATELET
Abs Immature Granulocytes: 0.01 10*3/uL (ref 0.00–0.07)
Basophils Absolute: 0 10*3/uL (ref 0.0–0.1)
Basophils Relative: 0 %
Eosinophils Absolute: 0 10*3/uL (ref 0.0–0.5)
Eosinophils Relative: 0 %
HCT: 37.2 % (ref 36.0–46.0)
Hemoglobin: 11.7 g/dL — ABNORMAL LOW (ref 12.0–15.0)
Immature Granulocytes: 0 %
Lymphocytes Relative: 27 %
Lymphs Abs: 1.1 10*3/uL (ref 0.7–4.0)
MCH: 23.4 pg — ABNORMAL LOW (ref 26.0–34.0)
MCHC: 31.5 g/dL (ref 30.0–36.0)
MCV: 74.3 fL — ABNORMAL LOW (ref 80.0–100.0)
Monocytes Absolute: 0.3 10*3/uL (ref 0.1–1.0)
Monocytes Relative: 8 %
Neutro Abs: 2.6 10*3/uL (ref 1.7–7.7)
Neutrophils Relative %: 65 %
Platelets: 144 10*3/uL — ABNORMAL LOW (ref 150–400)
RBC: 5.01 MIL/uL (ref 3.87–5.11)
RDW: 14 % (ref 11.5–15.5)
WBC: 4.1 10*3/uL (ref 4.0–10.5)
nRBC: 0 % (ref 0.0–0.2)

## 2019-12-21 LAB — COMPREHENSIVE METABOLIC PANEL
ALT: 26 U/L (ref 0–44)
AST: 20 U/L (ref 15–41)
Albumin: 3.5 g/dL (ref 3.5–5.0)
Alkaline Phosphatase: 58 U/L (ref 38–126)
Anion gap: 11 (ref 5–15)
BUN: 8 mg/dL (ref 6–20)
CO2: 23 mmol/L (ref 22–32)
Calcium: 8.3 mg/dL — ABNORMAL LOW (ref 8.9–10.3)
Chloride: 101 mmol/L (ref 98–111)
Creatinine, Ser: 0.76 mg/dL (ref 0.44–1.00)
GFR calc Af Amer: >60 mL/min (ref >60)
GFR calc non Af Amer: >60 mL/min (ref >60)
Glucose, Bld: 106 mg/dL — ABNORMAL HIGH (ref 70–99)
Potassium: 3.2 mmol/L — ABNORMAL LOW (ref 3.5–5.1)
Sodium: 135 mmol/L (ref 135–145)
Total Bilirubin: 0.7 mg/dL (ref 0.3–1.2)
Total Protein: 6.6 g/dL (ref 6.5–8.1)

## 2019-12-21 LAB — I-STAT BETA HCG BLOOD, ED (MC, WL, AP ONLY): I-stat hCG, quantitative: <5 m[IU]/mL (ref <5)

## 2019-12-21 LAB — POC SARS CORONAVIRUS 2 AG -  ED: SARS Coronavirus 2 Ag: POSITIVE — AB

## 2019-12-21 LAB — D-DIMER, QUANTITATIVE: D-Dimer, Quant: 0.44 ug/mL-FEU (ref 0.00–0.50)

## 2019-12-21 LAB — LACTIC ACID, PLASMA: Lactic Acid, Venous: 0.9 mmol/L (ref 0.5–1.9)

## 2019-12-21 LAB — C-REACTIVE PROTEIN: CRP: 1.9 mg/dL — ABNORMAL HIGH (ref <1.0)

## 2019-12-21 LAB — FERRITIN: Ferritin: 126 ng/mL (ref 11–307)

## 2019-12-21 LAB — LACTATE DEHYDROGENASE: LDH: 215 U/L — ABNORMAL HIGH (ref 98–192)

## 2019-12-21 LAB — TRIGLYCERIDES: Triglycerides: 70 mg/dL (ref <150)

## 2019-12-21 LAB — FIBRINOGEN: Fibrinogen: 446 mg/dL (ref 210–475)

## 2019-12-21 MED ORDER — POTASSIUM CHLORIDE CRYS ER 20 MEQ PO TBCR
40.0000 meq | EXTENDED_RELEASE_TABLET | Freq: Once | ORAL | Status: AC
  Administered 2019-12-22: 40 meq via ORAL
  Filled 2019-12-21: qty 2

## 2019-12-21 MED ORDER — DEXAMETHASONE SODIUM PHOSPHATE 10 MG/ML IJ SOLN
6.0000 mg | Freq: Once | INTRAMUSCULAR | Status: AC
  Administered 2019-12-21: 6 mg via INTRAVENOUS
  Filled 2019-12-21: qty 1

## 2019-12-21 MED ORDER — ACETAMINOPHEN 325 MG PO TABS
650.0000 mg | ORAL_TABLET | Freq: Once | ORAL | Status: AC
  Administered 2019-12-21: 650 mg via ORAL
  Filled 2019-12-21: qty 2

## 2019-12-21 NOTE — ED Triage Notes (Signed)
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

## 2019-12-21 NOTE — ED Notes (Signed)
Pt maintained O2 sats above 90 while ambulating in the room. Pt's O2 dropped to 90% for a few secs and went up 93% and maintained while ambulating.

## 2019-12-21 NOTE — ED Notes (Signed)
Ambulated pt in room by walking around and walking in place. Oxygen saturation dropped from 96% RA to 90% RA. After ambulation oxygen went back to 95%. Respirations went to 30. Pt states she does not feel short of breath nor have chest pain.

## 2019-12-21 NOTE — ED Provider Notes (Signed)
[facility] EMERGENCY DEPARTMENT Provider Note   CSN: [place]:[id number] Arrival date & time: 12/21/19  1724     History Chief Complaint  Patient presents with  . Chills    Grace Gallagher is a 49 y.o. female with a past medical history significant for anemia, diabetes, and hypertension who presents to the ED due to fever and chills x3 to 4 days.  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  Patient notes that she felt fine yesterday, but last night significantly decompensated and felt feverish with chills. T. Max 100.9 on Thursday. Patient admits to an intermitent dry cough. She has tried Tylenol, Alka-Seltzer plus, and vitamin C with mild relief.  Patient denies shortness of breath and chest pain. Patient admits to chronic lower extremity edema that has not changed over the past few days. Patient denies abdominal pain, nausea, vomiting, and diarrhea.     Past Medical History:  Diagnosis Date  . Anemia   . Diabetes mellitus without complication ([place])   . Hypertension   . Obesity     Patient Active Problem List   Diagnosis Date Noted  . Leg swelling 04/26/2019  . Morbid obesity ([place]) 04/26/2019  . Hyperlipidemia associated with type 2 diabetes mellitus ([place]) 10/25/2018  . OSA (obstructive sleep apnea) 10/24/2018  . AKI (acute kidney injury) ([place]) 07/25/2018  . Elevated bilirubin   . Gallstones   . Iron deficiency anemia 05/08/2018  . H/O abdominal hysterectomy 05/08/2018  . Fibroid 04/05/2018  . Family history of early CAD 04/12/2017  . Uncontrolled type 2 diabetes mellitus without complication, without long-term current use of insulin 04/12/2017  . Lower respiratory infection 07/07/2015  . Bacterial vaginosis 07/07/2015  . Edema 03/26/2015  . Anal itching 10/14/2014  . Head or neck swelling, mass, or lump 09/25/2014  . Vaginitis and vulvovaginitis 09/10/2014  . Dizziness and giddiness  06/02/2014  . Other malaise and fatigue 05/30/2014  . Acute sinusitis with symptoms > 10 days 04/09/2014  . Essential hypertension, malignant 02/04/2014  . Shortness of breath 02/04/2014  . Allergic rhinitis 01/29/2014  . Hypokalemia 01/10/2014  . Back pain 01/06/2014  . Bilateral lower abdominal cramping 08/29/2013  . Menorrhagia 08/29/2013  . Encounter for routine gynecological examination 07/01/2013  . Headache(784.0) 03/31/2013  . Essential hypertension 02/21/2013  . Morbid obesity with BMI of 40.0-44.9, adult ([place]) 02/21/2013  . Microcytic anemia 02/21/2013    Past Surgical History:  Procedure Laterality Date  . ABDOMINAL HYSTERECTOMY    . BREAST SURGERY  08/2011   breast reduction  . CYSTOSCOPY  04/12/2018   Procedure: CYSTOSCOPY;  Surgeon: Gae Dry, MD;  Location: [facility] ORS;  Service: Gynecology;;  . FOOT SURGERY Left    cyst removed  . GALLBLADDER SURGERY  09/03/2018  . LAPAROSCOPIC HYSTERECTOMY Bilateral 04/12/2018   Procedure: HYSTERECTOMY TOTAL LAPAROSCOPIC BILATERAL SALPINGECTOMY;  Surgeon: Gae Dry, MD;  Location: [facility] ORS;  Service: Gynecology;  Laterality: Bilateral;  . TUBAL LIGATION       OB History    Gravida  2   Para  2   Term  0   Preterm  2   AB      Living  2     SAB      TAB      Ectopic      Multiple      Live Births  2  Family History  Problem Relation Age of Onset  . Hypertension Mother   . Heart failure Mother   . Heart attack Mother 47  . Cancer Father   . Diabetes Brother   . Hypertension Brother   . Heart attack Brother 50    Social History   Tobacco Use  . Smoking status: Never Smoker  . Smokeless tobacco: Never Used  Substance Use Topics  . Alcohol use: No  . Drug use: No    Home Medications Prior to Admission medications   Medication Sig Start Date End Date Taking? Authorizing Provider  atorvastatin (LIPITOR) 40 MG tablet Take 40 mg by mouth daily as needed (cholesterol).    Yes  [provider]  ferrous gluconate (FERGON) 324 MG tablet Take 324 mg by mouth every morning. 11/08/19  Yes [provider]  furosemide (LASIX) 40 MG tablet Take 1 tablet (40 mg total) by mouth daily. NEEDS OFFICE VISIT FOR FURTHER REFILLS. 11/22/19  Yes End, Harrell Gave, MD  metoprolol succinate (TOPROL-XL) 100 MG 24 hr tablet Take 100 mg by mouth daily. 12/19/19  Yes [provider]  olmesartan (BENICAR) 40 MG tablet Take 40 mg by mouth daily. 11/28/19  Yes [provider]  amLODipine (NORVASC) 5 MG tablet Take 1 tablet (5 mg total) by mouth daily. Patient not taking: Reported on 12/21/2019 04/26/19 12/21/19  End, Harrell Gave, MD  metFORMIN (GLUCOPHAGE) 1000 MG tablet Take 1,000 mg by mouth 2 (two) times daily with a meal.    [provider]    Allergies    Hydrocodone  Review of Systems   Review of Systems  Constitutional: Positive for chills and fever.  Respiratory: Positive for cough. Negative for shortness of breath.   Cardiovascular: Positive for leg swelling (chronic). Negative for chest pain and palpitations.  Gastrointestinal: Negative for abdominal pain, diarrhea, nausea and vomiting.  Neurological: Negative for weakness and headaches.  All other systems reviewed and are negative.   Physical Exam Updated Vital Signs BP 116/62   Pulse 95   Temp 99.6 F (37.6 C) (Oral)   Resp 20   Ht 5\' 6"  (1.676 m)   Wt 119.3 kg   LMP 04/03/2018   SpO2 94%   BMI 42.45 kg/m   Physical Exam Vitals and nursing note reviewed.  Constitutional:      General: She is not in acute distress.    Appearance: She is not toxic-appearing.  HENT:     Head: Normocephalic.     Nose: Nose normal.  Eyes:     Pupils: Pupils are equal, round, and reactive to light.  Neck:     Comments: Nn meningismus Cardiovascular:     Rate and Rhythm: Regular rhythm. Tachycardia present.     Pulses: Normal pulses.     Heart sounds: Normal heart sounds. No murmur. No  friction rub. No gallop.   Pulmonary:     Effort: Pulmonary effort is normal.     Breath sounds: Normal breath sounds.     Comments: Respirations equal and unlabored, patient able to speak in full sentences, lungs clear to auscultation bilaterally Abdominal:     General: Abdomen is flat. Bowel sounds are normal. There is no distension.     Palpations: Abdomen is soft.     Tenderness: There is no abdominal tenderness. There is no guarding or rebound.  Musculoskeletal:     Cervical back: Neck supple.     Comments: Able to move all 4 extremities without difficulty. Trace nonpitting lower extremity  edema. Sensation and pulses intact in lower extremity  Skin:    General: Skin is warm.  Neurological:     General: No focal deficit present.     Mental Status: She is alert.     ED Results / Procedures / Treatments   Labs (all labs ordered are listed, but only abnormal results are displayed) Labs Reviewed  CBC WITH DIFFERENTIAL/PLATELET - Abnormal; Notable for the following components:      Result Value   Hemoglobin 11.7 (*)    MCV 74.3 (*)    MCH 23.4 (*)    Platelets 144 (*)    All other components within normal limits  COMPREHENSIVE METABOLIC PANEL - Abnormal; Notable for the following components:   Potassium 3.2 (*)    Glucose, Bld 106 (*)    Calcium 8.3 (*)    All other components within normal limits  LACTATE DEHYDROGENASE - Abnormal; Notable for the following components:   LDH 215 (*)    All other components within normal limits  C-REACTIVE PROTEIN - Abnormal; Notable for the following components:   CRP 1.9 (*)    All other components within normal limits  POC SARS CORONAVIRUS 2 AG -  ED - Abnormal; Notable for the following components:   SARS Coronavirus 2 Ag POSITIVE (*)    All other components within normal limits  CULTURE, BLOOD (ROUTINE X 2)  CULTURE, BLOOD (ROUTINE X 2)  LACTIC ACID, PLASMA  D-DIMER, QUANTITATIVE (NOT AT [facility])  FERRITIN  TRIGLYCERIDES  FIBRINOGEN   PROCALCITONIN  I-STAT BETA HCG BLOOD, ED ([facility], [facility], [facility] ONLY)    EKG EKG Interpretation  Date/Time:  Saturday December 21 2019 19:51:55 EST Ventricular Rate:  95 PR Interval:    QRS Duration: 85 QT Interval:  357 QTC Calculation: 449 R Axis:   58 Text Interpretation: Sinus rhythm Right atrial enlargement Borderline repolarization abnormality Confirmed by Quintella Reichert [phone]) on 12/21/2019 8:17:35 PM   Radiology DG Chest Port 1 View  Result Date: 12/21/2019 CLINICAL DATA:  COVID-19 positive, hypoxic with ambulation and chills EXAM: PORTABLE CHEST 1 VIEW COMPARISON:  Radiograph 10/23/2018 FINDINGS: Bibasilar mixed interstitial and airspace opacity. No pneumothorax. No visible effusion the portion of the left costophrenic sulcus is collimated. The aorta is calcified. The remaining cardiomediastinal contours are unremarkable. No acute osseous or soft tissue abnormality. IMPRESSION: Bibasilar mixed interstitial and airspace opacities could reflect atypical infection/pneumonia in the setting of COVID-19 positivity. Electronically Signed   By: Lovena Le M.D.   On: 12/21/2019 20:54    Procedures Procedures (including critical care time)  Medications Ordered in ED Medications  dexamethasone (DECADRON) injection 6 mg (has no administration in time range)  potassium chloride SA (KLOR-CON) CR tablet 40 mEq (has no administration in time range)  acetaminophen (TYLENOL) tablet 650 mg (650 mg Oral Given 12/21/19 1957)    ED Course  I have reviewed the triage vital signs and the nursing notes.  Pertinent labs & imaging results that were available during my care of the patient were reviewed by me and considered in my medical decision making (see chart for details).  Clinical Course as of Dec 20 2332  Sat Dec 21, 2019  2332 Spoke to Dr. Criss Rosales who agrees to evaluate and admit patient for further COVID treatment given her new oxygen require and hypoxia.    [CA]    Clinical Course User  Index [CA] Suzy Bouchard, PA-C   MDM Rules/Calculators/A&P  49 year old female presents to the ED due to fever and chills x3-4 days. 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 Upon arrival, patient is febrile at 101.4, tachycardic at 105 with O2 sats at 95% on room air. Patient in no acute distress and non-toxic appearing. Physical exam reassuring. Lungs clear to auscultation bilaterally. Abdomen soft, non-distended, and non-tender. No lower extremity edema. Negative homans sign bilaterally. Rapid COVID test positive.   Patient ambulated in room and dropped down to 90% on room air. Discussed case with Dr. Ralene Bathe who evaluated patient at bedside and recommends pre-COVID admission labs given patient was hypoxic with ambulation. Orders placed.  CMP significant for hypokalemia at 3.2, but otherwise reassuring. Repleted. CBC reassuring with no leukocytosis and mild thrombocytopenia at 144. LDH elevated at 215. D-dimer, fibrinogen, TGs, and ferritin normal. EKG reviewed which demonstrates sinus rhythm with no signs of ischemia. CXR personally reviewed which demonstrates: Bibasilar mixed interstitial and airspace opacities could reflect  atypical infection/pneumonia in the setting of COVID-19 positivity.   Patient placed on 2L [place] given hypoxia. Decadron given as well. Will consult for admission given new oxygen requirement and hypoxia. Spoke to Dr. Criss Rosales who agrees to evaluate and admit patient for further treatment.    Kim Calvo Coby was evaluated in Emergency Department on 12/21/2019 for the symptoms described in the history of present illness. She was evaluated in the context of the global COVID-19 pandemic, which necessitated consideration that the patient might be at risk for infection with the SARS-CoV-2 virus that causes COVID-19. Institutional protocols and algorithms that pertain to the evaluation of patients at risk for  COVID-19 are in a state of rapid change based on information released by regulatory bodies including the CDC and federal and state organizations. These policies and algorithms were followed during the patient's care in the ED.  Final Clinical Impression(s) / ED Diagnoses Final diagnoses:  [id number] virus infection  Hypoxia    Rx / DC Orders ED Discharge Orders    None       Karie Kirks 12/21/19 2338    Quintella Reichert, MD 12/23/19 [phone]

## 2019-12-21 NOTE — ED Triage Notes (Signed)
The pt has had a temp and chills for 3-4 days  lnp none

## 2019-12-22 DIAGNOSIS — E1169 Type 2 diabetes mellitus with other specified complication: Secondary | ICD-10-CM

## 2019-12-22 DIAGNOSIS — U071 COVID-19: Secondary | ICD-10-CM | POA: Diagnosis present

## 2019-12-22 DIAGNOSIS — G4733 Obstructive sleep apnea (adult) (pediatric): Secondary | ICD-10-CM | POA: Diagnosis present

## 2019-12-22 DIAGNOSIS — I10 Essential (primary) hypertension: Secondary | ICD-10-CM

## 2019-12-22 DIAGNOSIS — E119 Type 2 diabetes mellitus without complications: Secondary | ICD-10-CM | POA: Insufficient documentation

## 2019-12-22 DIAGNOSIS — Z8249 Family history of ischemic heart disease and other diseases of the circulatory system: Secondary | ICD-10-CM | POA: Diagnosis not present

## 2019-12-22 DIAGNOSIS — J1282 Pneumonia due to coronavirus disease 2019: Secondary | ICD-10-CM | POA: Diagnosis present

## 2019-12-22 DIAGNOSIS — D509 Iron deficiency anemia, unspecified: Secondary | ICD-10-CM | POA: Diagnosis present

## 2019-12-22 DIAGNOSIS — Z809 Family history of malignant neoplasm, unspecified: Secondary | ICD-10-CM | POA: Diagnosis not present

## 2019-12-22 DIAGNOSIS — E785 Hyperlipidemia, unspecified: Secondary | ICD-10-CM | POA: Diagnosis present

## 2019-12-22 DIAGNOSIS — R0902 Hypoxemia: Secondary | ICD-10-CM | POA: Diagnosis present

## 2019-12-22 DIAGNOSIS — Z833 Family history of diabetes mellitus: Secondary | ICD-10-CM | POA: Diagnosis not present

## 2019-12-22 DIAGNOSIS — Z7984 Long term (current) use of oral hypoglycemic drugs: Secondary | ICD-10-CM | POA: Diagnosis not present

## 2019-12-22 LAB — PROCALCITONIN: Procalcitonin: <0.1 ng/mL

## 2019-12-22 LAB — CBC
HCT: 37.5 % (ref 36.0–46.0)
Hemoglobin: 11.6 g/dL — ABNORMAL LOW (ref 12.0–15.0)
MCH: 23.2 pg — ABNORMAL LOW (ref 26.0–34.0)
MCHC: 30.9 g/dL (ref 30.0–36.0)
MCV: 75.2 fL — ABNORMAL LOW (ref 80.0–100.0)
Platelets: 128 10*3/uL — ABNORMAL LOW (ref 150–400)
RBC: 4.99 MIL/uL (ref 3.87–5.11)
RDW: 13.9 % (ref 11.5–15.5)
WBC: 4.3 10*3/uL (ref 4.0–10.5)
nRBC: 0 % (ref 0.0–0.2)

## 2019-12-22 LAB — HEMOGLOBIN A1C
Hgb A1c MFr Bld: 6.4 % — ABNORMAL HIGH (ref 4.8–5.6)
Mean Plasma Glucose: 136.98 mg/dL

## 2019-12-22 LAB — CREATININE, SERUM
Creatinine, Ser: 0.71 mg/dL (ref 0.44–1.00)
GFR calc Af Amer: >60 mL/min (ref >60)
GFR calc non Af Amer: >60 mL/min (ref >60)

## 2019-12-22 LAB — BRAIN NATRIURETIC PEPTIDE: B Natriuretic Peptide: 11.3 pg/mL (ref 0.0–100.0)

## 2019-12-22 LAB — GLUCOSE, CAPILLARY
Glucose-Capillary: 258 mg/dL — ABNORMAL HIGH (ref 70–99)
Glucose-Capillary: 220 mg/dL — ABNORMAL HIGH (ref 70–99)
Glucose-Capillary: 136 mg/dL — ABNORMAL HIGH (ref 70–99)
Glucose-Capillary: 192 mg/dL — ABNORMAL HIGH (ref 70–99)

## 2019-12-22 LAB — HIV ANTIBODY (ROUTINE TESTING W REFLEX): HIV Screen 4th Generation wRfx: NONREACTIVE

## 2019-12-22 LAB — CBG MONITORING, ED: Glucose-Capillary: 173 mg/dL — ABNORMAL HIGH (ref 70–99)

## 2019-12-22 MED ORDER — INSULIN ASPART 100 UNIT/ML ~~LOC~~ SOLN: 0.0000 [IU] | Freq: Every day | SUBCUTANEOUS | Status: DC

## 2019-12-22 MED ORDER — INSULIN ASPART 100 UNIT/ML ~~LOC~~ SOLN
0.0000 [IU] | Freq: Three times a day (TID) | SUBCUTANEOUS | Status: DC
  Administered 2019-12-22: 5 [IU] via SUBCUTANEOUS
  Administered 2019-12-22: 1 [IU] via SUBCUTANEOUS
  Administered 2019-12-22 – 2019-12-23 (×2): 3 [IU] via SUBCUTANEOUS
  Administered 2019-12-23 (×2): 2 [IU] via SUBCUTANEOUS

## 2019-12-22 MED ORDER — SODIUM CHLORIDE 0.9 % IV SOLN
100.0000 mg | Freq: Every day | INTRAVENOUS | Status: DC
  Administered 2019-12-23: 100 mg via INTRAVENOUS
  Filled 2019-12-22: qty 20

## 2019-12-22 MED ORDER — ENOXAPARIN SODIUM 60 MG/0.6ML ~~LOC~~ SOLN
60.0000 mg | Freq: Every day | SUBCUTANEOUS | Status: DC
  Administered 2019-12-23: 60 mg via SUBCUTANEOUS
  Filled 2019-12-22: qty 0.6

## 2019-12-22 MED ORDER — ATORVASTATIN CALCIUM 40 MG PO TABS: 40.0000 mg | ORAL_TABLET | Freq: Every day | ORAL | Status: DC | PRN

## 2019-12-22 MED ORDER — FERROUS GLUCONATE 324 (38 FE) MG PO TABS
324.0000 mg | ORAL_TABLET | Freq: Every morning | ORAL | Status: DC
  Administered 2019-12-22 – 2019-12-23 (×2): 324 mg via ORAL
  Filled 2019-12-22 (×4): qty 1

## 2019-12-22 MED ORDER — ENOXAPARIN SODIUM 40 MG/0.4ML ~~LOC~~ SOLN
40.0000 mg | Freq: Every day | SUBCUTANEOUS | Status: DC
  Administered 2019-12-22: 40 mg via SUBCUTANEOUS
  Filled 2019-12-22: qty 0.4

## 2019-12-22 MED ORDER — SODIUM CHLORIDE 0.9 % IV SOLN
200.0000 mg | Freq: Once | INTRAVENOUS | Status: AC
  Administered 2019-12-22: 200 mg via INTRAVENOUS
  Filled 2019-12-22: qty 200

## 2019-12-22 MED ORDER — ADULT MULTIVITAMIN W/MINERALS CH
1.0000 | ORAL_TABLET | Freq: Every day | ORAL | Status: DC
  Administered 2019-12-22 – 2019-12-23 (×2): 1 via ORAL
  Filled 2019-12-22 (×2): qty 1

## 2019-12-22 MED ORDER — METOPROLOL TARTRATE 50 MG PO TABS: 50.0000 mg | ORAL_TABLET | Freq: Two times a day (BID) | ORAL | Status: DC

## 2019-12-22 MED ORDER — METOPROLOL TARTRATE 50 MG PO TABS
50.0000 mg | ORAL_TABLET | Freq: Two times a day (BID) | ORAL | Status: DC
  Administered 2019-12-22 – 2019-12-23 (×3): 50 mg via ORAL
  Filled 2019-12-22 (×3): qty 1

## 2019-12-22 MED ORDER — DEXAMETHASONE 6 MG PO TABS
6.0000 mg | ORAL_TABLET | Freq: Every day | ORAL | Status: DC
  Administered 2019-12-23: 6 mg via ORAL
  Filled 2019-12-22 (×2): qty 1

## 2019-12-22 MED ORDER — POLYETHYLENE GLYCOL 3350 17 G PO PACK: 17.0000 g | PACK | Freq: Every day | ORAL | Status: DC | PRN

## 2019-12-22 MED ORDER — ATORVASTATIN CALCIUM 40 MG PO TABS
40.0000 mg | ORAL_TABLET | Freq: Every day | ORAL | Status: DC
  Administered 2019-12-22 – 2019-12-23 (×2): 40 mg via ORAL
  Filled 2019-12-22 (×2): qty 1

## 2019-12-22 MED ORDER — GUAIFENESIN 100 MG/5ML PO SOLN
5.0000 mL | ORAL | Status: DC | PRN
  Administered 2019-12-22 – 2019-12-23 (×6): 100 mg via ORAL
  Filled 2019-12-22 (×6): qty 5

## 2019-12-22 MED ORDER — IRBESARTAN 300 MG PO TABS
300.0000 mg | ORAL_TABLET | Freq: Every day | ORAL | Status: DC
  Administered 2019-12-22 – 2019-12-23 (×2): 300 mg via ORAL
  Filled 2019-12-22 (×3): qty 1

## 2019-12-22 NOTE — Progress Notes (Signed)
Patient received from ED. Patient is alert and oriented. Iv in place. Patient given instructions about call bell and phone. Bed in low position and call bell in reach.

## 2019-12-22 NOTE — ED Notes (Signed)
Admitting paged to RN per her request 

## 2019-12-22 NOTE — Progress Notes (Signed)
[place] [id number] Admission Data: 12/22/2019 7:34 AM Attending Provider: Zenia Resides, MD  [id number], Damaris Hippo, PA-C Consults/ Treatment Team:   Carma Leaven Dunlevy is a 49 y.o. female patient admitted from ED awake, alert  & orientated  X 3,  Full Code, VSS - Blood pressure (!) 140/91, pulse 79, temperature 99 F (37.2 C), temperature source Oral, resp. rate 20, height 5\' 6"  (1.676 m), weight 119.3 kg, last menstrual period 04/03/2018, SpO2 98 %., O2  2 L nasal cannular, no c/o shortness of breath, no c/o chest pain, no distress noted. Tele # 1 placed and pt is currently running:normal sinus rhythm.   IV site WDL:  forearm left, condition patent and no redness with a transparent dsg that's clean dry and intact.  Allergies:   Allergies  Allergen Reactions  . Hydrocodone Nausea Only     Past Medical History:  Diagnosis Date  . Anemia   . Diabetes mellitus without complication ([place])   . Hypertension   . Obesity     Pt orientation to unit, room and routine. Information packet given to patient/family and safety video watched.  Admission INP armband ID verified with patient/family, and in place. SR up x 2, fall risk assessment complete with Patient and family verbalizing understanding of risks associated with falls. Pt verbalizes an understanding of how to use the call bell and to call for help before getting out of bed.  Skin, clean-dry- intact without evidence of bruising, or skin tears.   No evidence of skin break down noted on exam. no rashes    Will cont to monitor and assist as needed.  Hosie Spangle, [place] 12/22/2019 7:34 AM

## 2019-12-22 NOTE — H&P (Signed)
[facility] Admission History and Physical Service Pager: [phone]  Patient name: Grace Gallagher Medical record number: [id number] Date of birth: [birth year] Age: 49 y.o. Gender: female  Primary Care Provider: Mancel Bale, PA-C Consultants:  Code Status: full  Chief Complaint: shortness of breath  Assessment and Plan: Grace Gallagher is a 49 y.o. female presenting with shortness of breath . PMH is significant for HTN, obesity OSA, intermittent leg swelling/orthopnea, diabetes on Metformin  Covid positive with shortness of breath: Fevers for the last 3 to 4 days but otherwise felt well, shortness of breath started January 16 with cough.  No chest pain.  No sudden onset consistent with PE.  Desats to 90% when ambulating on room air in the emergency department, with 3 L at rest recovers to 95% oxygen.  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 -Admit to Covid floor MedSurg level, attending Dr. Andria Frames -O2 supplementation as needed to keep saturations above 94% -Encouraged to prone at least 2 to 3 hours/day -Starting remdesivir (1/17- ) -Starting dexamethasone (1/17- ) -Contact airborne precautions  Hypertension: Home olmesartan, metoprolol (patient says is for hypertension and not for cardiac reasons).  Initial presentation was hypertensive to [id number] systolic but has since stabilized to 130s to 150s.  Patient says she also takes furosemide as needed for intermittent leg swelling and orthopnea. -Substitute irbesartan due to formulary -Substitute Lopressor 50 twice daily for extended release 100 mg of Toprol -Can evaluate daily for need for furosemide or other medication  Diabetes-2: Last A1c April 2020 5.8, controlled by Metformin. -Sensitive sliding scale insulin -Carb modified diet -Recheck A1c  Intermittent swelling of legs and orthopnea: No  pitting edema during exam, patient reports no orthopnea today.  States she had negative echo last year but this was done at [place] and we are unable to see results.  Story is concerning for potential CHF -A.m. brain naturetic peptide -Can evaluate daily for potential of furosemide  OSA : Patient has had CPAP at home, -We will not be able to use CPAP due to being Covid positive  FEN/GI: Heart healthy carb modified Prophylaxis: Lovenox  Disposition: MedSurg Covid floor  History of Present Illness:  Grace Gallagher is a 49 y.o. female presenting with fevers for 3 to 4 days, shortness of breath and coughing for the last 24 hours.  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  No known specific exposure.  No nausea or vomiting although has had reduced appetite.  No chest pain, no urinary/bowel symptoms  Does have known history of hypertension and follows regularly with [facility] and has seen cardiology.  Most recent echo claimed was 2018, but results are not in her chart, patient says that she did not have a diagnosis of CHF despite being on metoprolol and as needed furosemide for intermittent orthopnea and bilateral edema  Review Of Systems: Per HPI with the following additions:   Review of Systems  Constitutional: Positive for chills, fever and malaise/fatigue. Negative for weight loss.  HENT: Negative.   Eyes: Negative.   Respiratory: Positive for cough and shortness of breath. Negative for hemoptysis, sputum production and wheezing.   Cardiovascular: Positive for leg swelling. Negative for chest pain and palpitations.  Gastrointestinal: Negative.   Genitourinary: Negative.   Musculoskeletal: Positive for myalgias.  Skin: Negative.  Neurological: Negative.   Psychiatric/Behavioral: Negative.     Patient Active Problem List   Diagnosis Date Noted  . COVID-19 virus detected 12/22/2019  . Leg swelling 04/26/2019  . Morbid obesity ([place])  04/26/2019  . Hyperlipidemia associated with type 2 diabetes mellitus ([place]) 10/25/2018  . OSA (obstructive sleep apnea) 10/24/2018  . AKI (acute kidney injury) ([place]) 07/25/2018  . Elevated bilirubin   . Gallstones   . Iron deficiency anemia 05/08/2018  . H/O abdominal hysterectomy 05/08/2018  . Fibroid 04/05/2018  . Family history of early CAD 04/12/2017  . Uncontrolled type 2 diabetes mellitus without complication, without long-term current use of insulin 04/12/2017  . Lower respiratory infection 07/07/2015  . Bacterial vaginosis 07/07/2015  . Edema 03/26/2015  . Anal itching 10/14/2014  . Head or neck swelling, mass, or lump 09/25/2014  . Vaginitis and vulvovaginitis 09/10/2014  . Dizziness and giddiness 06/02/2014  . Other malaise and fatigue 05/30/2014  . Acute sinusitis with symptoms > 10 days 04/09/2014  . Essential hypertension, malignant 02/04/2014  . Shortness of breath 02/04/2014  . Allergic rhinitis 01/29/2014  . Hypokalemia 01/10/2014  . Back pain 01/06/2014  . Bilateral lower abdominal cramping 08/29/2013  . Menorrhagia 08/29/2013  . Encounter for routine gynecological examination 07/01/2013  . Headache(784.0) 03/31/2013  . Essential hypertension 02/21/2013  . Morbid obesity with BMI of 40.0-44.9, adult ([place]) 02/21/2013  . Microcytic anemia 02/21/2013    Past Medical History: Past Medical History:  Diagnosis Date  . Anemia   . Diabetes mellitus without complication ([place])   . Hypertension   . Obesity     Past Surgical History: Past Surgical History:  Procedure Laterality Date  . ABDOMINAL HYSTERECTOMY    . BREAST SURGERY  08/2011   breast reduction  . CYSTOSCOPY  04/12/2018   Procedure: CYSTOSCOPY;  Surgeon: Gae Dry, MD;  Location: [facility] ORS;  Service: Gynecology;;  . FOOT SURGERY Left    cyst removed  . GALLBLADDER SURGERY  09/03/2018  . LAPAROSCOPIC HYSTERECTOMY Bilateral 04/12/2018   Procedure: HYSTERECTOMY TOTAL LAPAROSCOPIC BILATERAL  SALPINGECTOMY;  Surgeon: Gae Dry, MD;  Location: [facility] ORS;  Service: Gynecology;  Laterality: Bilateral;  . TUBAL LIGATION      Social History: Social History   Tobacco Use  . Smoking status: Never Smoker  . Smokeless tobacco: Never Used  Substance Use Topics  . Alcohol use: No  . Drug use: No   Additional social history: Please also refer to relevant sections of EMR.  Family History: Family History  Problem Relation Age of Onset  . Hypertension Mother   . Heart failure Mother   . Heart attack Mother 28  . Cancer Father   . Diabetes Brother   . Hypertension Brother   . Heart attack Brother 53    Allergies and Medications: Allergies  Allergen Reactions  . Hydrocodone Nausea Only   No current facility-administered medications on file prior to encounter.   Current Outpatient Medications on File Prior to Encounter  Medication Sig Dispense Refill  . atorvastatin (LIPITOR) 40 MG tablet Take 40 mg by mouth daily as needed (cholesterol).   5  . ferrous gluconate (FERGON) 324 MG tablet Take 324 mg by mouth every morning.    . furosemide (LASIX) 40 MG tablet Take 1 tablet (40 mg total) by mouth daily. NEEDS OFFICE VISIT FOR FURTHER REFILLS. 30 tablet 0  . metoprolol succinate (TOPROL-XL) 100 MG 24 hr tablet Take 100 mg by mouth daily.    Marland Kitchen olmesartan (BENICAR)  40 MG tablet Take 40 mg by mouth daily.    Marland Kitchen amLODipine (NORVASC) 5 MG tablet Take 1 tablet (5 mg total) by mouth daily. (Patient not taking: Reported on 12/21/2019) 90 tablet 1  . metFORMIN (GLUCOPHAGE) 1000 MG tablet Take 1,000 mg by mouth 2 (two) times daily with a meal.      Objective: BP (!) 134/92   Pulse 91   Temp 99.6 F (37.6 C) (Oral)   Resp 14   Ht 5\' 6"  (1.676 m)   Wt 119.3 kg   LMP 04/03/2018   SpO2 99%   BMI 42.45 kg/m  Exam: General: Uncomfortable appearing but speaking in full sentences Eyes: Clear sclera ENTM: No epistaxis, no congestion Neck: Soft Cardiovascular: Regular rate and  rhythm, no murmur noted to my exam Respiratory: Slightly decreased air movement throughout, cough on deep inspiration, mild increased work of breathing Gastrointestinal: Soft, no tenderness MSK: No pitting edema, no bony abnormalities noted Derm: No rash to exposed skin Neuro: No focal deficits Psych: A&O x3, answering questions appropriately  Labs and Imaging: CBC BMET  Recent Labs  Lab 12/21/19 2120  WBC 4.1  HGB 11.7*  HCT 37.2  PLT 144*   Recent Labs  Lab 12/21/19 2120  NA 135  K 3.2*  CL 101  CO2 23  BUN 8  CREATININE 0.76  GLUCOSE 106*  CALCIUM 8.3*     DG Chest Port 1 View  Result Date: 12/21/2019 CLINICAL DATA:  COVID-19 positive, hypoxic with ambulation and chills EXAM: PORTABLE CHEST 1 VIEW COMPARISON:  Radiograph 10/23/2018 FINDINGS: Bibasilar mixed interstitial and airspace opacity. No pneumothorax. No visible effusion the portion of the left costophrenic sulcus is collimated. The aorta is calcified. The remaining cardiomediastinal contours are unremarkable. No acute osseous or soft tissue abnormality. IMPRESSION: Bibasilar mixed interstitial and airspace opacities could reflect atypical infection/pneumonia in the setting of COVID-19 positivity. Electronically Signed   By: Lovena Le M.D.   On: 12/21/2019 20:54    Sherene Sires, DO 12/22/2019, 12:37 AM PGY-3, [place] Intern pager: [phone], text pages welcome

## 2019-12-22 NOTE — Progress Notes (Addendum)
Patient tolerated the prone position during the shift,  pt educated on the benefits of proning during the day. Will continue to assist pt on turning.

## 2019-12-23 LAB — COMPREHENSIVE METABOLIC PANEL
ALT: 31 U/L (ref 0–44)
AST: 26 U/L (ref 15–41)
Albumin: 3.3 g/dL — ABNORMAL LOW (ref 3.5–5.0)
Alkaline Phosphatase: 53 U/L (ref 38–126)
Anion gap: 11 (ref 5–15)
BUN: 11 mg/dL (ref 6–20)
CO2: 26 mmol/L (ref 22–32)
Calcium: 8.7 mg/dL — ABNORMAL LOW (ref 8.9–10.3)
Chloride: 103 mmol/L (ref 98–111)
Creatinine, Ser: 0.83 mg/dL (ref 0.44–1.00)
GFR calc Af Amer: >60 mL/min (ref >60)
GFR calc non Af Amer: >60 mL/min (ref >60)
Glucose, Bld: 152 mg/dL — ABNORMAL HIGH (ref 70–99)
Potassium: 3.7 mmol/L (ref 3.5–5.1)
Sodium: 140 mmol/L (ref 135–145)
Total Bilirubin: 0.8 mg/dL (ref 0.3–1.2)
Total Protein: 6.7 g/dL (ref 6.5–8.1)

## 2019-12-23 LAB — CBC WITH DIFFERENTIAL/PLATELET
Abs Immature Granulocytes: 0.01 10*3/uL (ref 0.00–0.07)
Basophils Absolute: 0 10*3/uL (ref 0.0–0.1)
Basophils Relative: 0 %
Eosinophils Absolute: 0 10*3/uL (ref 0.0–0.5)
Eosinophils Relative: 0 %
HCT: 39.4 % (ref 36.0–46.0)
Hemoglobin: 12.4 g/dL (ref 12.0–15.0)
Immature Granulocytes: 0 %
Lymphocytes Relative: 40 %
Lymphs Abs: 1.3 10*3/uL (ref 0.7–4.0)
MCH: 23.1 pg — ABNORMAL LOW (ref 26.0–34.0)
MCHC: 31.5 g/dL (ref 30.0–36.0)
MCV: 73.5 fL — ABNORMAL LOW (ref 80.0–100.0)
Monocytes Absolute: 0.3 10*3/uL (ref 0.1–1.0)
Monocytes Relative: 9 %
Neutro Abs: 1.7 10*3/uL (ref 1.7–7.7)
Neutrophils Relative %: 51 %
Platelets: 132 10*3/uL — ABNORMAL LOW (ref 150–400)
RBC: 5.36 MIL/uL — ABNORMAL HIGH (ref 3.87–5.11)
RDW: 13.6 % (ref 11.5–15.5)
WBC: 3.4 10*3/uL — ABNORMAL LOW (ref 4.0–10.5)
nRBC: 0 % (ref 0.0–0.2)

## 2019-12-23 LAB — ABO/RH: ABO/RH(D): A POS

## 2019-12-23 LAB — GLUCOSE, CAPILLARY
Glucose-Capillary: 166 mg/dL — ABNORMAL HIGH (ref 70–99)
Glucose-Capillary: 196 mg/dL — ABNORMAL HIGH (ref 70–99)
Glucose-Capillary: 251 mg/dL — ABNORMAL HIGH (ref 70–99)

## 2019-12-23 MED ORDER — DEXAMETHASONE 6 MG PO TABS: 6.0000 mg | ORAL_TABLET | Freq: Every day | ORAL | 0 refills | Status: AC

## 2019-12-23 MED ORDER — GUAIFENESIN 100 MG/5ML PO SOLN: 5.0000 mL | ORAL | 0 refills | Status: DC | PRN

## 2019-12-23 MED ORDER — ONDANSETRON 4 MG PO TBDP
4.0000 mg | ORAL_TABLET | Freq: Four times a day (QID) | ORAL | Status: DC | PRN
  Administered 2019-12-23: 4 mg via ORAL
  Filled 2019-12-23: qty 1

## 2019-12-23 MED ORDER — HYDROCERIN EX CREA
TOPICAL_CREAM | Freq: Three times a day (TID) | CUTANEOUS | Status: DC
  Filled 2019-12-23: qty 113

## 2019-12-23 MED ORDER — ACETAMINOPHEN 325 MG PO TABS
650.0000 mg | ORAL_TABLET | Freq: Four times a day (QID) | ORAL | Status: DC | PRN
  Administered 2019-12-23: 650 mg via ORAL
  Filled 2019-12-23: qty 2

## 2019-12-23 MED ORDER — AQUAPHOR EX OINT
TOPICAL_OINTMENT | Freq: Three times a day (TID) | CUTANEOUS | Status: DC
  Filled 2019-12-23: qty 50

## 2019-12-23 NOTE — Discharge Summary (Addendum)
[facility] Discharge Summary  Patient name: Grace Gallagher Medical record number: [id number] Date of birth: 1971-02-18 Age: 49 y.o. Gender: female Date of Admission: 12/21/2019  Date of Discharge: 12/23/2019 Admitting Physician: Sherene Sires, DO  Primary Care Provider: Mancel Bale, PA-C Consultants: None  Indication for Hospitalization: COVID-19 pneumonia  Discharge Diagnoses/Problem List:  Active Problems:   COVID-19 virus detected  Disposition: Home   Discharge Condition: Stable  Discharge Exam:   General: Alert and oriented in no apparent distress Heart: Regular rate and rhythm with no murmurs appreciated Lungs: CTA bilaterally, no wheezing, breathing well on room air Abdomen: Bowel sounds present, no abdominal pain Extremities: No lower extremity edema  Brief Hospital Course:   COVID-19 positive with shortness of breath: Patient admitted with shortness of breath with desats into the 90% range during ambulation, with new oxygen requirement of 3 L. COVID-19 positive, started on remdesivir and dexamethasone on 1/17.  Patient's respiratory status improved and on 1/18 she was breathing well on room air without any shortness of breath.  Social work was consulted to discuss the new Covid hospital at home program for this patient as she was doing well and getting close to the point of discharge.  This was 6 discussed with the patient who is also interested in being discharged.  Patient was ambulated on day of discharge without a need for O2.  Patient was discharged with plan to continue the total 5-day course of her remdesivir the infusion center in the outpatient setting as well as total 10-day course of Decadron.  Hypertension Throughout the hospitalization patient's systolic blood pressure ranged from the 130s-180s with patient on her home medications of metoprolol and irbesartan (substituted for olmesartan as not on formulary).  Issues for Follow Up:   1. Patient scheduled for outpatient remdesivir infusion at 5:30 PM on Tuesday, 1/19, Wednesday 1/20, and Thursday 1/21. 2. PCP recommend follow-up to monitor patient status as she recovers from COVID-19.  Significant Procedures: None  Significant Labs and Imaging:  Recent Labs  Lab 12/21/19 2120 12/22/19 0047 12/23/19 0333  WBC 4.1 4.3 3.4*  HGB 11.7* 11.6* 12.4  HCT 37.2 37.5 39.4  PLT 144* 128* 132*   Recent Labs  Lab 12/21/19 2120 12/22/19 0047 12/23/19 0333  NA 135  --  140  K 3.2*  --  3.7  CL 101  --  103  CO2 23  --  26  GLUCOSE 106*  --  152*  BUN 8  --  11  CREATININE 0.76 0.71 0.83  CALCIUM 8.3*  --  8.7*  ALKPHOS 58  --  53  AST 20  --  26  ALT 26  --  31  ALBUMIN 3.5  --  3.3*     Results/Tests Pending at Time of Discharge: None  Discharge Medications:  Allergies as of 12/23/2019      Reactions   Hydrocodone Nausea Only      Medication List    STOP taking these medications   amLODipine 5 MG tablet Commonly known as: NORVASC     TAKE these medications   atorvastatin 40 MG tablet Commonly known as: LIPITOR Take 40 mg by mouth daily as needed (cholesterol).   dexamethasone 6 MG tablet Commonly known as: DECADRON Take 1 tablet (6 mg total) by mouth daily for 8 days. Start taking on: December 24, 2019   ferrous gluconate 324 MG tablet Commonly known as: FERGON Take 324 mg by mouth every morning.  furosemide 40 MG tablet Commonly known as: LASIX Take 1 tablet (40 mg total) by mouth daily. NEEDS OFFICE VISIT FOR FURTHER REFILLS.   guaiFENesin 100 MG/5ML Soln Commonly known as: ROBITUSSIN Take 5 mLs (100 mg total) by mouth every 4 (four) hours as needed for cough or to loosen phlegm.   metFORMIN 1000 MG tablet Commonly known as: GLUCOPHAGE Take 1,000 mg by mouth 2 (two) times daily with a meal.   metoprolol succinate 100 MG 24 hr tablet Commonly known as: TOPROL-XL Take 100 mg by mouth daily.   olmesartan 40 MG tablet Commonly  known as: BENICAR Take 40 mg by mouth daily.       Discharge Instructions: Please refer to Patient Instructions section of EMR for full details.  Patient was counseled important signs and symptoms that should prompt return to medical care, changes in medications, dietary instructions, activity restrictions, and follow up appointments.   Follow-Up Appointments:   Lurline Del, DO 12/23/2019, 5:27 PM PGY-1, [place]

## 2019-12-23 NOTE — TOC Initial Note (Addendum)
Transition of Care [facility]) - Initial/Assessment Note    Patient Details  Name: Grace Gallagher MRN: [id number] Date of Birth: 13-Feb-1971  [facility]) CM/SW Contact:    Maryclare Labrador, RN Phone Number: 12/23/2019, 11:30 AM  Clinical Narrative:   PTA independent from home with husband.  Pt confirms she has a PCP and denied barriers with paying for discharge medications. Pt is COVID positive and on 2 liters [place].   Pt is in agreement for [facility] Program at home as ordered.  CM emailed referral to program liaisons.       CM confirmed with Gwinda Passe of Remote at Home program that pt has been accepted with the program - program aware that pt will discharge home today.  Per attending pt has been set up with the IV infusion clinic.  No other CM needs - discharge orders signed - CM signing off              Expected Discharge Plan: Home/Self Care Barriers to Discharge: Continued Medical Work up   Patient Goals and CMS Choice        Expected Discharge Plan and Services Expected Discharge Plan: Home/Self Care   Discharge Planning Services: Other - See comment(COVID at home program)   Living arrangements for the past 2 months: [place]                                      Prior Living Arrangements/Services Living arrangements for the past 2 months: Single Family Home Lives with:: Spouse Patient language and need for interpreter reviewed:: Yes Do you feel safe going back to the place where you live?: Yes      Need for Family Participation in Patient Care: Yes (Comment) Care giver support system in place?: Yes (comment)   Criminal Activity/Legal Involvement Pertinent to Current Situation/Hospitalization: No - Comment as needed  Activities of Daily Living      Permission Sought/Granted   Permission granted to share information with : Yes, Verbal Permission Granted     Permission granted to share info w AGENCY: Remote COVID at home prorgam         Emotional Assessment   Attitude/Demeanor/Rapport: Gracious, Charismatic, Self-Confident, Engaged Affect (typically observed): Accepting, Adaptable Orientation: : Oriented to Self, Oriented to Place, Oriented to  Time, Oriented to Situation   Psych Involvement: No (comment)  Admission diagnosis:  Hypoxia [R09.02] COVID-19 virus detected [U07.1] COVID-19 virus infection [U07.1] Patient Active Problem List   Diagnosis Date Noted  . COVID-19 virus detected 12/22/2019  . Diabetes mellitus ([place])   . Hypoxia   . Leg swelling 04/26/2019  . Morbid obesity ([place]) 04/26/2019  . Hyperlipidemia associated with type 2 diabetes mellitus ([place]) 10/25/2018  . OSA (obstructive sleep apnea) 10/24/2018  . AKI (acute kidney injury) ([place]) 07/25/2018  . Elevated bilirubin   . Gallstones   . Iron deficiency anemia 05/08/2018  . H/O abdominal hysterectomy 05/08/2018  . Fibroid 04/05/2018  . Family history of early CAD 04/12/2017  . Uncontrolled type 2 diabetes mellitus without complication, without long-term current use of insulin 04/12/2017  . Lower respiratory infection 07/07/2015  . Bacterial vaginosis 07/07/2015  . Edema 03/26/2015  . Anal itching 10/14/2014  . Head or neck swelling, mass, or lump 09/25/2014  . Vaginitis and vulvovaginitis 09/10/2014  . Dizziness and giddiness 06/02/2014  . Other malaise and fatigue 05/30/2014  . Acute  sinusitis with symptoms > 10 days 04/09/2014  . Essential hypertension, malignant 02/04/2014  . Shortness of breath 02/04/2014  . Allergic rhinitis 01/29/2014  . Hypokalemia 01/10/2014  . Back pain 01/06/2014  . Bilateral lower abdominal cramping 08/29/2013  . Menorrhagia 08/29/2013  . Encounter for routine gynecological examination 07/01/2013  . Headache(784.0) 03/31/2013  . Essential hypertension 02/21/2013  . Morbid obesity with BMI of 40.0-44.9, adult ([place]) 02/21/2013  . Microcytic anemia 02/21/2013   PCP:  Mancel Bale, PA-C Pharmacy:    [id number] - [place], [place] [address] Phone: [phone] Fax: [phone]     Social Determinants of Health (SDOH) Interventions    Readmission Risk Interventions No flowsheet data found.

## 2019-12-23 NOTE — Discharge Instructions (Addendum)
You are scheduled for an outpatient infusion of Remdesivir at 5:30 on Tuesday, 1/19, Wednesday, 1/20 and Thursday, 1/21. Please report to [facility] at [address]  Drive to the security guard and tell them you are here for an infusion. They will direct you to the front entrance where we will come and get you.  For questions call [phone].  Thanks    Thank you so much for allowing [place] to be apart of your care! You were admitted to [facility] for pneumonia from COVID-19 requiring additional oxygen. You received steroids and remedesivir, and will continue these at home.   In terms of COVID, do recommend a 20 day quarantine from onset of COVID symptoms/positive test result due to the severity of your illness requiring hospitalization with no fever >24 hours (without use of fever reducing medications) and major symptoms improving. Please isolate away from your family at home, recommend testing of your close contacts that were exposed to during your illness (and up to 48 hours prior symptom onset you were considered to be contagious). Stay well hydrated and may use cough medications/tylenol as needed. Complete the steroid course and IV remedesivir as prescribed.   Please make sure you follow up with your primary care provider. If you experience recurrence of shortness of breath, chest pain, confusion, or inability to maintain hydration--please seek medical care ASAP.    10 Things You Can Do to Manage Your COVID-19 Symptoms at Home If you have possible or confirmed COVID-19: 1. Stay home from work and school. And stay away from other public places. If you must go out, avoid using any kind of public transportation, ridesharing, or taxis. 2. Monitor your symptoms carefully. If your symptoms get worse, call your healthcare provider immediately. 3. Get rest and stay hydrated. 4. If you have a medical appointment, call the healthcare provider ahead of time and tell them that you have or  may have COVID-19. 5. For medical emergencies, call 911 and notify the dispatch personnel that you have or may have COVID-19. 6. Cover your cough and sneezes with a tissue or use the inside of your elbow. 7. Wash your hands often with soap and water for at least 20 seconds or clean your hands with an alcohol-based hand sanitizer that contains at least 60% alcohol. 8. As much as possible, stay in a specific room and away from other people in your home. Also, you should use a separate bathroom, if available. If you need to be around other people in or outside of the home, wear a mask. 9. Avoid sharing personal items with other people in your household, like dishes, towels, and bedding. 10. Clean all surfaces that are touched often, like counters, tabletops, and doorknobs. Use household cleaning sprays or wipes according to the label instructions. [email or web address] Document Revised: 11/07/2019 Document Reviewed: 11/07/2019 Elsevier Patient Education  [employer].   06/05/2019 This information is not intended to replace advice given to you by your health care provider. Make sure you discuss any questions you have with your health care provider.

## 2019-12-23 NOTE — Progress Notes (Signed)
Spoke to social work that the patient with regard to COVID-19 hospital at home program.  Per social work she was informed that the patient is accepted into this program and will be ready to discharge home as she does not need oxygen at this time.  We will plan to discharge patient today.

## 2019-12-23 NOTE — Progress Notes (Signed)
Patient scheduled for outpatient Remdesivir infusion at 5:30 on Tuesday, 1/19, Wednesday, 1/20 and Thursday, 1/21. Please advise them to report to [facility] at [address]  Drive to the security guard and tell them you are here for an infusion. They will direct you to the front entrance where we will come and get you.  For questions call [phone].  Thanks

## 2019-12-23 NOTE — Progress Notes (Signed)
Family Medicine Teaching Service Daily Progress Note Intern Pager: [phone]  Patient name: Grace Gallagher Medical record number: [id number] Date of birth: 03/30/1971 Age: 49 y.o. Gender: female  Primary Care Provider: Mancel Bale, PA-C Consultants: None Code Status: Full  Pt Overview and Major Events to Date:  1/17-admitted  Assessment and Plan: Grace Gallagher is a 49 y.o. female presenting with shortness of breath . PMH is significant for HTN, obesity OSA, intermittent leg swelling/orthopnea, diabetes on Metformin  COVID-19 positive shortness of breath: Patient with fevers past few days.  Patient afebrile at this time.  In the ED patient was desatted to 90% while ambulating on room air.  Currently on 2 L nasal cannula, satting 95%. -O2 supplementation as needed to keep sats above 94% -Encourage to prone at least 2 to 3 hours/day -Remdesivir day 2 of 5 (1/17-) -Dexamethasone (1/17-) -Airborne precautions  Hypertension All medications include metoprolol and olmesartan.  In the ED patient was hypertensive to the 99991111 systolic, currently [id number].  Patient also takes furosemide as needed for intermittent leg swelling and orthopnea. -Substitute irbesartan due to formulary -Substitute Lopressor 50 twice daily for extended release 100 mg of Toprol -Evaluate daily for the need for furosemide rhythm education  Diabetes type 2 Last A1c of April 2025.8.  All medication include Metformin -Sensitive sliding scale -Carb modified -Recheck A1c.  Intermittent leg swelling with orthopnea No pitting edema.  Patient states she had a negative echo last year but unfortunately this was done about we are unable to see the results. -Follow-up on BMP -Evaluate for daily needed furosemide  OSA Patient with CPAP at home -Not be able to use CPAP due to COVID-19 positive  FEN/GI: Heart healthy, PPx: Lovenox  Disposition: MedSurg, Covid floor  Subjective:  Patient currently breathing well on room  air, no complaints at this time, states she had a headache last night.  States that she did not use her nasal cannula through most of the night and did very well with this.  Objective: Temp:  [99 F (37.2 C)-99.7 F (37.6 C)] 99.5 F (37.5 C) (01/18 0455) Pulse Rate:  [75-92] 88 (01/18 0455) Resp:  [16-27] 21 (01/18 0455) BP: (132-161)/(85-121) 155/85 (01/18 0455) SpO2:  [94 %-98 %] 95 % (01/18 0455) Physical Exam: General: Alert and oriented in no apparent distress Heart: Regular rate and rhythm with no murmurs appreciated Lungs: CTA bilaterally, no wheezing, breathing well on room air. Abdomen: Bowel sounds present, no abdominal pain Extremities: No lower extremity edema  Laboratory: Recent Labs  Lab 12/21/19 2120 12/22/19 0047 12/23/19 0333  WBC 4.1 4.3 3.4*  HGB 11.7* 11.6* 12.4  HCT 37.2 37.5 39.4  PLT 144* 128* 132*   Recent Labs  Lab 12/21/19 2120 12/22/19 0047 12/23/19 0333  NA 135  --  140  K 3.2*  --  3.7  CL 101  --  103  CO2 23  --  26  BUN 8  --  11  CREATININE 0.76 0.71 0.83  CALCIUM 8.3*  --  8.7*  PROT 6.6  --  6.7  BILITOT 0.7  --  0.8  ALKPHOS 58  --  53  ALT 26  --  31  AST 20  --  26  GLUCOSE 106*  --  152*     Imaging/Diagnostic Tests: DG Chest Port 1 View  Result Date: 12/21/2019 CLINICAL DATA:  COVID-19 positive, hypoxic with ambulation and chills EXAM: PORTABLE CHEST 1 VIEW COMPARISON:  Radiograph 10/23/2018 FINDINGS: Bibasilar mixed  interstitial and airspace opacity. No pneumothorax. No visible effusion the portion of the left costophrenic sulcus is collimated. The aorta is calcified. The remaining cardiomediastinal contours are unremarkable. No acute osseous or soft tissue abnormality. IMPRESSION: Bibasilar mixed interstitial and airspace opacities could reflect atypical infection/pneumonia in the setting of COVID-19 positivity. Electronically Signed   By: Lovena Le M.D.   On: 12/21/2019 20:54     Lurline Del, DO 12/23/2019, 6:00  AM PGY-1, [place] Intern pager: [phone], text pages welcome

## 2019-12-23 NOTE — Progress Notes (Signed)
Patient discharge to home. Discharge instructions reviewed with patient, prescription for Decadron was sent to patient's pharmacy.

## 2019-12-23 NOTE — Progress Notes (Signed)
SATURATION QUALIFICATIONS: (This note is used to comply with regulatory documentation for home oxygen)  Patient Saturations on Room Air at Rest = 95%  Patient Saturations on Room Air while Ambulating = 91-92%   Please briefly explain why patient needs home oxygen:

## 2019-12-24 ENCOUNTER — Telehealth: Payer: Self-pay

## 2019-12-24 ENCOUNTER — Other Ambulatory Visit: Payer: Self-pay | Admitting: Family Medicine

## 2019-12-24 ENCOUNTER — Ambulatory Visit (HOSPITAL_COMMUNITY)
Admission: RE | Admit: 2019-12-24 | Discharge: 2019-12-24 | Disposition: A | Discharge status: Home / Self Care | Payer: Commercial Managed Care - PPO | Source: Ambulatory Visit | Attending: Pulmonary Disease | Admitting: Pulmonary Disease

## 2019-12-24 VITALS — BP 152/98 | HR 82 | Temp 99.2°F | Resp 21

## 2019-12-24 DIAGNOSIS — U071 COVID-19: Secondary | ICD-10-CM | POA: Diagnosis not present

## 2019-12-24 MED ORDER — SODIUM CHLORIDE 0.9 % IV SOLN
100.0000 mg | Freq: Once | INTRAVENOUS | Status: AC
  Administered 2019-12-24: 100 mg via INTRAVENOUS

## 2019-12-24 MED ORDER — SODIUM CHLORIDE 0.9 % IV SOLN: INTRAVENOUS | Status: DC | PRN

## 2019-12-24 MED ORDER — METHYLPREDNISOLONE SODIUM SUCC 125 MG IJ SOLR: 125.0000 mg | Freq: Once | INTRAMUSCULAR | Status: DC | PRN

## 2019-12-24 MED ORDER — EPINEPHRINE 0.3 MG/0.3ML IJ SOAJ: 0.3000 mg | Freq: Once | INTRAMUSCULAR | Status: DC | PRN

## 2019-12-24 MED ORDER — ALBUTEROL SULFATE HFA 108 (90 BASE) MCG/ACT IN AERS: 2.0000 | INHALATION_SPRAY | Freq: Once | RESPIRATORY_TRACT | Status: DC | PRN

## 2019-12-24 MED ORDER — SODIUM CHLORIDE 0.9 % IV SOLN
INTRAVENOUS | Status: AC
  Filled 2019-12-24: qty 20

## 2019-12-24 MED ORDER — FAMOTIDINE IN NACL 20-0.9 MG/50ML-% IV SOLN: 20.0000 mg | Freq: Once | INTRAVENOUS | Status: DC | PRN

## 2019-12-24 MED ORDER — DIPHENHYDRAMINE HCL 50 MG/ML IJ SOLN: 50.0000 mg | Freq: Once | INTRAMUSCULAR | Status: DC | PRN

## 2019-12-24 NOTE — Progress Notes (Signed)
  Diagnosis: COVID-19  Physician: Dr. Wright  Procedure: Covid Infusion Clinic Med: remdesivir infusion.  Complications: No immediate complications noted.  Discharge: Discharged home   Grace Gallagher R Latrish Mogel 12/24/2019   

## 2019-12-24 NOTE — Telephone Encounter (Signed)
Grace Gallagher, from [facility], calls nurse line to give a critical result. Patient has a positive blood culture that was drawn on 1/16 while on our inpatient service. Patient has since been discharger. I gave Grace Gallagher patients PCP information, as she is not a patient at [facility]. Will forward to [place] as well.

## 2019-12-24 NOTE — Discharge Instructions (Signed)
10 Things You Can Do to Manage Your COVID-19 Symptoms at Home If you have possible or confirmed COVID-19: 1. Stay home from work and school. And stay away from other public places. If you must go out, avoid using any kind of public transportation, ridesharing, or taxis. 2. Monitor your symptoms carefully. If your symptoms get worse, call your healthcare provider immediately. 3. Get rest and stay hydrated. 4. If you have a medical appointment, call the healthcare provider ahead of time and tell them that you have or may have COVID-19. 5. For medical emergencies, call 911 and notify the dispatch personnel that you have or may have COVID-19. 6. Cover your cough and sneezes with a tissue or use the inside of your elbow. 7. Wash your hands often with soap and water for at least 20 seconds or clean your hands with an alcohol-based hand sanitizer that contains at least 60% alcohol. 8. As much as possible, stay in a specific room and away from other people in your home. Also, you should use a separate bathroom, if available. If you need to be around other people in or outside of the home, wear a mask. 9. Avoid sharing personal items with other people in your household, like dishes, towels, and bedding. 10. Clean all surfaces that are touched often, like counters, tabletops, and doorknobs. Use household cleaning sprays or wipes according to the label instructions. cdc.gov/coronavirus 06/05/2019 This information is not intended to replace advice given to you by your health care provider. Make sure you discuss any questions you have with your health care provider. Document Revised: 11/07/2019 Document Reviewed: 11/07/2019 Elsevier Patient Education  2020 [employer] Inc[employer]  

## 2019-12-24 NOTE — Telephone Encounter (Signed)
Inpatient team is aware and following.

## 2019-12-25 ENCOUNTER — Ambulatory Visit (HOSPITAL_COMMUNITY)
Admit: 2019-12-25 | Discharge: 2019-12-25 | Disposition: A | Discharge status: Home / Self Care | Payer: Commercial Managed Care - PPO | Attending: Pulmonary Disease | Admitting: Pulmonary Disease

## 2019-12-25 DIAGNOSIS — U071 COVID-19: Secondary | ICD-10-CM | POA: Diagnosis not present

## 2019-12-25 LAB — CULTURE, BLOOD (ROUTINE X 2): Special Requests: ADEQUATE

## 2019-12-25 MED ORDER — ALBUTEROL SULFATE HFA 108 (90 BASE) MCG/ACT IN AERS: 2.0000 | INHALATION_SPRAY | Freq: Once | RESPIRATORY_TRACT | Status: DC | PRN

## 2019-12-25 MED ORDER — SODIUM CHLORIDE 0.9 % IV SOLN
INTRAVENOUS | Status: AC
  Administered 2019-12-25: 18:00:00 100 mg via INTRAVENOUS
  Filled 2019-12-25: qty 20

## 2019-12-25 MED ORDER — FAMOTIDINE IN NACL 20-0.9 MG/50ML-% IV SOLN: 20.0000 mg | Freq: Once | INTRAVENOUS | Status: DC | PRN

## 2019-12-25 MED ORDER — EPINEPHRINE 0.3 MG/0.3ML IJ SOAJ: 0.3000 mg | Freq: Once | INTRAMUSCULAR | Status: DC | PRN

## 2019-12-25 MED ORDER — SODIUM CHLORIDE 0.9 % IV SOLN: 100.0000 mg | Freq: Once | INTRAVENOUS | Status: AC

## 2019-12-25 MED ORDER — METHYLPREDNISOLONE SODIUM SUCC 125 MG IJ SOLR: 125.0000 mg | Freq: Once | INTRAMUSCULAR | Status: DC | PRN

## 2019-12-25 MED ORDER — DIPHENHYDRAMINE HCL 50 MG/ML IJ SOLN: 50.0000 mg | Freq: Once | INTRAMUSCULAR | Status: DC | PRN

## 2019-12-25 MED ORDER — SODIUM CHLORIDE 0.9 % IV SOLN: INTRAVENOUS | Status: DC | PRN

## 2019-12-25 NOTE — Progress Notes (Signed)
  Diagnosis: COVID-19  Physician: Damaris Hippo. Weber PA-C  Procedure: Covid Infusion Clinic Med: remdesivir infusion.  Complications: No immediate complications noted.  Discharge: Discharged home   Gaye Alken 12/25/2019

## 2019-12-26 ENCOUNTER — Ambulatory Visit (HOSPITAL_COMMUNITY)
Admit: 2019-12-26 | Discharge: 2019-12-26 | Disposition: A | Discharge status: Home / Self Care | Payer: Commercial Managed Care - PPO | Attending: Pulmonary Disease | Admitting: Pulmonary Disease

## 2019-12-26 DIAGNOSIS — U071 COVID-19: Secondary | ICD-10-CM | POA: Diagnosis not present

## 2019-12-26 LAB — CULTURE, BLOOD (ROUTINE X 2)
Culture: NO GROWTH
Special Requests: ADEQUATE

## 2019-12-26 MED ORDER — SODIUM CHLORIDE 0.9 % IV SOLN
INTRAVENOUS | Status: AC
  Administered 2019-12-26: 18:00:00 100 mg via INTRAVENOUS
  Filled 2019-12-26: qty 20

## 2019-12-26 MED ORDER — FAMOTIDINE IN NACL 20-0.9 MG/50ML-% IV SOLN: 20.0000 mg | Freq: Once | INTRAVENOUS | Status: DC | PRN

## 2019-12-26 MED ORDER — METHYLPREDNISOLONE SODIUM SUCC 125 MG IJ SOLR: 125.0000 mg | Freq: Once | INTRAMUSCULAR | Status: DC | PRN

## 2019-12-26 MED ORDER — ACETAMINOPHEN 325 MG PO TABS
ORAL_TABLET | ORAL | Status: AC
  Administered 2019-12-26: 18:00:00 650 mg
  Filled 2019-12-26: qty 2

## 2019-12-26 MED ORDER — ALBUTEROL SULFATE HFA 108 (90 BASE) MCG/ACT IN AERS: 2.0000 | INHALATION_SPRAY | Freq: Once | RESPIRATORY_TRACT | Status: DC | PRN

## 2019-12-26 MED ORDER — EPINEPHRINE 0.3 MG/0.3ML IJ SOAJ: 0.3000 mg | Freq: Once | INTRAMUSCULAR | Status: DC | PRN

## 2019-12-26 MED ORDER — SODIUM CHLORIDE 0.9 % IV SOLN: 100.0000 mg | Freq: Once | INTRAVENOUS | Status: AC

## 2019-12-26 MED ORDER — SODIUM CHLORIDE 0.9 % IV SOLN
INTRAVENOUS | Status: DC | PRN
  Administered 2019-12-26: 18:00:00 250 mL via INTRAVENOUS

## 2019-12-26 MED ORDER — DIPHENHYDRAMINE HCL 50 MG/ML IJ SOLN: 50.0000 mg | Freq: Once | INTRAMUSCULAR | Status: DC | PRN

## 2019-12-26 NOTE — Progress Notes (Signed)
  Diagnosis: COVID-19  Physician: Joya Gaskins  Procedure: Covid Infusion Clinic Med: remdesivir infusion.  Complications: No immediate complications noted.  Discharge: Discharged home   Baxter Hire 12/26/2019

## 2019-12-26 NOTE — Discharge Instructions (Signed)
10 Things You Can Do to Manage Your COVID-19 Symptoms at Home If you have possible or confirmed COVID-19: 1. Stay home from work and school. And stay away from other public places. If you must go out, avoid using any kind of public transportation, ridesharing, or taxis. 2. Monitor your symptoms carefully. If your symptoms get worse, call your healthcare provider immediately. 3. Get rest and stay hydrated. 4. If you have a medical appointment, call the healthcare provider ahead of time and tell them that you have or may have COVID-19. 5. For medical emergencies, call 911 and notify the dispatch personnel that you have or may have COVID-19. 6. Cover your cough and sneezes with a tissue or use the inside of your elbow. 7. Wash your hands often with soap and water for at least 20 seconds or clean your hands with an alcohol-based hand sanitizer that contains at least 60% alcohol. 8. As much as possible, stay in a specific room and away from other people in your home. Also, you should use a separate bathroom, if available. If you need to be around other people in or outside of the home, wear a mask. 9. Avoid sharing personal items with other people in your household, like dishes, towels, and bedding. 10. Clean all surfaces that are touched often, like counters, tabletops, and doorknobs. Use household cleaning sprays or wipes according to the label instructions. cdc.gov/coronavirus 06/05/2019 This information is not intended to replace advice given to you by your health care provider. Make sure you discuss any questions you have with your health care provider. Document Revised: 11/07/2019 Document Reviewed: 11/07/2019 Elsevier Patient Education  2020 [employer] Inc[employer]  

## 2019-12-28 ENCOUNTER — Other Ambulatory Visit: Payer: Self-pay | Admitting: Internal Medicine

## 2019-12-30 NOTE — Telephone Encounter (Signed)
Please schedule overdue appointment with Dr. Saunders Revel. Thank you!

## 2020-01-01 NOTE — Progress Notes (Signed)
Virtual Visit via Telephone Note   This visit type was conducted due to national recommendations for restrictions regarding the COVID-19 Pandemic (e.g. social distancing) in an effort to limit this patient's exposure and mitigate transmission in our community.  Due to her co-morbid illnesses, this patient is at least at moderate risk for complications without adequate follow up.  This format is felt to be most appropriate for this patient at this time.  The patient did not have access to video technology/had technical difficulties with video requiring transitioning to audio format only (telephone).  All issues noted in this document were discussed and addressed.  No physical exam could be performed with this format.  Please refer to the patient's chart for her  consent to telehealth for [facility].   Date:  01/03/2020   ID:  Grace Gallagher, DOB 1971/11/30, MRN [id number]  Patient Location: Home Provider Location: Office  PCP:  Mancel Bale, PA-C  Cardiologist:  Nelva Bush, MD  Electrophysiologist:  None   Evaluation Performed:  Follow-Up Visit  Chief Complaint:  Elevated blood pressure, blood glucose  History of Present Illness:    Grace Gallagher is a 49 y.o. female with history of HTN, HLD, DM2, obesity. She works as a [employer] manager.   Previously evaluated by Dr. Fletcher Anon in 2015 for hypertension and by Dr. Tommi Rumps of [facility] cardiology in 2018.  Prior, 2015 notable for normal renal artery Doppler and aldosterone/renal ratio.  Stress echo through [facility] 2018 was performed but results are unavailable.  She was seen in consult by Dr. And 04/26/2019 after establishing with a new PCP and had an ED visit for hypertensive urgency.  She had a diagnosis of sleep apnea in 2017 and purchased CPAP device but misplaced and had not used.  At her last office visit her blood pressure was noted be well controlled after weight loss and dietary changes.  Her amlodipine was decreased to 5 mg daily secondary  to lower extremity edema.  She was encouraged to follow-up regarding a CPAP device.  Seen by her PCP 11/28/2019 -referral was placed for split-night sleep study.  Via telemedicine 1/14/1.  Her Toprol was increased to 100 mg daily due to elevated blood pressure.  She was admitted 12/21/2019-12/23/2019 due to COVID-19.  She was treated with remdesivir and dexamethasone.  Her treatment course of remdesivir and Decadron were completed at the outpatient infusion clinic.   The patient does have symptoms concerning for COVID-19 infection (fatigue, shortness of breath). She was diagnosed 12/21/19 and had hospitalization. She has completed her infusions and reports feeling better every day.   SBP during admission 130s-180s. She has been checking at home and it is routinely 170s/90s on her home arm cuff. Her blood glucose has been elevated in the 200s since discharge.  She discussed with her primary care yesterday who suggested continuing to walk as elevated blood sugars more likely due to her present illness.  She had questions regarding her low potassium the hospital.  It was 3.2 on admission.  On review she has had hypokalemia in the past.  We discussed that this could be due to her Lasix which she takes daily.  We discussed that we may need to consider a regular potassium supplement and at her next in office visit we will check edema.  Past Medical History:  Diagnosis Date  . Anemia   . Diabetes mellitus without complication ([place])   . Hypertension   . Obesity    Past Surgical History:  Procedure Laterality Date  . ABDOMINAL HYSTERECTOMY    . BREAST SURGERY  08/2011   breast reduction  . CYSTOSCOPY  04/12/2018   Procedure: CYSTOSCOPY;  Surgeon: Gae Dry, MD;  Location: [facility] ORS;  Service: Gynecology;;  . FOOT SURGERY Left    cyst removed  . GALLBLADDER SURGERY  09/03/2018  . LAPAROSCOPIC HYSTERECTOMY Bilateral 04/12/2018   Procedure: HYSTERECTOMY TOTAL LAPAROSCOPIC BILATERAL SALPINGECTOMY;   Surgeon: Gae Dry, MD;  Location: [facility] ORS;  Service: Gynecology;  Laterality: Bilateral;  . TUBAL LIGATION       Current Meds  Medication Sig  . albuterol (ACCUNEB) 1.25 MG/3ML nebulizer solution Take by nebulization every 6 (six) hours as needed.  . Ascorbic Acid (VITAMIN C) 1000 MG tablet Take 1,000 mg by mouth daily.  Marland Kitchen aspirin 325 MG tablet Take 325 mg by mouth daily.  . Famotidine (PEPCID PO) Take by mouth daily.  . ferrous gluconate (FERGON) 324 MG tablet Take 324 mg by mouth every morning.  . furosemide (LASIX) 40 MG tablet Take 1 tablet (40 mg total) by mouth daily. (Patient taking differently: Take 40 mg by mouth as needed. )  . guaiFENesin (ROBITUSSIN) 100 MG/5ML SOLN Take 5 mLs (100 mg total) by mouth every 4 (four) hours as needed for cough or to loosen phlegm.  . metFORMIN (GLUCOPHAGE) 1000 MG tablet Take 1,000 mg by mouth 2 (two) times daily with a meal.  . Multiple Vitamins-Minerals (ZINC PO) Take by mouth daily.  Marland Kitchen olmesartan (BENICAR) 40 MG tablet Take 40 mg by mouth daily.  Marland Kitchen VITAMIN D PO Take by mouth daily.  . [DISCONTINUED] metoprolol succinate (TOPROL-XL) 100 MG 24 hr tablet Take 100 mg by mouth daily.     Allergies:   Hydrocodone   Social History   Tobacco Use  . Smoking status: Never Smoker  . Smokeless tobacco: Never Used  Substance Use Topics  . Alcohol use: No  . Drug use: No     Family Hx: The patient's family history includes Cancer in her father; Diabetes in her brother; Heart attack (age of onset: 75) in her mother; Heart attack (age of onset: 76) in her brother; Heart failure in her mother; Hypertension in her brother and mother.  ROS:   Please see the history of present illness.    Review of Systems  Constitution: Positive for malaise/fatigue. Negative for chills and fever.  Cardiovascular: Negative for chest pain, dyspnea on exertion, leg swelling, near-syncope, orthopnea, palpitations and syncope.  Respiratory: Positive for shortness  of breath. Negative for cough and wheezing.   Gastrointestinal: Negative for nausea and vomiting.  Neurological: Negative for dizziness, light-headedness and weakness.   All other systems reviewed and are negative.  Labs/Other Tests and Data Reviewed:    EKG:  An ECG dated 12/21/19 was personally reviewed today and demonstrated:  SR 95 bpm with no acute ST/T wave changes  Recent Labs: 12/22/2019: B Natriuretic Peptide 11.3 12/23/2019: ALT 31; BUN 11; Creatinine, Ser 0.83; Hemoglobin 12.4; Platelets 132; Potassium 3.7; Sodium 140   Recent Lipid Panel Lab Results  Component Value Date/Time   CHOL 166 07/01/2013 03:58 PM   TRIG 70 12/21/2019 09:20 PM   HDL 53.00 07/01/2013 03:58 PM   CHOLHDL 3 07/01/2013 03:58 PM   LDLCALC 93 07/01/2013 03:58 PM    Wt Readings from Last 3 Encounters:  01/03/20 263 lb (119.3 kg)  12/21/19 263 lb (119.3 kg)  04/26/19 245 lb (111.1 kg)     Objective:  Vital Signs:  Ht 5\' 6"  (1.676 m)   Wt 263 lb (119.3 kg)   LMP 04/03/2018   BMI 42.45 kg/m    VITAL SIGNS:  reviewed  ASSESSMENT & PLAN:    1. HTN - BP elevated during recent hospitalization SBP 130-180. BP at home on arm cuff recently 170s/90s. Hx intolerance to Amlodipine with LE edema. Continue Olmesartan 40mg  daily, Lasix 20mg  daily. Hesitant to utilize HCTZ or Chlorthalidone in setting of hypokalemia. STOP Toprol XL 100mg  daily. Start Carvedilol 25mg  BID. Recommend low sodium diet. Recommend regular cardiovascular exercise.  . 2. Hypokalemia - Noted during recent hospitalization. Likely secondary to Lasix which she takes daily. Will plan to check BMET at next office visit to help determine whether she needs daily potassium supplementation.   3. COVID 19 infection - Diagnosed 12/21/19. Hospitalized and completed remainder of infusions at outpatient clinic. Following isolation guidelines. Reports feeling a bit better each day. Likely etiology of some of her elevated blood pressure, blood glucose.  However, reports BP was elevated even prior to illness. Antihypertensive changes, as above. Will continue to monitor closely as she continues to recuperate and can consider down titration at future office visit if needed.   4. Lower extremity edema - Well controlled on Lasix 40mg  daily.  Recommend compression stockings, low sodium diet, elevating lower extremities.   5. Obesity - Continued weight loss encouraged via diet and exercise.   6. DM2 - Follows with PCP. Recently elevated CBGs likely secondary to acute illness. She will continue to monitor and follow up with her PCP.   7. HLD - LDL goal <100. LDL 11/08/19 was 76. Continue Atorvastatin 40mg  daily. Refill provided today.  8. OSA - Sleep study with recommendation for CPAP. Encourage compliance with CPAP.  COVID-19 Education: The signs and symptoms of COVID-19 were discussed with the patient and how to seek care for testing (follow up with PCP or arrange E-visit).  The importance of social distancing was discussed today.  Time:   Today, I have spent 15 minutes with the patient with telehealth technology discussing the above problems.     Medication Adjustments/Labs and Tests Ordered: Current medicines are reviewed at length with the patient today.  Concerns regarding medicines are outlined above.   Tests Ordered: No orders of the defined types were placed in this encounter.   Medication Changes: Meds ordered this encounter  Medications  . atorvastatin (LIPITOR) 40 MG tablet    Sig: Take 1 tablet (40 mg total) by mouth daily as needed (cholesterol).    Dispense:  30 tablet    Refill:  5  . carvedilol (COREG) 25 MG tablet    Sig: Take 1 tablet (25 mg total) by mouth 2 (two) times daily.    Dispense:  60 tablet    Refill:  2    Order Specific Question:   Supervising Provider    Answer:   Richardo Priest [id number]    Follow Up:  In Person in 2-3 week(s) for follow up of blood pressure. BMET at office visit.  Signed, Loel Dubonnet, NP  01/03/2020 9:46 AM    [place]

## 2020-01-03 ENCOUNTER — Telehealth (INDEPENDENT_AMBULATORY_CARE_PROVIDER_SITE_OTHER): Payer: Commercial Managed Care - PPO | Admitting: Family

## 2020-01-03 ENCOUNTER — Encounter: Payer: Self-pay | Admitting: Family

## 2020-01-03 ENCOUNTER — Other Ambulatory Visit: Payer: Self-pay

## 2020-01-03 VITALS — Ht 66.0 in | Wt 263.0 lb

## 2020-01-03 DIAGNOSIS — E118 Type 2 diabetes mellitus with unspecified complications: Secondary | ICD-10-CM

## 2020-01-03 DIAGNOSIS — Z794 Long term (current) use of insulin: Secondary | ICD-10-CM

## 2020-01-03 DIAGNOSIS — Z8616 Personal history of COVID-19: Secondary | ICD-10-CM

## 2020-01-03 DIAGNOSIS — R600 Localized edema: Secondary | ICD-10-CM | POA: Insufficient documentation

## 2020-01-03 DIAGNOSIS — I10 Essential (primary) hypertension: Secondary | ICD-10-CM | POA: Diagnosis not present

## 2020-01-03 DIAGNOSIS — E876 Hypokalemia: Secondary | ICD-10-CM | POA: Diagnosis not present

## 2020-01-03 DIAGNOSIS — G4733 Obstructive sleep apnea (adult) (pediatric): Secondary | ICD-10-CM

## 2020-01-03 DIAGNOSIS — E782 Mixed hyperlipidemia: Secondary | ICD-10-CM | POA: Insufficient documentation

## 2020-01-03 DIAGNOSIS — U071 COVID-19: Secondary | ICD-10-CM

## 2020-01-03 MED ORDER — ATORVASTATIN CALCIUM 40 MG PO TABS: 40.0000 mg | ORAL_TABLET | Freq: Every day | ORAL | 5 refills | Status: DC | PRN

## 2020-01-03 MED ORDER — CARVEDILOL 25 MG PO TABS: 25.0000 mg | ORAL_TABLET | Freq: Two times a day (BID) | ORAL | 2 refills | Status: AC

## 2020-01-03 NOTE — Patient Instructions (Addendum)
Medication Instructions:  Your physician has recommended you make the following change in your medication:   STOP Metoprolol  START Carvedilol 25mg  (one tablet) twice daily. This was sent to your pharmacy.   A refill of your Atorvastatin was sent to your pharmacy.   *If you need a refill on your cardiac medications before your next appointment, please call your pharmacy*  Lab Work: None ordered today. Would recommend having a BMET (basic metabolic panel) done at your next office visit with [place] or with your primary care provider to keep an eye on your potassium.   Testing/Procedures: None ordered today.  Follow-Up: At [facility], you and your health needs are our priority.  As part of our continuing mission to provide you with exceptional heart care, we have created designated Provider Care Teams.  These Care Teams include your primary Cardiologist (physician) and Advanced Practice Providers (APPs -  Physician Assistants and Nurse Practitioners) who all work together to provide you with the care you need, when you need it.  Your next appointment:   2-3 week(s)  The format for your next appointment:   In Person  Provider:    You may see No primary care provider on file. or one of the following Advanced Practice Providers on your designated Care Team:    Murray Hodgkins, NP  Christell Faith, PA-C  Marrianne Mood, PA-C   Other Instructions  It was a pleasure to speak with you today! Glad you are feeling better and hope you continue to feel better. Your systolic blood pressure in the hospital was 130-180. It continues to be elevated at home so we will change your medications, as listed above. Carvedilol is still a beta blocker (like Metoprolol) but is more effective at lowering blood pressure.  Your blood sugar is likely elevated due to recent infection and continuing to heal. Your Hemoglobin A1c while in the hospital was 6.4. This is at the high end of the pre-diabetes range. It  was 5.9 on 11/08/2019. Continue to keep up the good work with diet and exercise and follow with your primary care provider.  Your potassium was likely low at the hospital due to your Lasix. Diuretics can cause low levels of potassium as we get rid of potassium in our urine. Your potassium has been low in the past, but was good at discharge. We will continue to monitor and consider a potassium supplement if needed.   Carvedilol Tablets What is this medicine? CARVEDILOL (KAR ve dil ol) is a beta blocker. It decreases the amount of work your heart has to do and helps your heart beat regularly. It treats high blood pressure. This medicine may be used for other purposes; ask your health care provider or pharmacist if you have questions. COMMON BRAND NAME(S): Coreg What should I tell my health care provider before I take this medicine? They need to know if you have any of these conditions:  circulation problems  diabetes  history of heart attack or heart disease  liver disease  lung or breathing disease, like asthma or emphysema  pheochromocytoma  slow or irregular heartbeat  thyroid disease  an unusual or allergic reaction to carvedilol, other beta-blockers, medicines, foods, dyes, or preservatives  pregnant or trying to get pregnant  breast-feeding How should I use this medicine? Take this drug by mouth. Take it as directed on the prescription label at the same time every day. Take it with food. Keep taking it unless your health care provider tells you to  stop. Talk to your health care provider about the use of this drug in children. Special care may be needed. Overdosage: If you think you have taken too much of this medicine contact a poison control center or emergency room at once. NOTE: This medicine is only for you. Do not share this medicine with others. What if I miss a dose? If you miss a dose, take it as soon as you can. If it is almost time for your next dose, take only  that dose. Do not take double or extra doses. What may interact with this medicine? This medicine may interact with the following medications:  certain medicines for blood pressure, heart disease, irregular heart beat  certain medicines for depression, like fluoxetine or paroxetine  certain medicines for diabetes, like glipizide or glyburide  cimetidine  clonidine  cyclosporine  digoxin  MAOIs like Carbex, Eldepryl, Marplan, Nardil, and Parnate  reserpine  rifampin This list may not describe all possible interactions. Give your health care provider a list of all the medicines, herbs, non-prescription drugs, or dietary supplements you use. Also tell them if you smoke, drink alcohol, or use illegal drugs. Some items may interact with your medicine. What should I watch for while using this medicine? Check your heart rate and blood pressure regularly while you are taking this medicine. Ask your doctor or health care professional what your heart rate and blood pressure should be, and when you should contact him or her. Do not stop taking this medicine suddenly. This could lead to serious heart-related effects. Contact your doctor or health care professional if you have difficulty breathing while taking this drug. Check your weight daily. Ask your doctor or health care professional when you should notify him/her of any weight gain. You may get drowsy or dizzy. Do not drive, use machinery, or do anything that requires mental alertness until you know how this medicine affects you. To reduce the risk of dizzy or fainting spells, do not sit or stand up quickly. Alcohol can make you more drowsy, and increase flushing and rapid heartbeats. Avoid alcoholic drinks. This medicine may increase blood sugar. Ask your healthcare provider if changes in diet or medicines are needed if you have diabetes. If you are going to have surgery, tell your doctor or health care professional that you are taking this  medicine. What side effects may I notice from receiving this medicine? Side effects that you should report to your doctor or health care professional as soon as possible:  allergic reactions like skin rash, itching or hives, swelling of the face, lips, or tongue  breathing problems  dark urine  irregular heartbeat   signs and symptoms of high blood sugar such as being more thirsty or hungry or having to urinate more than normal. You may also feel very tired or have blurry vision.  swollen legs or ankles  vomiting  yellowing of the eyes or skin Side effects that usually do not require medical attention (report to your doctor or health care professional if they continue or are bothersome):  change in sex drive or performance  diarrhea  dry eyes (especially if wearing contact lenses)  dry, itching skin  headache  nausea  unusually tired This list may not describe all possible side effects. Call your doctor for medical advice about side effects. You may report side effects to FDA at 1-800-FDA-1088. Where should I keep my medicine? Keep out of the reach of children and pets. Store at room temperature between 20  and 25 degrees C (68 and 77 degrees F). Protect from moisture. Keep the container tightly closed. Throw away any unused drug after the expiration date. NOTE: This sheet is a summary. It may not cover all possible information. If you have questions about this medicine, talk to your doctor, pharmacist, or health care provider.  2020 Elsevier/Gold Standard (2019-06-28 17:42:09)

## 2020-01-17 ENCOUNTER — Ambulatory Visit: Payer: Commercial Managed Care - PPO | Admitting: Family

## 2020-01-17 DIAGNOSIS — Z8616 Personal history of COVID-19: Secondary | ICD-10-CM | POA: Insufficient documentation

## 2020-01-17 NOTE — Progress Notes (Deleted)
Office Visit    Patient Name: Grace Gallagher Date of Encounter: 01/17/2020  Primary Care Provider:  Mancel Bale, PA-C Primary Cardiologist:  Nelva Bush, MD Electrophysiologist:  None   Chief Complaint    Grace Gallagher is a 49 y.o. female with a hx of HTN, HLD, Dm2, obesity presents today for follow up of HTN and medication management.   Past Medical History    Past Medical History:  Diagnosis Date  . Anemia   . Diabetes mellitus without complication ([place])   . Hypertension   . Obesity    Past Surgical History:  Procedure Laterality Date  . ABDOMINAL HYSTERECTOMY    . BREAST SURGERY  08/2011   breast reduction  . CYSTOSCOPY  04/12/2018   Procedure: CYSTOSCOPY;  Surgeon: Gae Dry, MD;  Location: [facility] ORS;  Service: Gynecology;;  . FOOT SURGERY Left    cyst removed  . GALLBLADDER SURGERY  09/03/2018  . LAPAROSCOPIC HYSTERECTOMY Bilateral 04/12/2018   Procedure: HYSTERECTOMY TOTAL LAPAROSCOPIC BILATERAL SALPINGECTOMY;  Surgeon: Gae Dry, MD;  Location: [facility] ORS;  Service: Gynecology;  Laterality: Bilateral;  . TUBAL LIGATION      Allergies  Allergies  Allergen Reactions  . Hydrocodone Nausea Only    History of Present Illness    Grace Gallagher is a 49 y.o. female with a hx of *** last seen ***.  Previously evaluated by Dr. Fletcher Anon in 2015 for hypertension and by Dr. Tommi Rumps of [facility] cardiology in 2018.  Prior, 2015 notable for normal renal artery Doppler and aldosterone/renal ratio.  Stress echo through [facility] 2018 was performed but results are unavailable.  She was seen in consult by Dr. And 04/26/2019 after establishing with a new PCP and had an ED visit for hypertensive urgency.   She had a diagnosis of sleep apnea in 2017 and purchased CPAP device but misplaced and had not used. Seen by her PCP 11/28/2019 -referral was placed for split-night sleep study.  Via telemedicine 1/14/1.  Her Toprol was increased to 100 mg daily due to elevated blood  pressure.   She was admitted 12/21/2019-12/23/2019 due to COVID-19.  She was treated with remdesivir and dexamethasone.  Her treatment course of remdesivir and Decadron were completed at the outpatient infusion clinic. SBP during admission 130s-180s.   Seen via telemedicine 01/03/20. Reports home BP routinely 170s/90s on home arm cuff. Hypokalemia noted during hospitalization was discussed - likely due to her daily Lasix. At that virtual visit her Toprol XL was transitioned to Coreg 25mg  BID.   ***  EKGs/Labs/Other Studies Reviewed:   The following studies were reviewed today: ***  EKG:  EKG is ordered today.  The ekg ordered today demonstrates ***  Recent Labs: 12/22/2019: B Natriuretic Peptide 11.3 12/23/2019: ALT 31; BUN 11; Creatinine, Ser 0.83; Hemoglobin 12.4; Platelets 132; Potassium 3.7; Sodium 140  Recent Lipid Panel    Component Value Date/Time   CHOL 166 07/01/2013 1558   TRIG 70 12/21/2019 2120   HDL 53.00 07/01/2013 1558   CHOLHDL 3 07/01/2013 1558   VLDL 19.6 07/01/2013 1558   LDLCALC 93 07/01/2013 1558    Home Medications   No outpatient medications have been marked as taking for the 01/17/20 encounter (Appointment) with Loel Dubonnet, NP.      Review of Systems    ***   ROS All other systems reviewed and are otherwise negative except as noted above.  Physical Exam    VS:  LMP 04/03/2018  ,  BMI There is no height or weight on file to calculate BMI. GEN: Well nourished, well developed, in no acute distress. HEENT: normal. Neck: Supple, no JVD, carotid bruits, or masses. Cardiac: ***RRR, no murmurs, rubs, or gallops. No clubbing, cyanosis, edema.  ***Radials/DP/PT 2+ and equal bilaterally.  Respiratory:  ***Respirations regular and unlabored, clear to auscultation bilaterally. GI: Soft, nontender, nondistended, BS + x 4. MS: No deformity or atrophy. Skin: Warm and dry, no rash. Neuro:  Strength and sensation are intact. Psych: Normal  affect.  Accessory Clinical Findings    ECG personally reviewed by me today - *** - no acute changes.  Assessment & Plan    1. ***  Disposition: Follow up {follow up:15908} with ***   Loel Dubonnet, NP 01/17/2020, 1:06 PM

## 2020-01-23 ENCOUNTER — Telehealth (INDEPENDENT_AMBULATORY_CARE_PROVIDER_SITE_OTHER): Payer: Commercial Managed Care - PPO | Admitting: Family

## 2020-01-23 ENCOUNTER — Encounter: Payer: Self-pay | Admitting: Family

## 2020-01-23 ENCOUNTER — Other Ambulatory Visit: Payer: Self-pay

## 2020-01-23 VITALS — BP 161/110 | HR 80 | Ht 66.0 in | Wt 267.0 lb

## 2020-01-23 DIAGNOSIS — R600 Localized edema: Secondary | ICD-10-CM | POA: Diagnosis not present

## 2020-01-23 DIAGNOSIS — E876 Hypokalemia: Secondary | ICD-10-CM | POA: Diagnosis not present

## 2020-01-23 DIAGNOSIS — U071 COVID-19: Secondary | ICD-10-CM

## 2020-01-23 DIAGNOSIS — G4733 Obstructive sleep apnea (adult) (pediatric): Secondary | ICD-10-CM

## 2020-01-23 DIAGNOSIS — I10 Essential (primary) hypertension: Secondary | ICD-10-CM

## 2020-01-23 DIAGNOSIS — E118 Type 2 diabetes mellitus with unspecified complications: Secondary | ICD-10-CM

## 2020-01-23 DIAGNOSIS — Z8616 Personal history of COVID-19: Secondary | ICD-10-CM

## 2020-01-23 DIAGNOSIS — Z79899 Other long term (current) drug therapy: Secondary | ICD-10-CM

## 2020-01-23 DIAGNOSIS — Z794 Long term (current) use of insulin: Secondary | ICD-10-CM

## 2020-01-23 MED ORDER — HYDROCHLOROTHIAZIDE 25 MG PO TABS: 25.0000 mg | ORAL_TABLET | Freq: Every day | ORAL | 0 refills | Status: DC

## 2020-01-23 NOTE — Progress Notes (Signed)
Virtual Visit via Telephone Note   This visit type was conducted due to national recommendations for restrictions regarding the COVID-19 Pandemic (e.g. social distancing) in an effort to limit this patient's exposure and mitigate transmission in our community.  Due to her co-morbid illnesses, this patient is at least at moderate risk for complications without adequate follow up.  This format is felt to be most appropriate for this patient at this time.  The patient did not have access to video technology/had technical difficulties with video requiring transitioning to audio format only (telephone).  All issues noted in this document were discussed and addressed.  No physical exam could be performed with this format.  Please refer to the patient's chart for her  consent to telehealth for [facility] [place].   Date:  01/23/2020   ID:  Grace Gallagher, DOB 1971/04/08, MRN [id number]  Patient Location: Other:  Work Provider Location: Home  PCP:  Mancel Bale, PA-C  Cardiologist:  Nelva Bush, MD  Electrophysiologist:  None   Evaluation Performed:  Follow-Up Visit  Chief Complaint:  Hypertension  History of Present Illness:    Grace Gallagher is a 49 y.o. female with HTN, HLD, DM2, obesity. She works as a [employer]ar[employer] manager.    Previously evaluated by Dr. Fletcher Anon in 2015 for hypertension and by Dr. Tommi Rumps of [facility] cardiology in 2018.  Prior, 2015 notable for normal renal artery Doppler and aldosterone/renal ratio.  Stress echo through [facility] 2018 was performed but results are unavailable.  She was seen in consult by Dr. And 04/26/2019 after establishing with a new PCP and had an ED visit for hypertensive urgency.   She had a diagnosis of sleep apnea in 2017 and purchased CPAP device but misplaced and had not used.   Of note, has not tolerated higher doses of amlodipine due to LE edema.    Seen by her PCP 11/28/2019 -referral was placed for split-night sleep study. She has been recommended for CPAP.  Her  Toprol was increased to 100 mg daily due to elevated blood pressure.   She was admitted 12/21/2019-12/23/2019 due to COVID-19.  She was treated with remdesivir and dexamethasone. Some hypokalemia noted which resolved. Her treatment course of remdesivir and Decadron were completed at the outpatient infusion clinic. BP during admission 130s-180s.  Last seen via telemedicine 01/03/20 with Bps at home 170s/90s on arm cuff. Her Metoprolol was switched to Coreg 25mg  BID.  Reports feeling overall well. Is at work at the time of our visit. Checked BP today while at work 160/110, anticipate elevate as she is up and moving constantly while at work. When checked at home systolic readings in the 0000000.   She has been working with her primary care provider regarding elevated blood sugars after COVID-19 infection.  She has recently started Novolin 70/30.  Denies chest pain, pressure, tightness.  No shortness of breath or dyspnea on exertion.  Her only residual symptoms of COVID-19 are mild fatigue.  The patient does not have symptoms concerning for COVID-19 infection (fever, chills, cough, or new shortness of breath).    Past Medical History:  Diagnosis Date  . Anemia   . Diabetes mellitus without complication ([place])   . Hypertension   . Obesity    Past Surgical History:  Procedure Laterality Date  . ABDOMINAL HYSTERECTOMY    . BREAST SURGERY  08/2011   breast reduction  . CYSTOSCOPY  04/12/2018   Procedure: CYSTOSCOPY;  Surgeon: Gae Dry, MD;  Location: [facility] ORS;  Service: Gynecology;;  . FOOT SURGERY Left    cyst removed  . GALLBLADDER SURGERY  09/03/2018  . LAPAROSCOPIC HYSTERECTOMY Bilateral 04/12/2018   Procedure: HYSTERECTOMY TOTAL LAPAROSCOPIC BILATERAL SALPINGECTOMY;  Surgeon: Gae Dry, MD;  Location: [facility] ORS;  Service: Gynecology;  Laterality: Bilateral;  . TUBAL LIGATION       Current Meds  Medication Sig  . Ascorbic Acid (VITAMIN C) 1000 MG tablet Take 1,000 mg by mouth  daily.  Marland Kitchen aspirin 325 MG tablet Take 325 mg by mouth daily.  Marland Kitchen atorvastatin (LIPITOR) 40 MG tablet Take 1 tablet (40 mg) by mouth once daily  . carvedilol (COREG) 25 MG tablet Take 1 tablet (25 mg total) by mouth 2 (two) times daily.  . ferrous gluconate (FERGON) 324 MG tablet Take 324 mg by mouth every morning.  . furosemide (LASIX) 40 MG tablet Take 1 tablet (40 mg) by mouth once daily  . guaiFENesin (ROBITUSSIN) 100 MG/5ML SOLN Take 5 mLs (100 mg total) by mouth every 4 (four) hours as needed for cough or to loosen phlegm.  . insulin NPH-regular Human (70-30) 100 UNIT/ML injection Inject 20 Units into the skin 2 (two) times daily with a meal.  . metFORMIN (GLUCOPHAGE) 1000 MG tablet Take 1,000 mg by mouth 2 (two) times daily with a meal.  . Multiple Vitamins-Minerals (ZINC PO) Take by mouth daily.  Marland Kitchen olmesartan (BENICAR) 40 MG tablet Take 40 mg by mouth daily.  Marland Kitchen VITAMIN D PO Take by mouth daily.     Allergies:   Hydrocodone   Social History   Tobacco Use  . Smoking status: Never Smoker  . Smokeless tobacco: Never Used  Substance Use Topics  . Alcohol use: No  . Drug use: No     Family Hx: The patient's family history includes Cancer in her father; Diabetes in her brother; Heart attack (age of onset: 21) in her mother; Heart attack (age of onset: 37) in her brother; Heart failure in her mother; Hypertension in her brother and mother.  ROS:   Please see the history of present illness.    Review of Systems  Constitution: Positive for malaise/fatigue ("very mild"). Negative for chills and fever.  Cardiovascular: Negative for chest pain, dyspnea on exertion, leg swelling, near-syncope, orthopnea, palpitations and syncope.  Respiratory: Negative for cough, shortness of breath and wheezing.   Gastrointestinal: Negative for nausea and vomiting.  Neurological: Negative for dizziness, light-headedness and weakness.   All other systems reviewed and are negative.  Labs/Other Tests and  Data Reviewed:    EKG:  An ECG dated 12/21/19 was personally reviewed today and demonstrated:  SR 95 bpm no acute ST/T wave chnages  Recent Labs: 12/22/2019: B Natriuretic Peptide 11.3 12/23/2019: ALT 31; BUN 11; Creatinine, Ser 0.83; Hemoglobin 12.4; Platelets 132; Potassium 3.7; Sodium 140   Recent Lipid Panel Lab Results  Component Value Date/Time   CHOL 166 07/01/2013 03:58 PM   TRIG 70 12/21/2019 09:20 PM   HDL 53.00 07/01/2013 03:58 PM   CHOLHDL 3 07/01/2013 03:58 PM   LDLCALC 93 07/01/2013 03:58 PM    Wt Readings from Last 3 Encounters:  01/23/20 267 lb (121.1 kg)  01/03/20 263 lb (119.3 kg)  12/21/19 263 lb (119.3 kg)     Objective:    Vital Signs:  BP (!) 161/110   Pulse 80   Ht 5\' 6"  (1.676 m)   Wt 267 lb (121.1 kg)   LMP 04/03/2018   BMI 43.09 kg/m    VITAL  SIGNS:  reviewed  ASSESSMENT & PLAN:    1. HTN - Reports since switching from Metoprolol to Carvedilol tells me BP improved with systolic readings at home 140s. Goal BP <130/80. Start HCTZ 25mg  daily. BMET Monday for baseline and repeat in 2 weeks at office visit. Low sodium diet encouraged. Continue Olmesartan 40mg  daily and Carvedilol 25mg  BID. 2. Hypokalemia - Noted during recent hospitalization. BMET Monday for baseline as starting HCTZ. May require potassium supplementation.  3. COVID19 infection - Dx 12/21/19. Hospitalized and completed infusions at outpatient clinic. She is 21 days from positive test. Likely contributory to recently elevated BP and blood sugar. Continue to monitor closely as she continues to recuperate and can consider down titration at future visit if needed.  4. LE edema - Well controlled on Lasix 40mg  as needed. Recommend compression stockings, low sodium diet, elevating lower extremities. 5. Obesity - Encouraged continued weigh loss via diet and exercise.  6. DM2 - A1c 12/2019 6.4. Follows with PCP. Recently elevated CBGs likely residual effects of COVID, has started Novolog 70/30.   7. HLD - LDL goal <100. LDL 11/08/19 was 76. Continue Atorvastatin 40mg  daily.  8. OSA - Sleep study with recommendation for CPAP. Encouraged her to call recommending provider back as she is 21 days from COVID19 positive test and likely can be fitted now. Anticipate uncontrolled OSA is contributing to HTN.   COVID-19 Education: The signs and symptoms of COVID-19 were discussed with the patient and how to seek care for testing (follow up with PCP or arrange E-visit).   The importance of social distancing was discussed today.  Time:   Today, I have spent 15 minutes with the patient with telehealth technology discussing the above problems.     Medication Adjustments/Labs and Tests Ordered: Current medicines are reviewed at length with the patient today.  Concerns regarding medicines are outlined above.   Tests Ordered: Orders Placed This Encounter  Procedures  . Basic metabolic panel    Medication Changes: Meds ordered this encounter  Medications  . hydrochlorothiazide (HYDRODIURIL) 25 MG tablet    Sig: Take 1 tablet (25 mg total) by mouth daily.    Dispense:  30 tablet    Refill:  0    Order Specific Question:   Supervising Provider    Answer:   Richardo Priest [id number]    Follow Up: BMET at the [facility] Monday. Follow up In Person in 2 week(s) with Dr. Saunders Revel or APP.   Signed, Loel Dubonnet, NP  01/23/2020 4:15 PM    [facility]

## 2020-01-23 NOTE — Patient Instructions (Signed)
Medication Instructions:  Your physician has recommended you make the following change in your medication:   START Hydrochlorothiazide 25mg  (one tablet) daily  *If you need a refill on your cardiac medications before your next appointment, please call your pharmacy*  Lab Work: Your physician recommends that you return for lab work Monday: BMET  Simply stop by the [facility] on Friday or Monday (depending on the weather) for labs. You do not need to be fasting. You do not need an appointment.   This BMET will allow [place] to check your potassium as a baseline for starting the new medication.   If you have labs (blood work) drawn today and your tests are completely normal, you will receive your results only by: Marland Kitchen MyChart Message (if you have MyChart) OR . A paper copy in the mail If you have any lab test that is abnormal or we need to change your treatment, we will call you to review the results.  Testing/Procedures: None ordered today.  Follow-Up: At [facility], you and your health needs are our priority.  As part of our continuing mission to provide you with exceptional heart care, we have created designated Provider Care Teams.  These Care Teams include your primary Cardiologist (physician) and Advanced Practice Providers (APPs -  Physician Assistants and Nurse Practitioners) who all work together to provide you with the care you need, when you need it.  Your next appointment:   2 week(s)  The format for your next appointment:   In Person  Provider:    You may see Nelva Bush, MD or one of the following Advanced Practice Providers on your designated Care Team:    Murray Hodgkins, NP  Christell Faith, PA-C  Marrianne Mood, PA-C   Other Instructions  It was a pleasure to speak with you again today!   We will plan to see you in the office in 2 weeks. Please bring a log of your recent blood pressure readings as well as your blood pressure cuff.   Hydrochlorothiazide,  HCTZ Oral Capsules or Tablets What is this medicine? HYDROCHLOROTHIAZIDE (hye droe klor oh THYE a zide) is a diuretic. It helps you make more urine and to lose salt and excess water from your body. It treats swelling from heart, kidney, or liver disease. It also treats high blood pressure. This medicine may be used for other purposes; ask your health care provider or pharmacist if you have questions. COMMON BRAND NAME(S): Esidrix, Ezide, HydroDIURIL, Microzide, Oretic, Zide What should I tell my health care provider before I take this medicine? They need to know if you have any of these conditions:  diabetes  gout  immune system problems, like lupus  kidney disease or kidney stones  liver disease  pancreatitis  small amount of urine or difficulty passing urine  an unusual or allergic reaction to hydrochlorothiazide, sulfa drugs, other medicines, foods, dyes, or preservatives  pregnant or trying to get pregnant  breast-feeding How should I use this medicine? Take this drug by mouth. Take it as directed on the prescription label at the same time every day. You can take it with or without food. If it upsets your stomach, take it with food. Keep taking it unless your health care provider tells you to stop. Talk to your health care provider about the use of this drug in children. While it may be prescribed for children as young as newborns for selected conditions, precautions do apply. Overdosage: If you think you have taken too  much of this medicine contact a poison control center or emergency room at once. NOTE: This medicine is only for you. Do not share this medicine with others. What if I miss a dose? If you miss a dose, take it as soon as you can. If it is almost time for your next dose, take only that dose. Do not take double or extra doses. What may interact with this medicine?  cholestyramine  colestipol  digoxin  dofetilide  lithium  medicines for blood  pressure  medicines for diabetes  medicines that relax muscles for surgery  other diuretics  steroid medicines like prednisone or cortisone This list may not describe all possible interactions. Give your health care provider a list of all the medicines, herbs, non-prescription drugs, or dietary supplements you use. Also tell them if you smoke, drink alcohol, or use illegal drugs. Some items may interact with your medicine. What should I watch for while using this medicine? Visit your doctor or health care professional for regular checks on your progress. Check your blood pressure as directed. Ask your doctor or health care professional what your blood pressure should be and when you should contact him or her. Talk to your health care professional about your risk of skin cancer. You may be more at risk for skin cancer if you take this medicine. This medicine can make you more sensitive to the sun. Keep out of the sun. If you cannot avoid being in the sun, wear protective clothing and use sunscreen. Do not use sun lamps or tanning beds/booths. You may need to be on a special diet while taking this medicine. Ask your doctor. Check with your doctor or health care professional if you get an attack of severe diarrhea, nausea and vomiting, or if you sweat a lot. The loss of too much body fluid can make it dangerous for you to take this medicine. You may get drowsy or dizzy. Do not drive, use machinery, or do anything that needs mental alertness until you know how this medicine affects you. Do not stand or sit up quickly, especially if you are an older patient. This reduces the risk of dizzy or fainting spells. Alcohol may interfere with the effect of this medicine. Avoid alcoholic drinks. This medicine may increase blood sugar. Ask your healthcare provider if changes in diet or medicines are needed if you have diabetes. What side effects may I notice from receiving this medicine? Side effects that you  should report to your doctor or health care professional as soon as possible:  allergic reactions such as skin rash or itching, hives, swelling of the lips, mouth, tongue, or throat  changes in vision  chest pain  eye pain  fast or irregular heartbeat  feeling faint or lightheaded, falls  gout attack  muscle pain or cramps  pain or difficulty when passing urine  pain, tingling, numbness in the hands or feet  redness, blistering, peeling or loosening of the skin, including inside the mouth   signs and symptoms of high blood sugar such as being more thirsty or hungry or having to urinate more than normal. You may also feel very tired or have blurry vision.  unusually weak Side effects that usually do not require medical attention (report to your doctor or health care professional if they continue or are bothersome):  change in sex drive or performance  dry mouth  headache  stomach upset This list may not describe all possible side effects. Call your doctor for medical  advice about side effects. You may report side effects to FDA at 1-800-FDA-1088. Where should I keep my medicine? Keep out of the reach of children and pets. Store at room temperature between 20 and 25 degrees C (68 and 77 degrees F). Protect from light and moisture. Keep the container tightly closed. Do not freeze. Throw away any unused drug after the expiration date. NOTE: This sheet is a summary. It may not cover all possible information. If you have questions about this medicine, talk to your doctor, pharmacist, or health care provider.  2020 Elsevier/Gold Standard (2019-07-25 16:52:59)

## 2020-01-24 ENCOUNTER — Telehealth: Payer: Self-pay | Admitting: Internal Medicine

## 2020-01-24 NOTE — Telephone Encounter (Signed)
Attempted to schedule 2 wk fu per checkout 01/23/20.  LMOV to call office.

## 2020-01-27 ENCOUNTER — Other Ambulatory Visit
Admission: RE | Admit: 2020-01-27 | Discharge: 2020-01-27 | Disposition: A | Discharge status: Home / Self Care | Payer: Commercial Managed Care - PPO | Source: Ambulatory Visit | Attending: Family | Admitting: Family

## 2020-01-27 DIAGNOSIS — E876 Hypokalemia: Secondary | ICD-10-CM | POA: Diagnosis present

## 2020-01-27 DIAGNOSIS — Z79899 Other long term (current) drug therapy: Secondary | ICD-10-CM | POA: Diagnosis present

## 2020-01-27 DIAGNOSIS — I10 Essential (primary) hypertension: Secondary | ICD-10-CM | POA: Diagnosis not present

## 2020-01-27 LAB — BASIC METABOLIC PANEL
Anion gap: 10 (ref 5–15)
BUN: 15 mg/dL (ref 6–20)
CO2: 27 mmol/L (ref 22–32)
Calcium: 8.6 mg/dL — ABNORMAL LOW (ref 8.9–10.3)
Chloride: 98 mmol/L (ref 98–111)
Creatinine, Ser: 0.84 mg/dL (ref 0.44–1.00)
GFR calc Af Amer: >60 mL/min (ref >60)
GFR calc non Af Amer: >60 mL/min (ref >60)
Glucose, Bld: 447 mg/dL — ABNORMAL HIGH (ref 70–99)
Potassium: 3.6 mmol/L (ref 3.5–5.1)
Sodium: 135 mmol/L (ref 135–145)

## 2020-01-31 NOTE — Telephone Encounter (Signed)
Attempted to schedule.  LMOV to call office.  ° °

## 2020-02-02 ENCOUNTER — Other Ambulatory Visit: Payer: Self-pay | Admitting: Internal Medicine

## 2020-02-03 NOTE — Telephone Encounter (Signed)
Please advise if ok to refill Furosemide 40 mg tablet qd.

## 2020-02-10 NOTE — Telephone Encounter (Signed)
Attempted to schedule.  LMOV to call office.  ° °

## 2020-02-24 ENCOUNTER — Other Ambulatory Visit: Payer: Self-pay | Admitting: Internal Medicine

## 2020-03-17 ENCOUNTER — Encounter: Payer: Self-pay | Admitting: Internal Medicine

## 2020-03-17 NOTE — Telephone Encounter (Signed)
Unable to reach for scheduling x 3 closing encounter and mailed letter.

## 2021-08-05 DIAGNOSIS — E109 Type 1 diabetes mellitus without complications: Secondary | ICD-10-CM | POA: Insufficient documentation

## 2022-02-09 ENCOUNTER — Encounter: Payer: Self-pay | Admitting: Podiatry

## 2022-02-09 ENCOUNTER — Other Ambulatory Visit: Payer: Self-pay

## 2022-02-09 ENCOUNTER — Ambulatory Visit (INDEPENDENT_AMBULATORY_CARE_PROVIDER_SITE_OTHER): Payer: Commercial Managed Care - PPO

## 2022-02-09 ENCOUNTER — Ambulatory Visit: Payer: Commercial Managed Care - PPO | Admitting: Podiatry

## 2022-02-09 DIAGNOSIS — M722 Plantar fascial fibromatosis: Secondary | ICD-10-CM

## 2022-02-09 MED ORDER — METHYLPREDNISOLONE 4 MG PO TBPK: ORAL_TABLET | ORAL | 0 refills | Status: DC

## 2022-02-09 MED ORDER — MELOXICAM 15 MG PO TABS: 15.0000 mg | ORAL_TABLET | Freq: Every day | ORAL | 3 refills | Status: DC

## 2022-02-09 MED ORDER — TRIAMCINOLONE ACETONIDE 40 MG/ML IJ SUSP
20.0000 mg | Freq: Once | INTRAMUSCULAR | Status: AC
  Administered 2022-02-09: 20 mg

## 2022-02-09 NOTE — Progress Notes (Signed)
?Subjective:  ?Patient ID: Grace Gallagher, female    DOB: [birth year],  MRN: [id number] ?HPI ?Chief Complaint  ?Patient presents with  ? Foot Pain  ?  Plantar heel bilateral (L>R) - aching x months, AM pain, tried Ibuprofen and acewrap  ? Diabetes  ?  Last a1c was 6.4  ? New Patient (Initial Visit)  ? ? ?51 y.o. female presents with the above complaint.  ? ?ROS: Denies fever chills nausea vomiting muscle aches pains calf pain back pain chest pain shortness of breath. ? ?Past Medical History:  ?Diagnosis Date  ? Anemia   ? Diabetes mellitus without complication ([place])   ? Hypertension   ? Obesity   ? ?Past Surgical History:  ?Procedure Laterality Date  ? ABDOMINAL HYSTERECTOMY    ? BREAST SURGERY  08/2011  ? breast reduction  ? CYSTOSCOPY  04/12/2018  ? Procedure: CYSTOSCOPY;  Surgeon: Grace Dry, MD;  Location: [facility] ORS;  Service: Gynecology;;  ? FOOT SURGERY Left   ? cyst removed  ? GALLBLADDER SURGERY  09/03/2018  ? LAPAROSCOPIC HYSTERECTOMY Bilateral 04/12/2018  ? Procedure: HYSTERECTOMY TOTAL LAPAROSCOPIC BILATERAL SALPINGECTOMY;  Surgeon: Grace Dry, MD;  Location: [facility] ORS;  Service: Gynecology;  Laterality: Bilateral;  ? TUBAL LIGATION    ? ? ?Current Outpatient Medications:  ?  meloxicam (MOBIC) 15 MG tablet, Take 1 tablet (15 mg total) by mouth daily., Disp: 30 tablet, Rfl: 3 ?  methylPREDNISolone (MEDROL DOSEPAK) 4 MG TBPK tablet, 6 day dose pack - take as directed, Disp: 21 tablet, Rfl: 0 ?  Ascorbic Acid (VITAMIN C) 1000 MG tablet, Take 1,000 mg by mouth daily., Disp: , Rfl:  ?  carvedilol (COREG) 25 MG tablet, Take 1 tablet (25 mg total) by mouth 2 (two) times daily., Disp: 60 tablet, Rfl: 2 ?  Continuous Blood Gluc Sensor (DEXCOM G6 SENSOR) MISC, SMARTSIG:1 Topical Every 10 Days, Disp: , Rfl:  ?  Continuous Blood Gluc Transmit (DEXCOM G6 TRANSMITTER) MISC, See admin instructions., Disp: , Rfl:  ?  diltiazem (CARDIZEM CD) 180 MG 24 hr capsule, Take 180 mg by mouth daily., Disp: , Rfl:  ?  HUMALOG  KWIKPEN 200 UNIT/ML KwikPen, SMARTSIG:Unit(s) SUB-Q PRN, Disp: , Rfl:  ?  ibuprofen (ADVIL) 800 MG tablet, Take 800 mg by mouth every 6 (six) hours as needed., Disp: , Rfl:  ?  insulin NPH-regular Human (70-30) 100 UNIT/ML injection, Inject 20 Units into the skin 2 (two) times daily with a meal., Disp: , Rfl:  ?  metFORMIN (GLUCOPHAGE) 1000 MG tablet, Take 1,000 mg by mouth 2 (two) times daily with a meal., Disp: , Rfl:  ?  topiramate (TOPAMAX) 25 MG tablet, Take by mouth., Disp: , Rfl:  ?  TOUJEO SOLOSTAR 300 UNIT/ML Solostar Pen, Inject into the skin., Disp: , Rfl:  ?  valsartan (DIOVAN) 320 MG tablet, Take 320 mg by mouth daily., Disp: , Rfl:  ?  VITAMIN D PO, Take by mouth daily., Disp: , Rfl:  ? ?Allergies  ?Allergen Reactions  ? Hydrocodone Nausea Only  ? Influenza Virus Vaccine Rash  ? ?Review of Systems ?Objective:  ?There were no vitals filed for this visit. ? ?General: Well developed, nourished, in no acute distress, alert and oriented x3  ? ?Dermatological: Skin is warm, Gallagher and supple bilateral. Nails x 10 are well maintained; remaining integument appears unremarkable at this time. There are no open sores, no preulcerative lesions, no rash or signs of infection present. ? ?Vascular: Dorsalis Pedis artery and  Posterior Tibial artery pedal pulses are 2/4 bilateral with immedate capillary fill time. Pedal hair growth present. No varicosities and no lower extremity edema present bilateral.  ? ?Neruologic: Grossly intact via light touch bilateral. Vibratory intact via tuning fork bilateral. Protective threshold with Semmes Wienstein monofilament intact to all pedal sites bilateral. Patellar and Achilles deep tendon reflexes 2+ bilateral. No Babinski or clonus noted bilateral.  ? ?Musculoskeletal: No gross boney pedal deformities bilateral. No pain, crepitus, or limitation noted with foot and ankle range of motion bilateral. Muscular strength 5/5 in all groups tested bilateral.  Pain on palpation medial  calcaneal tubercle left foot.  Pes planovalgus left foot.  Pes planovalgus right foot. ? ?Gait: Unassisted, antalgic gait left foot ? ? ?Radiographs: ? ?Radiographs taken today demonstrate an osseously mature left foot.  Pes planovalgus left foot.  Soft tissue increase in density plantar fashion calcaneal insertion site left foot.  Plantar distally oriented calcaneal heel spur.  Soft tissue increase in density consistent with Planter fasciitis. ? ?Assessment & Plan:  ? ?Assessment: Pes planovalgus bilateral Planter fasciitis left. ? ?Plan: Discussed etiology pathology conservative surgical therapies at this point I injected her left heel today started on methylprednisolone to be followed by meloxicam.  Instructed her to be tight with her blood sugars and watch her meals and I am continue to take her diabetes medication.  Placed her in a plantar fascial brace discussed appropriate shoe gear stretching exercise ice therapy sugar modifications follow-up with her in 1 month ? ? ? ? ?Alyiah Ulloa T. Tiro, DPM ?

## 2022-02-09 NOTE — Patient Instructions (Signed)

## 2022-02-10 ENCOUNTER — Ambulatory Visit: Payer: Commercial Managed Care - PPO | Admitting: Podiatry

## 2022-02-14 ENCOUNTER — Ambulatory Visit: Payer: Commercial Managed Care - PPO | Admitting: Podiatry

## 2022-03-14 ENCOUNTER — Ambulatory Visit: Payer: Commercial Managed Care - PPO | Admitting: Podiatry

## 2022-03-16 ENCOUNTER — Ambulatory Visit: Payer: Commercial Managed Care - PPO | Admitting: Podiatry

## 2022-03-28 ENCOUNTER — Ambulatory Visit: Payer: Commercial Managed Care - PPO | Admitting: Podiatry

## 2022-04-01 ENCOUNTER — Encounter (HOSPITAL_COMMUNITY): Payer: Self-pay

## 2022-04-01 ENCOUNTER — Encounter (HOSPITAL_BASED_OUTPATIENT_CLINIC_OR_DEPARTMENT_OTHER): Payer: Self-pay

## 2022-04-01 ENCOUNTER — Emergency Department (HOSPITAL_COMMUNITY)
Admission: EM | Admit: 2022-04-01 | Discharge: 2022-04-01 | Discharge status: Against Medical Advice | Payer: Commercial Managed Care - PPO | Attending: Emergency Medicine | Admitting: Emergency Medicine

## 2022-04-01 ENCOUNTER — Emergency Department (HOSPITAL_BASED_OUTPATIENT_CLINIC_OR_DEPARTMENT_OTHER)
Admission: EM | Admit: 2022-04-01 | Discharge: 2022-04-01 | Disposition: A | Discharge status: Home / Self Care | Payer: Commercial Managed Care - PPO | Source: Home / Self Care | Attending: Emergency Medicine | Admitting: Emergency Medicine

## 2022-04-01 ENCOUNTER — Other Ambulatory Visit: Payer: Self-pay

## 2022-04-01 DIAGNOSIS — Z794 Long term (current) use of insulin: Secondary | ICD-10-CM | POA: Diagnosis not present

## 2022-04-01 DIAGNOSIS — Z79899 Other long term (current) drug therapy: Secondary | ICD-10-CM | POA: Insufficient documentation

## 2022-04-01 DIAGNOSIS — I10 Essential (primary) hypertension: Secondary | ICD-10-CM | POA: Insufficient documentation

## 2022-04-01 DIAGNOSIS — E876 Hypokalemia: Secondary | ICD-10-CM | POA: Insufficient documentation

## 2022-04-01 DIAGNOSIS — R350 Frequency of micturition: Secondary | ICD-10-CM | POA: Diagnosis not present

## 2022-04-01 DIAGNOSIS — R739 Hyperglycemia, unspecified: Secondary | ICD-10-CM

## 2022-04-01 DIAGNOSIS — E1165 Type 2 diabetes mellitus with hyperglycemia: Secondary | ICD-10-CM | POA: Diagnosis not present

## 2022-04-01 DIAGNOSIS — R197 Diarrhea, unspecified: Secondary | ICD-10-CM | POA: Insufficient documentation

## 2022-04-01 DIAGNOSIS — R42 Dizziness and giddiness: Secondary | ICD-10-CM | POA: Diagnosis not present

## 2022-04-01 DIAGNOSIS — Z7984 Long term (current) use of oral hypoglycemic drugs: Secondary | ICD-10-CM | POA: Insufficient documentation

## 2022-04-01 DIAGNOSIS — R109 Unspecified abdominal pain: Secondary | ICD-10-CM | POA: Insufficient documentation

## 2022-04-01 LAB — COMPREHENSIVE METABOLIC PANEL
ALT: 20 U/L (ref 0–44)
AST: 16 U/L (ref 15–41)
Albumin: 3.9 g/dL (ref 3.5–5.0)
Alkaline Phosphatase: 86 U/L (ref 38–126)
Anion gap: 13 (ref 5–15)
BUN: 16 mg/dL (ref 6–20)
CO2: 24 mmol/L (ref 22–32)
Calcium: 9.6 mg/dL (ref 8.9–10.3)
Chloride: 96 mmol/L — ABNORMAL LOW (ref 98–111)
Creatinine, Ser: 1.11 mg/dL — ABNORMAL HIGH (ref 0.44–1.00)
GFR, Estimated: >60 mL/min (ref >60)
Glucose, Bld: 463 mg/dL — ABNORMAL HIGH (ref 70–99)
Potassium: 3.6 mmol/L (ref 3.5–5.1)
Sodium: 133 mmol/L — ABNORMAL LOW (ref 135–145)
Total Bilirubin: 0.8 mg/dL (ref 0.3–1.2)
Total Protein: 6.8 g/dL (ref 6.5–8.1)

## 2022-04-01 LAB — URINALYSIS, ROUTINE W REFLEX MICROSCOPIC
Bilirubin Urine: NEGATIVE
Bilirubin Urine: NEGATIVE
Glucose, UA: >=500 mg/dL — AB
Glucose, UA: >1000 mg/dL — AB
Hgb urine dipstick: NEGATIVE
Hgb urine dipstick: NEGATIVE
Ketones, ur: NEGATIVE mg/dL
Ketones, ur: NEGATIVE mg/dL
Leukocytes,Ua: NEGATIVE
Leukocytes,Ua: NEGATIVE
Nitrite: NEGATIVE
Nitrite: NEGATIVE
Protein, ur: NEGATIVE mg/dL
Protein, ur: 30 mg/dL — AB
Specific Gravity, Urine: 1.032 — ABNORMAL HIGH (ref 1.005–1.030)
Specific Gravity, Urine: 1.04 — ABNORMAL HIGH (ref 1.005–1.030)
pH: 5 (ref 5.0–8.0)
pH: 5 (ref 5.0–8.0)

## 2022-04-01 LAB — CBC WITH DIFFERENTIAL/PLATELET
Abs Immature Granulocytes: 0.01 10*3/uL (ref 0.00–0.07)
Basophils Absolute: 0 10*3/uL (ref 0.0–0.1)
Basophils Relative: 0 %
Eosinophils Absolute: 0.1 10*3/uL (ref 0.0–0.5)
Eosinophils Relative: 1 %
HCT: 47.8 % — ABNORMAL HIGH (ref 36.0–46.0)
Hemoglobin: 14.1 g/dL (ref 12.0–15.0)
Immature Granulocytes: 0 %
Lymphocytes Relative: 38 %
Lymphs Abs: 1.9 10*3/uL (ref 0.7–4.0)
MCH: 22.9 pg — ABNORMAL LOW (ref 26.0–34.0)
MCHC: 29.5 g/dL — ABNORMAL LOW (ref 30.0–36.0)
MCV: 77.7 fL — ABNORMAL LOW (ref 80.0–100.0)
Monocytes Absolute: 0.5 10*3/uL (ref 0.1–1.0)
Monocytes Relative: 9 %
Neutro Abs: 2.6 10*3/uL (ref 1.7–7.7)
Neutrophils Relative %: 52 %
Platelets: 153 10*3/uL (ref 150–400)
RBC: 6.15 MIL/uL — ABNORMAL HIGH (ref 3.87–5.11)
RDW: 13.8 % (ref 11.5–15.5)
WBC: 5 10*3/uL (ref 4.0–10.5)
nRBC: 0 % (ref 0.0–0.2)

## 2022-04-01 LAB — CBC
HCT: 45.5 % (ref 36.0–46.0)
Hemoglobin: 13.9 g/dL (ref 12.0–15.0)
MCH: 22 pg — ABNORMAL LOW (ref 26.0–34.0)
MCHC: 30.5 g/dL (ref 30.0–36.0)
MCV: 72 fL — ABNORMAL LOW (ref 80.0–100.0)
Platelets: 191 10*3/uL (ref 150–400)
RBC: 6.32 MIL/uL — ABNORMAL HIGH (ref 3.87–5.11)
RDW: 13.9 % (ref 11.5–15.5)
WBC: 5.8 10*3/uL (ref 4.0–10.5)
nRBC: 0 % (ref 0.0–0.2)

## 2022-04-01 LAB — BASIC METABOLIC PANEL
Anion gap: 9 (ref 5–15)
BUN: 21 mg/dL — ABNORMAL HIGH (ref 6–20)
CO2: 34 mmol/L — ABNORMAL HIGH (ref 22–32)
Calcium: 11.1 mg/dL — ABNORMAL HIGH (ref 8.9–10.3)
Chloride: 97 mmol/L — ABNORMAL LOW (ref 98–111)
Creatinine, Ser: 1.32 mg/dL — ABNORMAL HIGH (ref 0.44–1.00)
GFR, Estimated: 49 mL/min — ABNORMAL LOW (ref >60)
Glucose, Bld: 231 mg/dL — ABNORMAL HIGH (ref 70–99)
Potassium: 3.2 mmol/L — ABNORMAL LOW (ref 3.5–5.1)
Sodium: 140 mmol/L (ref 135–145)

## 2022-04-01 LAB — I-STAT VENOUS BLOOD GAS, ED
Acid-Base Excess: 7 mmol/L — ABNORMAL HIGH (ref 0.0–2.0)
Bicarbonate: 27.4 mmol/L (ref 20.0–28.0)
Calcium, Ion: 0.97 mmol/L — ABNORMAL LOW (ref 1.15–1.40)
HCT: 45 % (ref 36.0–46.0)
Hemoglobin: 15.3 g/dL — ABNORMAL HIGH (ref 12.0–15.0)
O2 Saturation: 99 %
Potassium: 3.6 mmol/L (ref 3.5–5.1)
Sodium: 129 mmol/L — ABNORMAL LOW (ref 135–145)
TCO2: 28 mmol/L (ref 22–32)
pCO2, Ven: 27.5 mmHg — ABNORMAL LOW (ref 44–60)
pH, Ven: 7.606 (ref 7.25–7.43)
pO2, Ven: 126 mmHg — ABNORMAL HIGH (ref 32–45)

## 2022-04-01 LAB — LIPASE, BLOOD: Lipase: 40 U/L (ref 11–51)

## 2022-04-01 LAB — CBG MONITORING, ED
Glucose-Capillary: 472 mg/dL — ABNORMAL HIGH (ref 70–99)
Glucose-Capillary: 208 mg/dL — ABNORMAL HIGH (ref 70–99)

## 2022-04-01 LAB — PREGNANCY, URINE: Preg Test, Ur: NEGATIVE

## 2022-04-01 MED ORDER — ONDANSETRON 4 MG PO TBDP: 4.0000 mg | ORAL_TABLET | Freq: Three times a day (TID) | ORAL | 0 refills | Status: DC | PRN

## 2022-04-01 MED ORDER — POTASSIUM CHLORIDE CRYS ER 20 MEQ PO TBCR
40.0000 meq | EXTENDED_RELEASE_TABLET | Freq: Two times a day (BID) | ORAL | Status: DC
  Administered 2022-04-01: 40 meq via ORAL
  Filled 2022-04-01: qty 2

## 2022-04-01 MED ORDER — LACTATED RINGERS IV BOLUS
1000.0000 mL | Freq: Once | INTRAVENOUS | Status: AC
  Administered 2022-04-01: 1000 mL via INTRAVENOUS

## 2022-04-01 NOTE — ED Provider Triage Note (Signed)
Emergency Medicine Provider Triage Evaluation Note ? ?Grace Gallagher , a 51 y.o. female  was evaluated in triage.  Pt complains of hyperglycemia. ?Lightheaded this afternoon while working.  This has been persistent since then.  Blood sugar 588 today. Also has 2 days of diarrhea. No blood. PCP told her to come to ED. ?On humalog sliding scale and Toujeo. Sugars have been on the higher side over the past several months, but seem significantly worse since diarrhea started.  ? ?Review of Systems  ?Positive: Lightheadedness, hyperglycemia, diarrhea ?Negative:  ? ?Physical Exam  ?BP (!) 147/93 (BP Location: Right Arm)   Pulse 74   Temp 98.8 ?F (37.1 ?C) (Oral)   Resp 16   LMP 04/03/2018   SpO2 99%  ?Gen:   Awake, no distress   ?Resp:  Normal effort  ?MSK:   Moves extremities without difficulty  ?Other:   ? ?Medical Decision Making  ?Medically screening exam initiated at 2:48 PM.  Appropriate orders placed.  Grace Gallagher was informed that the remainder of the evaluation will be completed by another provider, this initial triage assessment does not replace that evaluation, and the importance of remaining in the ED until their evaluation is complete. ? ? ?  ?Adolphus Birchwood, PA-C ?04/01/22 1454 ? ?

## 2022-04-01 NOTE — ED Triage Notes (Signed)
Pt arrived POV from work c/o hyperglycemia. Pt states her dr told her to come here. Pt denies any other complaints.   ?

## 2022-04-01 NOTE — ED Notes (Signed)
Called PT  for vitals 18:14 no answer. ? ?

## 2022-04-01 NOTE — ED Triage Notes (Signed)
Pt presented via triage with c/o low BG (472)  around 1400. Pt stated that the last time she ate was this AM. Pt admits to having diarrhea last night and increase in fatigue today. Pt stated that she spoke to a MD and the requested that she need to come in a get IV fluids.  ? ?Hx diabetes and last took 33 units of Humalog around 0500 this morning  ? ?BG 208 ?

## 2022-04-01 NOTE — ED Provider Notes (Signed)
?[facility] ?Provider Note ? ? ?CSN: [id number] ?Arrival date & time: 04/01/22  1900 ? ?  ? ?History ? ?Chief Complaint  ?Patient presents with  ? Hyperglycemia  ? ? ?Belle Charlie Pranger is a 51 y.o. female. ? ?HPI ? ?  ? ?Sugar spiked, 588, 472 ?Last night began to have diarrhea, up all night in the bathroom, probably went 5 times last night and 2 times today ?Dr had recommended coming to get IV fluids ?Nausea, no vomiting  ?No black or bloody stools ?Abdominal pain last night, not much today, left upper ?No fever ?No dysuria ?Has had frequency of urinatino, increcreased thirst ?Lightheaded at work  ?No chest pain, dyspnea, or cough ? ? ?Past Medical History:  ?Diagnosis Date  ? Anemia   ? Diabetes mellitus without complication ([facility])   ? Hypertension   ? Obesity   ?  ? ?Home Medications ?Prior to Admission medications   ?Medication Sig Start Date End Date Taking? Authorizing Provider  ?ondansetron (ZOFRAN-ODT) 4 MG disintegrating tablet Take 1 tablet (4 mg total) by mouth every 8 (eight) hours as needed for nausea or vomiting. 04/01/22  Yes Gareth Morgan, MD  ?Ascorbic Acid (VITAMIN C) 1000 MG tablet Take 1,000 mg by mouth daily.    [provider]  ?carvedilol (COREG) 25 MG tablet Take 1 tablet (25 mg total) by mouth 2 (two) times daily. 01/03/20 04/02/20  Loel Dubonnet, NP  ?Continuous Blood Gluc Sensor (DEXCOM G6 SENSOR) MISC SMARTSIG:1 Topical Every 10 Days 11/01/21   [provider]  ?Continuous Blood Gluc Transmit (DEXCOM G6 TRANSMITTER) MISC See admin instructions. 10/05/21   [provider]  ?diltiazem (CARDIZEM CD) 180 MG 24 hr capsule Take 180 mg by mouth daily. 11/26/21   [provider]  ?Cleda Clarks 200 UNIT/ML KwikPen SMARTSIG:Unit(s) SUB-Q PRN 11/28/21   [provider]  ?ibuprofen (ADVIL) 800 MG tablet Take 800 mg by mouth every 6 (six) hours as needed. 11/22/21   [provider]  ?insulin NPH-regular Human (70-30)  100 UNIT/ML injection Inject 20 Units into the skin 2 (two) times daily with a meal.    [provider]  ?meloxicam (MOBIC) 15 MG tablet Take 1 tablet (15 mg total) by mouth daily. 02/09/22   Hyatt, Max T, DPM  ?metFORMIN (GLUCOPHAGE) 1000 MG tablet Take 1,000 mg by mouth 2 (two) times daily with a meal.    [provider]  ?methylPREDNISolone (MEDROL DOSEPAK) 4 MG TBPK tablet 6 day dose pack - take as directed 02/09/22   Hyatt, Max T, DPM  ?topiramate (TOPAMAX) 25 MG tablet Take by mouth. 10/30/21   [provider]  ?Obie Dredge 300 UNIT/ML Solostar Pen Inject into the skin. 09/30/21   [provider]  ?valsartan (DIOVAN) 320 MG tablet Take 320 mg by mouth daily. 11/26/21   [provider]  ?VITAMIN D PO Take by mouth daily.    [provider]  ?   ? ?Allergies    ?Hydrocodone and Influenza virus vaccine   ? ?Review of Systems   ?Review of Systems ? ?Physical Exam ?Updated Vital Signs ?BP (!) 165/93   Pulse 70   Temp 97.9 ?F (36.6 ?C)   Resp 16   Ht '5\' 5"'$  (1.651 m)   Wt 106.1 kg   LMP 04/03/2018   SpO2 95%   BMI 38.94 kg/m?  ?Physical Exam ?Vitals and nursing note reviewed.  ?Constitutional:   ?   General: She is not in acute  distress. ?   Appearance: Normal appearance. She is not ill-appearing, toxic-appearing or diaphoretic.  ?HENT:  ?   Head: Normocephalic.  ?Eyes:  ?   Conjunctiva/sclera: Conjunctivae normal.  ?Cardiovascular:  ?   Rate and Rhythm: Normal rate and regular rhythm.  ?   Pulses: Normal pulses.  ?Pulmonary:  ?   Effort: Pulmonary effort is normal. No respiratory distress.  ?Abdominal:  ?   General: There is no distension.  ?   Palpations: Abdomen is soft.  ?   Tenderness: There is no abdominal tenderness. There is no guarding.  ?Musculoskeletal:     ?   General: No deformity or signs of injury.  ?   Cervical back: No rigidity.  ?Skin: ?   General: Skin is warm and dry.  ?   Coloration: Skin is not jaundiced or pale.  ?Neurological:  ?    General: No focal deficit present.  ?   Mental Status: She is alert and oriented to person, place, and time.  ? ? ?ED Results / Procedures / Treatments   ?Labs ?(all labs ordered are listed, but only abnormal results are displayed) ?Labs Reviewed  ?BASIC METABOLIC PANEL - Abnormal; Notable for the following components:  ?    Result Value  ? Potassium 3.2 (*)   ? Chloride 97 (*)   ? CO2 34 (*)   ? Glucose, Bld 231 (*)   ? BUN 21 (*)   ? Creatinine, Ser 1.32 (*)   ? Calcium 11.1 (*)   ? GFR, Estimated 49 (*)   ? All other components within normal limits  ?CBC - Abnormal; Notable for the following components:  ? RBC 6.32 (*)   ? MCV 72.0 (*)   ? MCH 22.0 (*)   ? All other components within normal limits  ?URINALYSIS, ROUTINE W REFLEX MICROSCOPIC - Abnormal; Notable for the following components:  ? Specific Gravity, Urine 1.040 (*)   ? Glucose, UA >1,000 (*)   ? Protein, ur 30 (*)   ? All other components within normal limits  ?CBG MONITORING, ED - Abnormal; Notable for the following components:  ? Glucose-Capillary 208 (*)   ? All other components within normal limits  ?PREGNANCY, URINE  ?CBG MONITORING, ED  ? ? ?EKG ?EKG Interpretation ? ?Date/Time:  Friday April 01 2022 20:32:08 EDT ?Ventricular Rate:  70 ?PR Interval:  180 ?QRS Duration: 98 ?QT Interval:  416 ?QTC Calculation: 449 ?R Axis:   66 ?Text Interpretation: Sinus rhythm Biatrial enlargement LVH with secondary repolarization abnormality Since prior ECG, more pronounced repolarization abnormality/TW changes diffusely Confirmed by Gareth Morgan [phone]) on 04/01/2022 9:26:34 PM ? ?Radiology ?No results found. ? ?Procedures ?Procedures  ? ? ?Medications Ordered in ED ?Medications  ?lactated ringers bolus 1,000 mL (0 mLs Intravenous Stopped 04/01/22 2141)  ? ? ?ED Course/ Medical Decision Making/ A&P ?  ?                        ?Medical Decision Making ?Amount and/or Complexity of Data Reviewed ?Labs: ordered. ? ?Risk ?Prescription drug management. ? ? ?51yo  female with history of anemia, DM, htn, presents with concern for diarrhea and hyperglycemia.   ? ?Labs personally reviewed and interpreted by me. Glucose improved in ED after treating self at home. No other sign of DKA or significant electrolyte abnormalities. Given K for mild hypokalemia.   ? ?Abdominal exam benign and doubt diverticulitis, appendicitis, SBO. ? ?Diarrhea most likely secondary to viral  gastroenteritis and is improving today.  Recently on abx 2 weeks ago and consider CDiff however did not have BM while in ED, diarrhea decreasing, and cdiff appears less likely.   ? ?Give IV fluids and oral K replacement. Stable for continued outpatient management of glucose and supportive care for gastroenteritis. ? ? ? ? ? ? ? ?Final Clinical Impression(s) / ED Diagnoses ?Final diagnoses:  ?Diarrhea of presumed infectious origin  ?Hyperglycemia  ? ? ?Rx / DC Orders ?ED Discharge Orders   ? ?      Ordered  ?  ondansetron (ZOFRAN-ODT) 4 MG disintegrating tablet  Every 8 hours PRN       ? 04/01/22 2127  ? ?  ?  ? ?  ? ? ?  ?Gareth Morgan, MD ?04/02/22 1208 ? ?

## 2022-04-07 ENCOUNTER — Encounter (HOSPITAL_BASED_OUTPATIENT_CLINIC_OR_DEPARTMENT_OTHER): Payer: Self-pay

## 2022-04-07 ENCOUNTER — Emergency Department (HOSPITAL_BASED_OUTPATIENT_CLINIC_OR_DEPARTMENT_OTHER)
Admission: EM | Admit: 2022-04-07 | Discharge: 2022-04-07 | Disposition: A | Discharge status: Home / Self Care | Payer: Commercial Managed Care - PPO | Attending: Emergency Medicine | Admitting: Emergency Medicine

## 2022-04-07 DIAGNOSIS — R739 Hyperglycemia, unspecified: Secondary | ICD-10-CM | POA: Insufficient documentation

## 2022-04-07 DIAGNOSIS — Z794 Long term (current) use of insulin: Secondary | ICD-10-CM | POA: Insufficient documentation

## 2022-04-07 DIAGNOSIS — Z79899 Other long term (current) drug therapy: Secondary | ICD-10-CM | POA: Insufficient documentation

## 2022-04-07 DIAGNOSIS — R7989 Other specified abnormal findings of blood chemistry: Secondary | ICD-10-CM

## 2022-04-07 LAB — CBC
HCT: 42.7 % (ref 36.0–46.0)
Hemoglobin: 13.2 g/dL (ref 12.0–15.0)
MCH: 22.1 pg — ABNORMAL LOW (ref 26.0–34.0)
MCHC: 30.9 g/dL (ref 30.0–36.0)
MCV: 71.4 fL — ABNORMAL LOW (ref 80.0–100.0)
Platelets: 187 10*3/uL (ref 150–400)
RBC: 5.98 MIL/uL — ABNORMAL HIGH (ref 3.87–5.11)
RDW: 13.3 % (ref 11.5–15.5)
WBC: 4.9 10*3/uL (ref 4.0–10.5)
nRBC: 0 % (ref 0.0–0.2)

## 2022-04-07 LAB — URINALYSIS, ROUTINE W REFLEX MICROSCOPIC
Bilirubin Urine: NEGATIVE
Glucose, UA: >1000 mg/dL — AB
Hgb urine dipstick: NEGATIVE
Ketones, ur: NEGATIVE mg/dL
Leukocytes,Ua: NEGATIVE
Nitrite: NEGATIVE
Specific Gravity, Urine: 1.02 (ref 1.005–1.030)
pH: 5 (ref 5.0–8.0)

## 2022-04-07 LAB — CBG MONITORING, ED
Glucose-Capillary: 206 mg/dL — ABNORMAL HIGH (ref 70–99)
Glucose-Capillary: 84 mg/dL (ref 70–99)

## 2022-04-07 LAB — BASIC METABOLIC PANEL
Anion gap: 10 (ref 5–15)
BUN: 45 mg/dL — ABNORMAL HIGH (ref 6–20)
CO2: 31 mmol/L (ref 22–32)
Calcium: 10.1 mg/dL (ref 8.9–10.3)
Chloride: 98 mmol/L (ref 98–111)
Creatinine, Ser: 1.55 mg/dL — ABNORMAL HIGH (ref 0.44–1.00)
GFR, Estimated: 41 mL/min — ABNORMAL LOW (ref >60)
Glucose, Bld: 211 mg/dL — ABNORMAL HIGH (ref 70–99)
Potassium: 3.1 mmol/L — ABNORMAL LOW (ref 3.5–5.1)
Sodium: 139 mmol/L (ref 135–145)

## 2022-04-07 LAB — PREGNANCY, URINE: Preg Test, Ur: NEGATIVE

## 2022-04-07 NOTE — ED Notes (Signed)
Dc instructions reviewed with pt no questions or concerns at this time. Will follow up with pcp tomorrow regarding metformin. Decline wheelchair and ambulated without difficulty out of department ?

## 2022-04-07 NOTE — ED Triage Notes (Signed)
Presents POV with concerns of elevated blood sugar. Pt reports it's been high for a while. Seen here for the same last week.  ? ?Pt reports taking 42 units TJO this am for blood sugar of 391 ?566 before lunch, 30 units Insulin  ? ?Pt denies any new medications other than her BP meds that were changed on Monday  ?

## 2022-04-07 NOTE — Discharge Instructions (Signed)
Please call your doctor in the morning and discuss restarting your metformin ?Please let your doctor know that your creatinine is increased to 1.55 tonight. ?Return to the emergency department if you are having worsening symptoms at any time. ?

## 2022-04-07 NOTE — ED Provider Notes (Signed)
?[facility] ?Provider Note ? ? ?CSN: [id number] ?Arrival date & time: 04/07/22  1548 ? ?  ? ?History ? ?Chief Complaint  ?Patient presents with  ? Hyperglycemia  ? ? ?Zhoey Blackstock [place] is a 51 y.o. female. ? ?HPI ? ?  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  She states she has been losing weight to control bs.  BS has been running high since February.  Her doctor has increased her insulin.  Primary is Windell Hummingbird at [facility].  Patient seen here 4/28 with diarrhea and bs high at that time.  Patient stopped taking her metformin due to bs being high.  Diarrhea just occurred that one time.   ?Patient has had increasing creatinine and has had bp meds changed and reports that she has an [place] scheduled to check her kidneys. ? ? ?Home Medications ?Prior to Admission medications   ?Medication Sig Start Date End Date Taking? Authorizing Provider  ?diltiazem (CARDIZEM CD) 180 MG 24 hr capsule Take 180 mg by mouth daily. 11/26/21  Yes [provider]  ?Olmesartan-amLODIPine-HCTZ 40-10-25 MG TABS Take 1 tablet by mouth daily. 04/04/22  Yes [provider]  ?Ascorbic Acid (VITAMIN C) 1000 MG tablet Take 1,000 mg by mouth daily.    [provider]  ?carvedilol (COREG) 25 MG tablet Take 1 tablet (25 mg total) by mouth 2 (two) times daily. 01/03/20 04/02/20  Loel Dubonnet, NP  ?Continuous Blood Gluc Sensor (DEXCOM G6 SENSOR) MISC SMARTSIG:1 Topical Every 10 Days 11/01/21   [provider]  ?Continuous Blood Gluc Transmit (DEXCOM G6 TRANSMITTER) MISC See admin instructions. 10/05/21   [provider]  ?Cleda Clarks 200 UNIT/ML KwikPen SMARTSIG:Unit(s) SUB-Q PRN 11/28/21   [provider]  ?ibuprofen (ADVIL) 800 MG tablet Take 800 mg by mouth every 6 (six) hours as needed. 11/22/21   [provider]  ?insulin NPH-regular Human (70-30) 100 UNIT/ML injection Inject 20 Units into the skin 2 (two) times daily with a meal.    [provider]   ?meloxicam (MOBIC) 15 MG tablet Take 1 tablet (15 mg total) by mouth daily. 02/09/22   Hyatt, Max T, DPM  ?metFORMIN (GLUCOPHAGE) 1000 MG tablet Take 1,000 mg by mouth 2 (two) times daily with a meal.    [provider]  ?methylPREDNISolone (MEDROL DOSEPAK) 4 MG TBPK tablet 6 day dose pack - take as directed 02/09/22   Hyatt, Max T, DPM  ?ondansetron (ZOFRAN-ODT) 4 MG disintegrating tablet Take 1 tablet (4 mg total) by mouth every 8 (eight) hours as needed for nausea or vomiting. 04/01/22   Gareth Morgan, MD  ?topiramate (TOPAMAX) 25 MG tablet Take by mouth. 10/30/21   [provider]  ?Obie Dredge 300 UNIT/ML Solostar Pen Inject into the skin. 09/30/21   [provider]  ?VITAMIN D PO Take by mouth daily.    [provider]  ?   ? ?Allergies    ?Hydrocodone and Influenza virus vaccine   ? ?Review of Systems   ?Review of Systems ? ?Physical Exam ?Updated Vital Signs ?BP (!) 134/92   Pulse (!) 58   Temp 98.6 ?F (37 ?C)   Resp 18   LMP 04/03/2018   SpO2 97%  ?Physical Exam ? ?ED Results / Procedures / Treatments   ?Labs ?(all labs ordered are listed, but only abnormal results are displayed) ?Labs Reviewed  ?BASIC METABOLIC PANEL - Abnormal; Notable for the following components:  ?    Result Value  ?  Potassium 3.1 (*)   ? Glucose, Bld 211 (*)   ? BUN 45 (*)   ? Creatinine, Ser 1.55 (*)   ? GFR, Estimated 41 (*)   ? All other components within normal limits  ?CBC - Abnormal; Notable for the following components:  ? RBC 5.98 (*)   ? MCV 71.4 (*)   ? MCH 22.1 (*)   ? All other components within normal limits  ?URINALYSIS, ROUTINE W REFLEX MICROSCOPIC - Abnormal; Notable for the following components:  ? Glucose, UA >1,000 (*)   ? Protein, ur TRACE (*)   ? All other components within normal limits  ?CBG MONITORING, ED - Abnormal; Notable for the following components:  ? Glucose-Capillary 206 (*)   ? All other components within normal limits  ?PREGNANCY, URINE  ?CBG MONITORING, ED   ? ? ?EKG ?None ? ?Radiology ?No results found. ? ?Procedures ?Procedures  ? ? ?Medications Ordered in ED ?Medications - No data to display ? ?ED Course/ Medical Decision Making/ A&P ?  ?                        ?Medical Decision Making ?51 year old female presents today with elevated blood sugars.  She had stopped her metformin approximately a month ago when she noted that her blood sugars were elevated.  She has remained on her 6 insulin.  However her doctor has increased her sliding scale.  She continues to have elevated blood sugars at home.  Additionally they noted that her creatinines have been creeping up and she is scheduled for a kidney ultrasound.  On my evaluation here tonight her blood sugar is 211 there is no evidence of DKA.  Advised that she could restart her metformin. ?Also advised that her creatinine is increased to 1.55 and that she should have this rechecked with her doctor in the next 1 to 3 days. ?Patient appears stable for discharge ? ?Amount and/or Complexity of Data Reviewed ?Labs: ordered. ? ? ? ? ? ? ? ? ? ?Final Clinical Impression(s) / ED Diagnoses ?Final diagnoses:  ?Hyperglycemia  ?Blood creatinine increased compared with prior measurement  ? ? ?Rx / DC Orders ?ED Discharge Orders   ? ? None  ? ?  ? ? ?  ?Pattricia Boss, MD ?04/07/22 2205 ? ?

## 2022-04-27 ENCOUNTER — Ambulatory Visit: Payer: Commercial Managed Care - PPO | Admitting: Podiatry

## 2022-06-13 ENCOUNTER — Ambulatory Visit: Payer: Commercial Managed Care - PPO | Admitting: Internal Medicine

## 2022-11-20 ENCOUNTER — Emergency Department (HOSPITAL_BASED_OUTPATIENT_CLINIC_OR_DEPARTMENT_OTHER)
Admission: EM | Admit: 2022-11-20 | Discharge: 2022-11-20 | Disposition: A | Discharge status: Home / Self Care | Payer: No Typology Code available for payment source | Attending: Emergency Medicine | Admitting: Emergency Medicine

## 2022-11-20 ENCOUNTER — Emergency Department (HOSPITAL_BASED_OUTPATIENT_CLINIC_OR_DEPARTMENT_OTHER): Payer: No Typology Code available for payment source | Admitting: Radiology

## 2022-11-20 ENCOUNTER — Encounter (HOSPITAL_BASED_OUTPATIENT_CLINIC_OR_DEPARTMENT_OTHER): Payer: Self-pay | Admitting: Emergency Medicine

## 2022-11-20 ENCOUNTER — Other Ambulatory Visit: Payer: Self-pay

## 2022-11-20 DIAGNOSIS — R0981 Nasal congestion: Secondary | ICD-10-CM | POA: Diagnosis present

## 2022-11-20 DIAGNOSIS — Z794 Long term (current) use of insulin: Secondary | ICD-10-CM | POA: Insufficient documentation

## 2022-11-20 DIAGNOSIS — R739 Hyperglycemia, unspecified: Secondary | ICD-10-CM

## 2022-11-20 DIAGNOSIS — J069 Acute upper respiratory infection, unspecified: Secondary | ICD-10-CM | POA: Insufficient documentation

## 2022-11-20 DIAGNOSIS — Z1152 Encounter for screening for COVID-19: Secondary | ICD-10-CM | POA: Diagnosis not present

## 2022-11-20 LAB — CBG MONITORING, ED
Glucose-Capillary: 462 mg/dL — ABNORMAL HIGH (ref 70–99)
Glucose-Capillary: 398 mg/dL — ABNORMAL HIGH (ref 70–99)
Glucose-Capillary: 314 mg/dL — ABNORMAL HIGH (ref 70–99)

## 2022-11-20 LAB — CBC
HCT: 41.3 % (ref 36.0–46.0)
Hemoglobin: 13 g/dL (ref 12.0–15.0)
MCH: 22.1 pg — ABNORMAL LOW (ref 26.0–34.0)
MCHC: 31.5 g/dL (ref 30.0–36.0)
MCV: 70.2 fL — ABNORMAL LOW (ref 80.0–100.0)
Platelets: 168 10*3/uL (ref 150–400)
RBC: 5.88 MIL/uL — ABNORMAL HIGH (ref 3.87–5.11)
RDW: 13.2 % (ref 11.5–15.5)
WBC: 5 10*3/uL (ref 4.0–10.5)
nRBC: 0 % (ref 0.0–0.2)

## 2022-11-20 LAB — URINALYSIS, ROUTINE W REFLEX MICROSCOPIC
Bilirubin Urine: NEGATIVE
Glucose, UA: >1000 mg/dL — AB
Hgb urine dipstick: NEGATIVE
Ketones, ur: NEGATIVE mg/dL
Leukocytes,Ua: NEGATIVE
Nitrite: NEGATIVE
Specific Gravity, Urine: 1.037 — ABNORMAL HIGH (ref 1.005–1.030)
pH: 5 (ref 5.0–8.0)

## 2022-11-20 LAB — RESP PANEL BY RT-PCR (RSV, FLU A&B, COVID)  RVPGX2
Influenza A by PCR: NEGATIVE
Influenza B by PCR: NEGATIVE
Resp Syncytial Virus by PCR: NEGATIVE
SARS Coronavirus 2 by RT PCR: NEGATIVE

## 2022-11-20 LAB — BASIC METABOLIC PANEL
Anion gap: 10 (ref 5–15)
BUN: 19 mg/dL (ref 6–20)
CO2: 30 mmol/L (ref 22–32)
Calcium: 9.4 mg/dL (ref 8.9–10.3)
Chloride: 94 mmol/L — ABNORMAL LOW (ref 98–111)
Creatinine, Ser: 1.16 mg/dL — ABNORMAL HIGH (ref 0.44–1.00)
GFR, Estimated: 57 mL/min — ABNORMAL LOW (ref >60)
Glucose, Bld: 642 mg/dL (ref 70–99)
Potassium: 3.9 mmol/L (ref 3.5–5.1)
Sodium: 134 mmol/L — ABNORMAL LOW (ref 135–145)

## 2022-11-20 MED ORDER — FLUCONAZOLE 150 MG PO TABS
150.0000 mg | ORAL_TABLET | Freq: Once | ORAL | Status: AC
  Administered 2022-11-20: 150 mg via ORAL
  Filled 2022-11-20: qty 1

## 2022-11-20 MED ORDER — SODIUM CHLORIDE 0.9 % IV BOLUS
1000.0000 mL | Freq: Once | INTRAVENOUS | Status: AC
  Administered 2022-11-20: 1000 mL via INTRAVENOUS

## 2022-11-20 MED ORDER — INSULIN ASPART 100 UNIT/ML IJ SOLN
5.0000 [IU] | Freq: Once | INTRAMUSCULAR | Status: AC
  Administered 2022-11-20: 5 [IU] via SUBCUTANEOUS

## 2022-11-20 MED ORDER — FLUCONAZOLE 200 MG PO TABS: 200.0000 mg | ORAL_TABLET | Freq: Once | ORAL | Status: DC

## 2022-11-20 NOTE — Discharge Instructions (Signed)
Check your blood sugars frequently at home and make sure that you are taking your medications as directed.  Follow-up on Friday with your primary care doctor to reassess your diabetes management.  Return to the emergency room if you have any worsening symptoms.

## 2022-11-20 NOTE — ED Notes (Signed)
Cbg 642

## 2022-11-20 NOTE — ED Notes (Signed)
CRITICAL VALUE STICKER  CRITICAL VALUE:glucose  RECEIVER (on-site recipient of call):Pearle Wandler,RN  DATE & TIME NOTIFIED: 11/20/2022 1035  MESSENGER (representative from lab):  MD NOTIFIED: belfi  TIME OF NOTIFICATION:1045  RESPONSE:

## 2022-11-20 NOTE — ED Provider Notes (Signed)
[facility] Provider Note   CSN: [id number] Arrival date & time: 11/20/22  0831     History  Chief Complaint  Patient presents with   multiple complaints    Grace Gallagher is a 51 y.o. female.  Patient is a 51 year old female who presents with a 1 week history of runny nose and nasal congestion.  She is also had a bit of a coughing.  She says that her blood sugars have been elevated for the last several days.  She says she has been taking her medications regularly.  She denies any nausea vomiting.  No diarrhea.  No chest pain or shortness of breath.  No known fevers.  She also says that when her glucose gets elevated she always gets a yeast infection and she feels like she has a yeast infection illness requesting a dose of Diflucan.       Home Medications Prior to Admission medications   Medication Sig Start Date End Date Taking? Authorizing Provider  Ascorbic Acid (VITAMIN C) 1000 MG tablet Take 1,000 mg by mouth daily.    [provider]  carvedilol (COREG) 25 MG tablet Take 1 tablet (25 mg total) by mouth 2 (two) times daily. 01/03/20 04/02/20  Loel Dubonnet, NP  Continuous Blood Gluc Sensor (DEXCOM G6 SENSOR) MISC SMARTSIG:1 Topical Every 10 Days 11/01/21   [provider]  Continuous Blood Gluc Transmit (DEXCOM G6 TRANSMITTER) MISC See admin instructions. 10/05/21   [provider]  diltiazem (CARDIZEM CD) 180 MG 24 hr capsule Take 180 mg by mouth daily. 11/26/21   [provider]  HUMALOG KWIKPEN 200 UNIT/ML KwikPen SMARTSIG:Unit(s) SUB-Q PRN 11/28/21   [provider]  ibuprofen (ADVIL) 800 MG tablet Take 800 mg by mouth every 6 (six) hours as needed. 11/22/21   [provider]  insulin NPH-regular Human (70-30) 100 UNIT/ML injection Inject 20 Units into the skin 2 (two) times daily with a meal.    [provider]  meloxicam (MOBIC) 15 MG tablet Take 1 tablet (15 mg total) by mouth  daily. 02/09/22   Hyatt, Max T, DPM  metFORMIN (GLUCOPHAGE) 1000 MG tablet Take 1,000 mg by mouth 2 (two) times daily with a meal.    [provider]  methylPREDNISolone (MEDROL DOSEPAK) 4 MG TBPK tablet 6 day dose pack - take as directed 02/09/22   Hyatt, Max T, DPM  Olmesartan-amLODIPine-HCTZ 40-10-25 MG TABS Take 1 tablet by mouth daily. 04/04/22   [provider]  ondansetron (ZOFRAN-ODT) 4 MG disintegrating tablet Take 1 tablet (4 mg total) by mouth every 8 (eight) hours as needed for nausea or vomiting. 04/01/22   Gareth Morgan, MD  topiramate (TOPAMAX) 25 MG tablet Take by mouth. 10/30/21   [provider]  TOUJEO SOLOSTAR 300 UNIT/ML Solostar Pen Inject into the skin. 09/30/21   [provider]  VITAMIN D PO Take by mouth daily.    [provider]      Allergies    Hydrocodone and Influenza virus vaccine    Review of Systems   Review of Systems  Constitutional:  Positive for fatigue. Negative for chills, diaphoresis and fever.  HENT:  Positive for congestion and rhinorrhea. Negative for sneezing.   Eyes: Negative.   Respiratory:  Positive for cough. Negative for chest tightness and shortness of breath.   Cardiovascular:  Negative for chest pain and leg swelling.  Gastrointestinal:  Negative for abdominal pain, blood in stool, diarrhea, nausea and vomiting.  Genitourinary:  Positive for vaginal pain. Negative for difficulty urinating, flank pain, frequency and hematuria.  Musculoskeletal:  Negative for arthralgias and back pain.  Skin:  Negative for rash.  Neurological:  Negative for dizziness, speech difficulty, weakness, numbness and headaches.    Physical Exam Updated Vital Signs BP (!) 170/97   Pulse 84   Temp 98.1 F (36.7 C) (Oral)   Resp 14   LMP 04/03/2018   SpO2 93%  Physical Exam Constitutional:      Appearance: She is well-developed. She is obese.  HENT:     Head: Normocephalic and atraumatic.  Eyes:     Pupils:  Pupils are equal, round, and reactive to light.  Cardiovascular:     Rate and Rhythm: Normal rate and regular rhythm.     Heart sounds: Normal heart sounds.  Pulmonary:     Effort: Pulmonary effort is normal. No respiratory distress.     Breath sounds: Normal breath sounds. No wheezing or rales.  Chest:     Chest wall: No tenderness.  Abdominal:     General: Bowel sounds are normal.     Palpations: Abdomen is soft.     Tenderness: There is no abdominal tenderness. There is no guarding or rebound.  Musculoskeletal:        General: Normal range of motion.     Cervical back: Normal range of motion and neck supple.  Lymphadenopathy:     Cervical: No cervical adenopathy.  Skin:    General: Skin is warm and dry.     Findings: No rash.  Neurological:     Mental Status: She is alert and oriented to person, place, and time.     ED Results / Procedures / Treatments   Labs (all labs ordered are listed, but only abnormal results are displayed) Labs Reviewed  BASIC METABOLIC PANEL - Abnormal; Notable for the following components:      Result Value   Sodium 134 (*)    Chloride 94 (*)    Glucose, Bld 642 (*)    Creatinine, Ser 1.16 (*)    GFR, Estimated 57 (*)    All other components within normal limits  CBC - Abnormal; Notable for the following components:   RBC 5.88 (*)    MCV 70.2 (*)    MCH 22.1 (*)    All other components within normal limits  URINALYSIS, ROUTINE W REFLEX MICROSCOPIC - Abnormal; Notable for the following components:   Specific Gravity, Urine 1.037 (*)    Glucose, UA >1,000 (*)    Protein, ur TRACE (*)    Bacteria, UA RARE (*)    All other components within normal limits  CBG MONITORING, ED - Abnormal; Notable for the following components:   Glucose-Capillary 462 (*)    All other components within normal limits  CBG MONITORING, ED - Abnormal; Notable for the following components:   Glucose-Capillary 398 (*)    All other components within normal limits   CBG MONITORING, ED - Abnormal; Notable for the following components:   Glucose-Capillary 314 (*)    All other components within normal limits  RESP PANEL BY RT-PCR (RSV, FLU A&B, COVID)  [id number]  CBG MONITORING, ED    EKG None  Radiology DG Chest 2 View  Result Date: 11/20/2022 CLINICAL DATA:  Cough and congestion EXAM: CHEST - 2 VIEW COMPARISON:  Chest x-ray December 21, 2019 FINDINGS: The cardiomediastinal silhouette is unchanged in contour. Chronic bibasilar linear opacities, likely atelectasis/scarring. No new focal pulmonary opacity. No pleural  effusion or pneumothorax. The visualized upper abdomen is unremarkable. No acute osseous abnormality. IMPRESSION: No acute cardiopulmonary abnormality. Electronically Signed   By: Beryle Flock M.D.   On: 11/20/2022 12:16    Procedures Procedures    Medications Ordered in ED Medications  sodium chloride 0.9 % bolus 1,000 mL (0 mLs Intravenous Stopped 11/20/22 1159)  insulin aspart (novoLOG) injection 5 Units (5 Units Subcutaneous Given 11/20/22 1213)  fluconazole (DIFLUCAN) tablet 150 mg (150 mg Oral Given 11/20/22 1214)  sodium chloride 0.9 % bolus 1,000 mL ( Intravenous Stopped 11/20/22 1528)  insulin aspart (novoLOG) injection 5 Units (5 Units Subcutaneous Given 11/20/22 1435)    ED Course/ Medical Decision Making/ A&P                           Medical Decision Making Amount and/or Complexity of Data Reviewed Labs: ordered. Radiology: ordered.  Risk Prescription drug management.   Patient is a 51 year old female who presents with elevated blood sugars and URI symptoms.  Her COVID/flu/RSV test were negative.  Chest x-ray two-view was performed which shows no evidence of pneumonia.  This was interpreted by me and confirmed by the radiologist.  On her labs, her blood glucose is markedly elevated in the 600 range.  She does not have any signs of acidosis/DKA.  Her urinalysis is not consistent with infection.  She was given IV  fluids and insulin.  Her glucose came down nicely.  She does not have any vomiting or other symptoms associated with this.  Her blood pressure is a bit on the high side.  I encouraged her to check this at home and make sure that she is taking her medications regularly.  She has an appointment to follow-up with her primary care doctor on Friday.  Return precautions were given.  Final Clinical Impression(s) / ED Diagnoses Final diagnoses:  Viral upper respiratory tract infection  Hyperglycemia    Rx / DC Orders ED Discharge Orders     None         Malvin Johns, MD 11/20/22 1536

## 2022-11-20 NOTE — ED Triage Notes (Signed)
Congested/runny nose /for about 2 weeks. Her blood pressure and blood sugar have been elevated for about 6 days, has been able to take her meds. Also feels like might have yeast infection from sugar being high.

## 2023-01-31 ENCOUNTER — Other Ambulatory Visit: Payer: Self-pay

## 2023-01-31 ENCOUNTER — Encounter (HOSPITAL_BASED_OUTPATIENT_CLINIC_OR_DEPARTMENT_OTHER): Payer: Self-pay

## 2023-01-31 ENCOUNTER — Emergency Department (HOSPITAL_BASED_OUTPATIENT_CLINIC_OR_DEPARTMENT_OTHER)
Admission: EM | Admit: 2023-01-31 | Discharge: 2023-02-01 | Disposition: A | Discharge status: Home / Self Care | Payer: 59 | Attending: Emergency Medicine | Admitting: Emergency Medicine

## 2023-01-31 DIAGNOSIS — E1165 Type 2 diabetes mellitus with hyperglycemia: Secondary | ICD-10-CM | POA: Insufficient documentation

## 2023-01-31 DIAGNOSIS — Z7984 Long term (current) use of oral hypoglycemic drugs: Secondary | ICD-10-CM | POA: Diagnosis not present

## 2023-01-31 DIAGNOSIS — Z794 Long term (current) use of insulin: Secondary | ICD-10-CM | POA: Diagnosis not present

## 2023-01-31 DIAGNOSIS — R739 Hyperglycemia, unspecified: Secondary | ICD-10-CM

## 2023-01-31 DIAGNOSIS — I10 Essential (primary) hypertension: Secondary | ICD-10-CM | POA: Diagnosis not present

## 2023-01-31 LAB — URINALYSIS, ROUTINE W REFLEX MICROSCOPIC
Bacteria, UA: NONE SEEN
Bilirubin Urine: NEGATIVE
Glucose, UA: >1000 mg/dL — AB
Hgb urine dipstick: NEGATIVE
Ketones, ur: NEGATIVE mg/dL
Leukocytes,Ua: NEGATIVE
Nitrite: NEGATIVE
Protein, ur: NEGATIVE mg/dL
Specific Gravity, Urine: 1.033 — ABNORMAL HIGH (ref 1.005–1.030)
pH: 6.5 (ref 5.0–8.0)

## 2023-01-31 LAB — BASIC METABOLIC PANEL
Anion gap: 8 (ref 5–15)
BUN: 15 mg/dL (ref 6–20)
CO2: 30 mmol/L (ref 22–32)
Calcium: 10.1 mg/dL (ref 8.9–10.3)
Chloride: 96 mmol/L — ABNORMAL LOW (ref 98–111)
Creatinine, Ser: 0.98 mg/dL (ref 0.44–1.00)
GFR, Estimated: >60 mL/min (ref >60)
Glucose, Bld: 620 mg/dL (ref 70–99)
Potassium: 3.9 mmol/L (ref 3.5–5.1)
Sodium: 134 mmol/L — ABNORMAL LOW (ref 135–145)

## 2023-01-31 LAB — CBC
HCT: 41.9 % (ref 36.0–46.0)
Hemoglobin: 13.1 g/dL (ref 12.0–15.0)
MCH: 22.1 pg — ABNORMAL LOW (ref 26.0–34.0)
MCHC: 31.3 g/dL (ref 30.0–36.0)
MCV: 70.5 fL — ABNORMAL LOW (ref 80.0–100.0)
Platelets: 208 10*3/uL (ref 150–400)
RBC: 5.94 MIL/uL — ABNORMAL HIGH (ref 3.87–5.11)
RDW: 13.9 % (ref 11.5–15.5)
WBC: 5.2 10*3/uL (ref 4.0–10.5)
nRBC: 0 % (ref 0.0–0.2)

## 2023-01-31 LAB — PREGNANCY, URINE: Preg Test, Ur: NEGATIVE

## 2023-01-31 LAB — CBG MONITORING, ED: Glucose-Capillary: 584 mg/dL (ref 70–99)

## 2023-01-31 MED ORDER — LACTATED RINGERS IV BOLUS
1000.0000 mL | Freq: Once | INTRAVENOUS | Status: AC
  Administered 2023-01-31: 1000 mL via INTRAVENOUS

## 2023-01-31 NOTE — ED Triage Notes (Signed)
Pt states her sugar's been running high x "a few days," endorses L sided flank/ back pain onset today. Dry mouth, denies NV, states that glucometer at home "sometimes won't read because it's too high."

## 2023-02-01 LAB — CBG MONITORING, ED
Glucose-Capillary: 384 mg/dL — ABNORMAL HIGH (ref 70–99)
Glucose-Capillary: 258 mg/dL — ABNORMAL HIGH (ref 70–99)

## 2023-02-01 LAB — BETA-HYDROXYBUTYRIC ACID: Beta-Hydroxybutyric Acid: 0.27 mmol/L (ref 0.05–0.27)

## 2023-02-01 MED ORDER — SODIUM CHLORIDE 0.9 % IV BOLUS
1000.0000 mL | Freq: Once | INTRAVENOUS | Status: AC
  Administered 2023-02-01: 1000 mL via INTRAVENOUS

## 2023-02-01 MED ORDER — INSULIN ASPART 100 UNIT/ML IV SOLN
10.0000 [IU] | Freq: Once | INTRAVENOUS | Status: AC
  Administered 2023-02-01: 10 [IU] via INTRAVENOUS

## 2023-02-01 NOTE — ED Notes (Signed)
MD evaluating patient

## 2023-02-01 NOTE — ED Notes (Signed)
Per MD Nathaneil Canary, pt may be discharged with elevated bp but must take home BP meds ASAP. Patient expressed understanding of instructions. Reviewed AVS with patient, patient expressed understanding of directions, denies further questions at this time.

## 2023-02-01 NOTE — Discharge Instructions (Signed)
Continue medications as previously prescribed.  Adhere to a low carbohydrate, low sugar diet.  Keep a record of your blood sugars at home and follow-up the results with your primary doctor to determine whether or not you need medication adjustments.  Return to the emergency department if your symptoms significantly worsen or change.

## 2023-02-01 NOTE — ED Notes (Signed)
Patient resting quietly in stretcher, respirations even, unlabored, no acute distress noted. Denies needs at this time.

## 2023-02-01 NOTE — ED Provider Notes (Signed)
[facility] Provider Note   CSN: [id number] Arrival date & time: 01/31/23  2233     History  Chief Complaint  Patient presents with   Flank Pain   Hyperglycemia    Grace Gallagher is a 52 y.o. female.  Patient is a 52 year old female with past medical history of diabetes, hypertension, hyperlipidemia.  Patient presenting today for evaluation of elevated blood sugar.  For the past several days, patient has felt fatigued, dry mouth, dizzy, and frequent urination.  She reports that her sugars have been reading "high" on her glucometer at home.  She reports being compliant with her diabetic medications.  She denies any fevers, chills, abdominal pain, or other complaints.  She denies any changes in her diet that would explain her elevated sugar.  The history is provided by the patient.       Home Medications Prior to Admission medications   Medication Sig Start Date End Date Taking? Authorizing Provider  Ascorbic Acid (VITAMIN C) 1000 MG tablet Take 1,000 mg by mouth daily.    [provider]  carvedilol (COREG) 25 MG tablet Take 1 tablet (25 mg total) by mouth 2 (two) times daily. 01/03/20 04/02/20  Loel Dubonnet, NP  Continuous Blood Gluc Sensor (DEXCOM G6 SENSOR) MISC SMARTSIG:1 Topical Every 10 Days 11/01/21   [provider]  Continuous Blood Gluc Transmit (DEXCOM G6 TRANSMITTER) MISC See admin instructions. 10/05/21   [provider]  diltiazem (CARDIZEM CD) 180 MG 24 hr capsule Take 180 mg by mouth daily. 11/26/21   [provider]  HUMALOG KWIKPEN 200 UNIT/ML KwikPen SMARTSIG:Unit(s) SUB-Q PRN 11/28/21   [provider]  ibuprofen (ADVIL) 800 MG tablet Take 800 mg by mouth every 6 (six) hours as needed. 11/22/21   [provider]  insulin NPH-regular Human (70-30) 100 UNIT/ML injection Inject 20 Units into the skin 2 (two) times daily with a meal.    [provider]   meloxicam (MOBIC) 15 MG tablet Take 1 tablet (15 mg total) by mouth daily. 02/09/22   Hyatt, Max T, DPM  metFORMIN (GLUCOPHAGE) 1000 MG tablet Take 1,000 mg by mouth 2 (two) times daily with a meal.    [provider]  methylPREDNISolone (MEDROL DOSEPAK) 4 MG TBPK tablet 6 day dose pack - take as directed 02/09/22   Hyatt, Max T, DPM  Olmesartan-amLODIPine-HCTZ 40-10-25 MG TABS Take 1 tablet by mouth daily. 04/04/22   [provider]  ondansetron (ZOFRAN-ODT) 4 MG disintegrating tablet Take 1 tablet (4 mg total) by mouth every 8 (eight) hours as needed for nausea or vomiting. 04/01/22   Gareth Morgan, MD  topiramate (TOPAMAX) 25 MG tablet Take by mouth. 10/30/21   [provider]  TOUJEO SOLOSTAR 300 UNIT/ML Solostar Pen Inject into the skin. 09/30/21   [provider]  VITAMIN D PO Take by mouth daily.    [provider]      Allergies    Hydrocodone and Influenza virus vaccine    Review of Systems   Review of Systems  All other systems reviewed and are negative.   Physical Exam Updated Vital Signs BP (!) 158/108   Pulse (!) 57   Temp 98.7 F (37.1 C)   Resp (!) 24   LMP 04/03/2018   SpO2 95%  Physical Exam Vitals and nursing note reviewed.  Constitutional:      General: She is not in acute distress.    Appearance: She is well-developed.  She is not diaphoretic.  HENT:     Head: Normocephalic and atraumatic.     Mouth/Throat:     Comments: Mucous membranes somewhat dry. Cardiovascular:     Rate and Rhythm: Normal rate and regular rhythm.     Heart sounds: No murmur heard.    No friction rub. No gallop.  Pulmonary:     Effort: Pulmonary effort is normal. No respiratory distress.     Breath sounds: Normal breath sounds. No wheezing.  Abdominal:     General: Bowel sounds are normal. There is no distension.     Palpations: Abdomen is soft.     Tenderness: There is no abdominal tenderness.  Musculoskeletal:        General: Normal  range of motion.     Cervical back: Normal range of motion and neck supple.  Skin:    General: Skin is warm and dry.  Neurological:     General: No focal deficit present.     Mental Status: She is alert and oriented to person, place, and time.     ED Results / Procedures / Treatments   Labs (all labs ordered are listed, but only abnormal results are displayed) Labs Reviewed  BASIC METABOLIC PANEL - Abnormal; Notable for the following components:      Result Value   Sodium 134 (*)    Chloride 96 (*)    Glucose, Bld 620 (*)    All other components within normal limits  CBC - Abnormal; Notable for the following components:   RBC 5.94 (*)    MCV 70.5 (*)    MCH 22.1 (*)    All other components within normal limits  URINALYSIS, ROUTINE W REFLEX MICROSCOPIC - Abnormal; Notable for the following components:   Color, Urine COLORLESS (*)    Specific Gravity, Urine 1.033 (*)    Glucose, UA >1,000 (*)    All other components within normal limits  CBG MONITORING, ED - Abnormal; Notable for the following components:   Glucose-Capillary 584 (*)    All other components within normal limits  PREGNANCY, URINE  BETA-HYDROXYBUTYRIC ACID  CBG MONITORING, ED    EKG None  Radiology No results found.  Procedures Procedures    Medications Ordered in ED Medications  sodium chloride 0.9 % bolus 1,000 mL (has no administration in time range)  sodium chloride 0.9 % bolus 1,000 mL (has no administration in time range)  insulin aspart (novoLOG) injection 10 Units (has no administration in time range)  lactated ringers bolus 1,000 mL (0 mLs Intravenous Stopped 02/01/23 0050)    ED Course/ Medical Decision Making/ A&P  Patient is a 52 year old female with history of diabetes presenting with complaints of elevated blood sugar and weakness.  She arrives here with stable vital signs and physical examination which is basically unremarkable.  Workup initiated including CBC, metabolic panel, and  beta hydroxybutyric acid.  Studies remarkable for a glucose of 620.  There is no evidence for ketoacidosis or other electrolyte abnormality.  Patient given normal saline along with IV NovoLog with good improvement in her blood sugar.  It is now 258 and she is feeling better.  I feel as though patient can safely be discharged with as needed follow-up.  I have advised her to keep a record of her blood sugars and take this to her primary doctor to decide whether or not she needs medication adjustments.  Final Clinical Impression(s) / ED Diagnoses Final diagnoses:  None    Rx / DC  Orders ED Discharge Orders     None         Veryl Speak, MD 02/01/23 [phone]

## 2023-03-15 ENCOUNTER — Other Ambulatory Visit: Payer: Self-pay

## 2023-03-15 ENCOUNTER — Emergency Department (HOSPITAL_BASED_OUTPATIENT_CLINIC_OR_DEPARTMENT_OTHER)
Admission: EM | Admit: 2023-03-15 | Discharge: 2023-03-15 | Disposition: A | Discharge status: Home / Self Care | Payer: 59 | Attending: Emergency Medicine | Admitting: Emergency Medicine

## 2023-03-15 ENCOUNTER — Emergency Department (HOSPITAL_BASED_OUTPATIENT_CLINIC_OR_DEPARTMENT_OTHER): Payer: 59

## 2023-03-15 ENCOUNTER — Encounter (HOSPITAL_BASED_OUTPATIENT_CLINIC_OR_DEPARTMENT_OTHER): Payer: Self-pay

## 2023-03-15 ENCOUNTER — Other Ambulatory Visit (HOSPITAL_BASED_OUTPATIENT_CLINIC_OR_DEPARTMENT_OTHER): Payer: Self-pay

## 2023-03-15 DIAGNOSIS — W109XXA Fall (on) (from) unspecified stairs and steps, initial encounter: Secondary | ICD-10-CM | POA: Insufficient documentation

## 2023-03-15 DIAGNOSIS — E1165 Type 2 diabetes mellitus with hyperglycemia: Secondary | ICD-10-CM | POA: Insufficient documentation

## 2023-03-15 DIAGNOSIS — E878 Other disorders of electrolyte and fluid balance, not elsewhere classified: Secondary | ICD-10-CM | POA: Diagnosis not present

## 2023-03-15 DIAGNOSIS — Z79899 Other long term (current) drug therapy: Secondary | ICD-10-CM | POA: Diagnosis not present

## 2023-03-15 DIAGNOSIS — R300 Dysuria: Secondary | ICD-10-CM | POA: Insufficient documentation

## 2023-03-15 DIAGNOSIS — M25562 Pain in left knee: Secondary | ICD-10-CM | POA: Insufficient documentation

## 2023-03-15 DIAGNOSIS — I10 Essential (primary) hypertension: Secondary | ICD-10-CM | POA: Insufficient documentation

## 2023-03-15 DIAGNOSIS — E860 Dehydration: Secondary | ICD-10-CM | POA: Diagnosis not present

## 2023-03-15 DIAGNOSIS — Z7984 Long term (current) use of oral hypoglycemic drugs: Secondary | ICD-10-CM | POA: Insufficient documentation

## 2023-03-15 DIAGNOSIS — R739 Hyperglycemia, unspecified: Secondary | ICD-10-CM

## 2023-03-15 DIAGNOSIS — E871 Hypo-osmolality and hyponatremia: Secondary | ICD-10-CM | POA: Insufficient documentation

## 2023-03-15 DIAGNOSIS — Z794 Long term (current) use of insulin: Secondary | ICD-10-CM | POA: Diagnosis not present

## 2023-03-15 DIAGNOSIS — R531 Weakness: Secondary | ICD-10-CM | POA: Insufficient documentation

## 2023-03-15 LAB — BASIC METABOLIC PANEL
Anion gap: 9 (ref 5–15)
BUN: 13 mg/dL (ref 6–20)
CO2: 27 mmol/L (ref 22–32)
Calcium: 9.6 mg/dL (ref 8.9–10.3)
Chloride: 95 mmol/L — ABNORMAL LOW (ref 98–111)
Creatinine, Ser: 0.97 mg/dL (ref 0.44–1.00)
GFR, Estimated: >60 mL/min (ref >60)
Glucose, Bld: 693 mg/dL (ref 70–99)
Potassium: 3.9 mmol/L (ref 3.5–5.1)
Sodium: 131 mmol/L — ABNORMAL LOW (ref 135–145)

## 2023-03-15 LAB — WET PREP, GENITAL
Clue Cells Wet Prep HPF POC: NONE SEEN
Sperm: NONE SEEN
Trich, Wet Prep: NONE SEEN
WBC, Wet Prep HPF POC: <10 (ref <10)
Yeast Wet Prep HPF POC: NONE SEEN

## 2023-03-15 LAB — CBC
HCT: 41.3 % (ref 36.0–46.0)
Hemoglobin: 12.8 g/dL (ref 12.0–15.0)
MCH: 22.3 pg — ABNORMAL LOW (ref 26.0–34.0)
MCHC: 31 g/dL (ref 30.0–36.0)
MCV: 71.8 fL — ABNORMAL LOW (ref 80.0–100.0)
Platelets: 161 10*3/uL (ref 150–400)
RBC: 5.75 MIL/uL — ABNORMAL HIGH (ref 3.87–5.11)
RDW: 13.8 % (ref 11.5–15.5)
WBC: 5.7 10*3/uL (ref 4.0–10.5)
nRBC: 0 % (ref 0.0–0.2)

## 2023-03-15 LAB — URINALYSIS, ROUTINE W REFLEX MICROSCOPIC
Bacteria, UA: NONE SEEN
Bilirubin Urine: NEGATIVE
Glucose, UA: >1000 mg/dL — AB
Hgb urine dipstick: NEGATIVE
Ketones, ur: NEGATIVE mg/dL
Leukocytes,Ua: NEGATIVE
Nitrite: NEGATIVE
Protein, ur: NEGATIVE mg/dL
Specific Gravity, Urine: 1.037 — ABNORMAL HIGH (ref 1.005–1.030)
pH: 5 (ref 5.0–8.0)

## 2023-03-15 LAB — CBG MONITORING, ED
Glucose-Capillary: >600 mg/dL (ref 70–99)
Glucose-Capillary: 495 mg/dL — ABNORMAL HIGH (ref 70–99)
Glucose-Capillary: 294 mg/dL — ABNORMAL HIGH (ref 70–99)

## 2023-03-15 MED ORDER — ACETAMINOPHEN 500 MG PO TABS
1000.0000 mg | ORAL_TABLET | Freq: Once | ORAL | Status: AC
  Administered 2023-03-15: 1000 mg via ORAL
  Filled 2023-03-15: qty 2

## 2023-03-15 MED ORDER — SODIUM CHLORIDE 0.9 % IV BOLUS (SEPSIS)
2000.0000 mL | Freq: Once | INTRAVENOUS | Status: AC
  Administered 2023-03-15: 1000 mL via INTRAVENOUS

## 2023-03-15 MED ORDER — SODIUM CHLORIDE 0.9 % IV BOLUS (SEPSIS): 1000.0000 mL | Freq: Once | INTRAVENOUS | Status: DC

## 2023-03-15 MED ORDER — INSULIN ASPART 100 UNIT/ML IV SOLN
15.0000 [IU] | Freq: Once | INTRAVENOUS | Status: AC
  Administered 2023-03-15: 15 [IU] via INTRAVENOUS

## 2023-03-15 NOTE — Discharge Instructions (Addendum)
You have been seen in the Emergency Department (ED) today following a fall.  Your workup today did not reveal any injuries that require you to stay in the hospital. You can expect to be stiff and sore for the next several days.  Please take Tylenol or Motrin as needed for pain, but only as written on the box.  Your sugars were high but improved with fluids and insulin here.  Make sure to have a close follow-up with your primary care doctor regarding your management of your blood sugars and diabetes.  Please follow up with your primary care doctor as soon as possible regarding today's ED visit and your recent accident.   Call your doctor or return to the ED if you develop a sudden or severe headache, confusion, slurred speech, facial droop, weakness or numbness in any arm or leg,  extreme fatigue, vomiting more than two times, severe abdominal pain, difficulty breathing or any other concerning signs or symptoms.

## 2023-03-15 NOTE — ED Notes (Signed)
Dc instructions reviewed with patient. Patient voiced understanding. Dc with belongings.  °

## 2023-03-15 NOTE — ED Provider Notes (Signed)
[place] EMERGENCY DEPARTMENT AT [facility] Provider Note   CSN: [id number] Arrival date & time: 03/15/23  1443     History  Chief Complaint  Patient presents with   Grace Gallagher is a 52 y.o. female.  With PMH of HTN, IDDM, anemia who presents with complaints of fall earlier today and left knee pain as well as high blood sugars.  She says her high sugars have been ongoing for some time.  She says she is compliant with her diabetes medicines.  She has not been ill recently.  No fevers, no chills, no vomiting, no diarrhea, no chest pain or shortness of breath.  She was going to jury duty today when she tripped up the stairs and landed on her left knee.  She has been ambulatory since.  No focal weakness numbness or tingling.  She is complaining of generalized weakness.  She has been drinking more than usual and peeing more than usual.  She had endorsed some mild burning with urination and itching.   Fall       Home Medications Prior to Admission medications   Medication Sig Start Date End Date Taking? Authorizing Provider  Ascorbic Acid (VITAMIN C) 1000 MG tablet Take 1,000 mg by mouth daily.    [provider]  carvedilol (COREG) 25 MG tablet Take 1 tablet (25 mg total) by mouth 2 (two) times daily. 01/03/20 04/02/20  Alver Sorrow, NP  Continuous Blood Gluc Sensor (DEXCOM G6 SENSOR) MISC SMARTSIG:1 Topical Every 10 Days 11/01/21   [provider]  Continuous Blood Gluc Transmit (DEXCOM G6 TRANSMITTER) MISC See admin instructions. 10/05/21   [provider]  diltiazem (CARDIZEM CD) 180 MG 24 hr capsule Take 180 mg by mouth daily. 11/26/21   [provider]  HUMALOG KWIKPEN 200 UNIT/ML KwikPen SMARTSIG:Unit(s) SUB-Q PRN 11/28/21   [provider]  ibuprofen (ADVIL) 800 MG tablet Take 800 mg by mouth every 6 (six) hours as needed. 11/22/21   [provider]  insulin NPH-regular Human (70-30) 100 UNIT/ML  injection Inject 20 Units into the skin 2 (two) times daily with a meal.    [provider]  meloxicam (MOBIC) 15 MG tablet Take 1 tablet (15 mg total) by mouth daily. 02/09/22   Hyatt, Max T, DPM  metFORMIN (GLUCOPHAGE) 1000 MG tablet Take 1,000 mg by mouth 2 (two) times daily with a meal.    [provider]  methylPREDNISolone (MEDROL DOSEPAK) 4 MG TBPK tablet 6 day dose pack - take as directed 02/09/22   Hyatt, Max T, DPM  Olmesartan-amLODIPine-HCTZ 40-10-25 MG TABS Take 1 tablet by mouth daily. 04/04/22   [provider]  ondansetron (ZOFRAN-ODT) 4 MG disintegrating tablet Take 1 tablet (4 mg total) by mouth every 8 (eight) hours as needed for nausea or vomiting. 04/01/22   Alvira Monday, MD  topiramate (TOPAMAX) 25 MG tablet Take by mouth. 10/30/21   [provider]  TOUJEO SOLOSTAR 300 UNIT/ML Solostar Pen Inject into the skin. 09/30/21   [provider]  VITAMIN D PO Take by mouth daily.    [provider]      Allergies    Hydrocodone and Influenza virus vaccine    Review of Systems   Review of Systems  Physical Exam Updated Vital Signs BP (!) 152/92   Pulse 64   Temp 97.8 F (36.6 C)   Resp 17   Ht 5\' 6"  (1.676 m)   Wt 105.2 kg  LMP 04/03/2018   SpO2 97%   BMI 37.45 kg/m  Physical Exam Constitutional: Alert and oriented.  Nontoxic no acute distress Eyes: Conjunctivae are normal. ENT      Head: Normocephalic and atraumatic.      Nose: No congestion.      Mouth/Throat: Mucous membranes are dry.      Neck: No stridor. Cardiovascular: S1, S2,  Normal and symmetric distal pulses are present in all extremities.Warm and well perfused. Respiratory: Normal respiratory effort. Breath sounds are normal.  O2 sat 97 on Gastrointestinal: Soft and nontender.  Musculoskeletal: Normal range of motion in all extremities.      Right lower leg: No tenderness or edema.      Left lower leg: No tenderness or edema. Full ROM Left hip,  knee and foot intact. Sensation intact. No effusion or traumatic injury of left knee. Palpable distal pulses. Neurologic: Normal speech and language.  No facial droop.  Moving all extremities equally.  Sensation grossly intact.  No gross focal neurologic deficits are appreciated. Skin: Skin is warm, dry and intact. No rash noted. Psychiatric: Mood and affect are normal. Speech and behavior are normal.  ED Results / Procedures / Treatments   Labs (all labs ordered are listed, but only abnormal results are displayed) Labs Reviewed  BASIC METABOLIC PANEL - Abnormal; Notable for the following components:      Result Value   Sodium 131 (*)    Chloride 95 (*)    Glucose, Bld 693 (*)    All other components within normal limits  CBC - Abnormal; Notable for the following components:   RBC 5.75 (*)    MCV 71.8 (*)    MCH 22.3 (*)    All other components within normal limits  URINALYSIS, ROUTINE W REFLEX MICROSCOPIC - Abnormal; Notable for the following components:   Specific Gravity, Urine 1.037 (*)    Glucose, UA >1,000 (*)    All other components within normal limits  CBG MONITORING, ED - Abnormal; Notable for the following components:   Glucose-Capillary >600 (*)    All other components within normal limits  CBG MONITORING, ED - Abnormal; Notable for the following components:   Glucose-Capillary 495 (*)    All other components within normal limits  CBG MONITORING, ED - Abnormal; Notable for the following components:   Glucose-Capillary 294 (*)    All other components within normal limits  WET PREP, GENITAL    EKG EKG Interpretation  Date/Time:  Wednesday March 15 2023 15:12:01 EDT Ventricular Rate:  68 PR Interval:  170 QRS Duration: 97 QT Interval:  406 QTC Calculation: 432 R Axis:   53 Text Interpretation: Sinus rhythm LAE, consider biatrial enlargement  T wave inversions lateral leads improved from prior Confirmed by Vivien RossettiBranham, Lathen Seal ([id number]) on 03/15/2023 3:15:23  PM  Radiology DG Knee 2 Views Left  Result Date: 03/15/2023 CLINICAL DATA:  Fall, pain EXAM: LEFT KNEE - 1-2 VIEW COMPARISON:  None Available. FINDINGS: No evidence of fracture, dislocation, or joint effusion. No evidence of arthropathy or other focal bone abnormality. Soft tissues are unremarkable. IMPRESSION: No acute abnormality Electronically Signed   By: Judie PetitM.  Shick M.D.   On: 03/15/2023 15:47    Procedures Procedures    Medications Ordered in ED Medications  acetaminophen (TYLENOL) tablet 1,000 mg (1,000 mg Oral Given 03/15/23 1545)  sodium chloride 0.9 % bolus 2,000 mL (1,000 mLs Intravenous New Bag/Given 03/15/23 1544)  insulin aspart (novoLOG) injection 15 Units (15 Units Intravenous Given 03/15/23  76)    ED Course/ Medical Decision Making/ A&P                             Medical Decision Making Grace Gallagher is a 52 y.o. female.  With PMH of HTN, IDDM, anemia who presents with complaints of fall earlier today and left knee pain as well as high blood sugars.    Patient had mechanical fall today with no preceding symptoms and no syncope.  She is neurologically intact and hemodynamically stable.  She had a left knee injury but left lower extremity neurovascularly intact.  Left knee x-ray obtained which I personally viewed no fracture or dislocation.  She was hyperglycemic 693 with normal bicarbonate 27 no anion gap not consistent with DKA.  Creatinine 0.97 within normal limits.  Mild hypochloremia 95 and hyponatremia 131 and elevated SG of urine suggestive of mild dehydration.  Given 2 L IV fluids and IV insulin with improvement to repeat blood sugar 294.  Patient discharged with advised for close follow-up with PCP for blood sugar monitoring and checks and management.  Strict return precaution discussed.  Discharged in stable good condition.  Amount and/or Complexity of Data Reviewed Labs: ordered. Radiology: ordered.  Risk OTC drugs.     Final Clinical Impression(s) /  ED Diagnoses Final diagnoses:  Dehydration  Hyperglycemia    Rx / DC Orders ED Discharge Orders     None         Mardene Sayer, MD 03/15/23 [phone]

## 2023-03-15 NOTE — ED Triage Notes (Signed)
Patient here POV from Home.  Endorses Fall at 0800 when she went to [employer]. Believes it was related to her BG but is unsure. Injured her Left Knee in the process. No LOC or Head Injury.   BG has been high recently.   NAD Noted during Triage. A&OX4. Gcs 15. Ambulatory.

## 2023-04-10 ENCOUNTER — Ambulatory Visit: Payer: Commercial Managed Care - PPO | Admitting: Internal Medicine

## 2023-05-23 ENCOUNTER — Emergency Department (HOSPITAL_BASED_OUTPATIENT_CLINIC_OR_DEPARTMENT_OTHER)
Admission: EM | Admit: 2023-05-23 | Discharge: 2023-05-23 | Disposition: A | Discharge status: Home / Self Care | Payer: 59 | Attending: Emergency Medicine | Admitting: Emergency Medicine

## 2023-05-23 ENCOUNTER — Other Ambulatory Visit: Payer: Self-pay

## 2023-05-23 ENCOUNTER — Encounter (HOSPITAL_BASED_OUTPATIENT_CLINIC_OR_DEPARTMENT_OTHER): Payer: Self-pay | Admitting: Emergency Medicine

## 2023-05-23 DIAGNOSIS — R339 Retention of urine, unspecified: Secondary | ICD-10-CM | POA: Insufficient documentation

## 2023-05-23 DIAGNOSIS — I10 Essential (primary) hypertension: Secondary | ICD-10-CM | POA: Diagnosis not present

## 2023-05-23 DIAGNOSIS — Z7984 Long term (current) use of oral hypoglycemic drugs: Secondary | ICD-10-CM | POA: Insufficient documentation

## 2023-05-23 DIAGNOSIS — Z794 Long term (current) use of insulin: Secondary | ICD-10-CM | POA: Diagnosis not present

## 2023-05-23 DIAGNOSIS — E860 Dehydration: Secondary | ICD-10-CM

## 2023-05-23 DIAGNOSIS — M545 Low back pain, unspecified: Secondary | ICD-10-CM | POA: Diagnosis not present

## 2023-05-23 DIAGNOSIS — R739 Hyperglycemia, unspecified: Secondary | ICD-10-CM

## 2023-05-23 DIAGNOSIS — Z79899 Other long term (current) drug therapy: Secondary | ICD-10-CM | POA: Diagnosis not present

## 2023-05-23 DIAGNOSIS — E1165 Type 2 diabetes mellitus with hyperglycemia: Secondary | ICD-10-CM | POA: Diagnosis present

## 2023-05-23 LAB — COMPREHENSIVE METABOLIC PANEL
ALT: 12 U/L (ref 0–44)
AST: 10 U/L — ABNORMAL LOW (ref 15–41)
Albumin: 3.9 g/dL (ref 3.5–5.0)
Alkaline Phosphatase: 65 U/L (ref 38–126)
Anion gap: 8 (ref 5–15)
BUN: 18 mg/dL (ref 6–20)
CO2: 27 mmol/L (ref 22–32)
Calcium: 9.5 mg/dL (ref 8.9–10.3)
Chloride: 103 mmol/L (ref 98–111)
Creatinine, Ser: 1.04 mg/dL — ABNORMAL HIGH (ref 0.44–1.00)
GFR, Estimated: >60 mL/min (ref >60)
Glucose, Bld: 301 mg/dL — ABNORMAL HIGH (ref 70–99)
Potassium: 3.6 mmol/L (ref 3.5–5.1)
Sodium: 138 mmol/L (ref 135–145)
Total Bilirubin: 1 mg/dL (ref 0.3–1.2)
Total Protein: 6.5 g/dL (ref 6.5–8.1)

## 2023-05-23 LAB — URINALYSIS, W/ REFLEX TO CULTURE (INFECTION SUSPECTED)
Bacteria, UA: NONE SEEN
Bilirubin Urine: NEGATIVE
Glucose, UA: >1000 mg/dL — AB
Hgb urine dipstick: NEGATIVE
Ketones, ur: 15 mg/dL — AB
Nitrite: NEGATIVE
Specific Gravity, Urine: 1.046 — ABNORMAL HIGH (ref 1.005–1.030)
pH: 5 (ref 5.0–8.0)

## 2023-05-23 LAB — CBC WITH DIFFERENTIAL/PLATELET
Abs Immature Granulocytes: 0.01 10*3/uL (ref 0.00–0.07)
Basophils Absolute: 0 10*3/uL (ref 0.0–0.1)
Basophils Relative: 0 %
Eosinophils Absolute: 0.1 10*3/uL (ref 0.0–0.5)
Eosinophils Relative: 2 %
HCT: 39.3 % (ref 36.0–46.0)
Hemoglobin: 12.3 g/dL (ref 12.0–15.0)
Immature Granulocytes: 0 %
Lymphocytes Relative: 50 %
Lymphs Abs: 2.2 10*3/uL (ref 0.7–4.0)
MCH: 22.3 pg — ABNORMAL LOW (ref 26.0–34.0)
MCHC: 31.3 g/dL (ref 30.0–36.0)
MCV: 71.3 fL — ABNORMAL LOW (ref 80.0–100.0)
Monocytes Absolute: 0.4 10*3/uL (ref 0.1–1.0)
Monocytes Relative: 8 %
Neutro Abs: 1.8 10*3/uL (ref 1.7–7.7)
Neutrophils Relative %: 40 %
Platelets: 142 10*3/uL — ABNORMAL LOW (ref 150–400)
RBC: 5.51 MIL/uL — ABNORMAL HIGH (ref 3.87–5.11)
RDW: 13.9 % (ref 11.5–15.5)
WBC: 4.5 10*3/uL (ref 4.0–10.5)
nRBC: 0 % (ref 0.0–0.2)

## 2023-05-23 LAB — CBG MONITORING, ED
Glucose-Capillary: 254 mg/dL — ABNORMAL HIGH (ref 70–99)
Glucose-Capillary: 262 mg/dL — ABNORMAL HIGH (ref 70–99)

## 2023-05-23 MED ORDER — LACTATED RINGERS IV BOLUS
1000.0000 mL | Freq: Once | INTRAVENOUS | Status: AC
  Administered 2023-05-23: 1000 mL via INTRAVENOUS

## 2023-05-23 NOTE — ED Provider Notes (Signed)
[place] EMERGENCY DEPARTMENT AT [facility] Provider Note   CSN: [id number] Arrival date & time: 05/23/23  1827     History  Chief Complaint  Patient presents with   Hyperglycemia    Grace Gallagher is a 52 y.o. female.  With history of hypertension, diabetes, anemia who presents to the ED for evaluation of low back pain, urinary retention and hyperglycemia.  She states her blood sugars have been between 300 and 500 for the past 2 weeks.  She reports taking her long-acting and sliding scale insulin as well as metformin as prescribed.  She denies any recent illnesses.  She does not know why her blood sugars have been elevated lately but states that it happens from time to time.  She denies history of DKA or ever needing hospitalization for hyperglycemia.  She reports some increased thirst but decreased hunger which she believes is secondary to a new medication.  She denies any abdominal pain, nausea, vomiting, chest pain, shortness of breath, fevers, chills.  She uses Toujeo, sliding scale Humalog, metformin for her diabetes. Last A1c was 15.  Reports blood sugar goal of 190 but states she rarely hits this goal.   Hyperglycemia      Home Medications Prior to Admission medications   Medication Sig Start Date End Date Taking? Authorizing Provider  Ascorbic Acid (VITAMIN C) 1000 MG tablet Take 1,000 mg by mouth daily.    [provider]  carvedilol (COREG) 25 MG tablet Take 1 tablet (25 mg total) by mouth 2 (two) times daily. 01/03/20 04/02/20  Alver Sorrow, NP  Continuous Blood Gluc Sensor (DEXCOM G6 SENSOR) MISC SMARTSIG:1 Topical Every 10 Days 11/01/21   [provider]  Continuous Blood Gluc Transmit (DEXCOM G6 TRANSMITTER) MISC See admin instructions. 10/05/21   [provider]  diltiazem (CARDIZEM CD) 180 MG 24 hr capsule Take 180 mg by mouth daily. 11/26/21   [provider]  HUMALOG KWIKPEN 200 UNIT/ML KwikPen SMARTSIG:Unit(s) SUB-Q  PRN 11/28/21   [provider]  ibuprofen (ADVIL) 800 MG tablet Take 800 mg by mouth every 6 (six) hours as needed. 11/22/21   [provider]  insulin NPH-regular Human (70-30) 100 UNIT/ML injection Inject 20 Units into the skin 2 (two) times daily with a meal.    [provider]  meloxicam (MOBIC) 15 MG tablet Take 1 tablet (15 mg total) by mouth daily. 02/09/22   Hyatt, Max T, DPM  metFORMIN (GLUCOPHAGE) 1000 MG tablet Take 1,000 mg by mouth 2 (two) times daily with a meal.    [provider]  methylPREDNISolone (MEDROL DOSEPAK) 4 MG TBPK tablet 6 day dose pack - take as directed 02/09/22   Hyatt, Max T, DPM  Olmesartan-amLODIPine-HCTZ 40-10-25 MG TABS Take 1 tablet by mouth daily. 04/04/22   [provider]  ondansetron (ZOFRAN-ODT) 4 MG disintegrating tablet Take 1 tablet (4 mg total) by mouth every 8 (eight) hours as needed for nausea or vomiting. 04/01/22   Alvira Monday, MD  topiramate (TOPAMAX) 25 MG tablet Take by mouth. 10/30/21   [provider]  TOUJEO SOLOSTAR 300 UNIT/ML Solostar Pen Inject into the skin. 09/30/21   [provider]  VITAMIN D PO Take by mouth daily.    [provider]      Allergies    Hydrocodone and Influenza virus vaccine    Review of Systems   Review of Systems  Musculoskeletal:  Positive for back pain.  All other systems reviewed and are  negative.   Physical Exam Updated Vital Signs BP (!) 165/87   Pulse 67   Temp 98.6 F (37 C) (Oral)   Resp 17   Ht 5\' 6"  (1.676 m)   Wt 98.4 kg   LMP 04/03/2018   SpO2 98%   BMI 35.02 kg/m  Physical Exam Vitals and nursing note reviewed.  Constitutional:      General: She is not in acute distress.    Appearance: She is well-developed.     Comments: Resting comfortably in bed  HENT:     Head: Normocephalic and atraumatic.     Mouth/Throat:     Comments: slightly dry mucous membranes Eyes:     Conjunctiva/sclera: Conjunctivae normal.   Cardiovascular:     Rate and Rhythm: Normal rate and regular rhythm.     Heart sounds: No murmur heard. Pulmonary:     Effort: Pulmonary effort is normal. No respiratory distress.     Breath sounds: Normal breath sounds. No wheezing, rhonchi or rales.  Abdominal:     Palpations: Abdomen is soft.     Tenderness: There is no abdominal tenderness. There is no guarding.  Musculoskeletal:        General: No swelling.     Cervical back: Neck supple.     Right lower leg: No edema.     Left lower leg: No edema.  Skin:    General: Skin is warm and dry.     Capillary Refill: Capillary refill takes less than 2 seconds.  Neurological:     General: No focal deficit present.     Mental Status: She is alert and oriented to person, place, and time.  Psychiatric:        Mood and Affect: Mood normal.        Behavior: Behavior normal.     ED Results / Procedures / Treatments   Labs (all labs ordered are listed, but only abnormal results are displayed) Labs Reviewed  COMPREHENSIVE METABOLIC PANEL - Abnormal; Notable for the following components:      Result Value   Glucose, Bld 301 (*)    Creatinine, Ser 1.04 (*)    AST 10 (*)    All other components within normal limits  CBC WITH DIFFERENTIAL/PLATELET - Abnormal; Notable for the following components:   RBC 5.51 (*)    MCV 71.3 (*)    MCH 22.3 (*)    Platelets 142 (*)    All other components within normal limits  URINALYSIS, W/ REFLEX TO CULTURE (INFECTION SUSPECTED) - Abnormal; Notable for the following components:   Specific Gravity, Urine 1.046 (*)    Glucose, UA >1,000 (*)    Ketones, ur 15 (*)    Protein, ur TRACE (*)    Leukocytes,Ua TRACE (*)    All other components within normal limits  CBG MONITORING, ED - Abnormal; Notable for the following components:   Glucose-Capillary 254 (*)    All other components within normal limits  CBG MONITORING, ED - Abnormal; Notable for the following components:   Glucose-Capillary 262 (*)     All other components within normal limits    EKG None  Radiology No results found.  Procedures Procedures    Medications Ordered in ED Medications  lactated ringers bolus 1,000 mL (1,000 mLs Intravenous New Bag/Given 05/23/23 1933)    ED Course/ Medical Decision Making/ A&P  Medical Decision Making Amount and/or Complexity of Data Reviewed Labs: ordered.  This patient presents to the ED for concern of back pain, hyperglycemia, this involves an extensive number of treatment options, and is a complaint that carries with it a high risk of complications and morbidity.  The differential diagnosis includes hyperglycemia, DKA, UTI  Co morbidities that complicate the patient evaluation  hypertension, diabetes, anemia  My initial workup includes labs, fluids  Additional history obtained from: Nursing notes from this visit. Previous records within EMR system ED visits for similar to 01/31/2023 and 03/15/2023  I ordered, reviewed and interpreted labs which include: CMP, CBC, urinalysis, initial CBG.  Initial CBG of 254.  CBC unremarkable.  Urine without evidence of infection and concentrated with specific gravity of 1.046.  Anion gap of 8.  Afebrile, hypertensive but otherwise hemodynamically stable.  52 year old female presents to the ED for evaluation of hyperglycemia, low back pain, urinary retention.  She was able to urinate without difficulty in the ED.  Electrolytes are within normal limits.  Creatinine is normal.  Anion gap is normal.  She denies any symptoms of DKA outside of increased thirst.  Do not believe DKA is likely.  Her urine is concentrated and patient is likely just dehydrated.  She was treated with IV fluids in the ED.  She was reporting some mild bilateral low back pain.  Her urine is without evidence of infection.  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  She believes she may have aggravated it at work.  At  this time there does not appear to be any evidence of an acute emergency medical condition and the patient appears stable for discharge with appropriate outpatient follow up. Diagnosis was discussed with patient who verbalizes understanding of care plan and is agreeable to discharge. I have discussed return precautions with patient who verbalizes understanding. Patient encouraged to follow-up with their PCP within 1 week. All questions answered.  Note: Portions of this report may have been transcribed using voice recognition software. Every effort was made to ensure accuracy; however, inadvertent computerized transcription errors may still be present.        Final Clinical Impression(s) / ED Diagnoses Final diagnoses:  Hyperglycemia  Acute bilateral low back pain without sciatica  Dehydration    Rx / DC Orders ED Discharge Orders     None         Mora Bellman 05/23/23 2101    Terrilee Files, MD 05/23/23 2153

## 2023-05-23 NOTE — ED Triage Notes (Signed)
Pt arrives pov, slow gait with cane, c/o lower back pain and concern for hyperglycemia. Reports excessive thirst, decreased urine output. Tramadol at 10am

## 2023-05-23 NOTE — Discharge Instructions (Addendum)
You have been seen today for your complaint of back pain, high blood sugar. Your lab work revealed a high blood sugar but was otherwise reassuring. Your discharge medications include your home medicines.  Take these as prescribed. Home care instructions are as follows:  Drink plenty of water Follow up with: Your primary care provider within the next 5 days for reevaluation of your blood sugar readings Please seek immediate medical care if you develop any of the following symptoms: Your blood glucose monitor reads "high" even when you are taking insulin. You have trouble breathing. You have a change in how you think, feel, or act (mental status). You have nausea or vomiting that does not go away. At this time there does not appear to be the presence of an emergent medical condition, however there is always the potential for conditions to change. Please read and follow the below instructions.  Do not take your medicine if  develop an itchy rash, swelling in your mouth or lips, or difficulty breathing; call 911 and seek immediate emergency medical attention if this occurs.  You may review your lab tests and imaging results in their entirety on your MyChart account.  Please discuss all results of fully with your primary care provider and other specialist at your follow-up visit.  Note: Portions of this text may have been transcribed using voice recognition software. Every effort was made to ensure accuracy; however, inadvertent computerized transcription errors may still be present.

## 2023-06-12 ENCOUNTER — Encounter (HOSPITAL_BASED_OUTPATIENT_CLINIC_OR_DEPARTMENT_OTHER): Payer: Self-pay

## 2023-06-12 ENCOUNTER — Other Ambulatory Visit: Payer: Self-pay

## 2023-06-12 ENCOUNTER — Emergency Department (HOSPITAL_BASED_OUTPATIENT_CLINIC_OR_DEPARTMENT_OTHER): Payer: BC Managed Care – PPO | Admitting: Radiology

## 2023-06-12 ENCOUNTER — Emergency Department (HOSPITAL_BASED_OUTPATIENT_CLINIC_OR_DEPARTMENT_OTHER)
Admission: EM | Admit: 2023-06-12 | Discharge: 2023-06-12 | Disposition: A | Discharge status: Home / Self Care | Payer: BC Managed Care – PPO | Attending: Emergency Medicine | Admitting: Emergency Medicine

## 2023-06-12 DIAGNOSIS — Y92838 Other recreation area as the place of occurrence of the external cause: Secondary | ICD-10-CM | POA: Diagnosis not present

## 2023-06-12 DIAGNOSIS — W01198A Fall on same level from slipping, tripping and stumbling with subsequent striking against other object, initial encounter: Secondary | ICD-10-CM | POA: Insufficient documentation

## 2023-06-12 DIAGNOSIS — Z79899 Other long term (current) drug therapy: Secondary | ICD-10-CM | POA: Diagnosis not present

## 2023-06-12 DIAGNOSIS — Z794 Long term (current) use of insulin: Secondary | ICD-10-CM | POA: Insufficient documentation

## 2023-06-12 DIAGNOSIS — I10 Essential (primary) hypertension: Secondary | ICD-10-CM | POA: Insufficient documentation

## 2023-06-12 DIAGNOSIS — E119 Type 2 diabetes mellitus without complications: Secondary | ICD-10-CM | POA: Diagnosis not present

## 2023-06-12 DIAGNOSIS — M25571 Pain in right ankle and joints of right foot: Secondary | ICD-10-CM | POA: Diagnosis not present

## 2023-06-12 DIAGNOSIS — Z7984 Long term (current) use of oral hypoglycemic drugs: Secondary | ICD-10-CM | POA: Diagnosis not present

## 2023-06-12 DIAGNOSIS — W19XXXA Unspecified fall, initial encounter: Secondary | ICD-10-CM

## 2023-06-12 DIAGNOSIS — M25561 Pain in right knee: Secondary | ICD-10-CM | POA: Diagnosis not present

## 2023-06-12 DIAGNOSIS — M25511 Pain in right shoulder: Secondary | ICD-10-CM | POA: Diagnosis present

## 2023-06-12 MED ORDER — IBUPROFEN 400 MG PO TABS
600.0000 mg | ORAL_TABLET | Freq: Once | ORAL | Status: DC
  Filled 2023-06-12: qty 1

## 2023-06-12 NOTE — Discharge Instructions (Signed)
As discussed, x-rays are negative for any acute fracture/dislocation.  You do have concern for ligament injury of your right ankle.  Wear ankle brace as needed for extra support.  Also recommend while supporting athletic shoes.  Recommend rest, ice, elevation of affected extremity above the level of your heart as well as medication in the form of ibuprofen/Aleve.  Recommend follow-up with orthopedics in the outpatient setting for reevaluation of your symptoms.  Please do not hesitate to return to emergency department for worrisome signs and symptoms we discussed become apparent.

## 2023-06-12 NOTE — ED Triage Notes (Signed)
Patient BIB [facility] from [facility].  Fall today at [facility]. Hit Impact possibly to Frontal Head. No LOC or Anticoagulants. Pain to Right Ankle, Right Shoulder, and Right Knee.   Mildly Hypertensive with EMS. No EMS Interventions.   NAD Noted During Triage. A&Ox4. Gcs 15. Ambulatory.

## 2023-06-12 NOTE — ED Notes (Signed)
Pt given discharge instructions. Opportunities given for questions. Pt verbalizes understanding. Shonte Beutler R, RN 

## 2023-06-12 NOTE — ED Provider Notes (Signed)
[place] EMERGENCY DEPARTMENT AT [facility] Provider Note   CSN: [id number] Arrival date & time: 06/12/23  1556     History  Chief Complaint  Patient presents with   Grace Gallagher is a 52 y.o. female.   Fall   52 year old female presents emergency department after a fall.  Patient states [place]pped on the concrete.  Reports pain in right ankle, right knee, right shoulder.  Reports possibly scratching the front of her forehead but does not remember any direct trauma to the concrete from her head.  Denies any blood thinner use, loss of consciousness.  Denies any visual disturbance, gait abnormality, weakness of sensory deficits lower extremities, slurred speech, facial droop.  Denies any chest pain, shortness of breath, abdominal pain, nausea, vomiting.  Past medical history significant for diabetes mellitus, anemia, hypertension, obesity  Home Medications Prior to Admission medications   Medication Sig Start Date End Date Taking? Authorizing Provider  Ascorbic Acid (VITAMIN C) 1000 MG tablet Take 1,000 mg by mouth daily.    [provider]  carvedilol (COREG) 25 MG tablet Take 1 tablet (25 mg total) by mouth 2 (two) times daily. 01/03/20 04/02/20  Alver Sorrow, NP  Continuous Blood Gluc Sensor (DEXCOM G6 SENSOR) MISC SMARTSIG:1 Topical Every 10 Days 11/01/21   [provider]  Continuous Blood Gluc Transmit (DEXCOM G6 TRANSMITTER) MISC See admin instructions. 10/05/21   [provider]  diltiazem (CARDIZEM CD) 180 MG 24 hr capsule Take 180 mg by mouth daily. 11/26/21   [provider]  HUMALOG KWIKPEN 200 UNIT/ML KwikPen SMARTSIG:Unit(s) SUB-Q PRN 11/28/21   [provider]  ibuprofen (ADVIL) 800 MG tablet Take 800 mg by mouth every 6 (six) hours as needed. 11/22/21   [provider]  insulin NPH-regular Human (70-30) 100 UNIT/ML injection Inject 20 Units into the skin 2  (two) times daily with a meal.    [provider]  meloxicam (MOBIC) 15 MG tablet Take 1 tablet (15 mg total) by mouth daily. 02/09/22   Hyatt, Max T, DPM  metFORMIN (GLUCOPHAGE) 1000 MG tablet Take 1,000 mg by mouth 2 (two) times daily with a meal.    [provider]  methylPREDNISolone (MEDROL DOSEPAK) 4 MG TBPK tablet 6 day dose pack - take as directed 02/09/22   Hyatt, Max T, DPM  Olmesartan-amLODIPine-HCTZ 40-10-25 MG TABS Take 1 tablet by mouth daily. 04/04/22   [provider]  ondansetron (ZOFRAN-ODT) 4 MG disintegrating tablet Take 1 tablet (4 mg total) by mouth every 8 (eight) hours as needed for nausea or vomiting. 04/01/22   Alvira Monday, MD  topiramate (TOPAMAX) 25 MG tablet Take by mouth. 10/30/21   [provider]  TOUJEO SOLOSTAR 300 UNIT/ML Solostar Pen Inject into the skin. 09/30/21   [provider]  VITAMIN D PO Take by mouth daily.    [provider]      Allergies    Hydrocodone and Influenza virus vaccine    Review of Systems   Review of Systems  All other systems reviewed and are negative.   Physical Exam Updated Vital Signs BP 127/82   Pulse 74   Temp (!) 97.5 F (36.4 C)   Resp 16   Ht 5\' 6"  (1.676 m)   Wt 100.7 kg   LMP 04/03/2018   SpO2 95%   BMI 35.83 kg/m  Physical Exam Vitals and nursing note reviewed.  Constitutional:  General: She is not in acute distress.    Appearance: She is well-developed.  HENT:     Head: Normocephalic and atraumatic.  Eyes:     Conjunctiva/sclera: Conjunctivae normal.  Cardiovascular:     Rate and Rhythm: Normal rate and regular rhythm.  Pulmonary:     Effort: Pulmonary effort is normal. No respiratory distress.     Breath sounds: Normal breath sounds. No wheezing, rhonchi or rales.  Abdominal:     Palpations: Abdomen is soft.     Tenderness: There is no abdominal tenderness.  Musculoskeletal:        General: No swelling.     Cervical back: Neck supple.      Comments: No midline tenderness of cervical, thoracic, lumbar spine with mildly step-off or Forei noted.  No chest wall tenderness to palpation.  Patient with tenderness palpation of right trapezial ridge without bony tenderness of proximal humerus, clavicle.  Patient with full range of motion bilateral shoulders, elbows, wrist, digits.  Radial pulses 2+ bilaterally.  Patient without tenderness palpation of bilateral hips but with tenderness palpation of right patella anteriorly.  Full range of motion of bilateral knees.  Full range of motion bilateral ankles, digits.  Patient with tenderness palpation of right posterior malleolus laterally as well as in the ATFL region on the right foot.  Pedal and posterior tibial pulses 2+ bilaterally.  Skin:    General: Skin is warm and dry.     Capillary Refill: Capillary refill takes less than 2 seconds.  Neurological:     Mental Status: She is alert.     Comments: Alert and oriented to self, place, time and event.   Speech is fluent, clear without dysarthria or dysphasia.   Strength 5/5 in upper/lower extremities   Sensation intact in upper/lower extremities   Normal gait.  CN I not tested  CN II not tested CN III, IV, VI PERRLA and EOMs intact bilaterally  CN V Intact sensation to sharp and light touch to the face  CN VII facial movements symmetric  CN VIII not tested  CN IX, X no uvula deviation, symmetric rise of soft palate  CN XI 5/5 SCM and trapezius strength bilaterally  CN XII Midline tongue protrusion, symmetric L/R movements     Psychiatric:        Mood and Affect: Mood normal.     ED Results / Procedures / Treatments   Labs (all labs ordered are listed, but only abnormal results are displayed) Labs Reviewed - No data to display  EKG None  Radiology DG Ankle Complete Right  Result Date: 06/12/2023 CLINICAL DATA:  Fall. EXAM: RIGHT ANKLE - COMPLETE 3+ VIEW COMPARISON:  None Available. FINDINGS: There is no evidence of  fracture, dislocation, or joint effusion. Plantar calcaneal spurring and Achilles enthesopathy. Soft tissue swelling about the lateral malleolus concerning for ligamentous injury. IMPRESSION: 1. No acute fracture or dislocation. 2. Soft tissue swelling about the lateral malleolus concerning for ligamentous injury. Electronically Signed   By: Larose Hires D.O.   On: 06/12/2023 17:10   DG Knee Complete 4 Views Right  Result Date: 06/12/2023 CLINICAL DATA:  Fall EXAM: RIGHT KNEE - COMPLETE 4+ VIEW COMPARISON:  None Available. FINDINGS: No evidence of fracture, dislocation, or joint effusion. Mild narrowing of the patellofemoral joint with small osteophytes suggesting osteoarthritis. Soft tissue calcification about the medial aspect of the distal femur, likely sequela of prior trauma/medial collateral ligament injury. IMPRESSION: 1. No acute fracture or dislocation. 2. Mild patellofemoral  osteoarthritis. Electronically Signed   By: Larose Hires D.O.   On: 06/12/2023 17:09   DG Shoulder Right  Result Date: 06/12/2023 CLINICAL DATA:  Fall. EXAM: RIGHT SHOULDER - 2+ VIEW COMPARISON:  None Available. FINDINGS: There is no evidence of fracture or dislocation. Arthritic changes of the acromioclavicular and glenohumeral joints. Soft tissues are unremarkable. IMPRESSION: No acute fracture or dislocation identified about the right shoulder. Electronically Signed   By: Larose Hires D.O.   On: 06/12/2023 17:07    Procedures Procedures    Medications Ordered in ED Medications  ibuprofen (ADVIL) tablet 600 mg (600 mg Oral Incomplete 06/12/23 1754)    ED Course/ Medical Decision Making/ A&P                             Medical Decision Making Amount and/or Complexity of Data Reviewed Radiology: ordered.   This patient presents to the ED for concern of fall, this involves an extensive number of treatment options, and is a complaint that carries with it a high risk of complications and morbidity.  The  differential diagnosis includes fracture, dislocation, strain/pain, ligamentous/tendinous injury, neurovascular compromise, CVA, pneumothorax, solid organ damage   Co morbidities that complicate the patient evaluation  See HPI   Additional history obtained:  Additional history obtained from EMR External records from outside source obtained and reviewed including hospital records   Lab Tests:  N/a   Imaging Studies ordered:  I ordered imaging studies including right shoulder x-ray, right ankle x-ray, right knee x-ray\  I independently visualized and interpreted imaging which showed  Right shoulder x-ray: No acute osseous abnormality Right ankle x-ray: No acute osseous abnormality.  Soft tissue swelling about lateral malleolus Right knee x-ray: No acute osseous abnormality.  Mild patellofemoral osteoarthritis I agree with the radiologist interpretation   Cardiac Monitoring: / EKG:  The patient was maintained on a cardiac monitor.  I personally viewed and interpreted the cardiac monitored which showed an underlying rhythm of: Sinus rhythm   Consultations Obtained:  N/a   Problem List / ED Course / Critical interventions / Medication management  Fall, right knee pain, right ankle pain, right shoulder pain Reevaluation of the patient showed that the patient stayed the same I have reviewed the patients home medicines and have made adjustments as needed   Social Determinants of Health:  Denies tobacco, illicit drug use   Test / Admission - Considered:  Fall, right knee pain, right ankle pain, right shoulder pain Vitals signs within normal range and stable throughout visit. imaging studies significant for: See above 52 year old female presents emergency department after mechanical fall tripping on a uneven spot in the sidewalk.  Patient initially presenting with right-sided knee pain, right ankle pain as well as right shoulder pain.  No evidence of acute osseous  abnormality appreciated on imaging studies obtained but patient with some lateral swelling of malleolus of right ankle concerning for ligamentous injury.  Patient placed in right ankle brace as well as recommended while supporting athletic shoes, rest, ice, elevation affected extremity and nonsteroidal anti-inflammatory medication for treatment of right ankle pain in addition to following up with orthopedics in the outpatient setting.  Patient was with very minor trauma to frontal aspect of head with small superficial abrasions.  No loss consciousness, anticoagulation use or neurologic deficit on exam.  Per [place] CT head score, CT imaging not indicated at this time.  Will recommend continued treatment of pain at home  with Tylenol/Motrin and recommend follow-up with primary care in the outpatient setting for further evaluation of patient's symptoms.  Treatment plan discussed at length with patient and she acknowledged understanding was agreeable to said plan.  Patient overall well-appearing, afebrile in no acute distress.  Further workup deemed unnecessary at this time while emergency department. Worrisome signs and symptoms were discussed with the patient, and the patient acknowledged understanding to return to the ED if noticed. Patient was stable upon discharge.          Final Clinical Impression(s) / ED Diagnoses Final diagnoses:  Fall, initial encounter    Rx / DC Orders ED Discharge Orders     None         Peter Garter, [place] 06/12/23 Claria Dice    Vanetta Mulders, MD 06/15/23 2249

## 2023-07-02 ENCOUNTER — Other Ambulatory Visit: Payer: Self-pay

## 2023-07-02 ENCOUNTER — Encounter (HOSPITAL_BASED_OUTPATIENT_CLINIC_OR_DEPARTMENT_OTHER): Payer: Self-pay | Admitting: Emergency Medicine

## 2023-07-02 DIAGNOSIS — I10 Essential (primary) hypertension: Secondary | ICD-10-CM | POA: Diagnosis not present

## 2023-07-02 DIAGNOSIS — Z794 Long term (current) use of insulin: Secondary | ICD-10-CM | POA: Insufficient documentation

## 2023-07-02 DIAGNOSIS — K5900 Constipation, unspecified: Secondary | ICD-10-CM | POA: Diagnosis present

## 2023-07-02 DIAGNOSIS — Z79899 Other long term (current) drug therapy: Secondary | ICD-10-CM | POA: Diagnosis not present

## 2023-07-02 DIAGNOSIS — K5641 Fecal impaction: Secondary | ICD-10-CM | POA: Insufficient documentation

## 2023-07-02 DIAGNOSIS — E119 Type 2 diabetes mellitus without complications: Secondary | ICD-10-CM | POA: Insufficient documentation

## 2023-07-02 DIAGNOSIS — Z7984 Long term (current) use of oral hypoglycemic drugs: Secondary | ICD-10-CM | POA: Insufficient documentation

## 2023-07-02 NOTE — ED Triage Notes (Signed)
Pt via pov from home with constipation that began about a month ago. She reports that she has been 4 days without a bowel movement and went to [facility] care today and they were unable to help her and sent her to the ED for treatment. Pt states she has been using OTC medications and dietary changes with no help. Pt alert & oriented, obviously uncomfortable in triage.

## 2023-07-03 ENCOUNTER — Emergency Department (HOSPITAL_BASED_OUTPATIENT_CLINIC_OR_DEPARTMENT_OTHER)
Admission: EM | Admit: 2023-07-03 | Discharge: 2023-07-03 | Disposition: A | Discharge status: Home / Self Care | Payer: BC Managed Care – PPO | Attending: Emergency Medicine | Admitting: Emergency Medicine

## 2023-07-03 DIAGNOSIS — K5900 Constipation, unspecified: Secondary | ICD-10-CM

## 2023-07-03 NOTE — ED Notes (Signed)
Reviewed AVS with patient, patient expressed understanding of directions, denies further questions at this time. 

## 2023-07-03 NOTE — ED Provider Notes (Signed)
[place] EMERGENCY DEPARTMENT AT [facility]  Provider Note  CSN: [id number] Arrival date & time: 07/02/23 2126  History Chief Complaint  Patient presents with   Constipation    Grace Gallagher is a 52 y.o. female with history of HTN, DM and constipation reports she has been constipated for the last 4 days with no stool output. She has tried miralax x 1, colace and prune juice without results. Went to UC today where she reports an xray and rectal exam was done but they told her it was 'too high up' and sent her to the ED for evaluation. She reports some abdominal cramping. No fever or vomiting.    Home Medications Prior to Admission medications   Medication Sig Start Date End Date Taking? Authorizing Provider  Ascorbic Acid (VITAMIN C) 1000 MG tablet Take 1,000 mg by mouth daily.    [provider]  carvedilol (COREG) 25 MG tablet Take 1 tablet (25 mg total) by mouth 2 (two) times daily. 01/03/20 04/02/20  Alver Sorrow, NP  Continuous Blood Gluc Sensor (DEXCOM G6 SENSOR) MISC SMARTSIG:1 Topical Every 10 Days 11/01/21   [provider]  Continuous Blood Gluc Transmit (DEXCOM G6 TRANSMITTER) MISC See admin instructions. 10/05/21   [provider]  diltiazem (CARDIZEM CD) 180 MG 24 hr capsule Take 180 mg by mouth daily. 11/26/21   [provider]  HUMALOG KWIKPEN 200 UNIT/ML KwikPen SMARTSIG:Unit(s) SUB-Q PRN 11/28/21   [provider]  ibuprofen (ADVIL) 800 MG tablet Take 800 mg by mouth every 6 (six) hours as needed. 11/22/21   [provider]  insulin NPH-regular Human (70-30) 100 UNIT/ML injection Inject 20 Units into the skin 2 (two) times daily with a meal.    [provider]  meloxicam (MOBIC) 15 MG tablet Take 1 tablet (15 mg total) by mouth daily. 02/09/22   Hyatt, Max T, DPM  metFORMIN (GLUCOPHAGE) 1000 MG tablet Take 1,000 mg by mouth 2 (two) times daily with a meal.    [provider]   methylPREDNISolone (MEDROL DOSEPAK) 4 MG TBPK tablet 6 day dose pack - take as directed 02/09/22   Hyatt, Max T, DPM  Olmesartan-amLODIPine-HCTZ 40-10-25 MG TABS Take 1 tablet by mouth daily. 04/04/22   [provider]  ondansetron (ZOFRAN-ODT) 4 MG disintegrating tablet Take 1 tablet (4 mg total) by mouth every 8 (eight) hours as needed for nausea or vomiting. 04/01/22   Alvira Monday, MD  topiramate (TOPAMAX) 25 MG tablet Take by mouth. 10/30/21   [provider]  TOUJEO SOLOSTAR 300 UNIT/ML Solostar Pen Inject into the skin. 09/30/21   [provider]  VITAMIN D PO Take by mouth daily.    [provider]     Allergies    Hydrocodone and Influenza virus vaccine   Review of Systems   Review of Systems Please see HPI for pertinent positives and negatives  Physical Exam BP (!) 185/107 (BP Location: Right Arm)   Pulse 71   Temp 98.9 F (37.2 C) (Oral)   Resp 16   Ht 5\' 6"  (1.676 m)   Wt 99.8 kg   LMP 04/03/2018   SpO2 100%   BMI 35.51 kg/m   Physical Exam Vitals and nursing note reviewed. Exam conducted with a chaperone present.  Constitutional:      Appearance: Normal appearance.  HENT:     Head: Normocephalic and atraumatic.     Nose: Nose normal.     Mouth/Throat:  Mouth: Mucous membranes are moist.  Eyes:     Extraocular Movements: Extraocular movements intact.     Conjunctiva/sclera: Conjunctivae normal.  Cardiovascular:     Rate and Rhythm: Normal rate.  Pulmonary:     Effort: Pulmonary effort is normal.     Breath sounds: Normal breath sounds.  Abdominal:     General: Abdomen is flat.     Palpations: Abdomen is soft.     Tenderness: There is no abdominal tenderness.  Genitourinary:    Comments: Stool ball in rectal vault is not particular hard, broken up digitally. Soft brown stool. No masses, no hemorrhoids.  Musculoskeletal:        General: No swelling. Normal range of motion.     Cervical back: Neck supple.   Skin:    General: Skin is warm and dry.  Neurological:     General: No focal deficit present.     Mental Status: She is alert.  Psychiatric:        Mood and Affect: Mood normal.     ED Results / Procedures / Treatments   EKG None  Procedures Procedures  Medications Ordered in the ED Medications - No data to display  Initial Impression and Plan  Patient here with constipation and fecal impaction. Attempted digital disimpaction, she is going to try to have a BM. If no results, may need enema.   ED Course   Clinical Course as of 07/03/23 Pearla Dubonnet Jul 03, 2023  0224 Patient able to have a small BM here. She would like to go home to attempt enema. Also advised to increase miralax to TID until having regular BM.  [CS]    Clinical Course User Index [CS] Pollyann Savoy, MD     MDM Rules/Calculators/A&P Medical Decision Making Problems Addressed: Constipation, unspecified constipation type: acute illness or injury  Risk OTC drugs.     Final Clinical Impression(s) / ED Diagnoses Final diagnoses:  Constipation, unspecified constipation type    Rx / DC Orders ED Discharge Orders     None        Pollyann Savoy, MD 07/03/23 0225

## 2023-07-03 NOTE — ED Notes (Signed)
Patient was able to have BM after digital disimpaction performed by MD Bernette Mayers.

## 2023-07-22 ENCOUNTER — Other Ambulatory Visit: Payer: Self-pay

## 2023-07-22 ENCOUNTER — Emergency Department (HOSPITAL_BASED_OUTPATIENT_CLINIC_OR_DEPARTMENT_OTHER): Payer: BC Managed Care – PPO

## 2023-07-22 ENCOUNTER — Encounter (HOSPITAL_BASED_OUTPATIENT_CLINIC_OR_DEPARTMENT_OTHER): Payer: Self-pay

## 2023-07-22 ENCOUNTER — Emergency Department (HOSPITAL_BASED_OUTPATIENT_CLINIC_OR_DEPARTMENT_OTHER)
Admission: EM | Admit: 2023-07-22 | Discharge: 2023-07-22 | Disposition: A | Discharge status: Home / Self Care | Payer: BC Managed Care – PPO | Attending: Emergency Medicine | Admitting: Emergency Medicine

## 2023-07-22 DIAGNOSIS — Z7951 Long term (current) use of inhaled steroids: Secondary | ICD-10-CM | POA: Diagnosis not present

## 2023-07-22 DIAGNOSIS — Z7952 Long term (current) use of systemic steroids: Secondary | ICD-10-CM | POA: Diagnosis not present

## 2023-07-22 DIAGNOSIS — I10 Essential (primary) hypertension: Secondary | ICD-10-CM | POA: Insufficient documentation

## 2023-07-22 DIAGNOSIS — J209 Acute bronchitis, unspecified: Secondary | ICD-10-CM | POA: Insufficient documentation

## 2023-07-22 DIAGNOSIS — R0602 Shortness of breath: Secondary | ICD-10-CM | POA: Diagnosis present

## 2023-07-22 DIAGNOSIS — Z20822 Contact with and (suspected) exposure to covid-19: Secondary | ICD-10-CM | POA: Insufficient documentation

## 2023-07-22 DIAGNOSIS — E119 Type 2 diabetes mellitus without complications: Secondary | ICD-10-CM | POA: Insufficient documentation

## 2023-07-22 LAB — RESP PANEL BY RT-PCR (RSV, FLU A&B, COVID)  RVPGX2
Influenza A by PCR: NEGATIVE
Influenza B by PCR: NEGATIVE
Resp Syncytial Virus by PCR: NEGATIVE
SARS Coronavirus 2 by RT PCR: NEGATIVE

## 2023-07-22 LAB — COMPREHENSIVE METABOLIC PANEL
ALT: 14 U/L (ref 0–44)
AST: 13 U/L — ABNORMAL LOW (ref 15–41)
Albumin: 4.1 g/dL (ref 3.5–5.0)
Alkaline Phosphatase: 61 U/L (ref 38–126)
Anion gap: 11 (ref 5–15)
BUN: 25 mg/dL — ABNORMAL HIGH (ref 6–20)
CO2: 23 mmol/L (ref 22–32)
Calcium: 9.6 mg/dL (ref 8.9–10.3)
Chloride: 106 mmol/L (ref 98–111)
Creatinine, Ser: 0.82 mg/dL (ref 0.44–1.00)
GFR, Estimated: >60 mL/min (ref >60)
Glucose, Bld: 158 mg/dL — ABNORMAL HIGH (ref 70–99)
Potassium: 3.6 mmol/L (ref 3.5–5.1)
Sodium: 140 mmol/L (ref 135–145)
Total Bilirubin: 0.4 mg/dL (ref 0.3–1.2)
Total Protein: 7.1 g/dL (ref 6.5–8.1)

## 2023-07-22 LAB — CBC
HCT: 35.7 % — ABNORMAL LOW (ref 36.0–46.0)
Hemoglobin: 11.4 g/dL — ABNORMAL LOW (ref 12.0–15.0)
MCH: 23.7 pg — ABNORMAL LOW (ref 26.0–34.0)
MCHC: 31.9 g/dL (ref 30.0–36.0)
MCV: 74.1 fL — ABNORMAL LOW (ref 80.0–100.0)
Platelets: 216 10*3/uL (ref 150–400)
RBC: 4.82 MIL/uL (ref 3.87–5.11)
RDW: 13.5 % (ref 11.5–15.5)
WBC: 5.5 10*3/uL (ref 4.0–10.5)
nRBC: 0 % (ref 0.0–0.2)

## 2023-07-22 LAB — TROPONIN I (HIGH SENSITIVITY): Troponin I (High Sensitivity): 10 ng/L (ref <18)

## 2023-07-22 LAB — BRAIN NATRIURETIC PEPTIDE: B Natriuretic Peptide: 18.1 pg/mL (ref 0.0–100.0)

## 2023-07-22 MED ORDER — NITROGLYCERIN 0.4 MG SL SUBL: 0.4000 mg | SUBLINGUAL_TABLET | SUBLINGUAL | Status: DC | PRN

## 2023-07-22 MED ORDER — AZITHROMYCIN 250 MG PO TABS
500.0000 mg | ORAL_TABLET | Freq: Once | ORAL | Status: AC
  Administered 2023-07-22: 500 mg via ORAL
  Filled 2023-07-22: qty 2

## 2023-07-22 MED ORDER — AZITHROMYCIN 250 MG PO TABS: 250.0000 mg | ORAL_TABLET | Freq: Every day | ORAL | 0 refills | Status: DC

## 2023-07-22 NOTE — ED Triage Notes (Addendum)
Pt arrived POV for SOB after calling EMS for SOB, refused transport as she did not want to go to [facility]. Pt also reports chest discomfort, fatigue, non-productive cough, feeling light headed this am. Also reports diarrhea last night. BP 204/110. Slight expiratory wheezing noted and diaphoretic. RT in triage to assess pt

## 2023-07-22 NOTE — Discharge Instructions (Signed)
Begin taking Zithromax as prescribed.  Take over-the-counter medications as needed for symptom relief.  Drink plenty of fluids and get plenty of rest.  Follow-up with your primary doctor if not improving in the next 3 to 4 days, and return to the ER if symptoms significantly worsen or change.

## 2023-07-22 NOTE — ED Provider Notes (Signed)
EMERGENCY DEPARTMENT AT [facility] Provider Note   CSN: [id number] Arrival date & time: 07/22/23  2127     History  Chief Complaint  Patient presents with   Multiple Complaints    Grace Gallagher is a 52 y.o. female.  Patient is a 52 year old female with past medical history of diabetes, hypertension, anemia, hyperlipidemia.  Patient presenting today with complaints of chest congestion, cough, shortness of breath, fatigue, and elevated blood pressure.  This started approximately 1 week ago, but has worsened over the past few days.  This morning she walked outside to take out the garbage, then became very fatigued and had to return into the house and lay down.  She does report cough that is nonproductive and also feels congested in her chest and sinuses.  No leg pain or swelling.  No fevers or chills.  She denies any ill contacts.  The history is provided by the patient.       Home Medications Prior to Admission medications   Medication Sig Start Date End Date Taking? Authorizing Provider  Ascorbic Acid (VITAMIN C) 1000 MG tablet Take 1,000 mg by mouth daily.    [provider]  carvedilol (COREG) 25 MG tablet Take 1 tablet (25 mg total) by mouth 2 (two) times daily. 01/03/20 04/02/20  Alver Sorrow, NP  Continuous Blood Gluc Sensor (DEXCOM G6 SENSOR) MISC SMARTSIG:1 Topical Every 10 Days 11/01/21   [provider]  Continuous Blood Gluc Transmit (DEXCOM G6 TRANSMITTER) MISC See admin instructions. 10/05/21   [provider]  diltiazem (CARDIZEM CD) 180 MG 24 hr capsule Take 180 mg by mouth daily. 11/26/21   [provider]  HUMALOG KWIKPEN 200 UNIT/ML KwikPen SMARTSIG:Unit(s) SUB-Q PRN 11/28/21   [provider]  ibuprofen (ADVIL) 800 MG tablet Take 800 mg by mouth every 6 (six) hours as needed. 11/22/21   [provider]  insulin NPH-regular Human (70-30) 100 UNIT/ML injection Inject 20 Units into the skin 2  (two) times daily with a meal.    [provider]  meloxicam (MOBIC) 15 MG tablet Take 1 tablet (15 mg total) by mouth daily. 02/09/22   Hyatt, Max T, DPM  metFORMIN (GLUCOPHAGE) 1000 MG tablet Take 1,000 mg by mouth 2 (two) times daily with a meal.    [provider]  methylPREDNISolone (MEDROL DOSEPAK) 4 MG TBPK tablet 6 day dose pack - take as directed 02/09/22   Hyatt, Max T, DPM  Olmesartan-amLODIPine-HCTZ 40-10-25 MG TABS Take 1 tablet by mouth daily. 04/04/22   [provider]  ondansetron (ZOFRAN-ODT) 4 MG disintegrating tablet Take 1 tablet (4 mg total) by mouth every 8 (eight) hours as needed for nausea or vomiting. 04/01/22   Alvira Monday, MD  topiramate (TOPAMAX) 25 MG tablet Take by mouth. 10/30/21   [provider]  TOUJEO SOLOSTAR 300 UNIT/ML Solostar Pen Inject into the skin. 09/30/21   [provider]  VITAMIN D PO Take by mouth daily.    [provider]      Allergies    Hydrocodone and Influenza virus vaccine    Review of Systems   Review of Systems  All other systems reviewed and are negative.   Physical Exam Updated Vital Signs BP (!) 161/98   Pulse 70   Temp 98.6 F (37 C)   Resp 17   Ht 5\' 6"  (1.676 m)   Wt 99.8 kg   LMP 04/03/2018   SpO2 96%   BMI 35.51  kg/m  Physical Exam Vitals and nursing note reviewed.  Constitutional:      General: She is not in acute distress.    Appearance: She is well-developed. She is not diaphoretic.  HENT:     Head: Normocephalic and atraumatic.  Cardiovascular:     Rate and Rhythm: Normal rate and regular rhythm.     Heart sounds: No murmur heard.    No friction rub. No gallop.  Pulmonary:     Effort: Pulmonary effort is normal. No respiratory distress.     Breath sounds: Normal breath sounds. No wheezing.  Abdominal:     General: Bowel sounds are normal. There is no distension.     Palpations: Abdomen is soft.     Tenderness: There is no abdominal tenderness.   Musculoskeletal:        General: No swelling or tenderness. Normal range of motion.     Cervical back: Normal range of motion and neck supple.     Right lower leg: No edema.     Left lower leg: No edema.  Skin:    General: Skin is warm and dry.  Neurological:     General: No focal deficit present.     Mental Status: She is alert and oriented to person, place, and time.     ED Results / Procedures / Treatments   Labs (all labs ordered are listed, but only abnormal results are displayed) Labs Reviewed  COMPREHENSIVE METABOLIC PANEL - Abnormal; Notable for the following components:      Result Value   Glucose, Bld 158 (*)    BUN 25 (*)    AST 13 (*)    All other components within normal limits  CBC - Abnormal; Notable for the following components:   Hemoglobin 11.4 (*)    HCT 35.7 (*)    MCV 74.1 (*)    MCH 23.7 (*)    All other components within normal limits  RESP PANEL BY RT-PCR (RSV, FLU A&B, COVID)  RVPGX2  BRAIN NATRIURETIC PEPTIDE  TROPONIN I (HIGH SENSITIVITY)  TROPONIN I (HIGH SENSITIVITY)    EKG None  Radiology DG Chest Port 1 View  Result Date: 07/22/2023 CLINICAL DATA:  Shortness of breath and chest pain EXAM: PORTABLE CHEST 1 VIEW COMPARISON:  Radiographs 11/20/2022 FINDINGS: Unchanged cardiomediastinal silhouette. Scattered bandlike atelectasis or scarring. Otherwise no focal consolidation. No pleural effusion or pneumothorax. The visualized skeletal structures are unremarkable. IMPRESSION: No active disease. Electronically Signed   By: Minerva Fester M.D.   On: 07/22/2023 21:57    Procedures Procedures    Medications Ordered in ED Medications  nitroGLYCERIN (NITROSTAT) SL tablet 0.4 mg (has no administration in time range)    ED Course/ Medical Decision Making/ A&P  Patient is a 52 year old female presenting with complaints as described in the HPI.  She arrives here with stable vital signs and is afebrile.  There is no hypoxia.  Physical  examination is basically unremarkable.  Workup initiated including CBC, metabolic panel, troponin, all of which are basically unremarkable.  COVID/flu/RSV all negative.  Chest x-ray shows no acute process.  Patient given Zithromax for presumed bronchitis.  Patient presenting with multiple complaints as described in the HPI.  Symptoms may be viral, but have been worsening over the past week.  Patient to be treated as a bronchitis with Zithromax and over-the-counter medications for symptom relief.  To return as needed/follow-up as needed.  Final Clinical Impression(s) / ED Diagnoses Final diagnoses:  None    Rx /  DC Orders ED Discharge Orders     None         Geoffery Lyons, MD 07/22/23 [phone]

## 2023-08-07 ENCOUNTER — Emergency Department (HOSPITAL_BASED_OUTPATIENT_CLINIC_OR_DEPARTMENT_OTHER): Payer: BC Managed Care – PPO

## 2023-08-07 ENCOUNTER — Emergency Department (HOSPITAL_BASED_OUTPATIENT_CLINIC_OR_DEPARTMENT_OTHER)
Admission: EM | Admit: 2023-08-07 | Discharge: 2023-08-08 | Disposition: A | Discharge status: Home / Self Care | Payer: BC Managed Care – PPO | Attending: Emergency Medicine | Admitting: Emergency Medicine

## 2023-08-07 DIAGNOSIS — N3000 Acute cystitis without hematuria: Secondary | ICD-10-CM | POA: Insufficient documentation

## 2023-08-07 DIAGNOSIS — A599 Trichomoniasis, unspecified: Secondary | ICD-10-CM | POA: Insufficient documentation

## 2023-08-07 DIAGNOSIS — R1030 Lower abdominal pain, unspecified: Secondary | ICD-10-CM | POA: Diagnosis present

## 2023-08-07 LAB — CBC
HCT: 38.9 % (ref 36.0–46.0)
Hemoglobin: 12.2 g/dL (ref 12.0–15.0)
MCH: 23.3 pg — ABNORMAL LOW (ref 26.0–34.0)
MCHC: 31.4 g/dL (ref 30.0–36.0)
MCV: 74.2 fL — ABNORMAL LOW (ref 80.0–100.0)
Platelets: 201 10*3/uL (ref 150–400)
RBC: 5.24 MIL/uL — ABNORMAL HIGH (ref 3.87–5.11)
RDW: 13.4 % (ref 11.5–15.5)
WBC: 8.3 10*3/uL (ref 4.0–10.5)
nRBC: 0 % (ref 0.0–0.2)

## 2023-08-07 LAB — URINALYSIS, ROUTINE W REFLEX MICROSCOPIC
Bilirubin Urine: NEGATIVE
Glucose, UA: NEGATIVE mg/dL
Hgb urine dipstick: NEGATIVE
Nitrite: NEGATIVE
Specific Gravity, Urine: 1.034 — ABNORMAL HIGH (ref 1.005–1.030)
pH: 5.5 (ref 5.0–8.0)

## 2023-08-07 LAB — COMPREHENSIVE METABOLIC PANEL
ALT: 11 U/L (ref 0–44)
AST: 10 U/L — ABNORMAL LOW (ref 15–41)
Albumin: 4 g/dL (ref 3.5–5.0)
Alkaline Phosphatase: 69 U/L (ref 38–126)
Anion gap: 10 (ref 5–15)
BUN: 22 mg/dL — ABNORMAL HIGH (ref 6–20)
CO2: 26 mmol/L (ref 22–32)
Calcium: 8.9 mg/dL (ref 8.9–10.3)
Chloride: 103 mmol/L (ref 98–111)
Creatinine, Ser: 1.11 mg/dL — ABNORMAL HIGH (ref 0.44–1.00)
GFR, Estimated: >60 mL/min (ref >60)
Glucose, Bld: 158 mg/dL — ABNORMAL HIGH (ref 70–99)
Potassium: 3.7 mmol/L (ref 3.5–5.1)
Sodium: 139 mmol/L (ref 135–145)
Total Bilirubin: 0.4 mg/dL (ref 0.3–1.2)
Total Protein: 6.8 g/dL (ref 6.5–8.1)

## 2023-08-07 LAB — WET PREP, GENITAL
Clue Cells Wet Prep HPF POC: NONE SEEN
Sperm: NONE SEEN
WBC, Wet Prep HPF POC: >=10 — AB (ref <10)
Yeast Wet Prep HPF POC: NONE SEEN

## 2023-08-07 LAB — LIPASE, BLOOD: Lipase: 39 U/L (ref 11–51)

## 2023-08-07 MED ORDER — IOHEXOL 350 MG/ML SOLN
100.0000 mL | Freq: Once | INTRAVENOUS | Status: AC | PRN
  Administered 2023-08-07: 85 mL via INTRAVENOUS

## 2023-08-07 MED ORDER — ONDANSETRON HCL 4 MG/2ML IJ SOLN
4.0000 mg | Freq: Once | INTRAMUSCULAR | Status: AC
  Administered 2023-08-07: 4 mg via INTRAVENOUS
  Filled 2023-08-07: qty 2

## 2023-08-07 MED ORDER — KETOROLAC TROMETHAMINE 15 MG/ML IJ SOLN
15.0000 mg | Freq: Once | INTRAMUSCULAR | Status: AC
  Administered 2023-08-07: 15 mg via INTRAVENOUS
  Filled 2023-08-07: qty 1

## 2023-08-07 NOTE — ED Provider Notes (Signed)
[place] EMERGENCY DEPARTMENT AT [facility] Provider Note   CSN: [id number] Arrival date & time: 08/07/23  1756     History {Add pertinent medical, surgical, social history, OB history to HPI:1} Chief Complaint  Patient presents with   Abdominal Pain    Kristia Sherratt Odell is a 52 y.o. female.  52 year old female who presents emergency department with lower abdominal pain since Saturday.  Reports that on Saturday she was going to pee and noticed that she had some vaginal bleeding.  Since then has been having lower abdominal cramping that feels like she is supposed have a menstrual cycle but reports that she has a partial hysterectomy and has not had period in years.  Worse with movement.  Says that it wraps all the way around to her back.  Has been constipated.  Nausea but no vomiting or diarrhea.  Also has been having some discomfort with urination.  Denies any fevers.  Has had a cholecystectomy but no other abdominal surgeries aside from the hysterectomy.       Home Medications Prior to Admission medications   Medication Sig Start Date End Date Taking? Authorizing Provider  Ascorbic Acid (VITAMIN C) 1000 MG tablet Take 1,000 mg by mouth daily.    [provider]  azithromycin (ZITHROMAX) 250 MG tablet Take 1 tablet (250 mg total) by mouth daily. 07/22/23   Geoffery Lyons, MD  carvedilol (COREG) 25 MG tablet Take 1 tablet (25 mg total) by mouth 2 (two) times daily. 01/03/20 04/02/20  Alver Sorrow, NP  Continuous Blood Gluc Sensor (DEXCOM G6 SENSOR) MISC SMARTSIG:1 Topical Every 10 Days 11/01/21   [provider]  Continuous Blood Gluc Transmit (DEXCOM G6 TRANSMITTER) MISC See admin instructions. 10/05/21   [provider]  diltiazem (CARDIZEM CD) 180 MG 24 hr capsule Take 180 mg by mouth daily. 11/26/21   [provider]  HUMALOG KWIKPEN 200 UNIT/ML KwikPen SMARTSIG:Unit(s) SUB-Q PRN 11/28/21   [provider]  ibuprofen (ADVIL) 800  MG tablet Take 800 mg by mouth every 6 (six) hours as needed. 11/22/21   [provider]  insulin NPH-regular Human (70-30) 100 UNIT/ML injection Inject 20 Units into the skin 2 (two) times daily with a meal.    [provider]  meloxicam (MOBIC) 15 MG tablet Take 1 tablet (15 mg total) by mouth daily. 02/09/22   Hyatt, Max T, DPM  metFORMIN (GLUCOPHAGE) 1000 MG tablet Take 1,000 mg by mouth 2 (two) times daily with a meal.    [provider]  methylPREDNISolone (MEDROL DOSEPAK) 4 MG TBPK tablet 6 day dose pack - take as directed 02/09/22   Hyatt, Max T, DPM  Olmesartan-amLODIPine-HCTZ 40-10-25 MG TABS Take 1 tablet by mouth daily. 04/04/22   [provider]  ondansetron (ZOFRAN-ODT) 4 MG disintegrating tablet Take 1 tablet (4 mg total) by mouth every 8 (eight) hours as needed for nausea or vomiting. 04/01/22   Alvira Monday, MD  topiramate (TOPAMAX) 25 MG tablet Take by mouth. 10/30/21   [provider]  TOUJEO SOLOSTAR 300 UNIT/ML Solostar Pen Inject into the skin. 09/30/21   [provider]  VITAMIN D PO Take by mouth daily.    [provider]      Allergies    Hydrocodone and Influenza virus vaccine    Review of Systems   Review of Systems  Physical Exam Updated Vital Signs BP (!) 203/103 (BP Location: Right Arm)   Pulse 63   Temp 98.6 F (37  C) (Oral)   Resp 16   Ht 5\' 6"  (1.676 m)   Wt 99.8 kg   LMP 04/03/2018   SpO2 100%   BMI 35.51 kg/m  Physical Exam Vitals and nursing note reviewed.  Constitutional:      General: She is not in acute distress.    Appearance: She is well-developed.  HENT:     Head: Normocephalic and atraumatic.     Right Ear: External ear normal.     Left Ear: External ear normal.     Nose: Nose normal.  Eyes:     Extraocular Movements: Extraocular movements intact.     Conjunctiva/sclera: Conjunctivae normal.     Pupils: Pupils are equal, round, and reactive to light.  Cardiovascular:      Rate and Rhythm: Normal rate and regular rhythm.  Pulmonary:     Effort: Pulmonary effort is normal. No respiratory distress.  Abdominal:     General: Abdomen is flat. There is no distension.     Palpations: Abdomen is soft. There is no mass.     Tenderness: There is abdominal tenderness (Lower abdomen). There is no guarding.  Musculoskeletal:     Cervical back: Normal range of motion and neck supple.  Skin:    General: Skin is warm and dry.  Neurological:     Mental Status: She is alert and oriented to person, place, and time. Mental status is at baseline.  Psychiatric:        Mood and Affect: Mood normal.     ED Results / Procedures / Treatments   Labs (all labs ordered are listed, but only abnormal results are displayed) Labs Reviewed  COMPREHENSIVE METABOLIC PANEL - Abnormal; Notable for the following components:      Result Value   Glucose, Bld 158 (*)    BUN 22 (*)    Creatinine, Ser 1.11 (*)    AST 10 (*)    All other components within normal limits  CBC - Abnormal; Notable for the following components:   RBC 5.24 (*)    MCV 74.2 (*)    MCH 23.3 (*)    All other components within normal limits  URINALYSIS, ROUTINE W REFLEX MICROSCOPIC - Abnormal; Notable for the following components:   Specific Gravity, Urine 1.034 (*)    Ketones, ur TRACE (*)    Protein, ur TRACE (*)    Leukocytes,Ua LARGE (*)    Bacteria, UA RARE (*)    All other components within normal limits  WET PREP, GENITAL  LIPASE, BLOOD  GC/CHLAMYDIA PROBE AMP ([place]) NOT AT [facility]    EKG None  Radiology No results found.  Procedures Procedures  {Document cardiac monitor, telemetry assessment procedure when appropriate:1}  Medications Ordered in ED Medications  ketorolac (TORADOL) 15 MG/ML injection 15 mg (has no administration in time range)    ED Course/ Medical Decision Making/ A&P   {   Click here for ABCD2, HEART and other calculatorsREFRESH Note before signing :1}                               Medical Decision Making Amount and/or Complexity of Data Reviewed Labs: ordered. Radiology: ordered.  Risk Prescription drug management.   ***  {Document critical care time when appropriate:1} {Document review of labs and clinical decision tools ie heart score, Chads2Vasc2 etc:1}  {Document your independent review of radiology images, and any outside records:1} {Document your discussion with family  members, caretakers, and with consultants:1} {Document social determinants of health affecting pt's care:1} {Document your decision making why or why not admission, treatments were needed:1} Final Clinical Impression(s) / ED Diagnoses Final diagnoses:  None    Rx / DC Orders ED Discharge Orders     None

## 2023-08-07 NOTE — ED Triage Notes (Addendum)
Pt to ED c/o lower abdominal pain and painful urination x 3 days. Denies vomiting, reports one episode of vaginal bleeding on Saturday. Reports last BM last night.

## 2023-08-08 MED ORDER — METRONIDAZOLE 500 MG PO TABS: 500.0000 mg | ORAL_TABLET | Freq: Two times a day (BID) | ORAL | 0 refills | Status: DC

## 2023-08-08 MED ORDER — CEPHALEXIN 500 MG PO CAPS: 500.0000 mg | ORAL_CAPSULE | Freq: Two times a day (BID) | ORAL | 0 refills | Status: AC

## 2023-08-08 NOTE — Discharge Instructions (Addendum)
You were seen for your abdominal pain in the emergency department.   At home, please take the keflex for your urinary tract infection and the flagyl for your trichomonas infection.    Check your MyChart online for the results of any tests that had not resulted by the time you left the emergency department.   Follow-up with your primary doctor in 2-3 days regarding your visit.  Please have any sexual partners treated for trichomonas as well so that you do not get reinfected.  Return immediately to the emergency department if you experience any of the following: Fevers, flank pain, or any other concerning symptoms.    Thank you for visiting our Emergency Department. It was a pleasure taking care of you today.

## 2023-08-10 LAB — GC/CHLAMYDIA PROBE AMP (~~LOC~~) NOT AT ARMC
Chlamydia: NEGATIVE
Comment: NEGATIVE
Comment: NORMAL
Neisseria Gonorrhea: NEGATIVE

## 2023-09-07 ENCOUNTER — Other Ambulatory Visit: Payer: Self-pay

## 2023-09-07 ENCOUNTER — Encounter (HOSPITAL_COMMUNITY): Payer: Self-pay

## 2023-09-07 ENCOUNTER — Emergency Department (HOSPITAL_COMMUNITY)
Admission: EM | Admit: 2023-09-07 | Discharge: 2023-09-07 | Disposition: A | Discharge status: Home / Self Care | Payer: BC Managed Care – PPO | Attending: Emergency Medicine | Admitting: Emergency Medicine

## 2023-09-07 ENCOUNTER — Emergency Department (HOSPITAL_COMMUNITY): Payer: BC Managed Care – PPO

## 2023-09-07 DIAGNOSIS — E1165 Type 2 diabetes mellitus with hyperglycemia: Secondary | ICD-10-CM | POA: Insufficient documentation

## 2023-09-07 DIAGNOSIS — Z7984 Long term (current) use of oral hypoglycemic drugs: Secondary | ICD-10-CM | POA: Diagnosis not present

## 2023-09-07 DIAGNOSIS — I10 Essential (primary) hypertension: Secondary | ICD-10-CM | POA: Insufficient documentation

## 2023-09-07 DIAGNOSIS — R55 Syncope and collapse: Secondary | ICD-10-CM

## 2023-09-07 DIAGNOSIS — R739 Hyperglycemia, unspecified: Secondary | ICD-10-CM

## 2023-09-07 DIAGNOSIS — Z79899 Other long term (current) drug therapy: Secondary | ICD-10-CM | POA: Diagnosis not present

## 2023-09-07 LAB — CBC WITH DIFFERENTIAL/PLATELET
Abs Immature Granulocytes: 0.01 10*3/uL (ref 0.00–0.07)
Basophils Absolute: 0 10*3/uL (ref 0.0–0.1)
Basophils Relative: 0 %
Eosinophils Absolute: 0.1 10*3/uL (ref 0.0–0.5)
Eosinophils Relative: 3 %
HCT: 41.7 % (ref 36.0–46.0)
Hemoglobin: 12.9 g/dL (ref 12.0–15.0)
Immature Granulocytes: 0 %
Lymphocytes Relative: 43 %
Lymphs Abs: 1.8 10*3/uL (ref 0.7–4.0)
MCH: 23.2 pg — ABNORMAL LOW (ref 26.0–34.0)
MCHC: 30.9 g/dL (ref 30.0–36.0)
MCV: 74.9 fL — ABNORMAL LOW (ref 80.0–100.0)
Monocytes Absolute: 0.3 10*3/uL (ref 0.1–1.0)
Monocytes Relative: 8 %
Neutro Abs: 1.9 10*3/uL (ref 1.7–7.7)
Neutrophils Relative %: 46 %
Platelets: 189 10*3/uL (ref 150–400)
RBC: 5.57 MIL/uL — ABNORMAL HIGH (ref 3.87–5.11)
RDW: 13 % (ref 11.5–15.5)
WBC: 4.2 10*3/uL (ref 4.0–10.5)
nRBC: 0 % (ref 0.0–0.2)

## 2023-09-07 LAB — COMPREHENSIVE METABOLIC PANEL
ALT: 19 U/L (ref 0–44)
AST: 15 U/L (ref 15–41)
Albumin: 3.9 g/dL (ref 3.5–5.0)
Alkaline Phosphatase: 83 U/L (ref 38–126)
Anion gap: 9 (ref 5–15)
BUN: 11 mg/dL (ref 6–20)
CO2: 26 mmol/L (ref 22–32)
Calcium: 9 mg/dL (ref 8.9–10.3)
Chloride: 100 mmol/L (ref 98–111)
Creatinine, Ser: 0.73 mg/dL (ref 0.44–1.00)
GFR, Estimated: >60 mL/min (ref >60)
Glucose, Bld: 388 mg/dL — ABNORMAL HIGH (ref 70–99)
Potassium: 3.6 mmol/L (ref 3.5–5.1)
Sodium: 135 mmol/L (ref 135–145)
Total Bilirubin: 0.4 mg/dL (ref 0.3–1.2)
Total Protein: 7.4 g/dL (ref 6.5–8.1)

## 2023-09-07 LAB — TROPONIN I (HIGH SENSITIVITY)
Troponin I (High Sensitivity): 13 ng/L (ref <18)
Troponin I (High Sensitivity): 14 ng/L (ref <18)

## 2023-09-07 LAB — CBG MONITORING, ED
Glucose-Capillary: 366 mg/dL — ABNORMAL HIGH (ref 70–99)
Glucose-Capillary: 319 mg/dL — ABNORMAL HIGH (ref 70–99)

## 2023-09-07 MED ORDER — INSULIN ASPART 100 UNIT/ML IJ SOLN
5.0000 [IU] | Freq: Once | INTRAMUSCULAR | Status: AC
  Administered 2023-09-07: 5 [IU] via SUBCUTANEOUS
  Filled 2023-09-07: qty 0.05

## 2023-09-07 MED ORDER — HYDROCHLOROTHIAZIDE 12.5 MG PO TABS
25.0000 mg | ORAL_TABLET | Freq: Once | ORAL | Status: AC
  Administered 2023-09-07: 25 mg via ORAL
  Filled 2023-09-07: qty 2

## 2023-09-07 MED ORDER — LACTATED RINGERS IV BOLUS
1000.0000 mL | Freq: Once | INTRAVENOUS | Status: AC
  Administered 2023-09-07: 1000 mL via INTRAVENOUS

## 2023-09-07 MED ORDER — AMLODIPINE BESYLATE 5 MG PO TABS
10.0000 mg | ORAL_TABLET | Freq: Once | ORAL | Status: AC
  Administered 2023-09-07: 10 mg via ORAL
  Filled 2023-09-07: qty 2

## 2023-09-07 MED ORDER — ONDANSETRON HCL 4 MG/2ML IJ SOLN
4.0000 mg | Freq: Once | INTRAMUSCULAR | Status: AC
  Administered 2023-09-07: 4 mg via INTRAVENOUS
  Filled 2023-09-07: qty 2

## 2023-09-07 MED ORDER — CARVEDILOL 12.5 MG PO TABS
25.0000 mg | ORAL_TABLET | Freq: Once | ORAL | Status: AC
  Administered 2023-09-07: 25 mg via ORAL
  Filled 2023-09-07: qty 2

## 2023-09-07 NOTE — Discharge Instructions (Signed)
You were seen in the emergency department after your episode of passing out.  Your blood sugar and blood pressure was high here but you had no complications from your blood pressure or blood sugar being high.  We did give you an extra dose of your blood pressure medication in the emergency department with improvement of your blood pressure as well as some fluids and insulin to improve your blood sugar.  You should keep a daily log of your blood pressure and blood sugar and make sure you are taking your medications as prescribed.  You should follow-up with your primary doctor and your cardiologist to have your symptoms and your blood pressure and blood sugar rechecked to see if you need any medication changes.  You should return to the emergency department if you are having recurrent episodes of passing out, severe chest pain or shortness of breath or any other new or concerning symptoms.

## 2023-09-07 NOTE — ED Triage Notes (Addendum)
Patient BIB [facility] from primary care office. Patient went in for a check up for her hypertension but stated she had a syncopal episode at 630am this morning. Fell back onto the bed. Feels weak and dizzy.

## 2023-09-07 NOTE — ED Provider Notes (Signed)
[facility]GENC[facility] DEPARTMENT AT [facility] Provider Note   CSN: [id number] Arrival date & time: 09/07/23  1039     History  Chief Complaint  Patient presents with   Hypertension    Grace Gallagher is a 52 y.o. female.  Patient is a 52 year old female with a past medical history of hypertension, diabetes and arthritis presenting to the emergency department after syncopal episode.  Patient reports that she woke up this morning feeling lightheaded and dizzy.  She states that she tried to stand at the side of her bed for about a minute but ended up passing out.  She states that she did fall back onto her bed so did not hit her head or injure herself.  She states that she thinks that she was unconscious for a few minutes.  She states that she did have a cardiology appointment this morning and her blood pressure was found to be elevated to the 180s so she was recommended to come to the emergency department for further evaluation.  Patient states that her blood pressure does normally run around the 150s.  She states that she did take her blood pressure medication this morning.  Patient reports after the syncopal episode she did have a brief period of chest pain.  She denies any shortness of breath.  She does report associated nausea but denies any vomiting.  She states that yesterday she felt like her normal self.  She does report on her route with EMS to the emergency department her blood sugar was additionally elevated to 400.  The history is provided by the patient.  Hypertension       Home Medications Prior to Admission medications   Medication Sig Start Date End Date Taking? Authorizing Provider  albuterol (VENTOLIN HFA) 108 (90 Base) MCG/ACT inhaler Inhale 1-2 puffs into the lungs every 6 (six) hours as needed for wheezing or shortness of breath.   Yes [provider]  Ascorbic Acid (VITAMIN C) 1000 MG tablet Take 1,000 mg by mouth 3 (three) times a week.   Yes  [provider]  BAYER ASPIRIN 325 MG tablet Take 325 mg by mouth daily.   Yes [provider]  carvedilol (COREG) 25 MG tablet Take 1 tablet (25 mg total) by mouth 2 (two) times daily. 01/03/20 09/07/23 Yes Alver Sorrow, NP  Cholecalciferol (VITAMIN D3) 1000 units CAPS Take 1,000 Units by mouth daily.   Yes [provider]  diclofenac Sodium (VOLTAREN) 1 % GEL Apply 2 g topically 4 (four) times daily as needed (for pain- affected sites).   Yes [provider]  diltiazem (CARDIZEM CD) 360 MG 24 hr capsule Take 360 mg by mouth daily.   Yes [provider]  DULoxetine (CYMBALTA) 60 MG capsule Take 120 mg by mouth in the morning.   Yes [provider]  HUMULIN R U-500 KWIKPEN 500 UNIT/ML KwikPen Inject 55 Units into the skin daily with breakfast.   Yes [provider]  metFORMIN (GLUCOPHAGE-XR) 500 MG 24 hr tablet Take 1,000 mg by mouth in the morning and at bedtime.   Yes [provider]  olmesartan (BENICAR) 40 MG tablet Take 40 mg by mouth daily.   Yes [provider]  traMADol (ULTRAM) 50 MG tablet Take 50 mg by mouth every 6 (six) hours as needed (for pain).   Yes [provider]  azithromycin (ZITHROMAX) 250 MG tablet Take 1 tablet (250 mg total) by mouth daily. Patient not taking: Reported  on 09/07/2023 07/22/23   Geoffery Lyons, MD  meloxicam (MOBIC) 15 MG tablet Take 1 tablet (15 mg total) by mouth daily. Patient not taking: Reported on 09/07/2023 02/09/22   Hyatt, Max T, DPM  methylPREDNISolone (MEDROL DOSEPAK) 4 MG TBPK tablet 6 day dose pack - take as directed Patient not taking: Reported on 09/07/2023 02/09/22   Hyatt, Max T, DPM  metroNIDAZOLE (FLAGYL) 500 MG tablet Take 1 tablet (500 mg total) by mouth 2 (two) times daily. Patient not taking: Reported on 09/07/2023 08/08/23   Rondel Baton, MD  ondansetron (ZOFRAN-ODT) 4 MG disintegrating tablet Take 1 tablet (4 mg total) by mouth every 8 (eight)  hours as needed for nausea or vomiting. Patient not taking: Reported on 09/07/2023 04/01/22   Alvira Monday, MD      Allergies    Hydrocodone, Haemophilus b polysaccharide vaccine, Influenza virus vaccine, and Egg-derived products    Review of Systems   Review of Systems  Physical Exam Updated Vital Signs BP (!) 168/98   Pulse 71   Temp 98.1 F (36.7 C) (Oral)   Resp 18   Ht 5\' 6"  (1.676 m)   Wt 102.1 kg   LMP 04/03/2018   SpO2 97%   BMI 36.32 kg/m  Physical Exam Vitals and nursing note reviewed.  Constitutional:      General: She is not in acute distress.    Appearance: Normal appearance.  HENT:     Head: Normocephalic and atraumatic.     Nose: Nose normal.     Mouth/Throat:     Mouth: Mucous membranes are moist.     Pharynx: Oropharynx is clear.  Eyes:     Extraocular Movements: Extraocular movements intact.     Conjunctiva/sclera: Conjunctivae normal.     Pupils: Pupils are equal, round, and reactive to light.     Comments: No nystagmus  Cardiovascular:     Rate and Rhythm: Normal rate and regular rhythm.     Pulses: Normal pulses.     Heart sounds: Normal heart sounds.  Pulmonary:     Effort: Pulmonary effort is normal.     Breath sounds: Normal breath sounds.  Abdominal:     General: Abdomen is flat.     Palpations: Abdomen is soft.     Tenderness: There is no abdominal tenderness.  Musculoskeletal:        General: Normal range of motion.     Cervical back: Normal range of motion.  Skin:    General: Skin is warm and dry.  Neurological:     General: No focal deficit present.     Mental Status: She is alert and oriented to person, place, and time.     Cranial Nerves: No cranial nerve deficit.     Sensory: No sensory deficit.     Motor: No weakness.     Coordination: Coordination normal.  Psychiatric:        Mood and Affect: Mood normal.        Behavior: Behavior normal.     ED Results / Procedures / Treatments   Labs (all labs ordered are  listed, but only abnormal results are displayed) Labs Reviewed  COMPREHENSIVE METABOLIC PANEL - Abnormal; Notable for the following components:      Result Value   Glucose, Bld 388 (*)    All other components within normal limits  CBC WITH DIFFERENTIAL/PLATELET - Abnormal; Notable for the following components:   RBC 5.57 (*)    MCV 74.9 (*)  MCH 23.2 (*)    All other components within normal limits  CBG MONITORING, ED - Abnormal; Notable for the following components:   Glucose-Capillary 366 (*)    All other components within normal limits  CBG MONITORING, ED - Abnormal; Notable for the following components:   Glucose-Capillary 319 (*)    All other components within normal limits  URINALYSIS, ROUTINE W REFLEX MICROSCOPIC  TROPONIN I (HIGH SENSITIVITY)  TROPONIN I (HIGH SENSITIVITY)    EKG EKG Interpretation Date/Time:  Thursday September 07 2023 11:33:38 EDT Ventricular Rate:  65 PR Interval:  179 QRS Duration:  96 QT Interval:  440 QTC Calculation: 458 R Axis:   52  Text Interpretation: Sinus rhythm Consider right atrial enlargement Probable LVH with secondary repol abnrm No significant change since last tracing Confirmed by Elayne Snare ([id number]) on 09/07/2023 11:43:13 AM  Radiology DG Chest Port 1 View  Result Date: 09/07/2023 CLINICAL DATA:  Syncope.  Weakness. EXAM: PORTABLE CHEST 1 VIEW COMPARISON:  Chest radiograph dated July 22, 2023. FINDINGS: The cardiomediastinal silhouette is unchanged. Similar streaky linear scarring or atelectasis at the right lower lung and left midlung zones. No focal consolidation. No pneumothorax or sizable pleural effusion. No acute osseous abnormality. IMPRESSION: No acute cardiopulmonary findings. Electronically Signed   By: Hart Robinsons M.D.   On: 09/07/2023 13:05    Procedures Procedures    Medications Ordered in ED Medications  lactated ringers bolus 1,000 mL (1,000 mLs Intravenous New Bag/Given 09/07/23 1144)  ondansetron  (ZOFRAN) injection 4 mg (4 mg Intravenous Given 09/07/23 1144)  insulin aspart (novoLOG) injection 5 Units (5 Units Subcutaneous Given 09/07/23 1313)  carvedilol (COREG) tablet 25 mg (25 mg Oral Given 09/07/23 1355)  amLODipine (NORVASC) tablet 10 mg (10 mg Oral Given 09/07/23 1355)  hydrochlorothiazide (HYDRODIURIL) tablet 25 mg (25 mg Oral Given 09/07/23 1355)    ED Course/ Medical Decision Making/ A&P Clinical Course as of 09/07/23 1538  Thu Sep 07, 2023  1240 Hyperglycemia without DKA. Initial troponin normal. Will need repeat with symptoms starting shortly prior to arrival. [VK]  1306 Orthostatics normal, glucose remains high and will be given insulin. [VK]  1403 Repeat troponin normal. Patient just received BP meds. Will have BP and dizziness reassessed after time for medications to have an effect. [VK]  1535 Patient's blood pressure has improved to the 160s.  She is otherwise stable for discharge home.  She was recommended outpatient primary care and cardiology follow-up and was given strict return precautions. [VK]    Clinical Course User Index [VK] Rexford Maus, DO                                 Medical Decision Making This patient presents to the ED with chief complaint(s) of syncope, HTN with pertinent past medical history of HTN, DM, HLD, arthritis which further complicates the presenting complaint. The complaint involves an extensive differential diagnosis and also carries with it a high risk of complications and morbidity.    The differential diagnosis includes ACS, arrhythmia, anemia, pneumonia, pneumothorax, pulmonary edema, pleural effusion, dehydration, electrolyte abnormality, Orthostatic hypotension, hyperglycemic crisis  Additional history obtained: Additional history obtained from N/A Records reviewed Care Everywhere/External Records - outpatient cardiology clinic records  ED Course and Reassessment: Patient's arrival to the emergency department she is  hypertensive to the 180s and otherwise hemodynamically stable in no acute distress.  The patient will have EKG, labs and orthostatic  vital signs performed.  She will be started on fluids and given nausea control and will be closely reassessed.  Independent labs interpretation:  The following labs were independently interpreted: Hyperglycemia, otherwise no acute abnormality  Independent visualization of imaging: - I independently visualized the following imaging with scope of interpretation limited to determining acute life threatening conditions related to emergency care: Chest x-ray, which revealed no acute disease  Consultation: - Consulted or discussed management/test interpretation w/ external professional: N/A  Consideration for admission or further workup: Patient has no emergent conditions requiring admission or further work-up at this time and is stable for discharge home with primary care and cardiology follow-up  Social Determinants of health: N/A     Amount and/or Complexity of Data Reviewed Labs: ordered. Radiology: ordered.  Risk Prescription drug management.          Final Clinical Impression(s) / ED Diagnoses Final diagnoses:  Hypertension, unspecified type  Syncope, unspecified syncope type  Hyperglycemia    Rx / DC Orders ED Discharge Orders     None         Rexford Maus, DO 09/07/23 1538

## 2023-12-28 ENCOUNTER — Emergency Department (HOSPITAL_COMMUNITY): Payer: 59

## 2023-12-28 ENCOUNTER — Encounter (HOSPITAL_COMMUNITY): Payer: Self-pay

## 2023-12-28 ENCOUNTER — Other Ambulatory Visit: Payer: Self-pay

## 2023-12-28 ENCOUNTER — Emergency Department (HOSPITAL_COMMUNITY)
Admission: EM | Admit: 2023-12-28 | Discharge: 2023-12-28 | Disposition: A | Discharge status: Home / Self Care | Payer: 59 | Attending: Emergency Medicine | Admitting: Emergency Medicine

## 2023-12-28 DIAGNOSIS — Z794 Long term (current) use of insulin: Secondary | ICD-10-CM | POA: Diagnosis not present

## 2023-12-28 DIAGNOSIS — E1165 Type 2 diabetes mellitus with hyperglycemia: Secondary | ICD-10-CM | POA: Insufficient documentation

## 2023-12-28 DIAGNOSIS — Z7984 Long term (current) use of oral hypoglycemic drugs: Secondary | ICD-10-CM | POA: Insufficient documentation

## 2023-12-28 DIAGNOSIS — I10 Essential (primary) hypertension: Secondary | ICD-10-CM | POA: Insufficient documentation

## 2023-12-28 DIAGNOSIS — Z20822 Contact with and (suspected) exposure to covid-19: Secondary | ICD-10-CM | POA: Diagnosis not present

## 2023-12-28 DIAGNOSIS — R0602 Shortness of breath: Secondary | ICD-10-CM | POA: Insufficient documentation

## 2023-12-28 DIAGNOSIS — Z79899 Other long term (current) drug therapy: Secondary | ICD-10-CM | POA: Diagnosis not present

## 2023-12-28 DIAGNOSIS — R739 Hyperglycemia, unspecified: Secondary | ICD-10-CM

## 2023-12-28 LAB — CBC
HCT: 44.1 % (ref 36.0–46.0)
Hemoglobin: 13.1 g/dL (ref 12.0–15.0)
MCH: 22.6 pg — ABNORMAL LOW (ref 26.0–34.0)
MCHC: 29.7 g/dL — ABNORMAL LOW (ref 30.0–36.0)
MCV: 76.2 fL — ABNORMAL LOW (ref 80.0–100.0)
Platelets: 184 10*3/uL (ref 150–400)
RBC: 5.79 MIL/uL — ABNORMAL HIGH (ref 3.87–5.11)
RDW: 13.2 % (ref 11.5–15.5)
WBC: 4.6 10*3/uL (ref 4.0–10.5)
nRBC: 0 % (ref 0.0–0.2)

## 2023-12-28 LAB — COMPREHENSIVE METABOLIC PANEL
ALT: 32 U/L (ref 0–44)
AST: 21 U/L (ref 15–41)
Albumin: 4.1 g/dL (ref 3.5–5.0)
Alkaline Phosphatase: 105 U/L (ref 38–126)
Anion gap: 14 (ref 5–15)
BUN: 14 mg/dL (ref 6–20)
CO2: 23 mmol/L (ref 22–32)
Calcium: 9.7 mg/dL (ref 8.9–10.3)
Chloride: 99 mmol/L (ref 98–111)
Creatinine, Ser: 0.87 mg/dL (ref 0.44–1.00)
GFR, Estimated: >60 mL/min (ref >60)
Glucose, Bld: 645 mg/dL (ref 70–99)
Potassium: 4 mmol/L (ref 3.5–5.1)
Sodium: 136 mmol/L (ref 135–145)
Total Bilirubin: 0.5 mg/dL (ref 0.0–1.2)
Total Protein: 7.6 g/dL (ref 6.5–8.1)

## 2023-12-28 LAB — URINALYSIS, ROUTINE W REFLEX MICROSCOPIC
Bacteria, UA: NONE SEEN
Bilirubin Urine: NEGATIVE
Glucose, UA: >=500 mg/dL — AB
Hgb urine dipstick: NEGATIVE
Ketones, ur: NEGATIVE mg/dL
Leukocytes,Ua: NEGATIVE
Nitrite: NEGATIVE
Protein, ur: NEGATIVE mg/dL
Specific Gravity, Urine: 1.022 (ref 1.005–1.030)
pH: 8 (ref 5.0–8.0)

## 2023-12-28 LAB — CBG MONITORING, ED
Glucose-Capillary: 562 mg/dL (ref 70–99)
Glucose-Capillary: 378 mg/dL — ABNORMAL HIGH (ref 70–99)

## 2023-12-28 LAB — RESP PANEL BY RT-PCR (RSV, FLU A&B, COVID)  RVPGX2
Influenza A by PCR: NEGATIVE
Influenza B by PCR: NEGATIVE
Resp Syncytial Virus by PCR: NEGATIVE
SARS Coronavirus 2 by RT PCR: NEGATIVE

## 2023-12-28 LAB — TROPONIN I (HIGH SENSITIVITY)
Troponin I (High Sensitivity): 14 ng/L (ref <18)
Troponin I (High Sensitivity): 15 ng/L (ref <18)

## 2023-12-28 LAB — BETA-HYDROXYBUTYRIC ACID: Beta-Hydroxybutyric Acid: 0.16 mmol/L (ref 0.05–0.27)

## 2023-12-28 MED ORDER — DEXTROSE IN LACTATED RINGERS 5 % IV SOLN: INTRAVENOUS | Status: DC

## 2023-12-28 MED ORDER — DEXTROSE 50 % IV SOLN: 0.0000 mL | INTRAVENOUS | Status: DC | PRN

## 2023-12-28 MED ORDER — LACTATED RINGERS IV SOLN: INTRAVENOUS | Status: DC

## 2023-12-28 MED ORDER — CARVEDILOL 12.5 MG PO TABS
25.0000 mg | ORAL_TABLET | Freq: Once | ORAL | Status: AC
  Administered 2023-12-28: 25 mg via ORAL
  Filled 2023-12-28: qty 2

## 2023-12-28 MED ORDER — INSULIN REGULAR(HUMAN) IN NACL 100-0.9 UT/100ML-% IV SOLN
INTRAVENOUS | Status: DC
  Filled 2023-12-28: qty 100

## 2023-12-28 NOTE — ED Provider Notes (Addendum)
[place] EMERGENCY DEPARTMENT AT [facility] Provider Note   CSN: [id number] Arrival date & time: 12/28/23  1201     History  Chief Complaint  Patient presents with   Shortness of Breath   Hyperglycemia    Grace Gallagher is a 53 y.o. female with history of type 2 diabetes on insulin, hypertension, obesity, presents with concern for an elevated blood pressure.  States her home nurse reported she had a her blood pressure today, and wanted her to come to the ER.  She reports her blood pressures normally at home have a systolic of 180s.  She says she did not take her blood pressure medications today yet.  She reports a slight headache, but otherwise feels at her baseline.  Denies any nausea, vomiting, vision changes.  Also reports a cough that has been ongoing for the past couple weeks as well as some associated shortness of breath. Denies any chest pain.   Shortness of Breath Hyperglycemia Associated symptoms: shortness of breath        Home Medications Prior to Admission medications   Medication Sig Start Date End Date Taking? Authorizing Provider  albuterol (VENTOLIN HFA) 108 (90 Base) MCG/ACT inhaler Inhale 1-2 puffs into the lungs every 6 (six) hours as needed for wheezing or shortness of breath.    [provider]  Ascorbic Acid (VITAMIN C) 1000 MG tablet Take 1,000 mg by mouth 3 (three) times a week.    [provider]  azithromycin (ZITHROMAX) 250 MG tablet Take 1 tablet (250 mg total) by mouth daily. Patient not taking: Reported on 09/07/2023 07/22/23   Geoffery Lyons, MD  BAYER ASPIRIN 325 MG tablet Take 325 mg by mouth daily.    [provider]  carvedilol (COREG) 25 MG tablet Take 1 tablet (25 mg total) by mouth 2 (two) times daily. 01/03/20 09/07/23  Alver Sorrow, NP  Cholecalciferol (VITAMIN D3) 1000 units CAPS Take 1,000 Units by mouth daily.    [provider]  diclofenac Sodium (VOLTAREN) 1 % GEL Apply 2 g topically 4  (four) times daily as needed (for pain- affected sites).    [provider]  diltiazem (CARDIZEM CD) 360 MG 24 hr capsule Take 360 mg by mouth daily.    [provider]  DULoxetine (CYMBALTA) 60 MG capsule Take 120 mg by mouth in the morning.    [provider]  HUMULIN R U-500 KWIKPEN 500 UNIT/ML KwikPen Inject 55 Units into the skin daily with breakfast.    [provider]  meloxicam (MOBIC) 15 MG tablet Take 1 tablet (15 mg total) by mouth daily. Patient not taking: Reported on 09/07/2023 02/09/22   Ernestene Kiel T, DPM  metFORMIN (GLUCOPHAGE-XR) 500 MG 24 hr tablet Take 1,000 mg by mouth in the morning and at bedtime.    [provider]  methylPREDNISolone (MEDROL DOSEPAK) 4 MG TBPK tablet 6 day dose pack - take as directed Patient not taking: Reported on 09/07/2023 02/09/22   Hyatt, Max T, DPM  metroNIDAZOLE (FLAGYL) 500 MG tablet Take 1 tablet (500 mg total) by mouth 2 (two) times daily. Patient not taking: Reported on 09/07/2023 08/08/23   Rondel Baton, MD  olmesartan (BENICAR) 40 MG tablet Take 40 mg by mouth daily.    [provider]  ondansetron (ZOFRAN-ODT) 4 MG disintegrating tablet Take 1 tablet (4 mg total) by mouth every 8 (eight) hours as needed for nausea or vomiting. Patient not taking: Reported on 09/07/2023 04/01/22  Alvira Monday, MD  traMADol (ULTRAM) 50 MG tablet Take 50 mg by mouth every 6 (six) hours as needed (for pain).    [provider]      Allergies    Hydrocodone, Egg-derived products, Haemophilus b polysaccharide vaccine, Influenza virus vaccine, and Metformin and related    Review of Systems   Review of Systems  Respiratory:  Positive for shortness of breath.     Physical Exam Updated Vital Signs BP (!) 161/112 (BP Location: Left Arm)   Pulse 70   Temp 97.8 F (36.6 C) (Oral)   Resp 18   Ht 5\' 6"  (1.676 m)   Wt 102 kg   LMP 04/03/2018   SpO2 99%   BMI 36.29 kg/m  Physical Exam Vitals  and nursing note reviewed.  Constitutional:      General: She is not in acute distress.    Appearance: She is well-developed.     Comments: Very well appearing, no nausea or vomiting  HENT:     Head: Normocephalic and atraumatic.  Eyes:     Conjunctiva/sclera: Conjunctivae normal.  Cardiovascular:     Rate and Rhythm: Normal rate and regular rhythm.     Heart sounds: No murmur heard. Pulmonary:     Effort: Pulmonary effort is normal. No respiratory distress.     Breath sounds: Normal breath sounds.     Comments: Breathing comfortably on room air, talking in full sentences Abdominal:     Palpations: Abdomen is soft.     Tenderness: There is no abdominal tenderness.  Musculoskeletal:        General: No swelling.     Cervical back: Neck supple.     Right lower leg: No edema.     Left lower leg: No edema.  Skin:    General: Skin is warm and dry.     Capillary Refill: Capillary refill takes less than 2 seconds.  Neurological:     Mental Status: She is alert.  Psychiatric:        Mood and Affect: Mood normal.     ED Results / Procedures / Treatments   Labs (all labs ordered are listed, but only abnormal results are displayed) Labs Reviewed  CBC - Abnormal; Notable for the following components:      Result Value   RBC 5.79 (*)    MCV 76.2 (*)    MCH 22.6 (*)    MCHC 29.7 (*)    All other components within normal limits  URINALYSIS, ROUTINE W REFLEX MICROSCOPIC - Abnormal; Notable for the following components:   Color, Urine COLORLESS (*)    Glucose, UA >=500 (*)    All other components within normal limits  COMPREHENSIVE METABOLIC PANEL - Abnormal; Notable for the following components:   Glucose, Bld 645 (*)    All other components within normal limits  CBG MONITORING, ED - Abnormal; Notable for the following components:   Glucose-Capillary 562 (*)    All other components within normal limits  CBG MONITORING, ED - Abnormal; Notable for the following components:    Glucose-Capillary 378 (*)    All other components within normal limits  RESP PANEL BY RT-PCR (RSV, FLU A&B, COVID)  RVPGX2  BETA-HYDROXYBUTYRIC ACID  I-STAT VENOUS BLOOD GAS, ED  TROPONIN I (HIGH SENSITIVITY)  TROPONIN I (HIGH SENSITIVITY)    EKG None  Radiology DG Chest 2 View Result Date: 12/28/2023 CLINICAL DATA:  Shortness of breath.  Elevated blood pressure. EXAM: CHEST - 2 VIEW COMPARISON:  09/07/2023 FINDINGS: Stable linear densities in the left mid lung and right lower lung. Linear densities are suggestive for areas of mild scarring. No new airspace disease or lung consolidation. Heart size is normal. Trachea is midline. No large pleural effusions. No acute bone abnormality. IMPRESSION: 1. No acute cardiopulmonary disease. 2. Stable linear densities in the lungs, likely scarring. Electronically Signed   By: Richarda Overlie M.D.   On: 12/28/2023 13:46    Procedures Procedures    Medications Ordered in ED Medications  carvedilol (COREG) tablet 25 mg (25 mg Oral Given 12/28/23 1235)    ED Course/ Medical Decision Making/ A&P                                 Medical Decision Making Amount and/or Complexity of Data Reviewed Labs: ordered. Radiology: ordered.  Risk Prescription drug management.     Differential diagnosis includes but is not limited to DKA, HHS, hyperglycemia, hypertension, hypertensive emergency, COVID, flu, RSV, viral URI, strep pharyngitis, viral pharyngitis, allergic rhinitis, pneumonia, bronchitis   ED Course:  Patient very well-appearing, no acute distress.  Vital signs stable upon arrival aside from a elevated blood pressure of 218/104.  She states her home nurse wanted her come in because of this high blood pressure, but also reported she not take her blood pressure medication this morning.  Her home carvedilol was given and blood pressure improved to 161/112.  She states her normal systolic at home is in the 180s.  I reviewed PCP note from 11/24/2023  where her blood pressure was 160/108 there. She seems to be at her baseline BP today. Denies any symptoms such as nausea or vomiting, headache, vision changes.  No elevation in LFTs, creatinine within normal limits. No concern for hypertensive emergency at this time. Patient also reported cough for the past couple weeks with associated shortness of breath. Stating 99% on room air and talking in full sentences.  Chest x-ray without signs of pneumonia.  COVID, flu, RSV negative. Initial and repeat troponin at 14 and 15, no chest pain, EKG with normal sinus rhythm, no concern for ACS. No lower extremity edema. Suspect bronchitis. Patient found to have elevated glucose of 645 upon arrival. States she has not taken her insulin this morning.  She denies any nausea or vomiting or abdominal pain. Very well appearing.  No anion gap, beta hydroxybutyric acid within normal limits. No concern for DKA or HHS.  Was going to treat her hyperglycemia, but upon repeat CBG glucose 378. Will hold on giving insulin infusion. Patient stable to manage blood sugars at home with normal insulin regimen   Patient stable and appropriate for discharge home at this time   Impression: Hypertension Hyperglycemia  Disposition:  The patient was discharged home with instructions to follow-up with PCP within the next 2 weeks for further discussion of blood pressure control and blood sugar control.  Was advised to continue taking her home blood pressure medications as prescribed. Return precautions given.  Imaging Studies ordered: I ordered imaging studies including chest x-ray I independently visualized the imaging with scope of interpretation limited to determining acute life threatening conditions related to emergency care. Imaging showed no acute abnormalities I agree with the radiologist interpretation   External records from outside source obtained and reviewed including PCP note from 11/27/24 where patient's blood pressure  was 160/108  Final Clinical Impression(s) / ED Diagnoses Final diagnoses:  Hyperglycemia  Hypertension, unspecified type    Rx / DC Orders ED Discharge Orders     None         Arabella Merles, PA-C 12/28/23 1806    Arabella Merles, PA-C 12/28/23 Merrily Brittle    Pricilla Loveless, MD 12/29/23 [phone]

## 2023-12-28 NOTE — Discharge Instructions (Addendum)
Your blood sugar and blood pressure was elevated here today.  Please follow-up with your PCP within the next 2 weeks regarding further blood pressure and blood sugar management.  Please continue taking your home medications as prescribed.  You appear to have an upper respiratory infection (URI) that has caused inflammation of the lung passageways (bronchitis). You may have a lingering cough that lasts for 2- 4 weeks after the infection.  Your flu, covid, and RSV test were negative today   Your chest x-ray did not show any signs of pneumonia  There are no medications, such as antibiotics, that will cure your infection.   Home care instructions:  You can take Tylenol and/or Ibuprofen as directed on the packaging for fever reduction and pain relief.    For cough: honey 1/2 to 1 teaspoon (you can dilute the honey in water or another fluid).  You can also use guaifenesin and dextromethorphan for cough which are over-the-counter medications. You can use a humidifier for chest congestion and cough.  If you don't have a humidifier, you can sit in the bathroom with the hot shower running.      For sore throat: try warm salt water gargles, cepacol lozenges, throat spray, warm tea or water with lemon/honey, popsicles or ice, or OTC cold relief medicine for throat discomfort.    For congestion: Flonase 1-2 sprays in each nostril daily.    It is important to stay hydrated: drink plenty of fluids (water, gatorade/powerade/pedialyte, juices, or teas) to keep your throat moisturized and help further relieve irritation/discomfort.    Take basic precautions such as washing your hands often, covering your mouth when you cough or sneeze, and avoiding public places where you could spread your illness to others.   Return the ER for any uncontrolled nausea or vomiting, abdominal pain, severe headache, any other new or concerning symptoms

## 2023-12-28 NOTE — ED Provider Triage Note (Addendum)
Emergency Medicine Provider Triage Evaluation Note  Grace Gallagher , a 53 y.o. female  was evaluated in triage.  Pt complains of high blood pressure taken at home today. Also reports that she may have bronchitis- says she has a scratchy throat and cough and shortness of breath. Was tested for flu and covid and states these are negative. Denies chest pain  States systolic normally in 180's at home. Has not taken BP meds today. Reports slight headache today.No nausea, vomiting, vision changes. Feels otherwise at baseline  Review of Systems  Positive: As above Negative: As above  Physical Exam  BP (!) 218/104 (BP Location: Left Arm)   Pulse 68   Temp 98.2 F (36.8 C) (Oral)   Resp 16   Ht 5\' 6"  (1.676 m)   Wt 102 kg   LMP 04/03/2018   SpO2 99%   BMI 36.29 kg/m  Gen:   Awake, no distress   Resp:  Normal effort  MSK:   Moves extremities without difficulty    Medical Decision Making  Medically screening exam initiated at 12:21 PM.  Appropriate orders placed.  Grace Gallagher was informed that the remainder of the evaluation will be completed by another provider, this initial triage assessment does not replace that evaluation, and the importance of remaining in the ED until their evaluation is complete.     Arabella Merles, PA-C 12/28/23 1228    Arabella Merles, PA-C 12/28/23 1228

## 2023-12-28 NOTE — ED Triage Notes (Signed)
HH came to see her and found 204/140 and CBG was HI. She complains of shortness of breath and has been diagnosed with Bronchitis.   EMS vitals: 204/130 BP 66 HR 14 RR 97% SPO2 on room air 97.6 Temp HI CBG

## 2024-05-13 ENCOUNTER — Emergency Department (HOSPITAL_COMMUNITY)

## 2024-05-13 ENCOUNTER — Encounter (HOSPITAL_COMMUNITY): Payer: Self-pay

## 2024-05-13 ENCOUNTER — Other Ambulatory Visit: Payer: Self-pay

## 2024-05-13 ENCOUNTER — Emergency Department (HOSPITAL_COMMUNITY)
Admission: EM | Admit: 2024-05-13 | Discharge: 2024-05-13 | Disposition: A | Discharge status: Home / Self Care | Attending: Emergency Medicine | Admitting: Emergency Medicine

## 2024-05-13 DIAGNOSIS — I160 Hypertensive urgency: Secondary | ICD-10-CM | POA: Insufficient documentation

## 2024-05-13 DIAGNOSIS — I729 Aneurysm of unspecified site: Secondary | ICD-10-CM | POA: Insufficient documentation

## 2024-05-13 DIAGNOSIS — Z7982 Long term (current) use of aspirin: Secondary | ICD-10-CM | POA: Diagnosis not present

## 2024-05-13 DIAGNOSIS — R519 Headache, unspecified: Secondary | ICD-10-CM | POA: Diagnosis present

## 2024-05-13 LAB — BASIC METABOLIC PANEL WITH GFR
Anion gap: 8 (ref 5–15)
BUN: 16 mg/dL (ref 6–20)
CO2: 23 mmol/L (ref 22–32)
Calcium: 8.7 mg/dL — ABNORMAL LOW (ref 8.9–10.3)
Chloride: 107 mmol/L (ref 98–111)
Creatinine, Ser: 0.92 mg/dL (ref 0.44–1.00)
GFR, Estimated: >60 mL/min (ref >60)
Glucose, Bld: 201 mg/dL — ABNORMAL HIGH (ref 70–99)
Potassium: 3.8 mmol/L (ref 3.5–5.1)
Sodium: 138 mmol/L (ref 135–145)

## 2024-05-13 LAB — CBC
HCT: 38.2 % (ref 36.0–46.0)
Hemoglobin: 11.7 g/dL — ABNORMAL LOW (ref 12.0–15.0)
MCH: 23 pg — ABNORMAL LOW (ref 26.0–34.0)
MCHC: 30.6 g/dL (ref 30.0–36.0)
MCV: 75.2 fL — ABNORMAL LOW (ref 80.0–100.0)
Platelets: 170 10*3/uL (ref 150–400)
RBC: 5.08 MIL/uL (ref 3.87–5.11)
RDW: 14.4 % (ref 11.5–15.5)
WBC: 5.7 10*3/uL (ref 4.0–10.5)
nRBC: 0 % (ref 0.0–0.2)

## 2024-05-13 LAB — TROPONIN I (HIGH SENSITIVITY)
Troponin I (High Sensitivity): 16 ng/L (ref <18)
Troponin I (High Sensitivity): 20 ng/L — ABNORMAL HIGH (ref <18)

## 2024-05-13 LAB — I-STAT CHEM 8, ED
BUN: 20 mg/dL (ref 6–20)
Calcium, Ion: 1.15 mmol/L (ref 1.15–1.40)
Chloride: 106 mmol/L (ref 98–111)
Creatinine, Ser: 1 mg/dL (ref 0.44–1.00)
Glucose, Bld: 206 mg/dL — ABNORMAL HIGH (ref 70–99)
HCT: 39 % (ref 36.0–46.0)
Hemoglobin: 13.3 g/dL (ref 12.0–15.0)
Potassium: 3.8 mmol/L (ref 3.5–5.1)
Sodium: 140 mmol/L (ref 135–145)
TCO2: 25 mmol/L (ref 22–32)

## 2024-05-13 MED ORDER — IOHEXOL 350 MG/ML SOLN
75.0000 mL | Freq: Once | INTRAVENOUS | Status: AC | PRN
  Administered 2024-05-13: 75 mL via INTRAVENOUS

## 2024-05-13 MED ORDER — HYDRALAZINE HCL 25 MG PO TABS
25.0000 mg | ORAL_TABLET | Freq: Once | ORAL | Status: AC
  Administered 2024-05-13: 25 mg via ORAL
  Filled 2024-05-13: qty 1

## 2024-05-13 MED ORDER — LORAZEPAM 1 MG PO TABS
0.5000 mg | ORAL_TABLET | Freq: Once | ORAL | Status: AC | PRN
  Administered 2024-05-13: 0.5 mg via ORAL
  Filled 2024-05-13: qty 1

## 2024-05-13 NOTE — ED Provider Notes (Signed)
 [place] EMERGENCY DEPARTMENT AT [facility] Provider Note   CSN: [id number] Arrival date & time: 05/13/24  1234     History  Chief Complaint  Patient presents with   Hypertension    Grace Gallagher is a 53 y.o. female here for evaluation of hypertension.  Takes multiple medications for this, states she has been compliant.  She noted on Thursday she had pain in the back of her head.  Denies any recent injury or trauma.  Earlier today she had some tingling to her right hand which lasted about 10 minutes and completely resolved.  No facial droop, slurred speech, weakness.  No diplopia.  Was seen outpatient had blood pressure medication increased on Thursday.  She feels this has not helped.  She has noted some blurred vision over the last few weeks.  No current headache, numbness or weakness.  No chest pain or shortness of breath.  No sudden onset thunderclap headache.  She checked blood pressure which was 219 systolic and became concerned leading her here to the ED  HPI     Home Medications Prior to Admission medications   Medication Sig Start Date End Date Taking? Authorizing Provider  albuterol  (VENTOLIN  HFA) 108 (90 Base) MCG/ACT inhaler Inhale 1-2 puffs into the lungs every 6 (six) hours as needed for wheezing or shortness of breath.    [provider]  Ascorbic Acid (VITAMIN C) 1000 MG tablet Take 1,000 mg by mouth 3 (three) times a week.    [provider]  BAYER ASPIRIN 325 MG tablet Take 325 mg by mouth daily.    [provider]  carvedilol  (COREG ) 25 MG tablet Take 1 tablet (25 mg total) by mouth 2 (two) times daily. 01/03/20 09/07/23  Walker, Caitlin S, NP  Cholecalciferol (VITAMIN D3) 1000 units CAPS Take 1,000 Units by mouth daily.    [provider]  diclofenac Sodium (VOLTAREN) 1 % GEL Apply 2 g topically 4 (four) times daily as needed (for pain- affected sites).    [provider]  diltiazem (CARDIZEM CD) 360 MG 24 hr  capsule Take 360 mg by mouth daily.    [provider]  DULoxetine (CYMBALTA) 60 MG capsule Take 120 mg by mouth in the morning.    [provider]  HUMULIN R  U-500 KWIKPEN 500 UNIT/ML KwikPen Inject 55 Units into the skin daily with breakfast.    [provider]  metFORMIN (GLUCOPHAGE-XR) 500 MG 24 hr tablet Take 1,000 mg by mouth in the morning and at bedtime.    [provider]  methylPREDNISolone  (MEDROL  DOSEPAK) 4 MG TBPK tablet 6 day dose pack - take as directed Patient not taking: Reported on 09/07/2023 02/09/22   Hyatt, Max T, DPM  metroNIDAZOLE  (FLAGYL ) 500 MG tablet Take 1 tablet (500 mg total) by mouth 2 (two) times daily. Patient not taking: Reported on 09/07/2023 08/08/23   Ninetta Basket, MD  ondansetron  (ZOFRAN -ODT) 4 MG disintegrating tablet Take 1 tablet (4 mg total) by mouth every 8 (eight) hours as needed for nausea or vomiting. Patient not taking: Reported on 09/07/2023 04/01/22   Scarlette Currier, MD  traMADol  (ULTRAM ) 50 MG tablet Take 50 mg by mouth every 6 (six) hours as needed (for pain).    [provider]      Allergies    Hydrocodone , Egg-derived products, Haemophilus b polysaccharide vaccine, Influenza virus vaccine, and Metformin and related    Review of Systems   Review of Systems  Constitutional: Negative.  HENT: Negative.    Eyes:  Positive for visual disturbance (BIL blurred vision x weeks).  Respiratory: Negative.    Cardiovascular: Negative.   Gastrointestinal: Negative.   Genitourinary: Negative.   Musculoskeletal: Negative.   Skin: Negative.   Neurological:  Positive for headaches (resolved).  All other systems reviewed and are negative.   Physical Exam Updated Vital Signs BP (!) 178/108   Pulse 88   Temp 98.7 F (37.1 C) (Oral)   Resp 18   Ht 5\' 6"  (1.676 m)   Wt 108.9 kg   LMP 04/03/2018   SpO2 99%   BMI 38.74 kg/m  Physical Exam Physical Exam  Constitutional: Pt is oriented to person,  place, and time. Pt appears well-developed and well-nourished. No distress.  HENT:  Head: Normocephalic and atraumatic.  Mouth/Throat: Oropharynx is clear and moist.  Eyes: Conjunctivae and EOM are normal. Pupils are equal, round, and reactive to light. No scleral icterus.  No horizontal, vertical or rotational nystagmus  Neck: Normal range of motion. Neck supple.  Full active and passive ROM without pain No midline or paraspinal tenderness No nuchal rigidity or meningeal signs  Cardiovascular: Normal rate, regular rhythm and intact distal pulses.   Pulmonary/Chest: Effort normal and breath sounds normal. No respiratory distress. Pt has no wheezes. No rales.  Abdominal: Soft. Bowel sounds are normal. There is no tenderness. There is no rebound and no guarding.  Musculoskeletal: Normal range of motion.  Lymphadenopathy:    No cervical adenopathy.  Neurological: Pt. is alert and oriented to person, place, and time. He has normal reflexes. No cranial nerve deficit.  Exhibits normal muscle tone. Coordination normal.  Mental Status:  Alert, oriented, thought content appropriate. Speech fluent without evidence of aphasia. Able to follow 2 step commands without difficulty.  Cranial Nerves:  2-12 grossly intact Motor:  Equal strength intact Sensory: intact sensation Cerebellar: normal F2N Gait: normal gait and balance CV: distal pulses palpable throughout   Skin: Skin is warm and dry. No rash noted. Pt is not diaphoretic.  Psychiatric: Pt has a normal mood and affect. Behavior is normal. Judgment and thought content normal.  Nursing note and vitals reviewed.  ED Results / Procedures / Treatments   Labs (all labs ordered are listed, but only abnormal results are displayed) Labs Reviewed  BASIC METABOLIC PANEL WITH GFR - Abnormal; Notable for the following components:      Result Value   Glucose, Bld 201 (*)    Calcium  8.7 (*)    All other components within normal limits  CBC - Abnormal;  Notable for the following components:   Hemoglobin 11.7 (*)    MCV 75.2 (*)    MCH 23.0 (*)    All other components within normal limits  I-STAT CHEM 8, ED - Abnormal; Notable for the following components:   Glucose, Bld 206 (*)    All other components within normal limits  TROPONIN I (HIGH SENSITIVITY) - Abnormal; Notable for the following components:   Troponin I (High Sensitivity) 20 (*)    All other components within normal limits  TROPONIN I (HIGH SENSITIVITY)    EKG None  Radiology DG Chest 2 View Result Date: 05/13/2024 CLINICAL DATA:  Hypertension. EXAM: CHEST - 2 VIEW COMPARISON:  December 28, 2023 FINDINGS: The heart size and mediastinal contours are within normal limits. Low lung volumes are noted. Mild, stable linear scarring is seen within the mid left lung and right lung base. There is no evidence of focal consolidation, pleural  effusion or pneumothorax. The visualized skeletal structures are unremarkable. IMPRESSION: Low lung volumes with mild, stable linear scarring within the mid left lung and right lung base. Electronically Signed   By: Virgle Grime M.D.   On: 05/13/2024 14:11   CLINICAL DATA: Initial evaluation for acute diplopia, TIA.  EXAM: CT ANGIOGRAPHY HEAD AND NECK WITH AND WITHOUT CONTRAST  TECHNIQUE: Multidetector CT imaging of the head and neck was performed using the standard protocol during bolus administration of intravenous contrast. Multiplanar CT image reconstructions and MIPs were obtained to evaluate the vascular anatomy. Carotid stenosis measurements (when applicable) are obtained utilizing NASCET criteria, using the distal internal carotid diameter as the denominator.  RADIATION DOSE REDUCTION: This exam was performed according to the departmental dose-optimization program which includes automated exposure control, adjustment of the mA and/or kV according to patient size and/or use of iterative reconstruction technique.  CONTRAST: 75mL  OMNIPAQUE  IOHEXOL  350 MG/ML SOLN  COMPARISON: Prior study from 06/03/2014.  FINDINGS: CT HEAD FINDINGS  Brain: No acute intracranial hemorrhage. No acute large vessel territory infarct. No mass lesion, midline shift or mass effect. No hydrocephalus or extra-axial fluid collection. Empty sella noted.  Vascular: No abnormal hyperdense vessel.  Skull: Scalp soft tissues demonstrate no acute finding. Calvarium intact.  Sinuses/Orbits: Globes orbital soft tissues within normal limits. Paranasal sinuses are largely clear. Small right mastoid effusion noted.  Other: None.  Review of the MIP images confirms the above findings  CTA NECK FINDINGS  Aortic arch: Visualized arch within normal limits for caliber. Bovine branching pattern noted. No stenosis about the origin of the great vessels.  Right carotid system: No evidence of dissection, stenosis (50% or greater), or occlusion.  Left carotid system: No evidence of dissection, stenosis (50% or greater), or occlusion.  Vertebral arteries: Vertebral arteries are patent without stenosis or dissection.  Skeleton: No worrisome osseous lesions.  Other neck: No acute finding.  Upper chest: No other acute finding.  Review of the MIP images confirms the above findings  CTA HEAD FINDINGS  Anterior circulation: Both internal carotid arteries are patent to the termini without stenosis. 5 x 6 x 5 mm aneurysm arising from the supraclinoid right ICA, consistent with a right PCOM aneurysm (series 10, image 114). A1 segments, anterior communicating artery complex, and anterior cerebral arteries widely patent. No M1 stenosis or occlusion. Distal MCA branches perfused and symmetric.  Posterior circulation: Both vertebral arteries patent without stenosis. Left vertebral artery dominant. Both PICA grossly patent at their origins. Basilar patent without stenosis. Superior cerebellar and posterior cerebral arteries patent  bilaterally.  Venous sinuses: Patent allowing for timing the contrast bolus.  Anatomic variants: As above.  Review of the MIP images confirms the above findings  IMPRESSION: 1. Negative CTA for large vessel occlusion or other emergent finding. 2. 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 3. No other acute intracranial abnormality. 4. Empty sella. While this finding is often incidental in nature and of no clinical significance, this can also be seen in the setting of idiopathic intracranial hypertension.   Electronically Signed By: Virgia Griffins M.D. On: 05/13/2024 19:49   CLINICAL DATA: Initial evaluation for acute headache, neuro deficit.  EXAM: MRI HEAD WITHOUT CONTRAST  TECHNIQUE: Multiplanar, multiecho pulse sequences of the brain and surrounding structures were obtained without intravenous contrast.  COMPARISON: CT from earlier the same day.  FINDINGS: Brain: Cerebral volume within normal limits for age. Patchy T2/FLAIR signal abnormality involving the supratentorial cerebral white matter, nonspecific, but most commonly  related to chronic microvascular ischemic disease. Changes are mild in nature.  No abnormal foci of restricted diffusion to suggest acute or subacute ischemia. Gray-white matter differentiation well maintained. No encephalomalacia to suggest chronic cortical infarction or other insult. No foci of susceptibility artifact indicative of acute or chronic intracranial blood products.  No mass lesion, midline shift or mass effect. Ventricles normal in size and morphology without hydrocephalus. No extra-axial fluid collection.  Empty sella noted.  Vascular: Major intracranial vascular flow voids are well maintained.  Skull and upper cervical spine: Craniocervical junction within normal limits. Visualized upper cervical spine demonstrates no significant finding. Bone marrow signal intensity within normal limits. No scalp soft tissue  abnormality.  Sinuses/Orbits: Globes and orbital soft tissues are within normal limits.  Scattered mucosal thickening noted about the ethmoidal air cells and maxillary sinuses. Paranasal sinuses are otherwise largely clear. Small right mastoid effusion noted, of doubtful significance.  Other: None.  IMPRESSION: 1. No acute intracranial abnormality. 2. Mild cerebral white matter disease, nonspecific, but most commonly related to chronic microvascular ischemic disease. 3. Empty sella. While this finding is often incidental in nature and of no clinical significance, this can also be seen in the setting of idiopathic intracranial hypertension.   Electronically Signed By: Virgia Griffins M.D. On: 05/13/2024 22:09   Procedures Procedures    Medications Ordered in ED Medications  iohexol  (OMNIPAQUE ) 350 MG/ML injection 75 mL (75 mLs Intravenous Contrast Given 05/13/24 1548)  LORazepam (ATIVAN) tablet 0.5 mg (0.5 mg Oral Given 05/13/24 1906)  hydrALAZINE (APRESOLINE) tablet 25 mg (25 mg Oral Given 05/13/24 2222)    ED Course/ Medical Decision Making/ A&P   53 year old here for evaluation of elevated blood pressure.  Seems to be an ongoing issue for patient.  Followed with cardiology.  States she recently had some blood pressure medications added due to her persistently elevated blood sugars.  Has had some intermittent headaches.  She has had blurred vision for a few weeks.  No current headache.  Had some tingling to her right hand earlier today which lasted about 10 minutes and self resolved.  No weakness, visual field cut, slurred speech.  She has a nonfocal neuroexam without deficits currently.  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  No rupture, no large vessel  occlusion.  Does show empty sella. MRI brain without acute abnormality again shows is empty sella  Patient reassessed.  She is asymptomatic.  She was given her home dose of hydralazine.  Discussed aneurysm findings on CT imaging.  Will refer to neurosurgery.  I did discuss close follow-up with cardiology who manages her blood pressure medications.  Discussed diet changes, weight loss as well no tobacco use.  With regards to her empty sella on imaging she is currently asymptomatic.  Do not feel she needs LP for IIH.  She can follow-up with neurology and her PCP for this.  No evidence of hypertensive emergency at this time.  Will have her follow-up outpatient, return for any worsening symptoms.  The patient has been appropriately medically screened and/or stabilized in the ED. I have low suspicion for any other emergent medical condition which would require further screening, evaluation or treatment in the ED or require inpatient management.  Patient is hemodynamically stable and in no acute distress.  Patient able  to ambulate in department prior to ED.  Evaluation does not show acute pathology that would require ongoing or additional emergent interventions while in the emergency department or further inpatient treatment.  I have discussed the diagnosis with the patient and answered all questions.  Pain is been managed while in the emergency department and patient has no further complaints prior to discharge.  Patient is comfortable with plan discussed in room and is stable for discharge at this time.  I have discussed strict return precautions for returning to the emergency department.  Patient was encouraged to follow-up with PCP/specialist refer to at discharge.                                Medical Decision Making Amount and/or Complexity of Data Reviewed External Data Reviewed: labs, radiology, ECG and notes. Labs: ordered. Decision-making details documented in ED Course. Radiology: ordered and  independent interpretation performed. Decision-making details documented in ED Course. ECG/medicine tests: ordered and independent interpretation performed. Decision-making details documented in ED Course.  Risk OTC drugs. Prescription drug management. Decision regarding hospitalization. Diagnosis or treatment significantly limited by social determinants of health.          Final Clinical Impression(s) / ED Diagnoses Final diagnoses:  Hypertensive urgency  Aneurysm [facility])    Rx / DC Orders ED Discharge Orders     None         Shenique Childers A, PA-C 05/13/24 2338    Wynetta Heckle, MD 05/14/24 1907

## 2024-05-13 NOTE — ED Provider Triage Note (Signed)
 Emergency Medicine Provider Triage Evaluation Note  Gaye Scorza Schlageter , a 53 y.o. female  was evaluated in triage.  Pt complains of elevated BP, normally 154/100, has been 219/102 lately 161/102, Called PCP and sent to the ER. Also pain to back of head onset Thursday. BP meds increased on Thursday but hasn't helped. Had some right arm numbness earlier today around 11am, lasted 10 minutes and completely resolved. Chest pain, SHOB. Reports headache, feeling faint.  No changes in speech, gait Vision blurry for a few weeks.  Review of Systems  Positive:  Negative:   Physical Exam  BP (!) 175/114 (BP Location: Right Arm)   Pulse 85   Temp 98.5 F (36.9 C) (Oral)   Resp 14   LMP 04/03/2018   SpO2 96%  Gen:   Awake, no distress   Resp:  Normal effort  MSK:   Moves extremities without difficulty  Other:    Medical Decision Making  Medically screening exam initiated at 12:56 PM.  Appropriate orders placed.  Raiana L Mayabb was informed that the remainder of the evaluation will be completed by another provider, this initial triage assessment does not replace that evaluation, and the importance of remaining in the ED until their evaluation is complete.     Darlis Eisenmenger, PA-C 05/13/24 1259

## 2024-05-13 NOTE — Discharge Instructions (Addendum)
 It was a pleasure taking care of you here in the emergency department.  As discussed in the room the CT scan of your head did show an aneurysm.  Is important to get your blood pressure under control.  Call your cardiologist tomorrow and let them know you were seen here today to see if you need to have a medication added to your blood pressure regimen.  Also as discussed in room it is important to not smoke, proper diet and exercise.  I have referred you to neurosurgery as well for this finding.  If you develop severe, sudden onset thunderclap headache, numbness, weakness please return to the emergency department  Make sure to follow-up outpatient, return for new or worsening symptoms

## 2024-05-13 NOTE — ED Triage Notes (Signed)
 Pt to ED pov from home. Pt has had HTN, headache, dizziness and double vision for past 2 weeks. Pt saw PCP for same, increased BP meds approximately 4 days ago w/o improvement.

## 2024-05-13 NOTE — ED Notes (Signed)
 Pt to MRI

## 2024-05-13 NOTE — ED Notes (Signed)
 Pt back from MRI

## 2024-05-22 ENCOUNTER — Other Ambulatory Visit: Payer: Self-pay | Admitting: Neurosurgery

## 2024-05-22 DIAGNOSIS — I671 Cerebral aneurysm, nonruptured: Secondary | ICD-10-CM

## 2024-05-31 ENCOUNTER — Other Ambulatory Visit: Payer: Self-pay

## 2024-05-31 ENCOUNTER — Other Ambulatory Visit: Payer: Self-pay | Admitting: Neurosurgery

## 2024-05-31 ENCOUNTER — Ambulatory Visit (HOSPITAL_COMMUNITY)
Admission: RE | Admit: 2024-05-31 | Discharge: 2024-05-31 | Disposition: A | Discharge status: Home / Self Care | Source: Ambulatory Visit | Attending: Neurosurgery | Admitting: Neurosurgery

## 2024-05-31 DIAGNOSIS — I10 Essential (primary) hypertension: Secondary | ICD-10-CM | POA: Diagnosis not present

## 2024-05-31 DIAGNOSIS — I671 Cerebral aneurysm, nonruptured: Secondary | ICD-10-CM | POA: Insufficient documentation

## 2024-05-31 DIAGNOSIS — E119 Type 2 diabetes mellitus without complications: Secondary | ICD-10-CM | POA: Insufficient documentation

## 2024-05-31 DIAGNOSIS — Z7984 Long term (current) use of oral hypoglycemic drugs: Secondary | ICD-10-CM | POA: Insufficient documentation

## 2024-05-31 DIAGNOSIS — Z794 Long term (current) use of insulin: Secondary | ICD-10-CM | POA: Insufficient documentation

## 2024-05-31 HISTORY — PX: IR ANGIO VERTEBRAL SEL VERTEBRAL BILAT MOD SED: IMG5369

## 2024-05-31 HISTORY — PX: IR ANGIO INTRA EXTRACRAN SEL INTERNAL CAROTID BILAT MOD SED: IMG5363

## 2024-05-31 LAB — CBC WITH DIFFERENTIAL/PLATELET
Abs Immature Granulocytes: 0.01 10*3/uL (ref 0.00–0.07)
Basophils Absolute: 0 10*3/uL (ref 0.0–0.1)
Basophils Relative: 0 %
Eosinophils Absolute: 0.1 10*3/uL (ref 0.0–0.5)
Eosinophils Relative: 3 %
HCT: 37.4 % (ref 36.0–46.0)
Hemoglobin: 11.5 g/dL — ABNORMAL LOW (ref 12.0–15.0)
Immature Granulocytes: 0 %
Lymphocytes Relative: 34 %
Lymphs Abs: 1.8 10*3/uL (ref 0.7–4.0)
MCH: 23 pg — ABNORMAL LOW (ref 26.0–34.0)
MCHC: 30.7 g/dL (ref 30.0–36.0)
MCV: 74.9 fL — ABNORMAL LOW (ref 80.0–100.0)
Monocytes Absolute: 0.4 10*3/uL (ref 0.1–1.0)
Monocytes Relative: 8 %
Neutro Abs: 3 10*3/uL (ref 1.7–7.7)
Neutrophils Relative %: 55 %
Platelets: 178 10*3/uL (ref 150–400)
RBC: 4.99 MIL/uL (ref 3.87–5.11)
RDW: 14.3 % (ref 11.5–15.5)
WBC: 5.4 10*3/uL (ref 4.0–10.5)
nRBC: 0 % (ref 0.0–0.2)

## 2024-05-31 LAB — BASIC METABOLIC PANEL WITH GFR
Anion gap: 8 (ref 5–15)
BUN: 26 mg/dL — ABNORMAL HIGH (ref 6–20)
CO2: 22 mmol/L (ref 22–32)
Calcium: 9.6 mg/dL (ref 8.9–10.3)
Chloride: 106 mmol/L (ref 98–111)
Creatinine, Ser: 1.25 mg/dL — ABNORMAL HIGH (ref 0.44–1.00)
GFR, Estimated: 52 mL/min — ABNORMAL LOW (ref >60)
Glucose, Bld: 208 mg/dL — ABNORMAL HIGH (ref 70–99)
Potassium: 4.4 mmol/L (ref 3.5–5.1)
Sodium: 136 mmol/L (ref 135–145)

## 2024-05-31 LAB — APTT: aPTT: 34 s (ref 24–36)

## 2024-05-31 LAB — GLUCOSE, CAPILLARY: Glucose-Capillary: 220 mg/dL — ABNORMAL HIGH (ref 70–99)

## 2024-05-31 LAB — PROTIME-INR
INR: 1 (ref 0.8–1.2)
Prothrombin Time: 13.6 s (ref 11.4–15.2)

## 2024-05-31 MED ORDER — HYDROCODONE-ACETAMINOPHEN 5-325 MG PO TABS: 1.0000 | ORAL_TABLET | ORAL | Status: DC | PRN

## 2024-05-31 MED ORDER — FENTANYL CITRATE (PF) 100 MCG/2ML IJ SOLN
INTRAMUSCULAR | Status: AC
  Filled 2024-05-31: qty 2

## 2024-05-31 MED ORDER — HEPARIN SODIUM (PORCINE) 1000 UNIT/ML IJ SOLN
INTRAMUSCULAR | Status: AC | PRN
  Administered 2024-05-31: 2000 [IU] via INTRAVENOUS

## 2024-05-31 MED ORDER — CHLORHEXIDINE GLUCONATE CLOTH 2 % EX PADS: 6.0000 | MEDICATED_PAD | Freq: Once | CUTANEOUS | Status: DC

## 2024-05-31 MED ORDER — FENTANYL CITRATE (PF) 100 MCG/2ML IJ SOLN
INTRAMUSCULAR | Status: AC | PRN
  Administered 2024-05-31: 50 ug via INTRAVENOUS

## 2024-05-31 MED ORDER — LIDOCAINE HCL 1 % IJ SOLN
20.0000 mL | Freq: Once | INTRAMUSCULAR | Status: AC
  Administered 2024-05-31: 10 mL

## 2024-05-31 MED ORDER — MIDAZOLAM HCL 2 MG/2ML IJ SOLN
INTRAMUSCULAR | Status: AC | PRN
  Administered 2024-05-31: 1 mg via INTRAVENOUS

## 2024-05-31 MED ORDER — NITROGLYCERIN 1 MG/10 ML FOR IR/CATH LAB
INTRA_ARTERIAL | Status: AC
  Filled 2024-05-31: qty 10

## 2024-05-31 MED ORDER — HEPARIN SODIUM (PORCINE) 1000 UNIT/ML IJ SOLN
INTRAMUSCULAR | Status: AC
  Filled 2024-05-31: qty 10

## 2024-05-31 MED ORDER — LIDOCAINE HCL 1 % IJ SOLN
INTRAMUSCULAR | Status: AC
Start: 2024-05-31 — End: 2024-05-31
  Filled 2024-05-31: qty 20

## 2024-05-31 MED ORDER — CEFAZOLIN SODIUM-DEXTROSE 2-4 GM/100ML-% IV SOLN: 2.0000 g | INTRAVENOUS | Status: DC

## 2024-05-31 MED ORDER — IOHEXOL 300 MG/ML  SOLN
150.0000 mL | Freq: Once | INTRAMUSCULAR | Status: AC | PRN
  Administered 2024-05-31: 60 mL via INTRA_ARTERIAL

## 2024-05-31 MED ORDER — MIDAZOLAM HCL 2 MG/2ML IJ SOLN
INTRAMUSCULAR | Status: AC
Start: 2024-05-31 — End: 2024-05-31
  Filled 2024-05-31: qty 2

## 2024-05-31 MED ORDER — CEFAZOLIN SODIUM-DEXTROSE 2-4 GM/100ML-% IV SOLN
INTRAVENOUS | Status: AC
Start: 2024-05-31 — End: 2024-05-31
  Filled 2024-05-31: qty 100

## 2024-05-31 NOTE — H&P (Signed)
 Chief Complaint   Aneurysm  History of Present Illness  Grace Gallagher is a 53 year old woman seen for initial consultation after recent visit to the emergency department for hypertensive urgency. Patient has a history of difficult-to-control hypertension. She noted relatively severe headache associated with some dizziness and blurry vision as well as some numbness in her right hand. She therefore called her primary doctor who instructed her to go to the emergency department. 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 She was therefore referred for outpatient for surgical evaluation. She does continue to report some headache albeit significantly better than it was in the emergency department.  Of note, the patient has refractory hypertension. She also reports a history of diabetes. No previous heart attack or stroke. No known liver or kidney disease, she does have asthma. She is not on any blood thinners or antiplatelet agents. She is a nonsmoker. There is no known family history of intracranial aneurysms or explained intracranial hemorrhage.   Past Medical History   Past Medical History:  Diagnosis Date   Anemia    Diabetes mellitus without complication (HCC)    Hypertension    Obesity     Past Surgical History   Past Surgical History:  Procedure Laterality Date   ABDOMINAL HYSTERECTOMY     BREAST SURGERY  08/2011   breast reduction   CYSTOSCOPY  04/12/2018   Procedure: CYSTOSCOPY;  Surgeon: Arloa Lamar SQUIBB, MD;  Location: [facility] ORS;  Service: Gynecology;;   FOOT SURGERY Left    cyst removed   GALLBLADDER SURGERY  09/03/2018   LAPAROSCOPIC HYSTERECTOMY Bilateral 04/12/2018   Procedure: HYSTERECTOMY TOTAL LAPAROSCOPIC BILATERAL SALPINGECTOMY;  Surgeon: Arloa Lamar SQUIBB, MD;  Location: [facility] ORS;  Service: Gynecology;  Laterality: Bilateral;   TUBAL LIGATION       Social History   Social History   Tobacco Use   Smoking status: Never   Smokeless tobacco: Never  Vaping Use   Vaping status: Never Used  Substance Use Topics   Alcohol use: No   Drug use: No    Medications   Prior to Admission medications   Medication Sig Start Date End Date Taking? Authorizing Provider  BAYER ASPIRIN 325 MG tablet Take 325 mg by mouth daily.   Yes [provider]  carvedilol  (COREG ) 25 MG tablet Take 1 tablet (25 mg total) by mouth 2 (two) times daily. 01/03/20 05/31/24 Yes Vannie Reche RAMAN, NP  Cholecalciferol (VITAMIN D3) 1000 units CAPS Take 1,000 Units by mouth daily.   Yes [provider]  diltiazem (CARDIZEM CD) 360 MG 24 hr capsule Take 360 mg by mouth daily.   Yes [provider]  DULoxetine (CYMBALTA) 60 MG capsule Take 120 mg by mouth in the morning.   Yes [provider]  Fezolinetant (VEOZAH) 45 MG TABS Take by mouth.   Yes [provider]  HUMULIN R  U-500 KWIKPEN 500 UNIT/ML KwikPen Inject 50 Units into the skin daily with breakfast.   Yes [provider]  hydrALAZINE  (APRESOLINE ) 25 MG tablet Take 25 mg by mouth 3 (three) times daily. 2tabs tid   Yes [provider]  insulin  degludec (TRESIBA) 200 UNIT/ML FlexTouch Pen Inject into the skin.   Yes [provider]  isosorbide mononitrate (IMDUR) 30 MG 24 hr tablet Take 30 mg by mouth daily.   Yes [provider]  ketoconazole (NIZORAL) 2 % cream Apply  1 Application topically daily.   Yes [provider]  ondansetron  (ZOFRAN -ODT) 4 MG disintegrating tablet Take 1 tablet (4 mg total) by mouth every 8 (eight) hours as needed for nausea or vomiting. 04/01/22  Yes Dreama Longs, MD  pregabalin (LYRICA) 75 MG capsule Take 75 mg by mouth 3 (three) times daily.   Yes [provider]  spironolactone (ALDACTONE) 50 MG tablet Take 50 mg by mouth 2 (two) times daily.   Yes [provider]  telmisartan  (MICARDIS) 40 MG tablet Take 40 mg by mouth 2 (two) times daily.   Yes [provider]  traMADol  (ULTRAM ) 50 MG tablet Take 50 mg by mouth every 6 (six) hours as needed (for pain).   Yes [provider]  zolpidem (AMBIEN) 5 MG tablet Take 5 mg by mouth at bedtime as needed for sleep.   Yes [provider]  albuterol  (VENTOLIN  HFA) 108 (90 Base) MCG/ACT inhaler Inhale 1-2 puffs into the lungs every 6 (six) hours as needed for wheezing or shortness of breath.    [provider]  Ascorbic Acid (VITAMIN C) 1000 MG tablet Take 1,000 mg by mouth 3 (three) times a week.    [provider]  diclofenac Sodium (VOLTAREN) 1 % GEL Apply 2 g topically 4 (four) times daily as needed (for pain- affected sites).    [provider]  docusate sodium  (COLACE) 100 MG capsule Take 200 mg by mouth daily as needed for mild constipation.    [provider]  ipratropium (ATROVENT ) 0.03 % nasal spray Place 2 sprays into both nostrils 3 (three) times daily.    [provider]  metFORMIN (GLUCOPHAGE-XR) 500 MG 24 hr tablet Take 1,000 mg by mouth in the morning and at bedtime.    [provider]  methylPREDNISolone  (MEDROL  DOSEPAK) 4 MG TBPK tablet 6 day dose pack - take as directed Patient not taking: Reported on 09/07/2023 02/09/22   Hyatt, Max T, DPM  metroNIDAZOLE  (FLAGYL ) 500 MG tablet Take 1 tablet (500 mg total) by mouth 2 (two) times daily. Patient not taking: Reported on 09/07/2023 08/08/23   Yolande Lamar BROCKS, MD    Allergies   Allergies  Allergen Reactions   Hydrocodone  Nausea Only   Egg-Derived Products Rash   Haemophilus B Polysaccharide Vaccine Rash   Influenza Virus Vaccine Rash and Other (See Comments)    Develops a cold the day after receiving it- can tolerate the shot if it is received at a doctor's office (MUST be egg-free)   Metformin And Related Diarrhea    Review of Systems  ROS  Neurologic Exam  Awake, alert,  oriented Memory and concentration grossly intact Speech fluent, appropriate CN grossly intact Motor exam: Upper Extremities Deltoid Bicep Tricep Grip  Right 5/5 5/5 5/5 5/5  Left 5/5 5/5 5/5 5/5   Lower Extremities IP Quad PF DF EHL  Right 5/5 5/5 5/5 5/5 5/5  Left 5/5 5/5 5/5 5/5 5/5   Sensation grossly intact to LT  Imaging  CTA reveals an approximately 6mm right Pcom aneurysm  Impression  - 53 y.o. female with incidental discovery of ~59mm right Pcom aneurysm  Plan  - Will proceed with diagnostic cerebral angiogram  I have reviewed the indications for the procedure as well as the details of the procedure and the expected postoperative course and recovery at length with the patient in the office. We have also reviewed in detail the risks, benefits, and alternatives to the procedure. All questions were  answered and Fritzie L Scheib provided informed consent to proceed.  Gerldine Maizes, MD [facility]

## 2024-05-31 NOTE — Progress Notes (Signed)
Patient was given discharge instructions. Both verbalized understanding. 

## 2024-05-31 NOTE — Op Note (Signed)
 ENDOVASCULAR NEUROSURGERY OPERATIVE NOTE   PROCEDURE: Diagnostic Cerebral Angiogram   SURGEON:   Dr. Gerldine Maizes, MD  HISTORY:   The patient is a 53 y.o. yo female initially seen in the outpatient neurosurgery clinic after being referred from the emergency department where she presented with hypertensive urgency.  Her workup included CT angiogram which revealed an incidental 6 mm right posterior communicating artery aneurysm.  The patient therefore presents today for further workup with diagnostic cerebral angiogram.  APPROACH:   The technical aspects of the procedure as well as its potential risks and benefits were reviewed with the patient. These risks included but were not limited bleeding, infection, allergic reaction, damage to organs/vital structures, stroke, non-diagnostic procedure, and the catastrophic outcomes of heart attack, coma, and death. With an understanding of these risks, informed consent was obtained and witnessed.    The patient was placed in the supine position on the angiography table and the skin of right groin prepped in the usual sterile fashion. The procedure was performed under local anesthesia (1%-solution of bicarbonate-bufferred Lidoacaine) and conscious sedation with Versed  and fentanyl  monitored by the in-suite nurse and myself, including non-invasive blood pressure and continuous pulse oxymetry.    Access to the right common femoral artery was obtained utilizing a micropuncture needle and standard Seldinger technique.  A short 5 French sheath was placed.  HEPARIN :  2000 Units total.    CONTRAST AGENT:  See IR records   FLUOROSCOPY TIME:  See IR records    CATHETER(S) AND WIRE(S):    5-French JB-1 glidecatheter   0.035" glidewire    VESSELS CATHETERIZED:   Right internal carotid   Left internal carotid   Left vertebral   Right common femoral  VESSELS STUDIED:   Right internal carotid, head Left internal carotid, head Left  vertebral Right femoral  PROCEDURAL NARRATIVE:   A 5-Fr JB-1 terumo glide catheter was advanced over a 0.035 glidewire into the aortic arch. The above vessels were then sequentially catheterized and cervical/cerebral angiograms taken. After review of images, the catheter was removed without incident.    INTERPRETATION:   Right internal carotid, head:   Injection reveals the presence of a widely patent ICA, M1, and A1 segments and their branches.  There is a relatively wide neck posteriorly directed aneurysm of the posterior communicating artery with a small daughter sac projecting posteriorly.  Overall dimensions are approximately 6 x 6 mm.  The parenchymal and venous phases are unremarkable. The venous sinuses are widely patent.    Left internal carotid, head:   Injection reveals the presence of a widely patent ICA, A1, and M1 segments and their branches. No aneurysms, arteriovenous malformations, or high flow fistulas are visualized.  The parenchymal and venous phases are unremarkable. The venous sinuses are widely patent.    Left vertebral:   Injection reveals the presence of a widely patent vertebral artery. This leads to a widely patent basilar artery that terminates in bilateral P1. The basilar apex is normal. No aneurysms, arteriovenous malformations, or high flow fistulas are visualized. The parenchymal and venous phases are normal. The venous sinuses are patent.    Right femoral:    Normal vessel. No significant atherosclerotic disease. Arterial sheath in adequate position.   DISPOSITION:  Upon completion of the study, the femoral sheath was removed and hemostasis obtained using a 5-Fr Exoseal closure device. Good proximal and distal lower extremity pulses were documented upon achievement of hemostasis. The procedure was well tolerated and no early complications were observed.  The patient was transferred to the recovery area to be positioned flat in bed for 3 hours.    IMPRESSION:   1.  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   Gerldine Maizes, MD [facility]

## 2024-05-31 NOTE — Discharge Instructions (Signed)
 Femoral Site Care This sheet gives you information about how to care for yourself after your procedure. Your health care provider may also give you more specific instructions. If you have problems or questions, contact your health care provider. What can I expect after the procedure?  After the procedure, it is common to have: Bruising that usually fades within 1-2 weeks. Tenderness at the site. Follow these instructions at home: Wound care Follow instructions from your health care provider about how to take care of your insertion site. Make sure you: Wash your hands with soap and water before you change your bandage (dressing). If soap and water are not available, use hand sanitizer. Remove your dressing as told by your health care provider. 24 hours Do not take baths, swim, or use a hot tub until your health care provider approves. You may shower 24-48 hours after the procedure or as told by your health care provider. Gently wash the site with plain soap and water. Pat the area dry with a clean towel. Do not rub the site. This may cause bleeding. Do not apply powder or lotion to the site. Keep the site clean and dry. Check your femoral site every day for signs of infection. Check for: Redness, swelling, or pain. Fluid or blood. Warmth. Pus or a bad smell. Activity For the first 2-3 days after your procedure, or as long as directed: Avoid climbing stairs as much as possible. Do not squat. Do not lift anything that is heavier than 10 lb (4.5 kg), or the limit that you are told, until your health care provider says that it is safe. For 5 days Rest as directed. Avoid sitting for a long time without moving. Get up to take short walks every 1-2 hours. Do not drive for 24 hours if you were given a medicine to help you relax (sedative). General instructions Take over-the-counter and prescription medicines only as told by your health care provider. Keep all follow-up visits as told by your  health care provider. This is important. Contact a health care provider if you have: A fever or chills. You have redness, swelling, or pain around your insertion site. Get help right away if: The catheter insertion area swells very fast. You Lwin out. You suddenly start to sweat or your skin gets clammy. The catheter insertion area is bleeding, and the bleeding does not stop when you hold steady pressure on the area. The area near or just beyond the catheter insertion site becomes pale, cool, tingly, or numb. These symptoms may represent a serious problem that is an emergency. Do not wait to see if the symptoms will go away. Get medical help right away. Call your local emergency services (911 in the [place] Do not drive yourself to the hospital. Summary After the procedure, it is common to have bruising that usually fades within 1-2 weeks. Check your femoral site every day for signs of infection. Do not lift anything that is heavier than 10 lb (4.5 kg), or the limit that you are told, until your health care provider says that it is safe. This information is not intended to replace advice given to you by your health care provider. Make sure you discuss any questions you have with your health care provider. Document Revised: 12/04/2017 Document Reviewed: 12/04/2017 Elsevier Patient Education  2020 [employer].

## 2024-06-03 ENCOUNTER — Encounter (HOSPITAL_COMMUNITY): Payer: Self-pay

## 2024-06-13 ENCOUNTER — Emergency Department (HOSPITAL_COMMUNITY)

## 2024-06-13 ENCOUNTER — Observation Stay (HOSPITAL_COMMUNITY)
Admission: EM | Admit: 2024-06-13 | Discharge: 2024-06-14 | Disposition: A | Discharge status: Home / Self Care | Attending: Internal Medicine | Admitting: Internal Medicine

## 2024-06-13 ENCOUNTER — Other Ambulatory Visit: Payer: Self-pay

## 2024-06-13 ENCOUNTER — Other Ambulatory Visit (HOSPITAL_COMMUNITY): Payer: Self-pay | Admitting: Neurosurgery

## 2024-06-13 DIAGNOSIS — J45909 Unspecified asthma, uncomplicated: Secondary | ICD-10-CM | POA: Insufficient documentation

## 2024-06-13 DIAGNOSIS — I671 Cerebral aneurysm, nonruptured: Secondary | ICD-10-CM | POA: Diagnosis not present

## 2024-06-13 DIAGNOSIS — Z794 Long term (current) use of insulin: Secondary | ICD-10-CM | POA: Insufficient documentation

## 2024-06-13 DIAGNOSIS — E1122 Type 2 diabetes mellitus with diabetic chronic kidney disease: Secondary | ICD-10-CM | POA: Insufficient documentation

## 2024-06-13 DIAGNOSIS — I129 Hypertensive chronic kidney disease with stage 1 through stage 4 chronic kidney disease, or unspecified chronic kidney disease: Secondary | ICD-10-CM | POA: Diagnosis not present

## 2024-06-13 DIAGNOSIS — Z7982 Long term (current) use of aspirin: Secondary | ICD-10-CM | POA: Diagnosis not present

## 2024-06-13 DIAGNOSIS — N183 Chronic kidney disease, stage 3 unspecified: Secondary | ICD-10-CM | POA: Insufficient documentation

## 2024-06-13 DIAGNOSIS — R2990 Unspecified symptoms and signs involving the nervous system: Principal | ICD-10-CM

## 2024-06-13 DIAGNOSIS — D509 Iron deficiency anemia, unspecified: Secondary | ICD-10-CM | POA: Diagnosis present

## 2024-06-13 DIAGNOSIS — I10 Essential (primary) hypertension: Secondary | ICD-10-CM | POA: Diagnosis present

## 2024-06-13 DIAGNOSIS — R519 Headache, unspecified: Secondary | ICD-10-CM | POA: Diagnosis present

## 2024-06-13 DIAGNOSIS — F419 Anxiety disorder, unspecified: Secondary | ICD-10-CM | POA: Diagnosis not present

## 2024-06-13 DIAGNOSIS — I160 Hypertensive urgency: Secondary | ICD-10-CM | POA: Diagnosis not present

## 2024-06-13 DIAGNOSIS — R4701 Aphasia: Secondary | ICD-10-CM

## 2024-06-13 DIAGNOSIS — G4733 Obstructive sleep apnea (adult) (pediatric): Secondary | ICD-10-CM | POA: Diagnosis present

## 2024-06-13 DIAGNOSIS — E1169 Type 2 diabetes mellitus with other specified complication: Secondary | ICD-10-CM | POA: Diagnosis present

## 2024-06-13 DIAGNOSIS — E119 Type 2 diabetes mellitus without complications: Secondary | ICD-10-CM

## 2024-06-13 DIAGNOSIS — D649 Anemia, unspecified: Secondary | ICD-10-CM | POA: Diagnosis not present

## 2024-06-13 DIAGNOSIS — N1831 Chronic kidney disease, stage 3a: Secondary | ICD-10-CM | POA: Insufficient documentation

## 2024-06-13 DIAGNOSIS — E782 Mixed hyperlipidemia: Secondary | ICD-10-CM | POA: Diagnosis present

## 2024-06-13 DIAGNOSIS — I639 Cerebral infarction, unspecified: Secondary | ICD-10-CM | POA: Diagnosis present

## 2024-06-13 LAB — COMPREHENSIVE METABOLIC PANEL WITH GFR
ALT: 21 U/L (ref 0–44)
AST: 23 U/L (ref 15–41)
Albumin: 3.8 g/dL (ref 3.5–5.0)
Alkaline Phosphatase: 78 U/L (ref 38–126)
Anion gap: 10 (ref 5–15)
BUN: 18 mg/dL (ref 6–20)
CO2: 24 mmol/L (ref 22–32)
Calcium: 9.6 mg/dL (ref 8.9–10.3)
Chloride: 106 mmol/L (ref 98–111)
Creatinine, Ser: 1.08 mg/dL — ABNORMAL HIGH (ref 0.44–1.00)
GFR, Estimated: >60 mL/min (ref >60)
Glucose, Bld: 92 mg/dL (ref 70–99)
Potassium: 3.4 mmol/L — ABNORMAL LOW (ref 3.5–5.1)
Sodium: 140 mmol/L (ref 135–145)
Total Bilirubin: 1 mg/dL (ref 0.0–1.2)
Total Protein: 6.8 g/dL (ref 6.5–8.1)

## 2024-06-13 LAB — CBC
HCT: 33.8 % — ABNORMAL LOW (ref 36.0–46.0)
Hemoglobin: 10.3 g/dL — ABNORMAL LOW (ref 12.0–15.0)
MCH: 23 pg — ABNORMAL LOW (ref 26.0–34.0)
MCHC: 30.5 g/dL (ref 30.0–36.0)
MCV: 75.6 fL — ABNORMAL LOW (ref 80.0–100.0)
Platelets: 211 K/uL (ref 150–400)
RBC: 4.47 MIL/uL (ref 3.87–5.11)
RDW: 14.7 % (ref 11.5–15.5)
WBC: 6.4 K/uL (ref 4.0–10.5)
nRBC: 0.3 % — ABNORMAL HIGH (ref 0.0–0.2)

## 2024-06-13 LAB — DIFFERENTIAL
Abs Immature Granulocytes: 0.03 K/uL (ref 0.00–0.07)
Basophils Absolute: 0 K/uL (ref 0.0–0.1)
Basophils Relative: 1 %
Eosinophils Absolute: 0.2 K/uL (ref 0.0–0.5)
Eosinophils Relative: 3 %
Immature Granulocytes: 1 %
Lymphocytes Relative: 37 %
Lymphs Abs: 2.4 K/uL (ref 0.7–4.0)
Monocytes Absolute: 0.4 K/uL (ref 0.1–1.0)
Monocytes Relative: 7 %
Neutro Abs: 3.3 K/uL (ref 1.7–7.7)
Neutrophils Relative %: 51 %

## 2024-06-13 LAB — I-STAT CHEM 8, ED
BUN: 20 mg/dL (ref 6–20)
Calcium, Ion: 1.11 mmol/L — ABNORMAL LOW (ref 1.15–1.40)
Chloride: 107 mmol/L (ref 98–111)
Creatinine, Ser: 1.2 mg/dL — ABNORMAL HIGH (ref 0.44–1.00)
Glucose, Bld: 89 mg/dL (ref 70–99)
HCT: 34 % — ABNORMAL LOW (ref 36.0–46.0)
Hemoglobin: 11.6 g/dL — ABNORMAL LOW (ref 12.0–15.0)
Potassium: 3.5 mmol/L (ref 3.5–5.1)
Sodium: 142 mmol/L (ref 135–145)
TCO2: 25 mmol/L (ref 22–32)

## 2024-06-13 LAB — PROTIME-INR
INR: 0.9 (ref 0.8–1.2)
Prothrombin Time: 13.2 s (ref 11.4–15.2)

## 2024-06-13 LAB — ETHANOL: Alcohol, Ethyl (B): <15 mg/dL (ref <15)

## 2024-06-13 LAB — APTT: aPTT: 30 s (ref 24–36)

## 2024-06-13 NOTE — ED Triage Notes (Signed)
 EMS stated that when they was pulling into the bay they noticed twitching to her face bilateral sides, slurred speech and patient stated it was harder for her to talk.

## 2024-06-13 NOTE — ED Notes (Signed)
CBG 97 

## 2024-06-13 NOTE — ED Provider Notes (Signed)
 [place] [facility] AT [facility] Provider Note   CSN: [id number] Arrival date & time: 06/13/24  2235     Patient presents with: Code Stroke   Grace Gallagher is a 53 y.o. female.   HPI   53 year old female with self-reported history of brain aneurysm, currently on Brilinta presents emergency department with right-sided headache.  Patient states about an hour prior to arrival she had a sudden onset 10/10 right-sided headache with some neck pain.  States that this headache is similar to when she was diagnosed with a brain aneurysm about a month ago.  Patient was activated as a code stroke just prior to arrival after an episode of aphasia.  Patient evaluated EMS bridge with stroke neurology team at bedside.  Level 5 caveat due to acuity.  Prior to Admission medications   Medication Sig Start Date End Date Taking? Authorizing Provider  albuterol  (VENTOLIN  HFA) 108 (90 Base) MCG/ACT inhaler Inhale 1-2 puffs into the lungs every 6 (six) hours as needed for wheezing or shortness of breath.    [provider]  Ascorbic Acid (VITAMIN C) 1000 MG tablet Take 1,000 mg by mouth 3 (three) times a week.    [provider]  BAYER ASPIRIN 325 MG tablet Take 325 mg by mouth daily.    [provider]  carvedilol  (COREG ) 25 MG tablet Take 1 tablet (25 mg total) by mouth 2 (two) times daily. 01/03/20 05/31/24  Vannie Reche RAMAN, NP  Cholecalciferol (VITAMIN D3) 1000 units CAPS Take 1,000 Units by mouth daily.    [provider]  diclofenac Sodium (VOLTAREN) 1 % GEL Apply 2 g topically 4 (four) times daily as needed (for pain- affected sites).    [provider]  diltiazem (CARDIZEM CD) 360 MG 24 hr capsule Take 360 mg by mouth daily.    [provider]  docusate sodium  (COLACE) 100 MG capsule Take 200 mg by mouth daily as needed for mild constipation.    [provider]  DULoxetine (CYMBALTA) 60 MG capsule Take 120 mg by mouth  in the morning.    [provider]  Fezolinetant (VEOZAH) 45 MG TABS Take by mouth.    [provider]  HUMULIN R  U-500 KWIKPEN 500 UNIT/ML KwikPen Inject 50 Units into the skin daily with breakfast.    [provider]  hydrALAZINE  (APRESOLINE ) 25 MG tablet Take 25 mg by mouth 3 (three) times daily. 2tabs tid    [provider]  insulin  degludec (TRESIBA) 200 UNIT/ML FlexTouch Pen Inject into the skin.    [provider]  ipratropium (ATROVENT ) 0.03 % nasal spray Place 2 sprays into both nostrils 3 (three) times daily.    [provider]  isosorbide mononitrate (IMDUR) 30 MG 24 hr tablet Take 30 mg by mouth daily.    [provider]  ketoconazole (NIZORAL) 2 % cream Apply 1 Application topically daily.    [provider]  metFORMIN (GLUCOPHAGE-XR) 500 MG 24 hr tablet Take 1,000 mg by mouth in the morning and at bedtime.    [provider]  methylPREDNISolone  (MEDROL  DOSEPAK) 4 MG TBPK tablet 6 day dose pack - take as directed Patient not taking: Reported on 09/07/2023 02/09/22   Hyatt, Max T, DPM  metroNIDAZOLE  (FLAGYL ) 500 MG tablet Take 1 tablet (500 mg total) by mouth 2 (two) times daily. Patient not taking: Reported on 09/07/2023 08/08/23   Yolande Lamar BROCKS, MD  ondansetron  (ZOFRAN -ODT) 4 MG disintegrating tablet Take 1  tablet (4 mg total) by mouth every 8 (eight) hours as needed for nausea or vomiting. 04/01/22   Dreama Longs, MD  pregabalin (LYRICA) 75 MG capsule Take 75 mg by mouth 3 (three) times daily.    [provider]  spironolactone (ALDACTONE) 50 MG tablet Take 50 mg by mouth 2 (two) times daily.    [provider]  telmisartan (MICARDIS) 40 MG tablet Take 40 mg by mouth 2 (two) times daily.    [provider]  traMADol  (ULTRAM ) 50 MG tablet Take 50 mg by mouth every 6 (six) hours as needed (for pain).    [provider]  zolpidem (AMBIEN) 5 MG tablet Take 5 mg by mouth  at bedtime as needed for sleep.    [provider]    Allergies: Hydrocodone , Egg-derived products, Haemophilus b polysaccharide vaccine, Influenza virus vaccine, and Metformin and related    Review of Systems  Unable to perform ROS: Acuity of condition    Updated Vital Signs BP (!) 147/99 (BP Location: Left Arm)   Pulse 82   Temp 98 F (36.7 C) (Temporal)   Resp 19   LMP 04/03/2018   SpO2 100%   Physical Exam Vitals and nursing note reviewed.  Constitutional:      Appearance: Normal appearance.  HENT:     Head: Normocephalic.     Mouth/Throat:     Mouth: Mucous membranes are moist.  Cardiovascular:     Rate and Rhythm: Normal rate.  Pulmonary:     Effort: Pulmonary effort is normal. No respiratory distress.  Abdominal:     Palpations: Abdomen is soft.     Tenderness: There is no abdominal tenderness.  Skin:    General: Skin is warm.  Neurological:     Mental Status: She is alert and oriented to person, place, and time.     Comments: Weakness in the bilateral legs, initially had slow/slurred speech which became clear  Psychiatric:        Mood and Affect: Mood normal.     (all labs ordered are listed, but only abnormal results are displayed) Labs Reviewed  CBC - Abnormal; Notable for the following components:      Result Value   Hemoglobin 10.3 (*)    HCT 33.8 (*)    MCV 75.6 (*)    MCH 23.0 (*)    nRBC 0.3 (*)    All other components within normal limits  I-STAT CHEM 8, ED - Abnormal; Notable for the following components:   Creatinine, Ser 1.20 (*)    Calcium , Ion 1.11 (*)    Hemoglobin 11.6 (*)    HCT 34.0 (*)    All other components within normal limits  PROTIME-INR  APTT  DIFFERENTIAL  ETHANOL  COMPREHENSIVE METABOLIC PANEL WITH GFR  RAPID URINE DRUG SCREEN, HOSP PERFORMED    EKG: None  Radiology: CT HEAD CODE STROKE WO CONTRAST Result Date: 06/13/2024 CLINICAL DATA:  Code stroke. Initial evaluation for acute neuro deficit. EXAM: CT  HEAD WITHOUT CONTRAST TECHNIQUE: Contiguous axial images were obtained from the base of the skull through the vertex without intravenous contrast. RADIATION DOSE REDUCTION: This exam was performed according to the departmental dose-optimization program which includes automated exposure control, adjustment of the mA and/or kV according to patient size and/or use of iterative reconstruction technique. COMPARISON:  None Available. FINDINGS: Brain: Cerebral volume within normal limits. No acute intracranial hemorrhage. No acute large vessel territory infarct. No mass lesion or midline shift. No hydrocephalus or  extra-axial fluid collection. Empty sella noted. Vascular: No abnormal hyperdense vessel. Skull: Scalp soft tissues within normal limits.  Calvarium intact. Sinuses/Orbits: Globes orbital soft tissues within normal limits. Scattered mucosal thickening present about the ethmoidal air cells and maxillary sinuses. Small right mastoid effusion noted. Other: None. ASPECTS [facility] Stroke Program Early CT Score) - Ganglionic level infarction (caudate, lentiform nuclei, internal capsule, insula, M1-M3 cortex): 7 - Supraganglionic infarction (M4-M6 cortex): 3 Total score (0-10 with 10 being normal): 10 IMPRESSION: 1. No acute intracranial abnormality. 2. ASPECTS is 10. 3. Empty sella. These results were communicated to Dr. Lindzen at 10:53 pm on 06/13/2024 by text page via the [facility] messaging system. Electronically Signed   By: Morene Hoard M.D.   On: 06/13/2024 22:54     .Critical Care  Performed by: Bari Roxie HERO, DO Authorized by: Bari Roxie HERO, DO   Critical care provider statement:    Critical care time (minutes):  30   Critical care time was exclusive of:  Separately billable procedures and treating other patients   Critical care was necessary to treat or prevent imminent or life-threatening deterioration of the following conditions:  CNS failure or compromise   Critical care was time  spent personally by me on the following activities:  Development of treatment plan with patient or surrogate, discussions with consultants, evaluation of patient's response to treatment, examination of patient, ordering and review of laboratory studies, ordering and review of radiographic studies, ordering and performing treatments and interventions, pulse oximetry, re-evaluation of patient's condition and review of old charts   I assumed direction of critical care for this patient from another provider in my specialty: no      Medications Ordered in the ED - No data to display                                  Medical Decision Making Amount and/or Complexity of Data Reviewed Labs: ordered. Radiology: ordered.   53 year old female presents emergency department sudden onset right-sided headache as well as associated neck pain.  Self-reports a history of a brain aneurysm.  Activated as a code stroke after an episode of aphasia which appears to be improving on exam.  Patient seen with the stroke neurology team at bedside.  Fingerstick appropriate.  Patient went emergently to the CT scanner.  Head CT without shows no acute bleed.  Patient signed out pending full neurology evaluation and labs/disposition.     Final diagnoses:  None    ED Discharge Orders     None          Bari Roxie HERO, DO 06/13/24 2314

## 2024-06-13 NOTE — Code Documentation (Signed)
 Stroke Response Nurse Documentation Code Documentation  Grace Gallagher is a 53 y.o. female arriving to [facility]  via [facility] on 7/10 with past medical hx of HTN, obesity, aneurysm. On No antithrombotic. Code stroke was activated by EMS.   Patient from home where she was LKW at 2100 and now complaining of aphasia .  Stroke team at the bedside on patient arrival. Labs drawn and patient cleared for CT by Dr. Bari. Patient to CT with team. NIHSS 0, see documentation for details and code stroke times. Patient with no focal deficits on exam. The following imaging was completed:  CT Head and CTA. Patient is not a candidate for IV Thrombolytic due to resolved symptoms. Patient is not a candidate for IR due to no LVO.   Care Plan: neuro checks q2 hrs.    Bedside handoff with ED RN Grace Gallagher.    Grace Gallagher ORN  Rapid Response RN

## 2024-06-13 NOTE — ED Triage Notes (Signed)
 Patient brought in by EMS from home with c/o headache that started around 6pm. Denies any other pain. Patient has a history of right sided brain aneurysm. EMS stated patient BP was 214/122

## 2024-06-13 NOTE — Consult Note (Addendum)
 NEUROLOGY CONSULT NOTE   Date of service: June 13, 2024 Patient Name: Grace Gallagher MRN:  [id number] DOB:  1971-03-18 Chief Complaint: Severe headache and speech deficit Requesting Provider: Bari Roxie HERO, DO  History of Present Illness  Grace Gallagher is a 53 y.o. 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 LKN 2100, at which time she had sudden onset of a severe headache. EMS stated that when they was pulling into the bay they noticed twitching to her face, bilateral sides, slurred speech and patient stated it was harder for her to talk. Code Stroke was called by EMS. She is able to answer questions in full sentences on arrival to the ED, but appears anxious with tremor manifesting when she holds her hands outstretched in front of her during exam. She describes the headache as 10/10 in intensity, being present in the posterior right occipitoparietal region. No head trauma. BP per EMS was 214/122. CBG 97 on arrival. She is on daily ASA at home.   HPI from prior Neurosurgery H and P (05/31/24) has been reviewed: Grace Gallagher is a 53 year old woman seen for initial consultation after recent visit to the emergency department for hypertensive urgency. Patient has a history of difficult-to-control hypertension. She noted relatively severe headache associated with some dizziness and blurry vision as well as some numbness in her right hand. She therefore called her primary doctor who instructed her to go to the emergency department. 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 She was therefore referred for outpatient for surgical  evaluation. She does continue to report some headache albeit significantly better than it was in the emergency department. Of note, the patient has refractory hypertension. She also reports a history of diabetes. No previous heart attack or stroke. No known liver or kidney disease, she does have asthma. She is not on any blood thinners or antiplatelet agents. She is a nonsmoker. There is no known family history of intracranial aneurysms or explained intracranial hemorrhage.  LKW: 2100 Modified rankin score: 0-Completely asymptomatic and back to baseline post- stroke IV Thrombolysis/EVT:  No: NIHSS 0   NIHSS components Score: Comment  1a Level of Conscious 0[x]  1[]  2[]  3[]      1b LOC Questions 0[x]  1[]  2[]       1c LOC Commands 0[x]  1[]  2[]       2 Best Gaze 0[x]  1[]  2[]       3 Visual 0[x]  1[]  2[]  3[]      4 Facial Palsy 0[x]  1[]  2[]  3[]      5a Motor Arm - left 0[x]  1[]  2[]  3[]  4[]  UN[]   BUE tremor when arms held antigravity  5b Motor Arm - Right 0[x]  1[]  2[]  3[]  4[]  UN[]    6a Motor Leg - Left 0[x]  1[]  2[]  3[]  4[]  UN[]    6b Motor Leg - Right 0[x]  1[]  2[]  3[]  4[]  UN[]    7 Limb Ataxia 0[x]  1[]  2[]  UN[]      8 Sensory 0[x]  1[]  2[]  UN[]      9 Best Language 0[x]  1[]  2[]  3[]      10 Dysarthria 0[x]  1[]  2[]  UN[]      11 Extinct. and Inattention 0[x]  1[]  2[]   TOTAL:   0      ROS  As per HPI. Comprehensive ROS deferred in the context of acuity of presentation.   Past History   Past Medical History:  Diagnosis Date   Anemia    Diabetes mellitus without complication (HCC)    Hypertension    Obesity    Past Surgical History:  Procedure Laterality Date   ABDOMINAL HYSTERECTOMY     BREAST SURGERY  08/2011   breast reduction   CYSTOSCOPY  04/12/2018   Procedure: CYSTOSCOPY;  Surgeon: Arloa Lamar SQUIBB, MD;  Location: [facility] ORS;  Service: Gynecology;;   FOOT SURGERY Left    cyst removed   GALLBLADDER SURGERY  09/03/2018   IR ANGIO INTRA EXTRACRAN SEL INTERNAL CAROTID BILAT MOD SED   05/31/2024   IR ANGIO VERTEBRAL SEL VERTEBRAL BILAT MOD SED  05/31/2024   LAPAROSCOPIC HYSTERECTOMY Bilateral 04/12/2018   Procedure: HYSTERECTOMY TOTAL LAPAROSCOPIC BILATERAL SALPINGECTOMY;  Surgeon: Arloa Lamar SQUIBB, MD;  Location: [facility] ORS;  Service: Gynecology;  Laterality: Bilateral;   TUBAL LIGATION      Family History: Family History  Problem Relation Age of Onset   Hypertension Mother    Heart failure Mother    Heart attack Mother 29   Cancer Father    Diabetes Brother    Hypertension Brother    Heart attack Brother 50    Social History  reports that she has never smoked. She has never used smokeless tobacco. She reports that she does not drink alcohol and does not use drugs.  Allergies  Allergen Reactions   Hydrocodone  Nausea Only   Egg-Derived Products Rash   Haemophilus B Polysaccharide Vaccine Rash   Influenza Virus Vaccine Rash and Other (See Comments)    Develops a cold the day after receiving it- can tolerate the shot if it is received at a doctor's office (MUST be egg-free)   Metformin And Related Diarrhea    Medications  No current facility-administered medications for this encounter.  Current Outpatient Medications:    albuterol  (VENTOLIN  HFA) 108 (90 Base) MCG/ACT inhaler, Inhale 1-2 puffs into the lungs every 6 (six) hours as needed for wheezing or shortness of breath., Disp: , Rfl:    Ascorbic Acid (VITAMIN C) 1000 MG tablet, Take 1,000 mg by mouth 3 (three) times a week., Disp: , Rfl:    BAYER ASPIRIN  325 MG tablet, Take 325 mg by mouth daily., Disp: , Rfl:    carvedilol  (COREG ) 25 MG tablet, Take 1 tablet (25 mg total) by mouth 2 (two) times daily., Disp: 60 tablet, Rfl: 2   Cholecalciferol (VITAMIN D3) 1000 units CAPS, Take 1,000 Units by mouth daily., Disp: , Rfl:    diclofenac Sodium (VOLTAREN) 1 % GEL, Apply 2 g topically 4 (four) times daily as needed (for pain- affected sites)., Disp: , Rfl:    diltiazem  (CARDIZEM  CD) 360 MG 24 hr capsule, Take 360  mg by mouth daily., Disp: , Rfl:    docusate sodium  (COLACE) 100 MG capsule, Take 200 mg by mouth daily as needed for mild constipation., Disp: , Rfl:    DULoxetine (CYMBALTA) 60 MG capsule, Take 120 mg by mouth in the morning., Disp: , Rfl:    Fezolinetant (VEOZAH) 45 MG TABS, Take by mouth., Disp: , Rfl:    HUMULIN R  U-500 KWIKPEN 500 UNIT/ML KwikPen, Inject 50 Units into the skin daily with breakfast., Disp: , Rfl:    hydrALAZINE  (APRESOLINE ) 25 MG tablet, Take 25 mg by mouth 3 (three) times daily.  2tabs tid, Disp: , Rfl:    insulin  degludec (TRESIBA ) 200 UNIT/ML FlexTouch Pen, Inject into the skin., Disp: , Rfl:    ipratropium (ATROVENT ) 0.03 % nasal spray, Place 2 sprays into both nostrils 3 (three) times daily., Disp: , Rfl:    isosorbide  mononitrate (IMDUR ) 30 MG 24 hr tablet, Take 30 mg by mouth daily., Disp: , Rfl:    ketoconazole (NIZORAL) 2 % cream, Apply 1 Application topically daily., Disp: , Rfl:    metFORMIN (GLUCOPHAGE-XR) 500 MG 24 hr tablet, Take 1,000 mg by mouth in the morning and at bedtime., Disp: , Rfl:    methylPREDNISolone  (MEDROL  DOSEPAK) 4 MG TBPK tablet, 6 day dose pack - take as directed (Patient not taking: Reported on 09/07/2023), Disp: 21 tablet, Rfl: 0   metroNIDAZOLE  (FLAGYL ) 500 MG tablet, Take 1 tablet (500 mg total) by mouth 2 (two) times daily. (Patient not taking: Reported on 09/07/2023), Disp: 14 tablet, Rfl: 0   ondansetron  (ZOFRAN -ODT) 4 MG disintegrating tablet, Take 1 tablet (4 mg total) by mouth every 8 (eight) hours as needed for nausea or vomiting., Disp: 20 tablet, Rfl: 0   pregabalin (LYRICA) 75 MG capsule, Take 75 mg by mouth 3 (three) times daily., Disp: , Rfl:    spironolactone  (ALDACTONE ) 50 MG tablet, Take 50 mg by mouth 2 (two) times daily., Disp: , Rfl:    telmisartan (MICARDIS) 40 MG tablet, Take 40 mg by mouth 2 (two) times daily., Disp: , Rfl:    traMADol  (ULTRAM ) 50 MG tablet, Take 50 mg by mouth every 6 (six) hours as needed (for pain).,  Disp: , Rfl:    zolpidem  (AMBIEN ) 5 MG tablet, Take 5 mg by mouth at bedtime as needed for sleep., Disp: , Rfl:   Vitals   Vitals:   22-Jun-2024 2302  SpO2: 100%     BP (!) 147/99 (BP Location: Left Arm)   Pulse 82   Temp 98 F (36.7 C) (Temporal)   Resp 19   LMP 04/03/2018   SpO2 100%    Physical Exam   Constitutional: Appears well-developed and well-nourished.  Psych: Anxious affect .  Eyes: No scleral injection.  HENT: No OP obstruction.  Head: Normocephalic.  Respiratory: Effort normal, non-labored breathing.   Neurologic Examination   See NIHSS  Labs/Imaging/Neurodiagnostic studies   CBC:  Recent Labs  Lab Jun 22, 2024 2241 06-22-2024 2245  WBC  --  6.4  NEUTROABS  --  3.3  HGB 11.6* 10.3*  HCT 34.0* 33.8*  MCV  --  75.6*  PLT  --  211   Basic Metabolic Panel:  Lab Results  Component Value Date   NA 142 06/22/2024   K 3.5 06-22-24   CO2 22 05/31/2024   GLUCOSE 89 06-22-24   BUN 20 June 22, 2024   CREATININE 1.20 (H) June 22, 2024   CALCIUM  9.6 05/31/2024   GFRNONAA 52 (L) 05/31/2024   GFRAA >60 01/27/2020   Lipid Panel:  Lab Results  Component Value Date   LDLCALC 93 07/01/2013   HgbA1c:  Lab Results  Component Value Date   HGBA1C 6.4 (H) 12/22/2019   Urine Drug Screen: No results found for: LABOPIA, COCAINSCRNUR, LABBENZ, AMPHETMU, THCU, LABBARB  Alcohol Level No results found for: [facility] INR  Lab Results  Component Value Date   INR 1.0 05/31/2024   APTT  Lab Results  Component Value Date   APTT 34 05/31/2024   Recent prior CTA of head and neck (05/13/24): 1. Negative CTA for large vessel occlusion or other  emergent finding. 2. 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 3. No other acute intracranial abnormality. 4. Empty sella. While this finding is often incidental in nature and of no clinical significance, this can also be seen in the setting of idiopathic intracranial hypertension.  ASSESSMENT  53 y.o. female with a PMHx of  Anemia, DM, refractory HTN, with recent prior episode of hypertensive urgency, recently diagnosed cerebral aneurysm (6 mm right Pcom aneurysm) and obesity who presents to the ED via EMS as a Code Stroke for assessment of acute onset severe headache with speech deficit. LKN 2100, at which time she had sudden onset of the above symptoms. She describes the headache as 10/10 in intensity, being present in the posterior right occipitoparietal region. No head trauma. BP per EMS was 214/122. CBG 97 on arrival. She is on daily ASA at home.  - Neurological exam is nonfocal. BUE tremor with arms outstretched varies in amplitude and frequency, appearing most likely to be anxiety-related.  - CT head: No acute intracranial abnormality. ASPECTS is 10. Empty sella. - Impression: Severe headache and transient speech deficit in the setting of severely elevated BP and anxiety. Overall presentation is most consistent with hypertensive urgency.   RECOMMENDATIONS  - MRI brain - Aggressive BP management - Further recommendations following MRI  Addendum: - MRI brain:  1. Few small patchy foci of diffusion signal abnormality involving the subcortical right frontal lobe as above, consistent with evolving subacute small vessel type infarcts. No associated hemorrhage or mass effect. 2. No other acute intracranial abnormality. 3. Underlying mild chronic microvascular ischemic disease. 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 Recommend admission for optimization of her antihypertensive regimen ______________________________________________________________________    Bonney SHARK, Marte Celani, MD [facility]

## 2024-06-14 ENCOUNTER — Encounter (HOSPITAL_COMMUNITY): Payer: Self-pay | Admitting: Internal Medicine

## 2024-06-14 ENCOUNTER — Observation Stay (HOSPITAL_BASED_OUTPATIENT_CLINIC_OR_DEPARTMENT_OTHER)

## 2024-06-14 ENCOUNTER — Emergency Department (HOSPITAL_COMMUNITY)

## 2024-06-14 DIAGNOSIS — I639 Cerebral infarction, unspecified: Secondary | ICD-10-CM | POA: Diagnosis not present

## 2024-06-14 DIAGNOSIS — E1151 Type 2 diabetes mellitus with diabetic peripheral angiopathy without gangrene: Secondary | ICD-10-CM

## 2024-06-14 DIAGNOSIS — R29700 NIHSS score 0: Secondary | ICD-10-CM

## 2024-06-14 DIAGNOSIS — I63521 Cerebral infarction due to unspecified occlusion or stenosis of right anterior cerebral artery: Secondary | ICD-10-CM | POA: Diagnosis not present

## 2024-06-14 DIAGNOSIS — I63511 Cerebral infarction due to unspecified occlusion or stenosis of right middle cerebral artery: Secondary | ICD-10-CM | POA: Diagnosis not present

## 2024-06-14 DIAGNOSIS — E785 Hyperlipidemia, unspecified: Secondary | ICD-10-CM

## 2024-06-14 DIAGNOSIS — I6389 Other cerebral infarction: Secondary | ICD-10-CM | POA: Diagnosis not present

## 2024-06-14 DIAGNOSIS — I959 Hypotension, unspecified: Secondary | ICD-10-CM | POA: Diagnosis not present

## 2024-06-14 DIAGNOSIS — E119 Type 2 diabetes mellitus without complications: Secondary | ICD-10-CM | POA: Diagnosis not present

## 2024-06-14 DIAGNOSIS — N183 Chronic kidney disease, stage 3 unspecified: Secondary | ICD-10-CM | POA: Insufficient documentation

## 2024-06-14 DIAGNOSIS — I671 Cerebral aneurysm, nonruptured: Secondary | ICD-10-CM | POA: Insufficient documentation

## 2024-06-14 DIAGNOSIS — I10 Essential (primary) hypertension: Secondary | ICD-10-CM | POA: Diagnosis not present

## 2024-06-14 LAB — ECHOCARDIOGRAM COMPLETE
AR max vel: 2.84 cm2
AV Area VTI: 3.09 cm2
AV Area mean vel: 2.44 cm2
AV Mean grad: 5 mmHg
AV Peak grad: 6.3 mmHg
Ao pk vel: 1.25 m/s
Area-P 1/2: 3.34 cm2
S' Lateral: 3.2 cm
Weight: 4416.25 [oz_av]

## 2024-06-14 LAB — COMPREHENSIVE METABOLIC PANEL WITH GFR
ALT: 19 U/L (ref 0–44)
AST: 19 U/L (ref 15–41)
Albumin: 3.3 g/dL — ABNORMAL LOW (ref 3.5–5.0)
Alkaline Phosphatase: 66 U/L (ref 38–126)
Anion gap: 9 (ref 5–15)
BUN: 17 mg/dL (ref 6–20)
CO2: 23 mmol/L (ref 22–32)
Calcium: 9 mg/dL (ref 8.9–10.3)
Chloride: 107 mmol/L (ref 98–111)
Creatinine, Ser: 0.99 mg/dL (ref 0.44–1.00)
GFR, Estimated: >60 mL/min (ref >60)
Glucose, Bld: 120 mg/dL — ABNORMAL HIGH (ref 70–99)
Potassium: 3.5 mmol/L (ref 3.5–5.1)
Sodium: 139 mmol/L (ref 135–145)
Total Bilirubin: 0.8 mg/dL (ref 0.0–1.2)
Total Protein: 5.9 g/dL — ABNORMAL LOW (ref 6.5–8.1)

## 2024-06-14 LAB — CBC
HCT: 29.4 % — ABNORMAL LOW (ref 36.0–46.0)
Hemoglobin: 9.1 g/dL — ABNORMAL LOW (ref 12.0–15.0)
MCH: 23.2 pg — ABNORMAL LOW (ref 26.0–34.0)
MCHC: 31 g/dL (ref 30.0–36.0)
MCV: 75 fL — ABNORMAL LOW (ref 80.0–100.0)
Platelets: 173 K/uL (ref 150–400)
RBC: 3.92 MIL/uL (ref 3.87–5.11)
RDW: 14.9 % (ref 11.5–15.5)
WBC: 5.5 K/uL (ref 4.0–10.5)
nRBC: 0 % (ref 0.0–0.2)

## 2024-06-14 LAB — RAPID URINE DRUG SCREEN, HOSP PERFORMED
Amphetamines: NOT DETECTED
Barbiturates: NOT DETECTED
Benzodiazepines: NOT DETECTED
Cocaine: NOT DETECTED
Opiates: NOT DETECTED
Tetrahydrocannabinol: NOT DETECTED

## 2024-06-14 LAB — LIPID PANEL
Cholesterol: 101 mg/dL (ref 0–200)
HDL: 38 mg/dL — ABNORMAL LOW (ref >40)
LDL Cholesterol: 46 mg/dL (ref 0–99)
Total CHOL/HDL Ratio: 2.7 ratio
Triglycerides: 87 mg/dL (ref <150)
VLDL: 17 mg/dL (ref 0–40)

## 2024-06-14 LAB — HEMOGLOBIN A1C
Hgb A1c MFr Bld: 7.5 % — ABNORMAL HIGH (ref 4.8–5.6)
Mean Plasma Glucose: 169 mg/dL

## 2024-06-14 LAB — CBG MONITORING, ED
Glucose-Capillary: 97 mg/dL (ref 70–99)
Glucose-Capillary: 122 mg/dL — ABNORMAL HIGH (ref 70–99)
Glucose-Capillary: 157 mg/dL — ABNORMAL HIGH (ref 70–99)
Glucose-Capillary: 61 mg/dL — ABNORMAL LOW (ref 70–99)
Glucose-Capillary: 62 mg/dL — ABNORMAL LOW (ref 70–99)
Glucose-Capillary: 83 mg/dL (ref 70–99)

## 2024-06-14 MED ORDER — DILTIAZEM HCL ER COATED BEADS 180 MG PO CP24: 360.0000 mg | ORAL_CAPSULE | Freq: Every day | ORAL | Status: DC

## 2024-06-14 MED ORDER — INSULIN GLARGINE-YFGN 100 UNIT/ML ~~LOC~~ SOLN: 56.0000 [IU] | Freq: Every day | SUBCUTANEOUS | Status: DC

## 2024-06-14 MED ORDER — INSULIN REGULAR HUMAN (CONC) 500 UNIT/ML ~~LOC~~ SOPN
60.0000 [IU] | PEN_INJECTOR | Freq: Every day | SUBCUTANEOUS | Status: DC
  Administered 2024-06-14: 60 [IU] via SUBCUTANEOUS
  Filled 2024-06-14: qty 3

## 2024-06-14 MED ORDER — ASPIRIN 81 MG PO CHEW
81.0000 mg | CHEWABLE_TABLET | Freq: Every day | ORAL | Status: DC
  Administered 2024-06-14: 81 mg via ORAL
  Filled 2024-06-14: qty 1

## 2024-06-14 MED ORDER — ACETAMINOPHEN 500 MG PO TABS
1000.0000 mg | ORAL_TABLET | Freq: Once | ORAL | Status: AC
  Administered 2024-06-14: 1000 mg via ORAL
  Filled 2024-06-14: qty 2

## 2024-06-14 MED ORDER — PANTOPRAZOLE SODIUM 40 MG PO TBEC
40.0000 mg | DELAYED_RELEASE_TABLET | Freq: Every day | ORAL | Status: DC
  Administered 2024-06-14: 40 mg via ORAL
  Filled 2024-06-14: qty 1

## 2024-06-14 MED ORDER — ACETAMINOPHEN 160 MG/5ML PO SOLN: 650.0000 mg | ORAL | Status: DC | PRN

## 2024-06-14 MED ORDER — CARVEDILOL 12.5 MG PO TABS
25.0000 mg | ORAL_TABLET | Freq: Two times a day (BID) | ORAL | Status: DC
  Administered 2024-06-14 (×2): 25 mg via ORAL
  Filled 2024-06-14 (×2): qty 2

## 2024-06-14 MED ORDER — ENOXAPARIN SODIUM 40 MG/0.4ML IJ SOSY
40.0000 mg | PREFILLED_SYRINGE | Freq: Every day | INTRAMUSCULAR | Status: DC
  Administered 2024-06-14: 40 mg via SUBCUTANEOUS
  Filled 2024-06-14: qty 0.4

## 2024-06-14 MED ORDER — INSULIN DEGLUDEC 200 UNIT/ML ~~LOC~~ SOPN: 56.0000 [IU] | PEN_INJECTOR | Freq: Every day | SUBCUTANEOUS | Status: DC

## 2024-06-14 MED ORDER — ACETAMINOPHEN 650 MG RE SUPP: 650.0000 mg | RECTAL | Status: DC | PRN

## 2024-06-14 MED ORDER — HYDRALAZINE HCL 25 MG PO TABS
25.0000 mg | ORAL_TABLET | Freq: Three times a day (TID) | ORAL | Status: DC
  Administered 2024-06-14 (×2): 25 mg via ORAL
  Filled 2024-06-14 (×2): qty 1

## 2024-06-14 MED ORDER — LORAZEPAM 1 MG PO TABS: 1.0000 mg | ORAL_TABLET | Freq: Every day | ORAL | Status: DC | PRN

## 2024-06-14 MED ORDER — SPIRONOLACTONE 25 MG PO TABS
50.0000 mg | ORAL_TABLET | Freq: Two times a day (BID) | ORAL | Status: DC
  Administered 2024-06-14 (×2): 50 mg via ORAL
  Filled 2024-06-14 (×2): qty 2

## 2024-06-14 MED ORDER — ACETAMINOPHEN 325 MG PO TABS
650.0000 mg | ORAL_TABLET | ORAL | Status: DC | PRN
  Administered 2024-06-14: 650 mg via ORAL
  Filled 2024-06-14: qty 2

## 2024-06-14 MED ORDER — ATORVASTATIN CALCIUM 80 MG PO TABS
80.0000 mg | ORAL_TABLET | Freq: Every day | ORAL | Status: DC
  Administered 2024-06-14: 80 mg via ORAL
  Filled 2024-06-14: qty 1

## 2024-06-14 MED ORDER — STROKE: EARLY STAGES OF RECOVERY BOOK: Freq: Once | Status: DC

## 2024-06-14 MED ORDER — BUDESONIDE 0.5 MG/2ML IN SUSP
0.5000 mg | Freq: Two times a day (BID) | RESPIRATORY_TRACT | Status: DC
  Administered 2024-06-14: 0.5 mg via RESPIRATORY_TRACT
  Filled 2024-06-14: qty 2

## 2024-06-14 MED ORDER — ISOSORBIDE MONONITRATE ER 30 MG PO TB24
30.0000 mg | ORAL_TABLET | Freq: Every day | ORAL | Status: DC
  Administered 2024-06-14: 30 mg via ORAL
  Filled 2024-06-14: qty 1

## 2024-06-14 MED ORDER — IRBESARTAN 300 MG PO TABS
150.0000 mg | ORAL_TABLET | Freq: Every day | ORAL | Status: DC
  Administered 2024-06-14: 150 mg via ORAL
  Filled 2024-06-14: qty 1

## 2024-06-14 MED ORDER — SODIUM CHLORIDE 0.9 % IV SOLN: INTRAVENOUS | Status: DC

## 2024-06-14 MED ORDER — ALBUTEROL SULFATE (2.5 MG/3ML) 0.083% IN NEBU: 2.5000 mg | INHALATION_SOLUTION | Freq: Four times a day (QID) | RESPIRATORY_TRACT | Status: DC | PRN

## 2024-06-14 MED ORDER — TICAGRELOR 90 MG PO TABS
90.0000 mg | ORAL_TABLET | Freq: Two times a day (BID) | ORAL | Status: DC
  Administered 2024-06-14: 90 mg via ORAL
  Filled 2024-06-14: qty 1

## 2024-06-14 MED ORDER — INSULIN ASPART 100 UNIT/ML IJ SOLN
0.0000 [IU] | Freq: Three times a day (TID) | INTRAMUSCULAR | Status: DC
  Administered 2024-06-14: 1 [IU] via SUBCUTANEOUS
  Administered 2024-06-14: 2 [IU] via SUBCUTANEOUS

## 2024-06-14 NOTE — Evaluation (Signed)
 Occupational Therapy Evaluation Patient Details Name: Grace Gallagher MRN: [id number] DOB: [birth year] Today's Date: 06/14/2024   History of Present Illness   Grace Gallagher is a 53 y.o. female who presented with complaints of severe headache in the occipital area. SBP was more than 220 on EMS arrival.  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 PMHx: hypertension, diabetes mellitus type 2, hyperlipidemia, chronic kidney disease stage III, anemia, recently diagnosed cerebral aneurysm 6 mm right posterior communicating     Clinical Impressions Grace Gallagher was evaluated s/p the above admission list. She is mod I with SPC at baseline, her children assist her with IADLs as needed. Pt also endorses several falls and recent impaired R eye vision (acuity and diplopia). Upon evaluation the pt was limited by global weakness, chronic RLE paraesthesias, poor ROM of BLEs, headache, new LLE numbness, unsteady gait and decreased activity tolerance. Overall she required up to minA for transfers and mobility with RW. Due to the deficits listed below the pt also needs up to mod A for LB ADLs and set up for UB ADLs in sitting. Pt will benefit from continued acute OT services and OP OT and increased assist from family as needed.       If plan is discharge home, recommend the following:   A little help with walking and/or transfers;A lot of help with bathing/dressing/bathroom;Assistance with cooking/housework;Assist for transportation     Functional Status Assessment   Patient has had a recent decline in their functional status and demonstrates the ability to make significant improvements in function in a reasonable and predictable amount of time.     Equipment Recommendations   Other (comment) (RW)      Precautions/Restrictions   Precautions Precautions: Fall Restrictions Weight Bearing Restrictions Per Provider Order: No     Mobility Bed Mobility Overal bed mobility: Needs  Assistance Bed Mobility: Supine to Sit     Supine to sit: Min assist          Transfers Overall transfer level: Needs assistance Equipment used: Rolling walker (2 wheels) Transfers: Sit to/from Stand Sit to Stand: Min assist           General transfer comment: CGA for elevated surface, Min A for lower surfaces (ex. toilet)      Balance Overall balance assessment: Needs assistance Sitting-balance support: Feet supported Sitting balance-Leahy Scale: Good     Standing balance support: Single extremity supported, During functional activity Standing balance-Leahy Scale: Fair Standing balance comment: statically at the sink                           ADL either performed or assessed with clinical judgement   ADL Overall ADL's : Needs assistance/impaired Eating/Feeding: Independent;Sitting   Grooming: Contact guard assist;Standing Grooming Details (indicate cue type and reason): at sink Upper Body Bathing: Set up;Sitting   Lower Body Bathing: Moderate assistance;Sit to/from stand   Upper Body Dressing : Set up;Sitting   Lower Body Dressing: Moderate assistance;Sit to/from stand Lower Body Dressing Details (indicate cue type and reason): poor ROM of BLEs Toilet Transfer: Minimal assistance;Ambulation;Regular Toilet;Rolling walker (2 wheels)   Toileting- Clothing Manipulation and Hygiene: Supervision/safety;Sitting/lateral lean       Functional mobility during ADLs: Minimal assistance;Rolling walker (2 wheels) General ADL Comments: Pt reported new LLE numbness     Vision Baseline Vision/History: 1 Wears glasses Patient Visual Report: Diplopia (biplopia present at end range of R visual field) Vision Assessment?: Vision  impaired- to be further tested in functional context Additional Comments: chronic R eye vision inmapirment, pt reports reduce acuity of R eye and intermittent DV. Diplopia present at end range of R field     Perception Perception: Not  tested       Praxis Praxis: Not tested       Pertinent Vitals/Pain Pain Assessment Pain Assessment: 0-10 Pain Score: 7  Pain Location: headache Pain Descriptors / Indicators: Headache Pain Intervention(s): Monitored during session     Extremity/Trunk Assessment Upper Extremity Assessment Upper Extremity Assessment: Generalized weakness;Right hand dominant (bilateral UEs globally 4/5, slowed coordination but [facility] for ADLs. Pt reports chronic intermittent R hand numbness but no numbness present during evaluation)   Lower Extremity Assessment Lower Extremity Assessment: Defer to PT evaluation   Cervical / Trunk Assessment Cervical / Trunk Assessment: Normal   Communication Communication Communication: No apparent difficulties   Cognition Arousal: Alert Behavior During Therapy: Flat affect Cognition: No apparent impairments             OT - Cognition Comments: Overall WFL for basic tasks assessed. Followed all commands. Slowed processing noted throughout                 Following commands: Intact       Cueing  General Comments   Cueing Techniques: Verbal cues  VSS on RA    Home Living Family/patient expects to be discharged to:: Private residence Living Arrangements: Children Available Help at Discharge: Family;Available 24 hours/day Type of Home: House Home Access: Level entry     Home Layout: One level     Bathroom Shower/Tub: Chief Strategy Officer: Standard     Home Equipment: Cane - single point          Prior Functioning/Environment Prior Level of Function : Independent/Modified Independent             Mobility Comments: SPC for all mobility, frequent falls, 2 in the last month but several prior to those ADLs Comments: mod I for ADLs, children assist with IADLs as needed    OT Problem List: Decreased strength;Decreased range of motion;Decreased activity tolerance;Impaired balance (sitting and/or standing);Impaired  vision/perception;Decreased safety awareness;Decreased knowledge of use of DME or AE;Decreased knowledge of precautions;Impaired sensation   OT Treatment/Interventions: Self-care/ADL training;Therapeutic exercise;Neuromuscular education;DME and/or AE instruction;Therapeutic activities;Balance training;Patient/family education      OT Goals(Current goals can be found in the care plan section)   Acute Rehab OT Goals Patient Stated Goal: home OT Goal Formulation: With patient Time For Goal Achievement: 06/28/24 Potential to Achieve Goals: Good ADL Goals Pt Will Perform Upper Body Dressing: with modified independence Pt Will Perform Lower Body Dressing: with modified independence;with adaptive equipment Pt Will Transfer to Toilet: with modified independence;ambulating Additional ADL Goal #1: Pt will indep recall at least 3 fall prevention strategies to increase safety at discharge   OT Frequency:  Min 2X/week    Co-evaluation PT/OT/SLP Co-Evaluation/Treatment: Yes Reason for Co-Treatment: Complexity of the patient's impairments (multi-system involvement);For patient/therapist safety;To address functional/ADL transfers   OT goals addressed during session: ADL's and self-care      AM-PAC OT 6 Clicks Daily Activity     Outcome Measure Help from another person eating meals?: None Help from another person taking care of personal grooming?: A Little Help from another person toileting, which includes using toliet, bedpan, or urinal?: A Little Help from another person bathing (including washing, rinsing, drying)?: A Lot Help from another person to put on and taking  off regular upper body clothing?: A Little Help from another person to put on and taking off regular lower body clothing?: A Lot 6 Click Score: 17   End of Session Equipment Utilized During Treatment: Rolling walker (2 wheels) Nurse Communication: Mobility status  Activity Tolerance: Patient tolerated treatment  well Patient left: in bed;with call bell/phone within reach  OT Visit Diagnosis: Unsteadiness on feet (R26.81);Muscle weakness (generalized) (M62.81);History of falling (Z91.81);Other abnormalities of gait and mobility (R26.89);Repeated falls (R29.6);Hemiplegia and hemiparesis Hemiplegia - Right/Left: Left Hemiplegia - dominant/non-dominant: Non-Dominant Hemiplegia - caused by: Cerebral infarction                Time: 8984-8956 OT Time Calculation (min): 28 min Charges:  OT General Charges $OT Visit: 1 Visit OT Evaluation $OT Eval Moderate Complexity: 1 Mod  Grace Gallagher, OTR/L Acute Rehabilitation Services Office [phone] Secure Chat Communication Preferred   Grace Gallagher 06/14/2024, 10:56 AM

## 2024-06-14 NOTE — Progress Notes (Addendum)
 STROKE TEAM PROGRESS NOTE     INTERIM HISTORY/SUBJECTIVE No family at the bedside.  Patient is sitting on the side of the bed eating breakfast in no apparent distress She states yesterday she had shortness of breath and a headache which has now resolved  CBC    Component Value Date/Time   WBC 5.5 06/14/2024 0646   RBC 3.92 06/14/2024 0646   HGB 9.1 (L) 06/14/2024 0646   HGB 10.3 (L) 06/03/2014 1856   HCT 29.4 (L) 06/14/2024 0646   HCT 34.2 (L) 06/03/2014 1856   PLT 173 06/14/2024 0646   PLT 245 06/03/2014 1856   MCV 75.0 (L) 06/14/2024 0646   MCV 72 (L) 06/03/2014 1856   MCH 23.2 (L) 06/14/2024 0646   MCHC 31.0 06/14/2024 0646   RDW 14.9 06/14/2024 0646   RDW 15.2 (H) 06/03/2014 1856   LYMPHSABS 2.4 06/13/2024 2245   MONOABS 0.4 06/13/2024 2245   EOSABS 0.2 06/13/2024 2245   BASOSABS 0.0 06/13/2024 2245    BMET    Component Value Date/Time   NA 139 06/14/2024 0646   NA 140 06/03/2014 1856   K 3.5 06/14/2024 0646   K 3.5 06/03/2014 1856   CL 107 06/14/2024 0646   CL 106 06/03/2014 1856   CO2 23 06/14/2024 0646   CO2 28 06/03/2014 1856   GLUCOSE 120 (H) 06/14/2024 0646   GLUCOSE 73 06/03/2014 1856   BUN 17 06/14/2024 0646   BUN 10 06/03/2014 1856   CREATININE 0.99 06/14/2024 0646   CREATININE 0.85 06/03/2014 1856   CREATININE 0.72 05/30/2014 1553   CALCIUM  9.0 06/14/2024 0646   CALCIUM  8.5 06/03/2014 1856   GFRNONAA >60 06/14/2024 0646   GFRNONAA >60 06/03/2014 1856    IMAGING past 24 hours MR BRAIN WO CONTRAST Result Date: 06/14/2024 CLINICAL DATA:  Initial evaluation for acute neuro deficit, stroke suspected. EXAM: MRI HEAD WITHOUT CONTRAST TECHNIQUE: Multiplanar, multiecho pulse sequences of the brain and surrounding structures were obtained without intravenous contrast. COMPARISON:  CT from 06/13/2024 and MRI from 05/13/2024 FINDINGS: Brain: Cerebral volume within normal limits. Mild chronic microvascular ischemic disease noted involving the supratentorial  cerebral white matter. 1.4 cm linear focus of diffusion signal abnormality seen involving the subcortical right frontal lobe (series 7, image 57). Associated T2/FLAIR signal without ADC correlate. This is new as compared to prior MRI, and is consistent with an evolving subacute small vessel type infarct. Additional 4 mm focus noted slightly posteriorly and inferiorly (series 5, image 85). No other evidence for acute or subacute ischemia. Gray-white matter differentiation otherwise maintained. No areas of chronic cortical infarction. No acute intracranial hemorrhage. Single punctate chronic microhemorrhage noted within the left thalamus. No mass lesion, midline shift or mass effect. No hydrocephalus or extra-axial fluid collection. Prominent empty sella noted. The native pituitary gland is likely effaced at the right aspect of the sella (series 15, image 20). Vascular: Major intracranial vascular flow voids are maintained. Skull and upper cervical spine: Craniocervical junction within normal limits. Bone marrow signal intensity within normal limits. No scalp soft tissue abnormality. Sinuses/Orbits: Globes orbital soft tissues within normal limits. Scattered mucosal thickening noted about the frontal ethmoidal and maxillary sinuses. Small right with trace left mastoid effusions noted, of doubtful significance. Other: None. IMPRESSION: 1. Few small patchy foci of diffusion signal abnormality involving the subcortical right frontal lobe as above, consistent with evolving subacute small vessel type infarcts. No associated hemorrhage or mass effect. 2. No other acute intracranial abnormality. 3. Underlying mild chronic microvascular  ischemic disease. Electronically Signed   By: Morene Hoard M.D.   On: 06/14/2024 02:27   CT HEAD CODE STROKE WO CONTRAST Result Date: 06/13/2024 CLINICAL DATA:  Code stroke. Initial evaluation for acute neuro deficit. EXAM: CT HEAD WITHOUT CONTRAST TECHNIQUE: Contiguous axial images  were obtained from the base of the skull through the vertex without intravenous contrast. RADIATION DOSE REDUCTION: This exam was performed according to the departmental dose-optimization program which includes automated exposure control, adjustment of the mA and/or kV according to patient size and/or use of iterative reconstruction technique. COMPARISON:  None Available. FINDINGS: Brain: Cerebral volume within normal limits. No acute intracranial hemorrhage. No acute large vessel territory infarct. No mass lesion or midline shift. No hydrocephalus or extra-axial fluid collection. Empty sella noted. Vascular: No abnormal hyperdense vessel. Skull: Scalp soft tissues within normal limits.  Calvarium intact. Sinuses/Orbits: Globes orbital soft tissues within normal limits. Scattered mucosal thickening present about the ethmoidal air cells and maxillary sinuses. Small right mastoid effusion noted. Other: None. ASPECTS [facility] Stroke Program Early CT Score) - Ganglionic level infarction (caudate, lentiform nuclei, internal capsule, insula, M1-M3 cortex): 7 - Supraganglionic infarction (M4-M6 cortex): 3 Total score (0-10 with 10 being normal): 10 IMPRESSION: 1. No acute intracranial abnormality. 2. ASPECTS is 10. 3. Empty sella. These results were communicated to Dr. Lindzen at 10:53 pm on 06/13/2024 by text page via the [employer] messaging system. Electronically Signed   By: Morene Hoard M.D.   On: 06/13/2024 22:54    Vitals:   06/14/24 1300 06/14/24 1303 06/14/24 1408 06/14/24 1445  BP: 137/77 137/77 (!) 155/91 (!) 149/88  Pulse: 82 81 74 78  Resp: 14 20 19 18   Temp:  98 F (36.7 C)    TempSrc:  Oral    SpO2: 100% 98% 99% 99%     PHYSICAL EXAM General:  Alert, well-nourished, well-developed patient in no acute distress Psych:  Mood and affect appropriate for situation CV: Regular rate and rhythm on monitor Respiratory:  Regular, unlabored respirations on room air GI: Abdomen soft and  nontender   NEURO:  Mental Status: AA&Ox3, patient is able to give clear and coherent history Speech/Language: speech is without dysarthria or aphasia.  Naming, repetition, fluency, and comprehension intact.  Cranial Nerves:  II: PERRL. Visual fields full.  III, IV, VI: EOMI. Eyelids elevate symmetrically.  V: Sensation is intact to light touch and symmetrical to face.  VII: Face is symmetrical resting and smiling VIII: hearing intact to voice. IX, X: Palate elevates symmetrically. Phonation is normal.  KP:Dynloizm shrug 5/5. XII: tongue is midline without fasciculations. Motor: 5/5 strength to all muscle groups tested.  Tone: is normal and bulk is normal Sensation- Intact to light touch bilaterally. Extinction absent to light touch to DSS.   Coordination: FTN intact bilaterally, HKS: no ataxia in BLE.No drift.  Gait- deferred  Most Recent NIH 0   ASSESSMENT/PLAN  [name] is a 53 y.o. 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 Code Stroke  CT head No acute abnormality.  ASPECTS 10.     MRI  Few small patchy foci of diffusion signal abnormality involving the subcortical right frontal lobe as above, consistent with evolving subacute small vessel type infarcts. 2D Echo EF 60 to 65% LDL 46 HgbA1c pending UDS negative VTE prophylaxis - Lovenox   aspirin  81 mg daily and Brilinta  (ticagrelor ) 90 mg bid prior to  admission, now on home aspirin  81 mg daily and Brilinta  (ticagrelor ) 90 mg bid for aneurysm repair next week Therapy recommendations: Outpatient PT OT Disposition:  pending   PCOM aneurysm  CTA head and neck 6/9 5 x 6 x 5 mm right PCOM aneurysm  Status post IR with Dr. Lanis showed 6 mm right P-comm aneurysm Scheduled with neurosurgery for intervention next week 7/17 On ASA and brilinta    Hypertensive Urgency  Home meds: Carvedilol  25 mg, Cardizem  360 mg, hydralazine  25 mg, Micardis 40 mg, Imdur  30 mg, spironolactone  50 mg Reported low 79/56 at home but improved after spaced out BP meds Long-term BP goal normotensive  Hyperlipidemia Home meds: Atorvastatin  80 mg,  resumed in hospital LDL 46, goal < 70 Continue statin at discharge  Diabetes type II UnControlled Home meds: Tresiba , insulin , Ozempic HgbA1c pending goal < 7.0 CBGs SSI Recommend close follow-up with PCP for better DM control  Other Stroke Risk Factors  ETOH use, alcohol level <15, advised to drink no more than 1 drink(s) a day  Obesity, BMI >/= 30 associated with increased stroke risk, recommend weight loss, diet and exercise as appropriate  OSA  Other Active Problems COPD  Hospital day # 0   Karna Geralds DNP, ACNPC-AG  [facility]  ATTENDING NOTE: I reviewed above note and agree with the assessment and plan. Pt was seen and examined.   No family at bedside.  Patient sitting at edge of bed, eating breakfast.  Patient stated that her BP was recently low at 79/56 before she is spaced out all her BP medications.  She had a currently 6 different p.o. BP meds at home for BP control.  She also has appointment with Dr. Lanis next week for P-comm aneurysm repair, currently on aspirin  Brilinta .  Continue at this time.  Continue Lipitor.  Recommend home BP monitoring.  Avoid low or high BP.  PT and OT recommend outpatient.  Continue follow-up with Dr. Lanis.  For detailed assessment and plan,  please refer to above as I have made changes wherever appropriate.   Neurology will sign off. Please call with questions. Pt will follow up with stroke clinic NP at [facility] in about 4 weeks. Thanks for the consult.   Ary Cummins, MD PhD Stroke Neurology 06/14/2024 6:13 PM    To contact Stroke Continuity provider, please refer to [email or web address]. After hours, contact General Neurology

## 2024-06-14 NOTE — ED Notes (Signed)
 Meal tray ordered for patient.

## 2024-06-14 NOTE — H&P (Signed)
 History and Physical    Grace Gallagher FMW:[id number] DOB: Feb 17, 1971 DOA: 06/13/2024  Patient coming from: Home.  Chief Complaint: Headache.  HPI: Grace Gallagher is a 53 y.o. female with history of hypertension, diabetes mellitus type 2, hyperlipidemia, chronic kidney disease stage III, anemia, recently diagnosed cerebral aneurysm 6 mm right posterior communicating was brought to the ER after patient was complaining of severe headache occipital area last evening.  Patient states she initially was short of breath then she used her inhaler following which her shortness of breath improved but started developing occipital headache which was severe.  EMS was called and EMS on arrival noticed that patient had difficulty speaking and also some twitching of the face.  Blood pressure systolic was noticed to be more than 220.  ED Course: In the ER patient was evaluated by neurologist.  Patient's symptoms at arrival has largely improved.  CT head did not show anything acute.  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  Patient admitted for further stroke workup.  Patient passed stroke swallow screen.  EKG shows normal sinus rhythm potassium was 3.4 hemoglobin 10.3.  Review of Systems: As per HPI, rest all negative.   Past Medical History:  Diagnosis Date   Anemia    Diabetes mellitus without complication (HCC)    Hypertension    Obesity     Past Surgical History:  Procedure Laterality Date   ABDOMINAL HYSTERECTOMY     BREAST SURGERY  08/2011   breast reduction   CYSTOSCOPY  04/12/2018   Procedure: CYSTOSCOPY;  Surgeon: Arloa Lamar SQUIBB, MD;  Location: [facility] ORS;  Service: Gynecology;;   FOOT SURGERY Left    cyst removed   GALLBLADDER SURGERY  09/03/2018   IR ANGIO INTRA EXTRACRAN SEL INTERNAL CAROTID BILAT MOD SED  05/31/2024   IR ANGIO VERTEBRAL SEL VERTEBRAL BILAT MOD SED  05/31/2024   LAPAROSCOPIC HYSTERECTOMY Bilateral 04/12/2018   Procedure: HYSTERECTOMY TOTAL LAPAROSCOPIC  BILATERAL SALPINGECTOMY;  Surgeon: Arloa Lamar SQUIBB, MD;  Location: [facility] ORS;  Service: Gynecology;  Laterality: Bilateral;   TUBAL LIGATION       reports that she has never smoked. She has never used smokeless tobacco. She reports that she does not drink alcohol and does not use drugs.  Allergies  Allergen Reactions   Duloxetine Other (See Comments)    Increased somnolence.    Egg-Derived Products Rash   Haemophilus B Polysaccharide Vaccine Rash   Influenza Virus Vaccine Rash and Other (See Comments)    Develops a cold the day after receiving it- can tolerate the shot if it is received at a doctor's office (MUST be egg-free)   Atrovent  Nasal Spray [Ipratropium] Other (See Comments)    Causes nose bleed   Hydrocodone  Nausea Only   Metformin And Related Diarrhea    Family History  Problem Relation Age of Onset   Hypertension Mother    Heart failure Mother    Heart attack Mother 86   Cancer Father    Diabetes Brother    Hypertension Brother    Heart attack Brother 52    Prior to Admission medications   Medication Sig Start Date End Date Taking? Authorizing Provider  acetaminophen  (TYLENOL ) 500 MG tablet Take 1,000 mg by mouth every 6 (six) hours as needed for mild pain (pain score 1-3) or headache.   Yes [provider]  albuterol  (ACCUNEB ) 1.25 MG/3ML nebulizer solution Take 1 ampule by nebulization every 6 (six) hours as needed for wheezing or shortness  of breath. 04/25/24  Yes [provider]  albuterol  (VENTOLIN  HFA) 108 (90 Base) MCG/ACT inhaler Inhale 2 puffs into the lungs every 6 (six) hours as needed for wheezing or shortness of breath.   Yes [provider]  Ashwagandha 300 MG TABS Take 300 mg by mouth daily.   Yes [provider]  aspirin  81 MG chewable tablet Chew 81 mg by mouth daily. 06/11/24  Yes [provider]  atorvastatin  (LIPITOR) 80 MG tablet Take 80 mg by mouth daily. 05/20/24  Yes [provider]  BAYER  ASPIRIN  325 MG tablet Take 325 mg by mouth daily.   Yes [provider]  carvedilol  (COREG ) 25 MG tablet Take 1 tablet (25 mg total) by mouth 2 (two) times daily. 01/03/20 11/03/24 Yes Vannie Reche RAMAN, NP  Cholecalciferol (VITAMIN D3) 1000 units CAPS Take 1,000 Units by mouth daily.   Yes [provider]  diltiazem  (CARDIZEM  CD) 360 MG 24 hr capsule Take 360 mg by mouth at bedtime.   Yes [provider]  fluticasone  (FLONASE ) 50 MCG/ACT nasal spray Place 2 sprays into both nostrils daily. 05/13/24  Yes [provider]  fluticasone  (FLOVENT  HFA) 220 MCG/ACT inhaler Inhale 2 puffs into the lungs 2 (two) times daily. 04/13/24  Yes [provider]  HUMULIN R  U-500 KWIKPEN 500 UNIT/ML KwikPen Inject 60 Units into the skin daily with breakfast.   Yes [provider]  hydrALAZINE  (APRESOLINE ) 25 MG tablet Take 25 mg by mouth 3 (three) times daily. Take two tablets by mouth three time daily.   Yes [provider]  insulin  degludec (TRESIBA ) 200 UNIT/ML FlexTouch Pen Inject 56 Units into the skin at bedtime. Inject 56 units subcutaneously at bedtime   Yes [provider]  Insulin  Disposable Pump (OMNIPOD 5 [id number] INTRO GEN 5) KIT Inject into the skin. 05/17/24  Yes [provider]  isosorbide  mononitrate (IMDUR ) 30 MG 24 hr tablet Take 30 mg by mouth daily.   Yes [provider]  ketoconazole (NIZORAL) 2 % cream Apply 1 Application topically daily. Apply a thin layer to face daily.   Yes [provider]  omeprazole (PRILOSEC) 20 MG capsule Take 20 mg by mouth daily. 06/10/24  Yes [provider]  OZEMPIC, 2 MG/DOSE, 8 MG/3ML SOPN Inject 2 mg into the skin once a week. Inject 2mg  subcutaneously once per week on Friday. 05/28/24  Yes [provider]  spironolactone  (ALDACTONE ) 50 MG tablet Take 50 mg by mouth 2 (two) times daily.   Yes [provider]  telmisartan (MICARDIS) 40 MG tablet Take  40 mg by mouth 2 (two) times daily.   Yes [provider]  ticagrelor  (BRILINTA ) 90 MG TABS tablet Take 90 mg by mouth 2 (two) times daily. 06/12/24  Yes [provider]  traMADol  (ULTRAM ) 50 MG tablet Take 50 mg by mouth every 6 (six) hours as needed (for pain).   Yes [provider]  zolpidem  (AMBIEN ) 5 MG tablet Take 5 mg by mouth at bedtime as needed for sleep.   Yes [provider]    Physical Exam: Constitutional: Moderately built and nourished. Vitals:   06/14/24 0100 06/14/24 0130 06/14/24 0300 06/14/24 0517  BP: (!) 140/87 139/87 (!) 145/84 138/85  Pulse: 72 81 74 76  Resp: 13 15 13 12   Temp:    98.4 F (36.9 C)  TempSrc:    Oral  SpO2: 98% 98% 98% 97%   Eyes: Anicteric no pallor. ENMT: No discharge  from the ears/nose or mouth. Neck: No mass felt.  No neck rigidity. Respiratory: No rhonchi or crepitations. Cardiovascular: S1-S2 heard. Abdomen: Soft nontender bowel sound present. Musculoskeletal: No edema. Skin: No rash. Neurologic: Alert awake oriented to time place and person.  Moves all extremities 5 x 5.  No facial asymmetry tongue is midline pupils equal and reacting to light. Psychiatric: Appears normal.  Normal affect.   Labs on Admission: I have personally reviewed following labs and imaging studies  CBC: Recent Labs  Lab 06/13/24 2241 06/13/24 2245  WBC  --  6.4  NEUTROABS  --  3.3  HGB 11.6* 10.3*  HCT 34.0* 33.8*  MCV  --  75.6*  PLT  --  211   Basic Metabolic Panel: Recent Labs  Lab 06/13/24 2241 06/13/24 2245  NA 142 140  K 3.5 3.4*  CL 107 106  CO2  --  24  GLUCOSE 89 92  BUN 20 18  CREATININE 1.20* 1.08*  CALCIUM   --  9.6   GFR: Estimated Creatinine Clearance: 82.4 mL/min (A) (by C-G formula based on SCr of 1.08 mg/dL (H)). Liver Function Tests: Recent Labs  Lab 06/13/24 2245  AST 23  ALT 21  ALKPHOS 78  BILITOT 1.0  PROT 6.8  ALBUMIN 3.8   No results for input(s): LIPASE, AMYLASE in  the last 168 hours. No results for input(s): AMMONIA in the last 168 hours. Coagulation Profile: Recent Labs  Lab 06/13/24 2245  INR 0.9   Cardiac Enzymes: No results for input(s): CKTOTAL, CKMB, CKMBINDEX, TROPONINI in the last 168 hours. BNP (last 3 results) No results for input(s): PROBNP in the last 8760 hours. HbA1C: No results for input(s): HGBA1C in the last 72 hours. CBG: No results for input(s): GLUCAP in the last 168 hours. Lipid Profile: No results for input(s): CHOL, HDL, LDLCALC, TRIG, CHOLHDL, LDLDIRECT in the last 72 hours. Thyroid  Function Tests: No results for input(s): TSH, T4TOTAL, FREET4, T3FREE, THYROIDAB in the last 72 hours. Anemia Panel: No results for input(s): VITAMINB12, FOLATE, FERRITIN, TIBC, IRON , RETICCTPCT in the last 72 hours. Urine analysis:    Component Value Date/Time   COLORURINE COLORLESS (A) 12/28/2023 1238   APPEARANCEUR CLEAR 12/28/2023 1238   LABSPEC 1.022 12/28/2023 1238   PHURINE 8.0 12/28/2023 1238   GLUCOSEU >=500 (A) 12/28/2023 1238   GLUCOSEU NEGATIVE 11/06/2013 1702   HGBUR NEGATIVE 12/28/2023 1238   BILIRUBINUR NEGATIVE 12/28/2023 1238   BILIRUBINUR negative 06/24/2015 1812   KETONESUR NEGATIVE 12/28/2023 1238   PROTEINUR NEGATIVE 12/28/2023 1238   UROBILINOGEN 0.2 06/24/2015 1812   UROBILINOGEN 1.0 06/07/2015 0019   NITRITE NEGATIVE 12/28/2023 1238   LEUKOCYTESUR NEGATIVE 12/28/2023 1238   Sepsis Labs: @LABRCNTIP (procalcitonin:4,lacticidven:4) )No results found for this or any previous visit (from the past 240 hours).   Radiological Exams on Admission: MR BRAIN WO CONTRAST Result Date: 06/14/2024 CLINICAL DATA:  Initial evaluation for acute neuro deficit, stroke suspected. EXAM: MRI HEAD WITHOUT CONTRAST TECHNIQUE: Multiplanar, multiecho pulse sequences of the brain and surrounding structures were obtained without intravenous contrast. COMPARISON:  CT from 06/13/2024 and  MRI from 05/13/2024 FINDINGS: Brain: Cerebral volume within normal limits. Mild chronic microvascular ischemic disease noted involving the supratentorial cerebral white matter. 1.4 cm linear focus of diffusion signal abnormality seen involving the subcortical right frontal lobe (series 7, image 57). Associated T2/FLAIR signal without ADC correlate. This is new as compared to prior MRI, and is consistent with an evolving subacute small vessel type infarct. Additional 4 mm focus noted  slightly posteriorly and inferiorly (series 5, image 85). No other evidence for acute or subacute ischemia. Gray-white matter differentiation otherwise maintained. No areas of chronic cortical infarction. No acute intracranial hemorrhage. Single punctate chronic microhemorrhage noted within the left thalamus. No mass lesion, midline shift or mass effect. No hydrocephalus or extra-axial fluid collection. Prominent empty sella noted. The native pituitary gland is likely effaced at the right aspect of the sella (series 15, image 20). Vascular: Major intracranial vascular flow voids are maintained. Skull and upper cervical spine: Craniocervical junction within normal limits. Bone marrow signal intensity within normal limits. No scalp soft tissue abnormality. Sinuses/Orbits: Globes orbital soft tissues within normal limits. Scattered mucosal thickening noted about the frontal ethmoidal and maxillary sinuses. Small right with trace left mastoid effusions noted, of doubtful significance. Other: None. IMPRESSION: 1. Few small patchy foci of diffusion signal abnormality involving the subcortical right frontal lobe as above, consistent with evolving subacute small vessel type infarcts. No associated hemorrhage or mass effect. 2. No other acute intracranial abnormality. 3. Underlying mild chronic microvascular ischemic disease. Electronically Signed   By: Morene Hoard M.D.   On: 06/14/2024 02:27   CT HEAD CODE STROKE WO CONTRAST Result  Date: 06/13/2024 CLINICAL DATA:  Code stroke. Initial evaluation for acute neuro deficit. EXAM: CT HEAD WITHOUT CONTRAST TECHNIQUE: Contiguous axial images were obtained from the base of the skull through the vertex without intravenous contrast. RADIATION DOSE REDUCTION: This exam was performed according to the departmental dose-optimization program which includes automated exposure control, adjustment of the mA and/or kV according to patient size and/or use of iterative reconstruction technique. COMPARISON:  None Available. FINDINGS: Brain: Cerebral volume within normal limits. No acute intracranial hemorrhage. No acute large vessel territory infarct. No mass lesion or midline shift. No hydrocephalus or extra-axial fluid collection. Empty sella noted. Vascular: No abnormal hyperdense vessel. Skull: Scalp soft tissues within normal limits.  Calvarium intact. Sinuses/Orbits: Globes orbital soft tissues within normal limits. Scattered mucosal thickening present about the ethmoidal air cells and maxillary sinuses. Small right mastoid effusion noted. Other: None. ASPECTS [facility] Stroke Program Early CT Score) - Ganglionic level infarction (caudate, lentiform nuclei, internal capsule, insula, M1-M3 cortex): 7 - Supraganglionic infarction (M4-M6 cortex): 3 Total score (0-10 with 10 being normal): 10 IMPRESSION: 1. No acute intracranial abnormality. 2. ASPECTS is 10. 3. Empty sella. These results were communicated to Dr. Lindzen at 10:53 pm on 06/13/2024 by text page via the [facility] messaging system. Electronically Signed   By: Morene Hoard M.D.   On: 06/13/2024 22:54    EKG: Independently reviewed.  Normal sinus rhythm.  Assessment/Plan Principal Problem:   Acute CVA (cerebrovascular accident) [facility]) Active Problems:   Essential hypertension   Microcytic anemia   Headache   OSA (obstructive sleep apnea)   Hyperlipidemia associated with type 2 diabetes mellitus (HCC)   Diabetes mellitus type 2 in  nonobese (HCC)   Mixed hyperlipidemia   Cerebral aneurysm   CKD (chronic kidney disease) stage 3, GFR 30-59 ml/min (HCC)    Acute CVA -     appreciate neurology consult.  At this time neurology recommended good control of the blood pressure.  Will keep patient on neurocheck.  Patient passed stroke swallow.  Patient recently had cerebral angiogram which showed posterior communicating aneurysm and neurosurgery is planning intervention next week and was started on Brilinta .  Will continue with statins.  Check 2D echo.  Further recommendations per neurology. Hypertension initial blood pressure was elevated at site.  Presently well-controlled.  Will continue with patient's Cardizem  beta-blockers Imdur  ARB spironolactone  and hydralazine . Diabetes mellitus type 2 takes Tresiba  55 units at bedtime.  And insulin  concentrated in the morning.  Check hemoglobin A1c. Chronic anemia likely from renal disease follow CBC. Chronic kidney disease stage III creatinine around baseline.  Mild hypokalemia replace potassium. OSA uses mouthguard. Hyperlipidemia on statins. Cerebral aneurysm had cerebral angiogram done May 30, 2024 neurosurgery planning intervention next week. Reactive airway disease takes as needed albuterol  and Flovent .  Since patient has CVA will need close monitoring further workup and more than 2 midnight stay.   DVT prophylaxis: Lovenox . Code Status: Full code. Family Communication: Discussed with patient. Disposition Plan: Medical floor. Consults called: Neurology. Admission status: Observation.

## 2024-06-14 NOTE — Evaluation (Signed)
 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   Personal history of COVID-19 01/17/2020   Mixed hyperlipidemia 01/03/2020   Lower extremity edema 01/03/2020   COVID-19 virus detected 12/22/2019   Diabetes mellitus type 2 in nonobese [facility])    Hypoxia    Leg swelling 04/26/2019   Morbid obesity (HCC) 04/26/2019   Hyperlipidemia associated with type 2 diabetes mellitus (HCC) 10/25/2018   OSA (obstructive sleep apnea) 10/24/2018   AKI (acute kidney injury) (HCC) 07/25/2018   Elevated bilirubin    Gallstones    Iron  deficiency anemia 05/08/2018   H/O abdominal hysterectomy 05/08/2018   Fibroid 04/05/2018   Family history of early CAD 04/12/2017   Uncontrolled type 2 diabetes mellitus without complication, without long-term current use of insulin  04/12/2017   Lower respiratory infection 07/07/2015   Bacterial vaginosis 07/07/2015   Edema 03/26/2015   Anal itching 10/14/2014   Head or neck swelling, mass, or lump 09/25/2014   Vaginitis and vulvovaginitis 09/10/2014   Dizziness and giddiness 06/02/2014   Other malaise and fatigue 05/30/2014   Acute sinusitis with symptoms > 10 days 04/09/2014   Essential hypertension, malignant 02/04/2014   Shortness of breath 02/04/2014   Allergic rhinitis 01/29/2014   Hypokalemia 01/10/2014   Back pain 01/06/2014   Bilateral lower abdominal cramping 08/29/2013   Menorrhagia 08/29/2013   Encounter for routine gynecological examination 07/01/2013   Headache 03/31/2013   Essential hypertension 02/21/2013   Morbid obesity with BMI of  40.0-44.9, adult (HCC) 02/21/2013   Microcytic anemia 02/21/2013   Past Medical History:  Past Medical History:  Diagnosis Date   Anemia    Diabetes mellitus without complication (HCC)    Hypertension    Obesity    Past Surgical History:  Past Surgical History:  Procedure Laterality Date   ABDOMINAL HYSTERECTOMY     BREAST SURGERY  08/2011   breast reduction   CYSTOSCOPY  04/12/2018   Procedure: CYSTOSCOPY;  Surgeon: Arloa Lamar SQUIBB, MD;  Location: [facility] ORS;  Service: Gynecology;;   FOOT SURGERY Left    cyst removed   GALLBLADDER SURGERY  09/03/2018   IR ANGIO INTRA EXTRACRAN SEL INTERNAL CAROTID BILAT MOD SED  05/31/2024   IR ANGIO VERTEBRAL SEL VERTEBRAL BILAT MOD SED  05/31/2024   LAPAROSCOPIC HYSTERECTOMY Bilateral 04/12/2018   Procedure: HYSTERECTOMY TOTAL LAPAROSCOPIC BILATERAL SALPINGECTOMY;  Surgeon: Arloa Lamar SQUIBB, MD;  Location: [facility] ORS;  Service: Gynecology;  Laterality: Bilateral;   TUBAL LIGATION     HPI:  53yo female admitted 06/13/24 with severe occipital headache. PMH: HTN, DM2, HLD, CKD3, anemia, cerebral aneurysm (6mm PCA). MRI - right frontal subacute infarct   Assessment / Plan / Recommendation Clinical Impression  Pt presents with mild neurocognitive deficits. Speech is intelligible. No asymmetry with CN exam, however, pt exhibits slow laborious movements during assessment. She reports numbness of the right upper and lower extremities. Receptive and Expressive Language are intact. She is right handed. Extended processing time noted during this assessment. Pt reports increase in forgetfulness over the past 6 months. She indicates she has been unable to work recently, and her adult children have needed to  assist her with finances. She is independent with medications. She is not really driving at this time. 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 She lives alone, but her son and daughter and in and out frequently.   The [employer] Mental  Status (SLUMS) Examination was administered today. Pt scored 21/30, indicating mild neurocognitive deficits. Points were lost on mental math, thought organization, delayed recall, and auditory attention/recall. Given reported decline in status over the past several months, as well as performance today, home health or Outpatient speech therapy is recommended at discharge to maximize safety and independence. No acute needs at this time. Please reconsult if needs arise.    SLP Assessment  SLP Recommendation/Assessment: All further Speech Language Pathology needs can be addressed in the next venue of care SLP Visit Diagnosis: Cognitive communication deficit (R41.841)     Assistance Recommended at Discharge  Intermittent Supervision/Assistance  Functional Status Assessment Patient has had a recent decline in their functional status and demonstrates the ability to make significant improvements in function in a reasonable and predictable amount of time.     SLP Evaluation Cognition  Overall Cognitive Status: History of cognitive impairments - at baseline (per pt report) Arousal/Alertness: Awake/alert Orientation Level: Oriented X4 Year: 2025       Comprehension  Auditory Comprehension Overall Auditory Comprehension: Appears within functional limits for tasks assessed Interfering Components: Processing speed;Working Radio broadcast assistant: Extra processing time Reading Comprehension Reading Status: Not tested    Expression Expression Primary Mode of Expression: Verbal Verbal Expression Overall Verbal Expression: Appears within functional limits for tasks assessed   Oral / Motor  Oral Motor/Sensory Function Overall Oral Motor/Sensory Function: Within functional limits Motor Speech Overall Motor Speech: Appears within functional limits for tasks assessed Intelligibility: Intelligible           Kadarrius Yanke B. Dory, MSP, CCC-SLP Speech Language Pathologist Office: [phone]  [name] 06/14/2024, 12:21 PM

## 2024-06-14 NOTE — ED Notes (Signed)
Pt provided tooth brush and tooth paste

## 2024-06-14 NOTE — ED Provider Notes (Signed)
 Blood pressure (!) 147/99, pulse 82, temperature 98 F (36.7 C), temperature source Temporal, resp. rate 19, last menstrual period 04/03/2018, SpO2 100%.  Assuming care from Dr. MARLA. Horton.  In short, Grace Gallagher is a 53 y.o. female with a chief complaint of Code Stroke .  Refer to the original H&P for additional details.  The current plan of care is to follow up on MRI and Neurology recommendations.  MRI reviewed with Neurology. Plan for admit.   Discussed case with [facility], Dr. Franky. Plan for admit.    Darra Fonda MATSU, MD 06/14/24 [phone]

## 2024-06-14 NOTE — ED Notes (Signed)
 Pt ambulatory to restroom

## 2024-06-14 NOTE — ED Notes (Signed)
 Patient given 4oz apple juice and a pack of graham crackers for CBG 62. Pt A+Ox4, respirations even and unlabored.

## 2024-06-14 NOTE — ED Notes (Signed)
Patient provided lunch meal tray

## 2024-06-14 NOTE — Hospital Course (Addendum)
 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 CT head>> nad. MRI of the brain>right frontal subacute infarct-patient was admitted for further stroke workup. MRI showed subacute CVA, seen by neurology completed stroke workup LDL at goal TTE resulted and overall unremarkable. Discussed w/ neuro and ok for dc home She will continue on aspirin  and brilinta  and atorvastatin  80 mg  Subjective: Seen examined Overnight afebrile not hypoxic BP stable in 140s systolic Labs reviewed LDL 46 CBC with chronic anemia hemoglobin 9.1 platelet normal, BMP ok  Discharge diagnosis   Subacute CVA right frontal subacute infarct on MRI: Patient presented with severe headache occipital area and MRI abnormal recently had cerebral angiogram which showed posterior communicating aneurysm and neurosurgery is planning intervention next week and was started on Brilinta   cont same cont statins. LDL 46 at goal A1c pending TTE  ef in 60s, g1dd and no acute findings. Discussed with neurology. Plan is to continue Brilinta  and aspirin   Hypertension: initial blood pressure was elevated.Presently well-controlled.  She is on multiple meds at home reported episode of hypotension> advised to hold off on medication if BP low >120s at home and to d/w pcp. PTA on  Cardizem  beta-blockers Imdur  ARB spironolactone  and hydralazine .  Diabetes mellitus type 2: PTA on Tresiba  55 units at bedtime.  Well controlled on home regimen and sliding scale. Recent Labs  Lab 06/13/24 2235 06/14/24 0748 06/14/24 1216  GLUCAP 97 122* 157*     Chronic anemia likely from renal disease follow CBC.  Chronic kidney disease stage IIIa: creatinine around baseline.  Mild hypokalemia replace  potassium.  OSA: uses mouthguard.  Hyperlipidemia: cont statins.  Cerebral aneurysm: had cerebral angiogram done May 30, 2024 neurosurgery planning intervention next week.  Reactive airway disease: takes as needed albuterol  and Flovent .

## 2024-06-14 NOTE — Discharge Summary (Signed)
 Physician Discharge Summary  Grace Gallagher FMW:[id number] DOB: 28-Aug-1971 DOA: 06/13/2024  PCP: Allen Lauraine CROME, PA-C  Admit date: 06/13/2024 Discharge date: 06/14/2024 Recommendations for Outpatient Follow-up:  Follow up with PCP in 1 weeks-call for appointment Please obtain BMP/CBC in one week F/u w/ neurology  Discharge Dispo: Home Discharge Condition: Stable Code Status:   Code Status: Full Code Diet recommendation:  Diet Order             Diet Carb Modified Fluid consistency: Thin; Room service appropriate? 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 CT head>> nad. MRI of the brain>right frontal subacute infarct-patient was admitted for further stroke workup. MRI showed subacute CVA, seen by neurology completed stroke workup LDL at goal TTE resulted and overall unremarkable. Discussed w/ neuro and ok for dc home She will continue on aspirin  and brilinta  and atorvastatin  80 mg  Subjective: Seen examined Overnight afebrile not hypoxic BP stable in 140s systolic Labs reviewed LDL 46 CBC with chronic anemia hemoglobin 9.1 platelet normal, BMP ok  Discharge diagnosis   Subacute CVA right frontal subacute infarct on MRI: Patient presented with severe headache occipital area and MRI abnormal recently had cerebral angiogram which showed posterior communicating aneurysm and neurosurgery is planning intervention next week and was started on Brilinta   cont same cont statins. LDL 46 at goal A1c pending TTE  ef in 60s, g1dd and no acute findings. Discussed with neurology. Plan is to continue Brilinta  and aspirin   Hypertension: initial blood pressure was  elevated.Presently well-controlled.  She is on multiple meds at home reported episode of hypotension> advised to hold off on medication if BP low >120s at home and to d/w pcp. PTA on  Cardizem  beta-blockers Imdur  ARB spironolactone  and hydralazine .  Diabetes mellitus type 2: PTA on Tresiba  55 units at bedtime.  Well controlled on home regimen and sliding scale. Recent Labs  Lab 06/13/24 2235 06/14/24 0748 06/14/24 1216  GLUCAP 97 122* 157*     Chronic anemia likely from renal disease follow CBC.  Chronic kidney disease stage IIIa: creatinine around baseline.  Mild hypokalemia replace potassium.  OSA: uses mouthguard.  Hyperlipidemia: cont statins.  Cerebral aneurysm: had cerebral angiogram done May 30, 2024 neurosurgery planning intervention next week.  Reactive airway disease: takes as needed albuterol  and Flovent .  Discharge Exam: Vitals:   06/14/24 1408 06/14/24 1445  BP: (!) 155/91 (!) 149/88  Pulse: 74 78  Resp: 19 18  Temp:    SpO2: 99% 99%   General: Pt is alert, awake, not in acute distress Cardiovascular: RRR, S1/S2 +, no rubs, no gallops Respiratory: CTA bilaterally, no wheezing, no rhonchi Abdominal: Soft, NT, ND, bowel sounds + Extremities: no edema, no cyanosis  Discharge Instructions  Discharge Instructions     Ambulatory referral to Neurology   Complete by: As directed    An appointment is requested in approximately: 2 weeks   Discharge instructions   Complete by: As directed    Please call call MD or return to ER for similar or worsening recurring problem that brought you to hospital or if any fever,nausea/vomiting,abdominal pain,  uncontrolled pain, chest pain,  shortness of breath or any other alarming symptoms.  Please follow-up your doctor as instructed in a week time and call the office for appointment.  Please avoid alcohol, smoking, or any other illicit substance and maintain healthy habits including taking your regular medications as  prescribed.  You were cared for by a hospitalist during your hospital stay. If you have any questions about your discharge medications or the care you received while you were in the hospital after you are discharged, you can call the unit and ask to speak with the hospitalist on call if the hospitalist that took care of you is not available.  Once you are discharged, your primary care physician will handle any further medical issues. Please note that NO REFILLS for any discharge medications will be authorized once you are discharged, as it is imperative that you return to your primary care physician (or establish a relationship with a primary care physician if you do not have one) for your aftercare needs so that they can reassess your need for medications and monitor your lab values   Increase activity slowly   Complete by: As directed       Allergies as of 06/14/2024       Reactions   Duloxetine Other (See Comments)   Increased somnolence.    Egg-derived Products Rash   Haemophilus B Polysaccharide Vaccine Rash   Influenza Virus Vaccine Rash, Other (See Comments)   Develops a cold the day after receiving it- can tolerate the shot if it is received at a doctor's office (MUST be egg-free)   Atrovent  Nasal Spray [ipratropium] Other (See Comments)   Causes nose bleed   Hydrocodone  Nausea Only   Metformin And Related Diarrhea        Medication List     TAKE these medications    acetaminophen  500 MG tablet Commonly known as: TYLENOL  Take 1,000 mg by mouth every 6 (six) hours as needed for mild pain (pain score 1-3) or headache.   albuterol  108 (90 Base) MCG/ACT inhaler Commonly known as: VENTOLIN  HFA Inhale 2 puffs into the lungs every 6 (six) hours as needed for wheezing or shortness of breath.   albuterol  1.25 MG/3ML nebulizer solution Commonly known as: ACCUNEB  Take 1 ampule by nebulization every 6 (six) hours as needed for wheezing or shortness of breath.   Ashwagandha 300  MG Tabs Take 300 mg by mouth daily.   atorvastatin  80 MG tablet Commonly known as: LIPITOR Take 80 mg by mouth daily.   Bayer Aspirin  325 MG tablet Generic drug: aspirin  EC Take 325 mg by mouth daily.   aspirin  81 MG chewable tablet Chew 81 mg by mouth daily.   carvedilol  25 MG tablet Commonly known as: COREG  Take 1 tablet (25 mg total) by mouth 2 (two) times daily.   diltiazem  360 MG 24 hr capsule Commonly known as: CARDIZEM  CD Take 360 mg by mouth at bedtime.   fluticasone  220 MCG/ACT inhaler Commonly known as: FLOVENT  HFA Inhale 2 puffs into the lungs 2 (two) times daily.   fluticasone  50 MCG/ACT nasal spray Commonly known as: FLONASE  Place 2 sprays into both nostrils daily.   HumuLIN R  U-500 KwikPen 500 UNIT/ML KwikPen Generic drug: insulin  regular human CONCENTRATED Inject 60 Units into the skin daily with breakfast.   hydrALAZINE  25 MG tablet Commonly known as: APRESOLINE  Take 25 mg by mouth 3 (three) times daily. Take two tablets by mouth three time daily.   insulin  degludec 200  UNIT/ML FlexTouch Pen Commonly known as: TRESIBA  Inject 56 Units into the skin at bedtime. Inject 56 units subcutaneously at bedtime   isosorbide  mononitrate 30 MG 24 hr tablet Commonly known as: IMDUR  Take 30 mg by mouth daily.   ketoconazole 2 % cream Commonly known as: NIZORAL Apply 1 Application topically daily. Apply a thin layer to face daily.   omeprazole 20 MG capsule Commonly known as: PRILOSEC Take 20 mg by mouth daily.   Omnipod 5 DexG7G6 Intro Gen 5 Kit Inject into the skin.   Ozempic (2 MG/DOSE) 8 MG/3ML Sopn Generic drug: Semaglutide (2 MG/DOSE) Inject 2 mg into the skin once a week. Inject 2mg  subcutaneously once per week on Friday.   spironolactone  50 MG tablet Commonly known as: ALDACTONE  Take 50 mg by mouth 2 (two) times daily.   telmisartan 40 MG tablet Commonly known as: MICARDIS Take 40 mg by mouth 2 (two) times daily.   ticagrelor  90 MG Tabs  tablet Commonly known as: BRILINTA  Take 90 mg by mouth 2 (two) times daily.   traMADol  50 MG tablet Commonly known as: ULTRAM  Take 50 mg by mouth every 6 (six) hours as needed (for pain).   Vitamin D3 1000 units Caps Take 1,000 Units by mouth daily.   zolpidem  5 MG tablet Commonly known as: AMBIEN  Take 5 mg by mouth at bedtime as needed for sleep.        Allergies  Allergen Reactions   Duloxetine Other (See Comments)    Increased somnolence.    Egg-Derived Products Rash   Haemophilus B Polysaccharide Vaccine Rash   Influenza Virus Vaccine Rash and Other (See Comments)    Develops a cold the day after receiving it- can tolerate the shot if it is received at a doctor's office (MUST be egg-free)   Atrovent  Nasal Spray [Ipratropium] Other (See Comments)    Causes nose bleed   Hydrocodone  Nausea Only   Metformin And Related Diarrhea    The results of significant diagnostics from this hospitalization (including imaging, microbiology, ancillary and laboratory) are listed below for reference.    Microbiology: No results found for this or any previous visit (from the past 240 hours).  Procedures/Studies: ECHOCARDIOGRAM COMPLETE Result Date: 06/14/2024    ECHOCARDIOGRAM REPORT   Patient Name:   ARMINA GALLOWAY Tegtmeyer Date of Exam: 06/14/2024 Medical Rec #:  [id number]      Height:       66.0 in Accession #:    [id number]     Weight:       276.0 lb Date of Birth:  1970-12-11      BSA:          2.293 m Patient Age:    52 years       BP:           149/88 mmHg Patient Gender: F              HR:           78 bpm. Exam Location:  Inpatient Procedure: 2D Echo and Color Doppler (Both Spectral and Color Flow Doppler were            utilized during procedure). Indications:    CVA  History:        Patient has prior history of Echocardiogram examinations.                 Stroke.  Sonographer:    Jayson Gaskins Referring Phys: [phone] ARSHAD N KAKRAKANDY IMPRESSIONS  1. Left  ventricular ejection fraction, by  estimation, is 60 to 65%. The left ventricle has normal function. The left ventricle has no regional wall motion abnormalities. There is moderate asymmetric left ventricular hypertrophy of the basal-septal segment. Left ventricular diastolic parameters are consistent with Grade I diastolic dysfunction (impaired relaxation).  2. Right ventricular systolic function is normal. The right ventricular size is normal. Tricuspid regurgitation signal is inadequate for assessing PA pressure.  3. The mitral valve is normal in structure. Trivial mitral valve regurgitation. No evidence of mitral stenosis.  4. The aortic valve was not well visualized. Aortic valve regurgitation is not visualized. No aortic stenosis is present.  5. The inferior vena cava is normal in size with greater than 50% respiratory variability, suggesting right atrial pressure of 3 mmHg. FINDINGS  Left Ventricle: Left ventricular ejection fraction, by estimation, is 60 to 65%. The left ventricle has normal function. The left ventricle has no regional wall motion abnormalities. The left ventricular internal cavity size was normal in size. There is  moderate asymmetric left ventricular hypertrophy of the basal-septal segment. Left ventricular diastolic parameters are consistent with Grade I diastolic dysfunction (impaired relaxation). Right Ventricle: The right ventricular size is normal. No increase in right ventricular wall thickness. Right ventricular systolic function is normal. Tricuspid regurgitation signal is inadequate for assessing PA pressure. Left Atrium: Left atrial size was normal in size. Right Atrium: Right atrial size was normal in size. Pericardium: There is no evidence of pericardial effusion. Mitral Valve: The mitral valve is normal in structure. Trivial mitral valve regurgitation. No evidence of mitral valve stenosis. Tricuspid Valve: The tricuspid valve is normal in structure. Tricuspid valve regurgitation is trivial. Aortic Valve: The  aortic valve was not well visualized. Aortic valve regurgitation is not visualized. No aortic stenosis is present. Aortic valve mean gradient measures 5.0 mmHg. Aortic valve peak gradient measures 6.2 mmHg. Aortic valve area, by VTI measures 3.09 cm. Pulmonic Valve: The pulmonic valve was not well visualized. Pulmonic valve regurgitation is trivial. Aorta: The aortic root is normal in size and structure. Venous: The inferior vena cava is normal in size with greater than 50% respiratory variability, suggesting right atrial pressure of 3 mmHg. IAS/Shunts: The interatrial septum was not well visualized.  LEFT VENTRICLE PLAX 2D LVIDd:         4.70 cm   Diastology LVIDs:         3.20 cm   LV e' medial:    7.62 cm/s LV PW:         1.10 cm   LV E/e' medial:  10.9 LV IVS:        0.90 cm   LV e' lateral:   5.44 cm/s LVOT diam:     2.00 cm   LV E/e' lateral: 15.3 LV SV:         74 LV SV Index:   32 LVOT Area:     3.14 cm  RIGHT VENTRICLE RV S prime:     15.80 cm/s TAPSE (M-mode): 2.1 cm LEFT ATRIUM             Index        RIGHT ATRIUM           Index LA Vol (A2C):   43.6 ml 19.01 ml/m  RA Area:     11.70 cm LA Vol (A4C):   52.5 ml 22.89 ml/m  RA Volume:   24.60 ml  10.73 ml/m LA Biplane Vol: 49.5 ml 21.59 ml/m  AORTIC VALVE AV  Area (Vmax):    2.84 cm AV Area (Vmean):   2.44 cm AV Area (VTI):     3.09 cm AV Vmax:           125.00 cm/s AV Vmean:          105.000 cm/s AV VTI:            0.238 m AV Peak Grad:      6.2 mmHg AV Mean Grad:      5.0 mmHg LVOT Vmax:         113.00 cm/s LVOT Vmean:        81.400 cm/s LVOT VTI:          0.234 m LVOT/AV VTI ratio: 0.98  AORTA Ao Root diam: 3.40 cm MITRAL VALVE MV Area (PHT): 3.34 cm    SHUNTS MV Decel Time: 227 msec    Systemic VTI:  0.23 m MV E velocity: 83.30 cm/s  Systemic Diam: 2.00 cm MV A velocity: 91.20 cm/s MV E/A ratio:  0.91 Lonni Nanas MD Electronically signed by Lonni Nanas MD Signature Date/Time: 06/14/2024/4:40:16 PM    Final    MR BRAIN WO  CONTRAST Result Date: 06/14/2024 CLINICAL DATA:  Initial evaluation for acute neuro deficit, stroke suspected. EXAM: MRI HEAD WITHOUT CONTRAST TECHNIQUE: Multiplanar, multiecho pulse sequences of the brain and surrounding structures were obtained without intravenous contrast. COMPARISON:  CT from 06/13/2024 and MRI from 05/13/2024 FINDINGS: Brain: Cerebral volume within normal limits. Mild chronic microvascular ischemic disease noted involving the supratentorial cerebral white matter. 1.4 cm linear focus of diffusion signal abnormality seen involving the subcortical right frontal lobe (series 7, image 57). Associated T2/FLAIR signal without ADC correlate. This is new as compared to prior MRI, and is consistent with an evolving subacute small vessel type infarct. Additional 4 mm focus noted slightly posteriorly and inferiorly (series 5, image 85). No other evidence for acute or subacute ischemia. Gray-white matter differentiation otherwise maintained. No areas of chronic cortical infarction. No acute intracranial hemorrhage. Single punctate chronic microhemorrhage noted within the left thalamus. No mass lesion, midline shift or mass effect. No hydrocephalus or extra-axial fluid collection. Prominent empty sella noted. The native pituitary gland is likely effaced at the right aspect of the sella (series 15, image 20). Vascular: Major intracranial vascular flow voids are maintained. Skull and upper cervical spine: Craniocervical junction within normal limits. Bone marrow signal intensity within normal limits. No scalp soft tissue abnormality. Sinuses/Orbits: Globes orbital soft tissues within normal limits. Scattered mucosal thickening noted about the frontal ethmoidal and maxillary sinuses. Small right with trace left mastoid effusions noted, of doubtful significance. Other: None. IMPRESSION: 1. Few small patchy foci of diffusion signal abnormality involving the subcortical right frontal lobe as above, consistent  with evolving subacute small vessel type infarcts. No associated hemorrhage or mass effect. 2. No other acute intracranial abnormality. 3. Underlying mild chronic microvascular ischemic disease. Electronically Signed   By: Morene Hoard M.D.   On: 06/14/2024 02:27   CT HEAD CODE STROKE WO CONTRAST Result Date: 06/13/2024 CLINICAL DATA:  Code stroke. Initial evaluation for acute neuro deficit. EXAM: CT HEAD WITHOUT CONTRAST TECHNIQUE: Contiguous axial images were obtained from the base of the skull through the vertex without intravenous contrast. RADIATION DOSE REDUCTION: This exam was performed according to the departmental dose-optimization program which includes automated exposure control, adjustment of the mA and/or kV according to patient size and/or use of iterative reconstruction technique. COMPARISON:  None Available. FINDINGS: Brain: Cerebral volume within normal  limits. No acute intracranial hemorrhage. No acute large vessel territory infarct. No mass lesion or midline shift. No hydrocephalus or extra-axial fluid collection. Empty sella noted. Vascular: No abnormal hyperdense vessel. Skull: Scalp soft tissues within normal limits.  Calvarium intact. Sinuses/Orbits: Globes orbital soft tissues within normal limits. Scattered mucosal thickening present about the ethmoidal air cells and maxillary sinuses. Small right mastoid effusion noted. Other: None. ASPECTS [facility] Stroke Program Early CT Score) - Ganglionic level infarction (caudate, lentiform nuclei, internal capsule, insula, M1-M3 cortex): 7 - Supraganglionic infarction (M4-M6 cortex): 3 Total score (0-10 with 10 being normal): 10 IMPRESSION: 1. No acute intracranial abnormality. 2. ASPECTS is 10. 3. Empty sella. These results were communicated to Dr. Lindzen at 10:53 pm on 06/13/2024 by text page via the [facility] messaging system. Electronically Signed   By: Morene Hoard M.D.   On: 06/13/2024 22:54   IR ANGIO INTRA EXTRACRAN SEL  INTERNAL CAROTID BILAT MOD SED PROCEDURE: Diagnostic Cerebral Angiogram  SURGEON:  Dr. Gerldine Maizes, MD  HISTORY:  The patient is a 53 y.o. yo female initially seen in the outpatient neurosurgery clinic after being referred from the emergency department where she presented with hypertensive urgency.  Her workup included CT angiogram which revealed an incidental 6 mm right posterior communicating artery aneurysm.  The patient therefore presents today for further workup with diagnostic cerebral angiogram.  APPROACH:  The technical aspects of the procedure as well as its potential risks and benefits were reviewed with the patient. These risks included but were not limited bleeding, infection, allergic reaction, damage to organs/vital structures, stroke, non-diagnostic procedure, and the catastrophic outcomes of heart attack, coma, and death. With an understanding of these risks, informed consent was obtained and witnessed.   The patient was placed in the supine position on the angiography table and the skin of right groin prepped in the usual sterile fashion. The procedure was performed under local anesthesia (1%-solution of bicarbonate-bufferred Lidoacaine) and conscious sedation with Versed  and fentanyl  monitored by the in-suite nurse and myself, including non-invasive blood pressure and continuous pulse oxymetry.   Access to the right common femoral artery was obtained utilizing a micropuncture needle and standard Seldinger technique.  A short 5 French sheath was placed.  HEPARIN : 2000 Units total.   CONTRAST AGENT: See IR records  FLUOROSCOPY TIME: See IR records   CATHETER(S) AND WIRE(S):   5-French JB-1 glidecatheter  0.035 glidewire   VESSELS CATHETERIZED:  Right internal carotid  Left internal carotid  Left vertebral  Right common femoral  VESSELS STUDIED:  Right internal carotid, head Left internal carotid, head Left vertebral Right femoral  PROCEDURAL NARRATIVE:  A 5-Fr JB-1 terumo glide catheter was  advanced over a 0.035 glidewire into the aortic arch. The above vessels were then sequentially catheterized and cervical/cerebral angiograms taken. After review of images, the catheter was removed without incident.   INTERPRETATION:  Right internal carotid, head:  Injection reveals the presence of a widely patent ICA, M1, and A1 segments and their branches.  There is a relatively wide neck posteriorly directed aneurysm of the posterior communicating artery with a small daughter sac projecting posteriorly.  Overall dimensions are approximately 6 x 6 mm.  The parenchymal and venous phases are unremarkable. The venous sinuses are widely patent.   Left internal carotid, head:  Injection reveals the presence of a widely patent ICA, A1, and M1 segments and their branches. No aneurysms, arteriovenous malformations, or high flow fistulas are visualized.  The parenchymal and venous phases are  unremarkable. The venous sinuses are widely patent.   Left vertebral:  Injection reveals the presence of a widely patent vertebral artery. This leads to a widely patent basilar artery that terminates in bilateral P1. The basilar apex is normal. No aneurysms, arteriovenous malformations, or high flow fistulas are visualized. The parenchymal and venous phases are normal. The venous sinuses are patent.   Right femoral:   Normal vessel. No significant atherosclerotic disease. Arterial sheath in adequate position.  DISPOSITION: Upon completion of the study, the femoral sheath was removed and hemostasis obtained using a 5-Fr Exoseal closure device. Good proximal and distal lower extremity pulses were documented upon achievement of hemostasis. The procedure was well tolerated and no early complications were observed.  The patient was transferred to the recovery area to be positioned flat in bed for 3 hours.   IMPRESSION: 1.  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   Gerldine Maizes,  MD [facility]  Electronically signed by Maizes Gerldine, MD at 05/31/2024  1:01 PM   IR ANGIO VERTEBRAL SEL VERTEBRAL BILAT MOD SED PROCEDURE: Diagnostic Cerebral Angiogram  SURGEON:  Dr. Gerldine Maizes, MD  HISTORY:  The patient is a 53 y.o. yo female initially seen in the outpatient neurosurgery clinic after being referred from the emergency department where she presented with hypertensive urgency.  Her workup included CT angiogram which revealed an incidental 6 mm right posterior communicating artery aneurysm.  The patient therefore presents today for further workup with diagnostic cerebral angiogram.  APPROACH:  The technical aspects of the procedure as well as its potential risks and benefits were reviewed with the patient. These risks included but were not limited bleeding, infection, allergic reaction, damage to organs/vital structures, stroke, non-diagnostic procedure, and the catastrophic outcomes of heart attack, coma, and death. With an understanding of these risks, informed consent was obtained and witnessed.   The patient was placed in the supine position on the angiography table and the skin of right groin prepped in the usual sterile fashion. The procedure was performed under local anesthesia (1%-solution of bicarbonate-bufferred Lidoacaine) and conscious sedation with Versed  and fentanyl  monitored by the in-suite nurse and myself, including non-invasive blood pressure and continuous pulse oxymetry.   Access to the right common femoral artery was obtained utilizing a micropuncture needle and standard Seldinger technique.  A short 5 French sheath was placed.  HEPARIN : 2000 Units total.   CONTRAST AGENT: See IR records  FLUOROSCOPY TIME: See IR records   CATHETER(S) AND WIRE(S):   5-French JB-1 glidecatheter  0.035 glidewire   VESSELS CATHETERIZED:  Right internal carotid  Left internal carotid  Left vertebral  Right common femoral  VESSELS STUDIED:  Right  internal carotid, head Left internal carotid, head Left vertebral Right femoral  PROCEDURAL NARRATIVE:  A 5-Fr JB-1 terumo glide catheter was advanced over a 0.035 glidewire into the aortic arch. The above vessels were then sequentially catheterized and cervical/cerebral angiograms taken. After review of images, the catheter was removed without incident.   INTERPRETATION:  Right internal carotid, head:  Injection reveals the presence of a widely patent ICA, M1, and A1 segments and their branches.  There is a relatively wide neck posteriorly directed aneurysm of the posterior communicating artery with a small daughter sac projecting posteriorly.  Overall dimensions are approximately 6 x 6 mm.  The parenchymal and venous phases are unremarkable. The venous sinuses are widely patent.   Left internal carotid, head:  Injection  reveals the presence of a widely patent ICA, A1, and M1 segments and their branches. No aneurysms, arteriovenous malformations, or high flow fistulas are visualized.  The parenchymal and venous phases are unremarkable. The venous sinuses are widely patent.   Left vertebral:  Injection reveals the presence of a widely patent vertebral artery. This leads to a widely patent basilar artery that terminates in bilateral P1. The basilar apex is normal. No aneurysms, arteriovenous malformations, or high flow fistulas are visualized. The parenchymal and venous phases are normal. The venous sinuses are patent.   Right femoral:   Normal vessel. No significant atherosclerotic disease. Arterial sheath in adequate position.  DISPOSITION: Upon completion of the study, the femoral sheath was removed and hemostasis obtained using a 5-Fr Exoseal closure device. Good proximal and distal lower extremity pulses were documented upon achievement of hemostasis. The procedure was well tolerated and no early complications were observed.  The patient was transferred to the recovery area to be positioned flat in bed for 3  hours.   IMPRESSION: 1.  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   Gerldine Maizes, MD [facility]  Electronically signed by Maizes Gerldine, MD at 05/31/2024  1:01 PM    Labs: BNP (last 3 results) Recent Labs    07/22/23 2210  BNP 18.1   Basic Metabolic Panel: Recent Labs  Lab 06/13/24 2241 06/13/24 2245 06/14/24 0646  NA 142 140 139  K 3.5 3.4* 3.5  CL 107 106 107  CO2  --  24 23  GLUCOSE 89 92 120*  BUN 20 18 17   CREATININE 1.20* 1.08* 0.99  CALCIUM   --  9.6 9.0   Liver Function Tests: Recent Labs  Lab 06/13/24 2245 06/14/24 0646  AST 23 19  ALT 21 19  ALKPHOS 78 66  BILITOT 1.0 0.8  PROT 6.8 5.9*  ALBUMIN 3.8 3.3*   No results for input(s): LIPASE, AMYLASE in the last 168 hours. No results for input(s): AMMONIA in the last 168 hours. CBC: Recent Labs  Lab 06/13/24 2241 06/13/24 2245 06/14/24 0646  WBC  --  6.4 5.5  NEUTROABS  --  3.3  --   HGB 11.6* 10.3* 9.1*  HCT 34.0* 33.8* 29.4*  MCV  --  75.6* 75.0*  PLT  --  211 173   CBG: Recent Labs  Lab 06/13/24 2235 06/14/24 0748 06/14/24 1216  GLUCAP 97 122* 157*  Lipid Profile Recent Labs    06/14/24 0649  CHOL 101  HDL 38*  LDLCALC 46  TRIG 87  CHOLHDL 2.7   Thyroid  function studies No results for input(s): TSH, T4TOTAL, T3FREE, THYROIDAB in the last 72 hours.  Invalid input(s): FREET3 Urinalysis    Component Value Date/Time   COLORURINE COLORLESS (A) 12/28/2023 1238   APPEARANCEUR CLEAR 12/28/2023 1238   LABSPEC 1.022 12/28/2023 1238   PHURINE 8.0 12/28/2023 1238   GLUCOSEU >=500 (A) 12/28/2023 1238   GLUCOSEU NEGATIVE 11/06/2013 1702   HGBUR NEGATIVE 12/28/2023 1238   BILIRUBINUR NEGATIVE 12/28/2023 1238   BILIRUBINUR negative 06/24/2015 1812   KETONESUR NEGATIVE 12/28/2023 1238   PROTEINUR NEGATIVE 12/28/2023 1238   UROBILINOGEN 0.2 06/24/2015 1812   UROBILINOGEN 1.0  06/07/2015 0019   NITRITE NEGATIVE 12/28/2023 1238   LEUKOCYTESUR NEGATIVE 12/28/2023 1238   Sepsis Labs Recent Labs  Lab 06/13/24 2245 06/14/24 0646  WBC 6.4 5.5   Microbiology No results found for this or any previous visit (from the past  240 hours).   Time coordinating discharge: 25 minutes  SIGNED: Mennie LAMY, MD  [facility] 06/14/2024, 4:44 PM  If 7PM-7AM, please contact night-coverage [email or web address]

## 2024-06-14 NOTE — ED Notes (Signed)
 Another 4 oz apple juice given for CBG 61.

## 2024-06-14 NOTE — Evaluation (Addendum)
 Physical Therapy Evaluation Patient Details Name: Grace Gallagher MRN: [id number] DOB: Jan 02, 1971 Today's Date: 06/14/2024  History of Present Illness  Falesha Schommer Boakye is a 53 y.o. female who presented with complaints of severe headache in the occipital area. SBP was more than 220 on EMS arrival.  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 PTA, pt was ModI with SP cane with history of at least 2 falls in the past month. Pt reported having increased difficulty ambulating and needs assist to stand after falling. Pt presents with R>L LE weakness (chronic), decreased activity tolerance, impaired balance and mobility. In today's session, pt needed MinA for bed mobility and CGA/MinA for sit<>stand transfers. Pt was able to ambulate 16ft with CGA and RW. Pt noted preference for RW with improved stability and comfort. Pt has 24/7 level of assist available at home. Recommending post-acute OP PT to work towards independence with mobility and prevent future falls. Acute PT to follow.  BP 145/80, 78 BPM         If plan is discharge home, recommend the following: A little help with walking and/or transfers;Assist for transportation;Help with stairs or ramp for entrance;A little help with bathing/dressing/bathroom   Can travel by private vehicle    Yes    Equipment Recommendations Rolling walker (2 wheels)     Functional Status Assessment Patient has had a recent decline in their functional status and demonstrates the ability to make significant improvements in function in a reasonable and predictable amount of time.     Precautions / Restrictions Precautions Precautions: Fall Restrictions Weight Bearing Restrictions Per Provider  Order: No      Mobility  Bed Mobility Overal bed mobility: Needs Assistance Bed Mobility: Supine to Sit    Supine to sit: Min assist        Transfers Overall transfer level: Needs assistance Equipment used: Rolling walker (2 wheels) Transfers: Sit to/from Stand Sit to Stand: Min assist      General transfer comment: CGA for elevated surface, Min A for lower surfaces (ex. toilet)    Ambulation/Gait Ambulation/Gait assistance: Contact guard assist Gait Distance (Feet): 120 Feet Assistive device: Rolling walker (2 wheels) Gait Pattern/deviations: Step-through pattern, Decreased stride length, Trunk flexed, Decreased stance time - right Gait velocity: decr     General Gait Details: R LE externally rotated with decreased WB and step-length. Cues for upright posture and to relax shoulders    Modified Rankin (Stroke Patients Only) Modified Rankin (Stroke Patients Only) Pre-Morbid Rankin Score: No significant disability Modified Rankin: Moderately severe disability     Balance Overall balance assessment: Needs assistance Sitting-balance support: Feet supported Sitting balance-Leahy Scale: Good     Standing balance support: Single extremity supported, During functional activity Standing balance-Leahy Scale: Fair Standing balance comment: statically at the sink          Pertinent Vitals/Pain Pain Assessment Pain Assessment: Faces Faces Pain Scale: Hurts even more Pain Location: headache Pain Descriptors / Indicators: Headache Pain Intervention(s): Limited activity within patient's tolerance, Monitored during session, Repositioned    Home Living Family/patient expects to be discharged to:: Private residence Living Arrangements: Children Available Help at Discharge: Family;Available 24 hours/day Type of Home: House Home Access: Level entry       Home Layout: One level  Home Equipment: Cane - single point      Prior Function Prior Level of Function :  Independent/Modified Independent      Mobility Comments: SPC for all mobility, frequent falls, 2 in the last month but several prior to those ADLs Comments: mod I for ADLs, children assist with IADLs as needed     Extremity/Trunk Assessment   Upper Extremity Assessment Upper Extremity Assessment: Defer to OT evaluation    Lower Extremity Assessment Lower Extremity Assessment: RLE deficits/detail;LLE deficits/detail RLE Deficits / Details: Hip flexion 3+/5, knee ext 4/5, ankle DF 4/5 RLE Sensation: WNL (reports N/T in toes at baseline) LLE Deficits / Details: Hip flexion 4+/5, Knee ext 4+/5, ankle DF 4+/5. LLE Sensation:  (reported numbness in toes upon standing, alert to light touch)    Cervical / Trunk Assessment Cervical / Trunk Assessment: Normal  Communication   Communication Communication: No apparent difficulties    Cognition Arousal: Alert Behavior During Therapy: Flat affect   PT - Cognitive impairments: No apparent impairments    Following commands: Intact       Cueing Cueing Techniques: Verbal cues     General Comments General comments (skin integrity, edema, etc.): VSS on RA     PT Assessment Patient needs continued PT services  PT Problem List Decreased strength;Decreased activity tolerance;Decreased balance;Decreased mobility       PT Treatment Interventions DME instruction;Gait training;Functional mobility training;Therapeutic activities;Therapeutic exercise;Balance training;Neuromuscular re-education;Patient/family education    PT Goals (Current goals can be found in the Care Plan section)  Acute Rehab PT Goals Patient Stated Goal: to have less falls PT Goal Formulation: With patient Time For Goal Achievement: 06/28/24 Potential to Achieve Goals: Good    Frequency Min 2X/week     Co-evaluation   Reason for Co-Treatment: Complexity of the patient's impairments (multi-system involvement);For patient/therapist safety;To address  functional/ADL transfers PT goals addressed during session: Mobility/safety with mobility;Balance;Proper use of DME OT goals addressed during session: ADL's and self-care       AM-PAC PT 6 Clicks Mobility  Outcome Measure Help needed turning from your back to your side while in a flat bed without using bedrails?: None Help needed moving from lying on your back to sitting on the side of a flat bed without using bedrails?: A Little Help needed moving to and from a bed to a chair (including a wheelchair)?: A Little Help needed standing up from a chair using your arms (e.g., wheelchair or bedside chair)?: A Little Help needed to walk in hospital room?: A Little Help needed climbing 3-5 steps with a railing? : A Lot 6 Click Score: 18    End of Session Equipment Utilized During Treatment: Gait belt Activity Tolerance: Patient tolerated treatment well Patient left: in bed;with call bell/phone within reach Nurse Communication: Mobility status PT Visit Diagnosis: Unsteadiness on feet (R26.81);Other abnormalities of gait and mobility (R26.89);History of falling (Z91.81);Muscle weakness (generalized) (M62.81)    Time: 8985-8956 PT Time Calculation (min) (ACUTE ONLY): 29 min   Charges:   PT Evaluation $PT Eval Low Complexity: 1 Low   PT General Charges $$ ACUTE PT VISIT: 1 Visit       Kate ORN, PT, DPT Secure Chat Preferred  Rehab Office [phone]   Kate BRAVO Wendolyn 06/14/2024, 11:41 AM

## 2024-06-18 ENCOUNTER — Other Ambulatory Visit: Payer: Self-pay

## 2024-06-18 ENCOUNTER — Encounter (HOSPITAL_COMMUNITY): Payer: Self-pay | Admitting: Neurosurgery

## 2024-06-18 NOTE — Progress Notes (Signed)
 PCP - Lauraine Patient, PA-C Cardiologist - Dr. Youlanda Kasal Pt states she used to see Dr. Donnice Lipps for endocrinology but that's been since 2021, now she says her PCP manages her DM.   PPM/ICD - denies   Chest x-ray - 05/13/24 EKG - 06/13/24 Stress Test - denies ECHO - 06/14/24 Cardiac Cath - denies  CPAP - OSA+, denies CPAP, uses mouthguard  Fasting Blood Sugar - 200-300 Checks Blood Sugar three times/day  Pt states she was instructed by surgeon to take last dose of ASA and Brilinta  on 7/16.  ERAS Protcol - clears until 1145  COVID TEST- n/a  Anesthesia review: yes, recent hospitalization and cardiac hx  Patient verbally denies any  fever, cough and chest pain during phone call + SOB     Questions were answered. Patient verbalized understanding of instructions.

## 2024-06-18 NOTE — Pre-Procedure Instructions (Signed)
 -------------  SDW INSTRUCTIONS given:  Your procedure is scheduled on 7/17.  Report to [facility] Entrance A at 12:15 P.M., and check in at the Admitting office.  Any questions or running late day of surgery: call [phone]    Remember:  Do not eat after midnight the night before your surgery  You may drink clear liquids until 11:45 AM the morning of your surgery.   Clear liquids allowed are: Water, Non-Citrus Juices (without pulp), Carbonated Beverages, Clear Tea, Black Coffee Only, and Gatorade    Take these medicines the morning of surgery with A SIP OF WATER  atorvastatin  (LIPITOR)  carvedilol  (COREG )  fluticasone  (FLONASE )  fluticasone  (FLOVENT  HFA)  hydrALAZINE  (APRESOLINE )  isosorbide  mononitrate (IMDUR )  omeprazole (PRILOSEC)    May take these medicines IF NEEDED: acetaminophen  (TYLENOL )  albuterol  (VENTOLIN  HFA) inhaler and nebulizer- bring inhaler with you traMADol  (ULTRAM )    Follow your surgeon's instructions on Aspirin  and Brilinta   As of today, STOP taking any Aleve, Naproxen, Ibuprofen , Motrin , Advil , Goody's, BC's, all herbal medications, fish oil, and all vitamins.  WHAT DO I DO ABOUT MY DIABETES MEDICATION?   STOP taking Ozempic 7 days prior to surgery.  Contact Endocrinologist for instructions on  HUMULIN R  U-500 KWIKPEN. If no instructions given, do not take HUMULIN R  U-500 KWIKPEN morning of surgery.      THE NIGHT BEFORE SURGERY, take 28 units (50%) of insulin  degludec (TRESIBA ).   HOW TO MANAGE YOUR DIABETES BEFORE AND AFTER SURGERY  Why is it important to control my blood sugar before and after surgery? Improving blood sugar levels before and after surgery helps healing and can limit problems. A way of improving blood sugar control is eating a healthy diet by:  Eating less sugar and carbohydrates  Increasing activity/exercise  Talking with your doctor about reaching your blood sugar goals High blood sugars (greater than 180 mg/dL)  can raise your risk of infections and slow your recovery, so you will need to focus on controlling your diabetes during the weeks before surgery. Make sure that the doctor who takes care of your diabetes knows about your planned surgery including the date and location.  How do I manage my blood sugar before surgery? Check your blood sugar at least 4 times a day, starting 2 days before surgery, to make sure that the level is not too high or low.  Check your blood sugar the morning of your surgery when you wake up and every 2 hours until you get to the Short Stay unit.  If your blood sugar is less than 70 mg/dL, you will need to treat for low blood sugar: Do not take insulin . Treat a low blood sugar (less than 70 mg/dL) with  cup of clear juice (cranberry or apple), 4 glucose tablets, OR glucose gel. Recheck blood sugar in 15 minutes after treatment (to make sure it is greater than 70 mg/dL). If your blood sugar is not greater than 70 mg/dL on recheck, call [phone] for further instructions. Report your blood sugar to the short stay nurse when you get to Short Stay.  If you are admitted to the hospital after surgery: Your blood sugar will be checked by the staff and you will probably be given insulin  after surgery (instead of oral diabetes medicines) to make sure you have good blood sugar levels. The goal for blood sugar control after surgery is 80-180 mg/dL.    Do NOT Smoke (Tobacco/Vaping) 24 hours prior to your procedure  If you  use a CPAP at night, you may bring all equipment for your overnight stay.     You will be asked to remove any contacts, glasses, piercing's, hearing aid's, dentures/partials prior to surgery. Please bring cases for these items if needed.     Patients discharged the day of surgery will not be allowed to drive home, and someone needs to stay with them for 24 hours.  SURGICAL WAITING ROOM VISITATION Patients may have no more than 2 support people in the  waiting area - these visitors may rotate.   Pre-op nurse will coordinate an appropriate time for 2 ADULT support persons, who may not rotate, to accompany patient in pre-op.  Children under the age of 92 must have an adult with them who is not the patient and must remain in the main waiting area with an adult.  If the patient needs to stay at the hospital during part of their recovery, the visitor guidelines for inpatient rooms apply.  Please refer to the [facility] website for the visitor guidelines for any additional information.   Special instructions:   [facility]- Preparing For Surgery   Please follow these instructions carefully.   Shower the NIGHT BEFORE SURGERY and the MORNING OF SURGERY with DIAL Soap.   Pat yourself dry with a CLEAN TOWEL.  Wear CLEAN PAJAMAS to bed the night before surgery  Place CLEAN SHEETS on your bed the night of your first shower and DO NOT SLEEP WITH PETS.   Additional instructions for the day of surgery: DO NOT APPLY any lotions, deodorants, cologne, or perfumes.   Do not wear jewelry or makeup Do not wear nail polish, gel polish, artificial nails, or any other type of covering on natural nails (fingers and toes) Do not bring valuables to the hospital. [facility] is not responsible for valuables/personal belongings. Put on clean/comfortable clothes.  Please brush your teeth.  Ask your nurse before applying any prescription medications to the skin.

## 2024-06-19 NOTE — Progress Notes (Signed)
 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 ED visit 05/13/24 for HTN (SBP 219) and headache. MRI brain with empty sella but negative for acute abnormality, but CTA Imaging showed 6 mm right PCOM aneurysm.  BP treated with hydralazine . Continued on home BP medication and referred to neurosurgery. ED evaluation again on 06/13/24 with  overnight admission for right sided headache, 10/10, sudden onset and similar to when she was diagnosed with cerebral aneurysm. Also with transient speech deficit. BP by EMS was 214/122. She was on ASA and Brilinta  for planned neurovascular intervention for her cerebral aneurysm. 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 Neurology recommended admission for optimization of antihypertensive regimen. She was continued on Cardizem , Coreg , telmisartan, Imdur , spironolactone , hydralazine .  TTE unremarkable. CCTA with no CAD by her cardiologist in 2024. Neurology recommended continuing ASA, Brilinta , and statin with plans for aneurysm repair by Dr. Lanis the following week (see note by Jerri Pfeiffer, MD).  Other history includes HTN, DM2, anemia, OSA (uses mouthguard), morbid obesity, dyspnea, hysterectomy.  A1c 7.5% on 06/14/24. Med list included Omnipod, Tresiba  200 unit/mL 56 units Q HS, Humulin R  U-500 Q AM, and he is on Tresiba , U-500 Humilin, Ozempic (last dose 06/14/24).    Anesthesia team to evaluate on the day of surgery.  Surgeon orders are pending. Will tentatively enter orders for CBC and T&S given H/H 9.1/29.4 on 06/14/24.    VS:  Ht 5' 6 (1.676 m)   Wt 125.2 kg   LMP 04/03/2018   BMI 44.55 kg/m  BP Readings from Last 3 Encounters:  06/14/24 (!) 156/81  05/31/24 127/84  05/13/24 (!) 178/108     PROVIDERS: Allen Lauraine CROME, PA-C is PCP  Fredrica Lipoma, MD is cardiologist   LABS: For day of surgery as indicated. Most recent results in [facility] include: Lab Results  Component Value Date   WBC 5.5 06/14/2024   HGB 9.1 (L) 06/14/2024   HCT 29.4 (L) 06/14/2024   PLT 173 06/14/2024   GLUCOSE 120 (H) 06/14/2024   CHOL 101 06/14/2024   TRIG 87 06/14/2024   HDL 38 (L) 06/14/2024   LDLCALC 46 06/14/2024   ALT 19 06/14/2024   AST 19 06/14/2024   NA 139 06/14/2024   K 3.5 06/14/2024   CL 107 06/14/2024   CREATININE 0.99 06/14/2024   BUN 17 06/14/2024   CO2 23 06/14/2024   TSH 1.68 02/19/2015   INR 0.9 06/13/2024   HGBA1C 7.5 (H) 06/14/2024     IMAGES: MRI Brain 06/14/24: IMPRESSION: 1. Few small patchy foci of diffusion signal abnormality involving the subcortical right frontal lobe as above, consistent with evolving subacute small vessel type infarcts. No associated hemorrhage or mass effect. 2. No other acute intracranial abnormality. 3. Underlying mild chronic microvascular ischemic disease.   Diagnostic Cerebral Angiogram 627/25: IMPRESSION:  1.  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      CT Head & CTA Head/Neck 05/13/24: IMPRESSION: 1. Negative CTA for large vessel occlusion or other emergent finding. 2.  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 3. No other acute intracranial abnormality. 4. Empty sella. While this finding is often incidental in nature and of no clinical significance, this can also be seen in the setting of idiopathic intracranial hypertension.    EKG: 06/13/24: Sinus rhythm Left atrial enlargement Nonspecific T abnormalities, lateral leads No significant change since last tracing Confirmed by Doretha Folks  [phone]) on 06/14/2024 10:33:36 PM   CV: Echo 06/14/24: IMPRESSIONS   1. Left ventricular ejection fraction, by estimation, is 60 to 65%. The  left ventricle has normal function. The left ventricle has no regional  wall motion abnormalities. There is moderate asymmetric left ventricular  hypertrophy of the basal-septal  segment. Left ventricular diastolic parameters are consistent with Grade I  diastolic dysfunction (impaired relaxation).   2. Right ventricular systolic function is normal. The right ventricular  size is normal. Tricuspid regurgitation signal is inadequate for assessing  PA pressure.   3. The mitral valve is normal in structure. Trivial mitral valve  regurgitation. No evidence of mitral stenosis.   4. The aortic valve was not well visualized. Aortic valve regurgitation  is not visualized. No aortic stenosis is present.   5. The inferior vena cava is normal in size with greater than 50%  respiratory variability, suggesting right atrial pressure of 3 mmHg.    CTA Coronary 04/27/23 ([facility]): IMPRESSION:  - Calcium  score of 0. This places the patient in the 0 percentile for age and race based on the [place] data base.  - The study was adequate but few misregistration artifacts and mild chest wall attenuation were noted and could mildly decrease specificity of the study.  - Normal trajectory of coronaries with right dominant system.  -  No plaque or stenosis was noted but few areas of misregistration artifacts and chest wall attenuation were noted and few areas of coronaries were difficult to assess accurately.    US  Carotid 04/05/23 ([facility]): Conclusion:  Right: Normal carotid artery system.  Left:  Normal carotid artery system.  Vertebral arteries are antegrade, bilaterally.   Past Medical History:  Diagnosis Date   Anemia    Diabetes mellitus without complication (HCC)    Dyspnea    Hypertension    Obesity    Posterior communicating artery aneurysm    TIA  (transient ischemic attack)    pt states she was told that she had 2 mini strokes- during recent hospitalization 06/2024)    Past Surgical History:  Procedure Laterality Date   ABDOMINAL HYSTERECTOMY     BREAST SURGERY  08/2011   breast reduction   CYSTOSCOPY  04/12/2018   Procedure: CYSTOSCOPY;  Surgeon: Arloa Lamar SQUIBB, MD;  Location: [facility] ORS;  Service: Gynecology;;   FOOT SURGERY Left    cyst removed   GALLBLADDER SURGERY  09/03/2018   IR ANGIO INTRA EXTRACRAN SEL INTERNAL CAROTID BILAT MOD SED  05/31/2024   IR ANGIO VERTEBRAL SEL VERTEBRAL BILAT MOD SED  05/31/2024   LAPAROSCOPIC HYSTERECTOMY Bilateral 04/12/2018   Procedure: HYSTERECTOMY TOTAL LAPAROSCOPIC BILATERAL SALPINGECTOMY;  Surgeon: Arloa Lamar SQUIBB, MD;  Location: [facility] ORS;  Service: Gynecology;  Laterality: Bilateral;   TUBAL LIGATION      MEDICATIONS: No current facility-administered medications for this encounter.    acetaminophen  (TYLENOL ) 500 MG tablet   albuterol  (ACCUNEB ) 1.25 MG/3ML nebulizer solution   albuterol  (VENTOLIN  HFA) 108 (90 Base) MCG/ACT inhaler   Ashwagandha 300 MG TABS   aspirin  81 MG chewable tablet   atorvastatin  (  LIPITOR) 80 MG tablet   BAYER ASPIRIN  325 MG tablet   carvedilol  (COREG ) 25 MG tablet   Cholecalciferol (VITAMIN D3) 1000 units CAPS   diltiazem  (CARDIZEM  CD) 360 MG 24 hr capsule   fluticasone  (FLONASE ) 50 MCG/ACT nasal spray   fluticasone  (FLOVENT  HFA) 220 MCG/ACT inhaler   HUMULIN R  U-500 KWIKPEN 500 UNIT/ML KwikPen   hydrALAZINE  (APRESOLINE ) 25 MG tablet   insulin  degludec (TRESIBA ) 200 UNIT/ML FlexTouch Pen   Insulin  Disposable Pump (OMNIPOD 5 DEXG7G6 INTRO GEN 5) KIT   isosorbide  mononitrate (IMDUR ) 30 MG 24 hr tablet   ketoconazole (NIZORAL) 2 % cream   omeprazole (PRILOSEC) 20 MG capsule   OZEMPIC, 2 MG/DOSE, 8 MG/3ML SOPN   spironolactone  (ALDACTONE ) 50 MG tablet   telmisartan (MICARDIS) 40 MG tablet   ticagrelor  (BRILINTA ) 90 MG TABS tablet   traMADol  (ULTRAM ) 50 MG  tablet   zolpidem  (AMBIEN ) 5 MG tablet    Isaiah Ruder, PA-C Surgical Short Stay/Anesthesiology [facility] Phone [phone] [facility] Phone [phone] 06/19/2024 10:33 AM

## 2024-06-20 ENCOUNTER — Encounter (HOSPITAL_COMMUNITY)
Admission: RE | Disposition: A | Discharge status: Home / Self Care | Payer: Self-pay | Source: Home / Self Care | Attending: Neurosurgery

## 2024-06-20 ENCOUNTER — Encounter (HOSPITAL_COMMUNITY): Payer: Self-pay

## 2024-06-20 ENCOUNTER — Ambulatory Visit (HOSPITAL_COMMUNITY): Payer: Self-pay | Admitting: Vascular Surgery

## 2024-06-20 ENCOUNTER — Inpatient Hospital Stay (HOSPITAL_COMMUNITY)
Admission: RE | Admit: 2024-06-20 | Discharge: 2024-06-20 | Disposition: A | Discharge status: Home / Self Care | Source: Ambulatory Visit | Attending: Neurosurgery | Admitting: Neurosurgery

## 2024-06-20 ENCOUNTER — Encounter (HOSPITAL_COMMUNITY): Payer: Self-pay | Admitting: Neurosurgery

## 2024-06-20 ENCOUNTER — Other Ambulatory Visit: Payer: Self-pay

## 2024-06-20 ENCOUNTER — Observation Stay (HOSPITAL_COMMUNITY)
Admission: RE | Admit: 2024-06-20 | Discharge: 2024-06-21 | Disposition: A | Discharge status: Home / Self Care | Attending: Neurosurgery | Admitting: Neurosurgery

## 2024-06-20 DIAGNOSIS — I671 Cerebral aneurysm, nonruptured: Secondary | ICD-10-CM

## 2024-06-20 DIAGNOSIS — I5030 Unspecified diastolic (congestive) heart failure: Secondary | ICD-10-CM | POA: Insufficient documentation

## 2024-06-20 DIAGNOSIS — E1122 Type 2 diabetes mellitus with diabetic chronic kidney disease: Secondary | ICD-10-CM | POA: Insufficient documentation

## 2024-06-20 DIAGNOSIS — Z8673 Personal history of transient ischemic attack (TIA), and cerebral infarction without residual deficits: Secondary | ICD-10-CM | POA: Diagnosis not present

## 2024-06-20 DIAGNOSIS — D649 Anemia, unspecified: Principal | ICD-10-CM

## 2024-06-20 DIAGNOSIS — N183 Chronic kidney disease, stage 3 unspecified: Secondary | ICD-10-CM | POA: Insufficient documentation

## 2024-06-20 DIAGNOSIS — I130 Hypertensive heart and chronic kidney disease with heart failure and stage 1 through stage 4 chronic kidney disease, or unspecified chronic kidney disease: Secondary | ICD-10-CM | POA: Insufficient documentation

## 2024-06-20 DIAGNOSIS — Z7982 Long term (current) use of aspirin: Secondary | ICD-10-CM | POA: Insufficient documentation

## 2024-06-20 DIAGNOSIS — I5032 Chronic diastolic (congestive) heart failure: Secondary | ICD-10-CM | POA: Insufficient documentation

## 2024-06-20 DIAGNOSIS — Z794 Long term (current) use of insulin: Secondary | ICD-10-CM | POA: Insufficient documentation

## 2024-06-20 DIAGNOSIS — Z79899 Other long term (current) drug therapy: Secondary | ICD-10-CM | POA: Insufficient documentation

## 2024-06-20 DIAGNOSIS — E785 Hyperlipidemia, unspecified: Secondary | ICD-10-CM | POA: Insufficient documentation

## 2024-06-20 HISTORY — PX: IR NEURO EACH ADD'L AFTER BASIC UNI RIGHT (MS): IMG5374

## 2024-06-20 HISTORY — DX: Transient cerebral ischemic attack, unspecified: G45.9

## 2024-06-20 HISTORY — PX: IR US GUIDE VASC ACCESS RIGHT: IMG2390

## 2024-06-20 HISTORY — PX: IR ANGIOGRAM FOLLOW UP STUDY: IMG697

## 2024-06-20 HISTORY — DX: Dyspnea, unspecified: R06.00

## 2024-06-20 HISTORY — PX: IR TRANSCATH/EMBOLIZ: IMG695

## 2024-06-20 HISTORY — PX: RADIOLOGY WITH ANESTHESIA: SHX6223

## 2024-06-20 HISTORY — PX: IR INTRA CRAN STENT: IMG2345

## 2024-06-20 HISTORY — DX: Cerebral aneurysm, nonruptured: I67.1

## 2024-06-20 LAB — CBC
HCT: 33.1 % — ABNORMAL LOW (ref 36.0–46.0)
Hemoglobin: 10 g/dL — ABNORMAL LOW (ref 12.0–15.0)
MCH: 22.8 pg — ABNORMAL LOW (ref 26.0–34.0)
MCHC: 30.2 g/dL (ref 30.0–36.0)
MCV: 75.4 fL — ABNORMAL LOW (ref 80.0–100.0)
Platelets: 180 K/uL (ref 150–400)
RBC: 4.39 MIL/uL (ref 3.87–5.11)
RDW: 14.8 % (ref 11.5–15.5)
WBC: 5.8 K/uL (ref 4.0–10.5)
nRBC: 0 % (ref 0.0–0.2)

## 2024-06-20 LAB — GLUCOSE, CAPILLARY
Glucose-Capillary: 124 mg/dL — ABNORMAL HIGH (ref 70–99)
Glucose-Capillary: 112 mg/dL — ABNORMAL HIGH (ref 70–99)
Glucose-Capillary: 85 mg/dL (ref 70–99)
Glucose-Capillary: 136 mg/dL — ABNORMAL HIGH (ref 70–99)
Glucose-Capillary: 122 mg/dL — ABNORMAL HIGH (ref 70–99)
Glucose-Capillary: 198 mg/dL — ABNORMAL HIGH (ref 70–99)
Glucose-Capillary: 199 mg/dL — ABNORMAL HIGH (ref 70–99)

## 2024-06-20 LAB — POCT ACTIVATED CLOTTING TIME: Activated Clotting Time: 222 s

## 2024-06-20 LAB — TYPE AND SCREEN
ABO/RH(D): A POS
Antibody Screen: NEGATIVE

## 2024-06-20 LAB — MRSA NEXT GEN BY PCR, NASAL: MRSA by PCR Next Gen: NOT DETECTED

## 2024-06-20 SURGERY — RADIOLOGY WITH ANESTHESIA
Anesthesia: General

## 2024-06-20 MED ORDER — BISACODYL 10 MG RE SUPP: 10.0000 mg | Freq: Every day | RECTAL | Status: DC | PRN

## 2024-06-20 MED ORDER — CHLORHEXIDINE GLUCONATE 0.12 % MT SOLN: 15.0000 mL | Freq: Once | OROMUCOSAL | Status: DC

## 2024-06-20 MED ORDER — ALBUTEROL SULFATE HFA 108 (90 BASE) MCG/ACT IN AERS: 2.0000 | INHALATION_SPRAY | Freq: Four times a day (QID) | RESPIRATORY_TRACT | Status: DC | PRN

## 2024-06-20 MED ORDER — OXYCODONE HCL 5 MG PO TABS: 5.0000 mg | ORAL_TABLET | Freq: Once | ORAL | Status: DC | PRN

## 2024-06-20 MED ORDER — FENTANYL CITRATE (PF) 100 MCG/2ML IJ SOLN
INTRAMUSCULAR | Status: DC | PRN
  Administered 2024-06-20 (×2): 50 ug via INTRAVENOUS

## 2024-06-20 MED ORDER — SPIRONOLACTONE 25 MG PO TABS
50.0000 mg | ORAL_TABLET | Freq: Two times a day (BID) | ORAL | Status: DC
  Administered 2024-06-20 – 2024-06-21 (×2): 50 mg via ORAL
  Filled 2024-06-20 (×2): qty 2

## 2024-06-20 MED ORDER — DEXTROSE 50 % IV SOLN
25.0000 mL | Freq: Once | INTRAVENOUS | Status: AC
  Administered 2024-06-20: 25 mL via INTRAVENOUS

## 2024-06-20 MED ORDER — BUDESONIDE 0.5 MG/2ML IN SUSP
0.5000 mg | Freq: Two times a day (BID) | RESPIRATORY_TRACT | Status: DC
  Filled 2024-06-20: qty 2

## 2024-06-20 MED ORDER — ONDANSETRON HCL 4 MG/2ML IJ SOLN
4.0000 mg | INTRAMUSCULAR | Status: DC | PRN
Start: 2024-06-20 — End: 2024-06-21

## 2024-06-20 MED ORDER — SODIUM CHLORIDE 0.9 % IV SOLN: INTRAVENOUS | Status: DC

## 2024-06-20 MED ORDER — CHLORHEXIDINE GLUCONATE 0.12 % MT SOLN
15.0000 mL | Freq: Once | OROMUCOSAL | Status: AC
  Administered 2024-06-20: 15 mL via OROMUCOSAL

## 2024-06-20 MED ORDER — TICAGRELOR 90 MG PO TABS
90.0000 mg | ORAL_TABLET | Freq: Once | ORAL | Status: AC
  Administered 2024-06-20: 90 mg via ORAL
  Filled 2024-06-20: qty 1

## 2024-06-20 MED ORDER — ACETAMINOPHEN 325 MG PO TABS
650.0000 mg | ORAL_TABLET | ORAL | Status: DC | PRN
Start: 2024-06-20 — End: 2024-06-21

## 2024-06-20 MED ORDER — EPHEDRINE SULFATE-NACL 50-0.9 MG/10ML-% IV SOSY
PREFILLED_SYRINGE | INTRAVENOUS | Status: DC | PRN
  Administered 2024-06-20 (×2): 5 mg via INTRAVENOUS

## 2024-06-20 MED ORDER — ORAL CARE MOUTH RINSE: 15.0000 mL | Freq: Once | OROMUCOSAL | Status: AC

## 2024-06-20 MED ORDER — LACTATED RINGERS IV SOLN: INTRAVENOUS | Status: DC | PRN

## 2024-06-20 MED ORDER — ROCURONIUM BROMIDE 10 MG/ML (PF) SYRINGE
PREFILLED_SYRINGE | INTRAVENOUS | Status: DC | PRN
  Administered 2024-06-20: 50 mg via INTRAVENOUS
  Administered 2024-06-20: 10 mg via INTRAVENOUS

## 2024-06-20 MED ORDER — ONDANSETRON HCL 4 MG PO TABS: 4.0000 mg | ORAL_TABLET | ORAL | Status: DC | PRN

## 2024-06-20 MED ORDER — FENTANYL CITRATE (PF) 100 MCG/2ML IJ SOLN
INTRAMUSCULAR | Status: AC
  Filled 2024-06-20: qty 2

## 2024-06-20 MED ORDER — SENNOSIDES-DOCUSATE SODIUM 8.6-50 MG PO TABS: 1.0000 | ORAL_TABLET | Freq: Every evening | ORAL | Status: DC | PRN

## 2024-06-20 MED ORDER — CLEVIDIPINE BUTYRATE 0.5 MG/ML IV EMUL
0.0000 mg/h | INTRAVENOUS | Status: DC
  Administered 2024-06-20 – 2024-06-21 (×2): 21 mg/h via INTRAVENOUS
  Administered 2024-06-21: 2 mg/h via INTRAVENOUS
  Administered 2024-06-21: 19 mg/h via INTRAVENOUS
  Filled 2024-06-20: qty 100
  Filled 2024-06-20 (×2): qty 50

## 2024-06-20 MED ORDER — ASPIRIN 81 MG PO CHEW
81.0000 mg | CHEWABLE_TABLET | Freq: Every day | ORAL | Status: DC
  Administered 2024-06-21: 81 mg via ORAL
  Filled 2024-06-20: qty 1

## 2024-06-20 MED ORDER — CHLORHEXIDINE GLUCONATE CLOTH 2 % EX PADS
6.0000 | MEDICATED_PAD | Freq: Every day | CUTANEOUS | Status: DC
  Administered 2024-06-20 – 2024-06-21 (×2): 6 via TOPICAL

## 2024-06-20 MED ORDER — TICAGRELOR 90 MG PO TABS
90.0000 mg | ORAL_TABLET | Freq: Two times a day (BID) | ORAL | Status: DC
  Administered 2024-06-20 – 2024-06-21 (×2): 90 mg via ORAL
  Filled 2024-06-20 (×2): qty 1

## 2024-06-20 MED ORDER — ASPIRIN 81 MG PO CHEW
81.0000 mg | CHEWABLE_TABLET | Freq: Once | ORAL | Status: AC
  Administered 2024-06-20: 81 mg via ORAL
  Filled 2024-06-20: qty 1

## 2024-06-20 MED ORDER — ISOSORBIDE MONONITRATE ER 30 MG PO TB24
30.0000 mg | ORAL_TABLET | Freq: Every day | ORAL | Status: DC
  Administered 2024-06-21: 30 mg via ORAL
  Filled 2024-06-20: qty 1

## 2024-06-20 MED ORDER — INSULIN REGULAR HUMAN (CONC) 500 UNIT/ML ~~LOC~~ SOPN
60.0000 [IU] | PEN_INJECTOR | Freq: Every day | SUBCUTANEOUS | Status: DC
  Administered 2024-06-21: 60 [IU] via SUBCUTANEOUS
  Filled 2024-06-20: qty 3

## 2024-06-20 MED ORDER — HEPARIN SODIUM (PORCINE) 1000 UNIT/ML IJ SOLN
INTRAMUSCULAR | Status: DC | PRN
  Administered 2024-06-20: 5000 [IU] via INTRAVENOUS
  Administered 2024-06-20: 1000 [IU] via INTRAVENOUS

## 2024-06-20 MED ORDER — LIDOCAINE 2% (20 MG/ML) 5 ML SYRINGE
INTRAMUSCULAR | Status: DC | PRN
  Administered 2024-06-20: 100 mg via INTRAVENOUS

## 2024-06-20 MED ORDER — LABETALOL HCL 5 MG/ML IV SOLN: 10.0000 mg | INTRAVENOUS | Status: DC | PRN

## 2024-06-20 MED ORDER — ONDANSETRON HCL 4 MG/2ML IJ SOLN
INTRAMUSCULAR | Status: DC | PRN
  Administered 2024-06-20: 4 mg via INTRAVENOUS

## 2024-06-20 MED ORDER — AMISULPRIDE (ANTIEMETIC) 5 MG/2ML IV SOLN: 10.0000 mg | Freq: Once | INTRAVENOUS | Status: DC | PRN

## 2024-06-20 MED ORDER — ALBUTEROL SULFATE 1.25 MG/3ML IN NEBU: 1.0000 | INHALATION_SOLUTION | Freq: Four times a day (QID) | RESPIRATORY_TRACT | Status: DC | PRN

## 2024-06-20 MED ORDER — DILTIAZEM HCL ER COATED BEADS 180 MG PO CP24
360.0000 mg | ORAL_CAPSULE | Freq: Every day | ORAL | Status: DC
  Administered 2024-06-20: 360 mg via ORAL
  Filled 2024-06-20: qty 2

## 2024-06-20 MED ORDER — INSULIN ASPART 100 UNIT/ML IJ SOLN: 0.0000 [IU] | INTRAMUSCULAR | Status: DC | PRN

## 2024-06-20 MED ORDER — PHENYLEPHRINE HCL-NACL 20-0.9 MG/250ML-% IV SOLN
INTRAVENOUS | Status: DC | PRN
Start: 2024-06-20 — End: 2024-06-20
  Administered 2024-06-20: 40 ug/min via INTRAVENOUS

## 2024-06-20 MED ORDER — SUGAMMADEX SODIUM 200 MG/2ML IV SOLN
INTRAVENOUS | Status: DC | PRN
  Administered 2024-06-20: 300 mg via INTRAVENOUS

## 2024-06-20 MED ORDER — CLEVIDIPINE BUTYRATE 0.5 MG/ML IV EMUL
0.0000 mg/h | INTRAVENOUS | Status: DC
  Administered 2024-06-20: 2 mg/h via INTRAVENOUS

## 2024-06-20 MED ORDER — CHLORHEXIDINE GLUCONATE CLOTH 2 % EX PADS: 6.0000 | MEDICATED_PAD | Freq: Every day | CUTANEOUS | Status: DC

## 2024-06-20 MED ORDER — OXYCODONE HCL 5 MG/5ML PO SOLN: 5.0000 mg | Freq: Once | ORAL | Status: DC | PRN

## 2024-06-20 MED ORDER — ACETAMINOPHEN 650 MG RE SUPP: 650.0000 mg | RECTAL | Status: DC | PRN

## 2024-06-20 MED ORDER — DEXTROSE 50 % IV SOLN
INTRAVENOUS | Status: AC
  Filled 2024-06-20: qty 50

## 2024-06-20 MED ORDER — ORAL CARE MOUTH RINSE: 15.0000 mL | OROMUCOSAL | Status: DC | PRN

## 2024-06-20 MED ORDER — ORAL CARE MOUTH RINSE: 15.0000 mL | Freq: Once | OROMUCOSAL | Status: DC

## 2024-06-20 MED ORDER — DEXAMETHASONE SODIUM PHOSPHATE 10 MG/ML IJ SOLN
INTRAMUSCULAR | Status: DC | PRN
  Administered 2024-06-20: 5 mg via INTRAVENOUS

## 2024-06-20 MED ORDER — CLEVIDIPINE BUTYRATE 0.5 MG/ML IV EMUL
INTRAVENOUS | Status: AC
  Filled 2024-06-20: qty 100

## 2024-06-20 MED ORDER — PANTOPRAZOLE SODIUM 40 MG PO TBEC
40.0000 mg | DELAYED_RELEASE_TABLET | Freq: Every day | ORAL | Status: DC
  Administered 2024-06-21: 40 mg via ORAL
  Filled 2024-06-20: qty 1

## 2024-06-20 MED ORDER — ALBUTEROL SULFATE (2.5 MG/3ML) 0.083% IN NEBU: 2.5000 mg | INHALATION_SOLUTION | RESPIRATORY_TRACT | Status: DC | PRN

## 2024-06-20 MED ORDER — ZOLPIDEM TARTRATE 5 MG PO TABS: 5.0000 mg | ORAL_TABLET | Freq: Every evening | ORAL | Status: DC | PRN

## 2024-06-20 MED ORDER — PHENYLEPHRINE 80 MCG/ML (10ML) SYRINGE FOR IV PUSH (FOR BLOOD PRESSURE SUPPORT)
PREFILLED_SYRINGE | INTRAVENOUS | Status: DC | PRN
  Administered 2024-06-20: 80 ug via INTRAVENOUS
  Administered 2024-06-20: 160 ug via INTRAVENOUS

## 2024-06-20 MED ORDER — INSULIN ASPART 100 UNIT/ML IJ SOLN
0.0000 [IU] | INTRAMUSCULAR | Status: DC
  Administered 2024-06-21: 5 [IU] via SUBCUTANEOUS
  Administered 2024-06-21 (×2): 2 [IU] via SUBCUTANEOUS

## 2024-06-20 MED ORDER — INSULIN DEGLUDEC 200 UNIT/ML ~~LOC~~ SOPN: 56.0000 [IU] | PEN_INJECTOR | Freq: Every day | SUBCUTANEOUS | Status: DC

## 2024-06-20 MED ORDER — CARVEDILOL 12.5 MG PO TABS
25.0000 mg | ORAL_TABLET | Freq: Two times a day (BID) | ORAL | Status: DC
  Administered 2024-06-20 – 2024-06-21 (×2): 25 mg via ORAL
  Filled 2024-06-20 (×2): qty 2

## 2024-06-20 MED ORDER — HYDRALAZINE HCL 25 MG PO TABS
25.0000 mg | ORAL_TABLET | Freq: Three times a day (TID) | ORAL | Status: DC
  Administered 2024-06-20 – 2024-06-21 (×2): 25 mg via ORAL
  Filled 2024-06-20 (×2): qty 1

## 2024-06-20 MED ORDER — FENTANYL CITRATE (PF) 100 MCG/2ML IJ SOLN
25.0000 ug | INTRAMUSCULAR | Status: DC | PRN
  Administered 2024-06-20 (×2): 25 ug via INTRAVENOUS

## 2024-06-20 MED ORDER — IOHEXOL 300 MG/ML  SOLN
150.0000 mL | Freq: Once | INTRAMUSCULAR | Status: AC | PRN
  Administered 2024-06-20: 80 mL via INTRA_ARTERIAL

## 2024-06-20 MED ORDER — CHLORHEXIDINE GLUCONATE 0.12 % MT SOLN
OROMUCOSAL | Status: AC
  Filled 2024-06-20: qty 15

## 2024-06-20 MED ORDER — OXYCODONE-ACETAMINOPHEN 5-325 MG PO TABS
1.0000 | ORAL_TABLET | ORAL | Status: DC | PRN
  Administered 2024-06-20: 1 via ORAL
  Filled 2024-06-20: qty 1

## 2024-06-20 MED ORDER — ATORVASTATIN CALCIUM 80 MG PO TABS
80.0000 mg | ORAL_TABLET | Freq: Every day | ORAL | Status: DC
  Administered 2024-06-21: 80 mg via ORAL
  Filled 2024-06-20: qty 1

## 2024-06-20 MED ORDER — PROPOFOL 10 MG/ML IV BOLUS
INTRAVENOUS | Status: DC | PRN
  Administered 2024-06-20: 150 mg via INTRAVENOUS

## 2024-06-20 MED ORDER — IRBESARTAN 150 MG PO TABS
150.0000 mg | ORAL_TABLET | Freq: Every day | ORAL | Status: DC
  Administered 2024-06-20 – 2024-06-21 (×2): 150 mg via ORAL
  Filled 2024-06-20 (×2): qty 1

## 2024-06-20 MED ORDER — INSULIN GLARGINE-YFGN 100 UNIT/ML ~~LOC~~ SOLN
56.0000 [IU] | Freq: Every day | SUBCUTANEOUS | Status: DC
  Administered 2024-06-20: 56 [IU] via SUBCUTANEOUS
  Filled 2024-06-20 (×2): qty 0.56

## 2024-06-20 NOTE — Anesthesia Procedure Notes (Addendum)
 Procedure Name: Intubation Date/Time: 06/20/2024 5:21 PM  Performed by: Atanacio Arland HERO, CRNAPre-anesthesia Checklist: Patient identified, Emergency Drugs available, Suction available and Patient being monitored Patient Re-evaluated:Patient Re-evaluated prior to induction Oxygen Delivery Method: Circle System Utilized Preoxygenation: Pre-oxygenation with 100% oxygen Induction Type: IV induction Ventilation: Mask ventilation without difficulty Laryngoscope Size: Mac and 4 Grade View: Grade II Tube type: Oral Tube size: 7.0 mm Number of attempts: 1 Airway Equipment and Method: Stylet Placement Confirmation: ETT inserted through vocal cords under direct vision, positive ETCO2 and breath sounds checked- equal and bilateral Secured at: 23 cm Tube secured with: Tape Dental Injury: Teeth and Oropharynx as per pre-operative assessment

## 2024-06-20 NOTE — Anesthesia Preprocedure Evaluation (Addendum)
 Anesthesia Evaluation  Patient identified by MRN, date of birth, ID band Patient awake    Reviewed: Allergy & Precautions, NPO status , Patient's Chart, lab work & pertinent test results  Airway Mallampati: II  TM Distance: >3 FB Neck ROM: Full    Dental no notable dental hx. (+) Teeth Intact, Dental Advisory Given   Pulmonary sleep apnea (mouth guard)    Pulmonary exam normal breath sounds clear to auscultation       Cardiovascular hypertension, Pt. on home beta blockers and Pt. on medications Normal cardiovascular exam Rhythm:Regular Rate:Normal  TTE 2025 1. Left ventricular ejection fraction, by estimation, is 60 to 65%. The  left ventricle has normal function. The left ventricle has no regional  wall motion abnormalities. There is moderate asymmetric left ventricular  hypertrophy of the basal-septal  segment. Left ventricular diastolic parameters are consistent with Grade I  diastolic dysfunction (impaired relaxation).   2. Right ventricular systolic function is normal. The right ventricular  size is normal. Tricuspid regurgitation signal is inadequate for assessing  PA pressure.   3. The mitral valve is normal in structure. Trivial mitral valve  regurgitation. No evidence of mitral stenosis.   4. The aortic valve was not well visualized. Aortic valve regurgitation  is not visualized. No aortic stenosis is present.   5. The inferior vena cava is normal in size with greater than 50%  respiratory variability, suggesting right atrial pressure of 3 mmHg.     Neuro/Psych  Headaches TIACVA, No Residual Symptoms  negative psych ROS   GI/Hepatic negative GI ROS, Neg liver ROS,,,  Endo/Other  diabetes, Type 2, Insulin  Dependent  Class 3 obesity (BMI 40)  Renal/GU Renal disease  negative genitourinary   Musculoskeletal negative musculoskeletal ROS (+)    Abdominal   Peds  Hematology  (+) Blood dyscrasia (Brilinta ),  anemia Lab Results      Component                Value               Date                      WBC                      5.8                 06/20/2024                HGB                      10.0 (L)            06/20/2024                HCT                      33.1 (L)            06/20/2024                MCV                      75.4 (L)            06/20/2024                PLT  180                 06/20/2024              Anesthesia Other Findings 53 year old female recently discharged 7/11 following  right frontal subacute infarct, after presenting w/ cc: HA and HTN, placed on asa and Brillinta. 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 Underwent dx angiogram prior to planned surgery. Presented 7/17 for planned pipeline embolization.   Reproductive/Obstetrics                              Anesthesia Physical Anesthesia Plan  ASA: 3  Anesthesia Plan: General   Post-op Pain Management: Minimal or no pain anticipated   Induction: Intravenous  PONV Risk Score and Plan: 3 and Dexamethasone , Ondansetron  and Treatment may vary due to age or medical condition  Airway Management Planned: Oral ETT  Additional Equipment: Arterial line  Intra-op Plan:   Post-operative Plan: Extubation in OR  Informed Consent: I have reviewed the patients History and Physical, chart, labs and discussed the procedure including the risks, benefits and alternatives for the proposed anesthesia with the patient or authorized representative who has indicated his/her understanding and acceptance.     Dental advisory given  Plan Discussed with: CRNA  Anesthesia Plan Comments:          Anesthesia Quick Evaluation

## 2024-06-20 NOTE — H&P (Signed)
 Chief Complaint   Aneurysm  History of Present Illness  Grace Gallagher is a 53 year old woman seen for initial consultation after recent visit to the emergency department for hypertensive urgency.  Patient has a history of difficult-to-control hypertension.  She noted relatively severe headache associated with some dizziness and blurry vision as well as some numbness in her right hand.  She therefore called her primary doctor who instructed her to go to the emergency department.  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  She was therefore referred for outpatient for surgical evaluation.  She does continue to report some headache albeit significantly better than it was in the emergency department. She underwent diagnostic angiogram further delineating the aneurysm and elected to proceed with Pipeline embolization. She has been pre-treated with ASA/Brilinta .  Of note, the patient has refractory hypertension.  She also reports a history of diabetes.  No previous heart attack or stroke.  No known liver or kidney disease, she does have asthma.  She is not on any blood thinners or antiplatelet agents.  She is a nonsmoker.  There is no known family history of intracranial aneurysms or explained intracranial hemorrhage.  Past Medical History   Past Medical History:  Diagnosis Date   Anemia    Diabetes mellitus without complication (HCC)    Dyspnea    Hypertension    Obesity    Posterior communicating artery aneurysm    TIA (transient ischemic attack)    pt states she was told that she had 2 mini strokes- during recent hospitalization 06/2024)    Past Surgical History   Past Surgical History:  Procedure Laterality Date   ABDOMINAL HYSTERECTOMY     BREAST SURGERY  08/2011   breast reduction   CYSTOSCOPY  04/12/2018   Procedure: CYSTOSCOPY;  Surgeon: Arloa Lamar SQUIBB,  MD;  Location: [facility] ORS;  Service: Gynecology;;   FOOT SURGERY Left    cyst removed   GALLBLADDER SURGERY  09/03/2018   IR ANGIO INTRA EXTRACRAN SEL INTERNAL CAROTID BILAT MOD SED  05/31/2024   IR ANGIO VERTEBRAL SEL VERTEBRAL BILAT MOD SED  05/31/2024   LAPAROSCOPIC HYSTERECTOMY Bilateral 04/12/2018   Procedure: HYSTERECTOMY TOTAL LAPAROSCOPIC BILATERAL SALPINGECTOMY;  Surgeon: Arloa Lamar SQUIBB, MD;  Location: [facility] ORS;  Service: Gynecology;  Laterality: Bilateral;   TUBAL LIGATION      Social History   Social History   Tobacco Use   Smoking status: Never   Smokeless tobacco: Never  Vaping Use   Vaping status: Never Used  Substance Use Topics   Alcohol use: No   Drug use: No    Medications   Prior to Admission medications   Medication Sig Start Date End Date Taking? Authorizing Provider  acetaminophen  (TYLENOL ) 500 MG tablet Take 1,000 mg by mouth every 6 (six) hours as needed for mild pain (pain score 1-3) or headache.    [provider]  albuterol  (ACCUNEB ) 1.25 MG/3ML nebulizer solution Take 1 ampule by nebulization every 6 (six) hours as needed for wheezing or shortness of breath. 04/25/24   [provider]  albuterol  (VENTOLIN  HFA) 108 (90 Base) MCG/ACT inhaler Inhale 2 puffs into the lungs every 6 (six) hours as needed for wheezing or shortness of breath.    [provider]  Ashwagandha 300 MG TABS Take 300 mg by mouth daily.    [provider]  aspirin  81 MG chewable tablet  Chew 81 mg by mouth daily. 06/11/24   [provider]  atorvastatin  (LIPITOR) 80 MG tablet Take 80 mg by mouth daily. 05/20/24   [provider]  BAYER ASPIRIN  325 MG tablet Take 325 mg by mouth daily.    [provider]  carvedilol  (COREG ) 25 MG tablet Take 1 tablet (25 mg total) by mouth 2 (two) times daily. 01/03/20 11/03/24  Vannie Reche RAMAN, NP  Cholecalciferol (VITAMIN D3) 1000 units CAPS Take 1,000 Units by mouth daily.    [provider]  diltiazem  (CARDIZEM  CD) 360 MG 24 hr capsule Take 360 mg by mouth at bedtime.    [provider]  fluticasone  (FLONASE ) 50 MCG/ACT nasal spray Place 2 sprays into both nostrils daily. 05/13/24   [provider]  fluticasone  (FLOVENT  HFA) 220 MCG/ACT inhaler Inhale 2 puffs into the lungs 2 (two) times daily. 04/13/24   [provider]  HUMULIN R  U-500 KWIKPEN 500 UNIT/ML KwikPen Inject 60 Units into the skin daily with breakfast.    [provider]  hydrALAZINE  (APRESOLINE ) 25 MG tablet Take 25 mg by mouth 3 (three) times daily. Take two tablets by mouth three time daily.    [provider]  insulin  degludec (TRESIBA ) 200 UNIT/ML FlexTouch Pen Inject 56 Units into the skin at bedtime. Inject 56 units subcutaneously at bedtime    [provider]  Insulin  Disposable Pump (OMNIPOD 5 DEXG7G6 INTRO GEN 5) KIT Inject into the skin. 05/17/24   [provider]  isosorbide  mononitrate (IMDUR ) 30 MG 24 hr tablet Take 30 mg by mouth daily.    [provider]  ketoconazole (NIZORAL) 2 % cream Apply 1 Application topically daily. Apply a thin layer to face daily.    [provider]  omeprazole (PRILOSEC) 20 MG capsule Take 20 mg by mouth daily. 06/10/24   [provider]  OZEMPIC, 2 MG/DOSE, 8 MG/3ML SOPN Inject 2 mg into the skin once a week. Inject 2mg  subcutaneously once per week on Friday. 05/28/24   [provider]  spironolactone  (ALDACTONE ) 50 MG tablet Take 50 mg by mouth 2 (two) times daily.    [provider]  telmisartan (MICARDIS) 40 MG tablet Take 40 mg by mouth 2 (two) times daily.    [provider]  ticagrelor  (BRILINTA ) 90 MG TABS tablet Take 90 mg by mouth 2 (two) times daily. 06/12/24   [provider]  traMADol  (ULTRAM ) 50 MG tablet Take 50 mg by mouth every 6 (six) hours as needed (for pain).    [provider]  zolpidem  (AMBIEN ) 5 MG tablet Take 5 mg by  mouth at bedtime as needed for sleep.    [provider]    Allergies   Allergies  Allergen Reactions   Duloxetine Other (See Comments)    Increased somnolence.    Egg-Derived Products Rash   Haemophilus B Polysaccharide Vaccine Rash   Influenza Virus Vaccine Rash and Other (See Comments)    Develops a cold the day after receiving it- can tolerate the shot if it is received at a doctor's office (MUST be egg-free)   Atrovent  Nasal Spray [Ipratropium] Other (See Comments)    Causes nose bleed   Hydrocodone  Nausea Only   Metformin And Related Diarrhea    Review of Systems  ROS  Neurologic Exam  Awake, alert, oriented Memory and concentration grossly intact Speech fluent, appropriate CN grossly intact Motor exam: Upper Extremities Deltoid Bicep Tricep Grip  Right 5/5 5/5 5/5  5/5  Left 5/5 5/5 5/5 5/5   Lower Extremities IP Quad PF DF EHL  Right 5/5 5/5 5/5 5/5 5/5  Left 5/5 5/5 5/5 5/5 5/5   Sensation grossly intact to LT  Imaging  DSA reveals 6mm right Pcom aneurysm  Impression  - 53 y.o. female with incidental 6mm right Pcom aneurysm, amenable to flow diversion  Plan  - Will proceed with Pipeline embolization of RPcom aneurysm  I have reviewed the indications for the procedure as well as the details of the procedure and the expected postoperative course and recovery at length with the patient in the office. We have also reviewed in detail the risks, benefits, and alternatives to the procedure. All questions were answered and Grace Gallagher provided informed consent to proceed.  Grace Maizes, MD [facility]

## 2024-06-20 NOTE — Progress Notes (Incomplete)
 Attending note: I have seen and examined the patient. History, labs and imaging reviewed.  53 year old with uncontrolled hypertension, Rt frontal sub acute infarct, rt PCOM aneurysm. Has a pipeline embolization of aneurym.  Patient admitted to the ICU postprocedure and PCCM called for help with management  Blood pressure 93/66, pulse 72, temperature 97.9 F (36.6 C), temperature source Oral, resp. rate 16, height 5' 6 (1.676 m), weight 112 kg, last menstrual period 04/03/2018, SpO2 96%. Gen:      No acute distress HEENT:  EOMI, sclera anicteric Neck:     No masses; no thyromegaly Lungs:    Clear to auscultation bilaterally; normal respiratory effort*** CV:         Regular rate and rhythm; no murmurs Abd:      + bowel sounds; soft, non-tender; no palpable masses, no distension Ext:    No edema; adequate peripheral perfusion Neuro: alert and oriented x 3 Psych: normal mood and affect   Labs/Imaging personally reviewed, significant for Hemoglobin 10, platelets 188 No recent metabolic panel  Assessment/plan: Right P-comm aneurysm status post pipeline embolization Monitor in the ICU. Supportive care Continue aspirin , Brilinta   Hyperglycemia SSI coverage  The patient is critically ill with multiple organ systems failure and requires high complexity decision making for assessment and support, frequent evaluation and titration of therapies, application of advanced monitoring technologies and extensive interpretation of multiple databases.  Critical care time - 35 mins. This represents my time independent of the NPs time taking care of the pt.  Seng Fouts MD [facility] 06/20/2024, 5:04 PM

## 2024-06-20 NOTE — Transfer of Care (Signed)
 Immediate Anesthesia Transfer of Care Note  Patient: Grace Gallagher  Procedure(s) Performed: RADIOLOGY WITH ANESTHESIA  Patient Location: PACU  Anesthesia Type:General  Level of Consciousness: awake, alert , and oriented  Airway & Oxygen Therapy: Patient Spontanous Breathing and Patient connected to face mask oxygen  Post-op Assessment: Report given to RN and Post -op Vital signs reviewed and unstable, Anesthesiologist notified  Post vital signs: Reviewed and stable  Last Vitals:  Vitals Value Taken Time  BP 144/79 06/20/24 19:45  Temp    Pulse 75 06/20/24 19:56  Resp 15 06/20/24 19:56  SpO2 94 % 06/20/24 19:56  Vitals shown include unfiled device data.  Last Pain:  Vitals:   06/20/24 1320  TempSrc: Oral  PainSc: 4          Complications: No notable events documented.

## 2024-06-20 NOTE — Progress Notes (Signed)
 eLink Physician-Brief Progress Note Patient Name: Grace Gallagher DOB: November 08, 1971 MRN: [id number]   Date of Service  06/20/2024  HPI/Events of Note  53 year old female with uncontrolled hypertension that has a known right P-comm aneurysm that presents for pipeline embolization admitted postoperatively for neurological observation  Vital signs are within normal limits on examination saturating 94% on 2 L of oxygen.  Patient has cough propofol  and phenylephrine , currently on clevidipine  at 20 mg for blood pressure management.  Unremarkable preoperative labs.  eICU Interventions  Aggressive antihypertensive therapy on numerous agents.  Clevidipine  as well, SBP goal less than 140  Frequent neurological assessments, no antiplatelet therapy, interval imaging per neurosurgery  Sliding scale insulin  and glargine  DVT prophylaxis with SCDs GI prophylaxis with pantoprazole      Intervention Category Evaluation Type: New Patient Evaluation  Grace Gallagher 06/20/2024, 9:21 PM

## 2024-06-20 NOTE — Consult Note (Signed)
 NAME:  Grace Gallagher, MRN:  [id number], DOB:  1971/11/28, LOS: 0 ADMISSION DATE:  06/20/2024, CONSULTATION DATE:  7/17 REFERRING MD:  Lanis  CHIEF COMPLAINT:  post op ICU care    History of Present Illness:  53 year old female recently discharged 7/11 following  right frontal subacute infarct, after presenting w/ cc: HA and HTN, placed on asa and Brillinta. 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 Underwent dx angiogram prior to planned surgery. Presented 7/17 for planned pipeline embolization.   Pertinent  Medical History   recent right frontal subacute infarct, placed on asa and Brillinta, PCOM aneurysm, CKD stage III HTN, DM2, anemia, OSA (mouth guard), MO, prior hysterectomy  HFpEF Significant Hospital Events: Including procedures, antibiotic start and stop dates in addition to other pertinent events   7/17 admitted for elective pipeline embolization for PCOM aneurysm. Post op brought to 4N ICU. Pccm consulted  Interim History / Subjective:  Patient in PACU On clevi 16  Objective    Height 5' 6 (1.676 m), weight 125.2 kg, last menstrual period 04/03/2018.       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  Recent right frontal stroke  Plan Serial neuro checks Cleviprex  for SBP goal per NSG <140 Repeat imaging per NSG Multi-modal pain rx Avoid fever Keep euvolemic Activity restriction per NSG team  Resume asa and brillenta when ok w/ NSG team   H/o HTN, HFpEF and HLD Plan Resume lipitor when able Cleviprex  for sbp goal <140  Home meds:  coreg  25mg  bid, cardiazem CD 360mg  at  bedtime, hydralazine  25mg  tid, micardis 40mg  bid, imdur  24hr, 30mg /d, spironolactone  50mg  bid, resume as appropriate   H/o CKD stage IIIb Plan Trend chems Strict I&O Renal dose meds  H/o diabetes (on insulin ) Plan Ssi  Goal 140-180   Anemia of chronic disease Plan Trend cbc  OSA Plan Did not bring mouthguard from home. Will use cpap qhs  Reactive airway disease -home meds albuterol  and flovent  Plan Pulmicort  bid; prn albuterol   H/o GERD Plan Ppi   Best Practice (right click and Reselect all SmartList Selections daily)   Diet/type: Regular consistency (see orders) DVT prophylaxis SCD Pressure ulcer(s): pressure ulcer assessment deferred  GI prophylaxis: PPI Lines: Arterial Line Foley:  N/A Code Status:  full code Last date of multidisciplinary goals of care discussion [pending]  Labs   CBC: Recent Labs  Lab 06/13/24 2241 06/13/24 2245 06/14/24 0646  WBC  --  6.4 5.5  NEUTROABS  --  3.3  --   HGB 11.6* 10.3* 9.1*  HCT 34.0* 33.8* 29.4*  MCV  --  75.6* 75.0*  PLT  --  211 173    Basic Metabolic Panel: Recent Labs  Lab 06/13/24 2241 06/13/24 2245 06/14/24 0646  NA 142 140 139  K 3.5 3.4* 3.5  CL 107 106 107  CO2  --  24 23  GLUCOSE 89 92 120*  BUN 20 18 17   CREATININE 1.20* 1.08* 0.99  CALCIUM   --  9.6 9.0   GFR: Estimated Creatinine Clearance: 89.9 mL/min (  by C-G formula based on SCr of 0.99 mg/dL). Recent Labs  Lab 06/13/24 2245 06/14/24 0646  WBC 6.4 5.5    Liver Function Tests: Recent Labs  Lab 06/13/24 2245 06/14/24 0646  AST 23 19  ALT 21 19  ALKPHOS 78 66  BILITOT 1.0 0.8  PROT 6.8 5.9*  ALBUMIN 3.8 3.3*   No results for input(s): LIPASE, AMYLASE in the last 168 hours. No results for input(s): AMMONIA in the last 168 hours.  ABG    Component Value Date/Time   HCO3 27.4 04/01/2022 1537   TCO2 25 06/13/2024 2241   O2SAT 99 04/01/2022 1537     Coagulation Profile: Recent Labs  Lab 06/13/24 2245  INR  0.9    Cardiac Enzymes: No results for input(s): CKTOTAL, CKMB, CKMBINDEX, TROPONINI in the last 168 hours.  HbA1C: Hemoglobin A1C  Date/Time Value Ref Range Status  03/14/2019 12:00 AM 5.8  Final  12/12/2018 12:00 AM 10.4  Final   Hgb A1c MFr Bld  Date/Time Value Ref Range Status  06/14/2024 06:46 AM 7.5 (H) 4.8 - 5.6 % Final    Comment:    (NOTE)         Prediabetes: 5.7 - 6.4         Diabetes: >6.4         Glycemic control for adults with diabetes: <7.0   12/22/2019 08:32 AM 6.4 (H) 4.8 - 5.6 % Final    Comment:    (NOTE) Pre diabetes:          5.7%-6.4% Diabetes:              >6.4% Glycemic control for   <7.0% adults with diabetes     CBG: Recent Labs  Lab 06/14/24 0748 06/14/24 1216 06/14/24 1648 06/14/24 1706 06/14/24 1737  GLUCAP 122* 157* 62* 61* 83    Review of Systems:   Review of Systems  Constitutional:  Negative for fever.  Respiratory:  Negative for shortness of breath.   Cardiovascular:  Negative for chest pain.  Gastrointestinal:  Negative for abdominal pain, nausea and vomiting.     Past Medical History:  She,  has a past medical history of Anemia, Diabetes mellitus without complication (HCC), Dyspnea, Hypertension, Obesity, Posterior communicating artery aneurysm, and TIA (transient ischemic attack).   Surgical History:   Past Surgical History:  Procedure Laterality Date   ABDOMINAL HYSTERECTOMY     BREAST SURGERY  08/2011   breast reduction   CYSTOSCOPY  04/12/2018   Procedure: CYSTOSCOPY;  Surgeon: Arloa Lamar SQUIBB, MD;  Location: [facility] ORS;  Service: Gynecology;;   FOOT SURGERY Left    cyst removed   GALLBLADDER SURGERY  09/03/2018   IR ANGIO INTRA EXTRACRAN SEL INTERNAL CAROTID BILAT MOD SED  05/31/2024   IR ANGIO VERTEBRAL SEL VERTEBRAL BILAT MOD SED  05/31/2024   LAPAROSCOPIC HYSTERECTOMY Bilateral 04/12/2018   Procedure: HYSTERECTOMY TOTAL LAPAROSCOPIC BILATERAL SALPINGECTOMY;  Surgeon: Arloa Lamar SQUIBB, MD;  Location:  [facility] ORS;  Service: Gynecology;  Laterality: Bilateral;   TUBAL LIGATION       Social History:   reports that she has never smoked. She has never used smokeless tobacco. She reports that she does not drink alcohol and does not use drugs.   Family History:  Her family history includes Cancer in her father; Diabetes in her brother; Heart attack (age of onset: 10) in her mother; Heart attack (age of onset: 74) in her brother; Heart failure in her mother; Hypertension in her brother  and mother.   Allergies Allergies  Allergen Reactions   Duloxetine Other (See Comments)    Increased somnolence.    Egg-Derived Products Rash   Haemophilus B Polysaccharide Vaccine Rash   Influenza Virus Vaccine Rash and Other (See Comments)    Develops a cold the day after receiving it- can tolerate the shot if it is received at a doctor's office (MUST be egg-free)   Atrovent  Nasal Spray [Ipratropium] Other (See Comments)    Causes nose bleed   Hydrocodone  Nausea Only   Metformin And Related Diarrhea     Home Medications  Prior to Admission medications   Medication Sig Start Date End Date Taking? Authorizing Provider  acetaminophen  (TYLENOL ) 500 MG tablet Take 1,000 mg by mouth every 6 (six) hours as needed for mild pain (pain score 1-3) or headache.    [provider]  albuterol  (ACCUNEB ) 1.25 MG/3ML nebulizer solution Take 1 ampule by nebulization every 6 (six) hours as needed for wheezing or shortness of breath. 04/25/24   [provider]  albuterol  (VENTOLIN  HFA) 108 (90 Base) MCG/ACT inhaler Inhale 2 puffs into the lungs every 6 (six) hours as needed for wheezing or shortness of breath.    [provider]  Ashwagandha 300 MG TABS Take 300 mg by mouth daily.    [provider]  aspirin  81 MG chewable tablet Chew 81 mg by mouth daily. 06/11/24   [provider]  atorvastatin  (LIPITOR) 80 MG tablet Take 80 mg by mouth daily. 05/20/24   [provider]   BAYER ASPIRIN  325 MG tablet Take 325 mg by mouth daily.    [provider]  carvedilol  (COREG ) 25 MG tablet Take 1 tablet (25 mg total) by mouth 2 (two) times daily. 01/03/20 11/03/24  Vannie Reche RAMAN, NP  Cholecalciferol (VITAMIN D3) 1000 units CAPS Take 1,000 Units by mouth daily.    [provider]  diltiazem  (CARDIZEM  CD) 360 MG 24 hr capsule Take 360 mg by mouth at bedtime.    [provider]  fluticasone  (FLONASE ) 50 MCG/ACT nasal spray Place 2 sprays into both nostrils daily. 05/13/24   [provider]  fluticasone  (FLOVENT  HFA) 220 MCG/ACT inhaler Inhale 2 puffs into the lungs 2 (two) times daily. 04/13/24   [provider]  HUMULIN R  U-500 KWIKPEN 500 UNIT/ML KwikPen Inject 60 Units into the skin daily with breakfast.    [provider]  hydrALAZINE  (APRESOLINE ) 25 MG tablet Take 25 mg by mouth 3 (three) times daily. Take two tablets by mouth three time daily.    [provider]  insulin  degludec (TRESIBA ) 200 UNIT/ML FlexTouch Pen Inject 56 Units into the skin at bedtime. Inject 56 units subcutaneously at bedtime    [provider]  Insulin  Disposable Pump (OMNIPOD 5 [id number] INTRO GEN 5) KIT Inject into the skin. 05/17/24   [provider]  isosorbide  mononitrate (IMDUR ) 30 MG 24 hr tablet Take 30 mg by mouth daily.    [provider]  ketoconazole (NIZORAL) 2 % cream Apply 1 Application topically daily. Apply a thin layer to face daily.    [provider]  omeprazole (PRILOSEC) 20 MG capsule Take 20 mg by mouth daily. 06/10/24   [provider]  OZEMPIC, 2 MG/DOSE, 8 MG/3ML SOPN Inject 2 mg into the skin once a week. Inject 2mg  subcutaneously once per week on Friday. 05/28/24   [provider]  spironolactone  (ALDACTONE ) 50 MG tablet Take 50 mg by mouth 2 (two) times  daily.    [provider]  telmisartan (MICARDIS) 40 MG tablet Take 40 mg by mouth 2 (two) times daily.     [provider]  ticagrelor  (BRILINTA ) 90 MG TABS tablet Take 90 mg by mouth 2 (two) times daily. 06/12/24   [provider]  traMADol  (ULTRAM ) 50 MG tablet Take 50 mg by mouth every 6 (six) hours as needed (for pain).    [provider]  zolpidem  (AMBIEN ) 5 MG tablet Take 5 mg by mouth at bedtime as needed for sleep.    [provider]     Critical care time: 45 minutes     JD Emilio DEVONNA [facility] 06/20/2024, 7:37 PM  Please see [email or web address] for pager details.  From 7A-7P if no response, please call [phone]. After hours, please call ELink [phone].

## 2024-06-20 NOTE — Anesthesia Procedure Notes (Signed)
 Arterial Line Insertion Start/End7/17/2025 5:23 PM, 06/20/2024 5:33 PM Performed by: Niels Marien CROME, MD, Claudene Florina Boga, CRNA, anesthesiologist  Patient location: OOR procedure area. Preanesthetic checklist: patient identified, IV checked, site marked, risks and benefits discussed, surgical consent, monitors and equipment checked, pre-op evaluation, timeout performed and anesthesia consent Left, radial was placed Catheter size: 20 G Hand hygiene performed  and maximum sterile barriers used   Attempts: 2 Procedure performed using ultrasound guided technique. Following insertion, dressing applied. Post procedure assessment: normal and unchanged  Patient tolerated the procedure well with no immediate complications.

## 2024-06-20 NOTE — Op Note (Signed)
 ENDOVASCULAR NEUROSURGERY OPERATIVE NOTE   PROCEDURE: Pipeline embolization of right carotid artery aneurysm  OPERATOR:    Dr. Gerldine Maizes, MD  HISTORY:    The patient is a 53 y.o. female initially presenting to the emergency department with hypertensive urgency.  Workup included imaging demonstrating an incidental right posterior communicating artery aneurysm.  Patient further underwent diagnostic cerebral angiogram to further delineate the aneurysm and has elected to proceed with elective pipeline embolization.  The risks, benefits, and alternatives to surgery were all reviewed in detail with the patient.  She has been pretreated with daily aspirin  and twice daily Brilinta .  APPROACH:    The technical aspects of the procedure as well as its potential risks and benefits were reviewed with the patient. These risks included but were not limited bleeding, infection, allergic reaction, damage to organs/vital structures, stroke, non-diagnostic procedure, and the catastrophic outcomes of heart attack, coma, and death. With an understanding of these risks, informed consent was obtained and witnessed.    The patient was placed in the supine position on the angiography table and the skin of right groin prepped in the usual sterile fashion. The procedure was performed under general anesthesia.  A 8- French sheath was introduced in the right common femoral artery using Seldinger technique.  A fluorophase sequence was used to document the sheath position.    HEPARIN :  6000 Units total.     CONTRAST AGENT:  See IR records    FLUOROSCOPY TIME:  See IR records   CATHETER(S) AND WIRE(S):     0.035" glidewire   80 cm 6 [place] NeuronMax guide sheath 125 cm 6 [place] Berenstein select guide catheter 115 cm 058 Cat 5 guide catheter 150 cm Phenom 27 microcatheter Synchro 2 select standard microwire Synchro 2 select support microwire  VESSELS CATHETERIZED:    Right internal carotid  artery Right common femoral  VESSELS STUDIED:    Right internal carotid artery, pre-embolization Right internal carotid artery, during embolization Right internal carotid artery, post embolization Right internal carotid artery, final control  PIPELINE DEVICE USED (MRI COMPATIBLE): Pipeline Flex 3.75 x 12mm Pipeline Flex 5.0 x 20mm  PROCEDURAL NARRATIVE:   The guide sheath was introduced over the select guide catheter and 035 wire. The right internal carotid artery was then selected, and the guide sheath was advanced into the proximal cervical right internal carotid artery. The select catheter was then removed without incident.  Angiogram was taken.  The guide catheter was then introduced over the microcatheter and Synchro standard microwire. The microcatheter was used to select the largest of the M3 branches, and the catalyst guide catheter was then advanced to its final position in the supraclinoid internal carotid artery.the smaller 3.75 x 12 mm pipeline device was then introduced.  Device was then deployed initially with the distal end just proximal to the ICA terminus.  As we deployed the device across the neck of the aneurysm, the distal portion of the device foreshortened, and ended up partially across the neck of the aneurysm.  I therefore attempted to recapture the device however due to the patient's tortuosity, while attempting to recapture the device, the proximal end appeared intussuscepted.  I therefore relaxed the system, taking care to maintain pusher wire access to the lumen of the device.  As this happened, the device was then completely deployed within the carotid artery, thankfully with maintained pusher wire access.  The device appeared to extend from the distal cavernous portion of the carotid, to the supraclinoid partially covering  the neck of the aneurysm.  Utilizing the wire access, I tracked the phenom 27 catheter into the lumen of the deployed device.  I then removed the pusher  wire.  The Synchro support microwire was then introduced and used to gain access to the distal M3 segment again.  I was then able to track the 27 microcatheter into the middle cerebral artery.    At this point, with catheter access to the middle cerebral artery, the catalyst guide catheter was advanced over the 27 catheter in order to bump the proximal end of the device which did foreshorten the proximal end and provided good vessel wall apposition to the carotid.  The 5 was then slowly withdrawn again.  The larger above device measuring 5.0 x 20 mm was then introduced after the microwire was removed.  We then deployed the device in standard fashion with the distal end in the mid M1 segment, and the proximal end ending up in the proximal cavernous segment.    The pusher wire was then recaptured and removed.  After angiogram was taken, the microcatheter was then removed without incident. The guide catheter and sheath were then withdrawn into the distal cervical internal carotid artery, and final control angiogram was taken. The entire catheter construct was then removed synchronously without incident.  INTERPRETATION:    Right internal carotid artery, pre-embolization: Injection demonstrates patency of the internal carotid artery, with normal bifurcation into anterior and middle cerebral arteries. The previously described wide necked posterior communicating artery aneurysm is again identified. Capillary phase is unremarkable. Venous phase is normal.  Right internal carotid, during embolization: Angiograms taken during deployment of the above 2 stents were used to assess vessel wall apposition of the initial smaller stent, as well as positioning of the distal end of the device in relation to the carotid terminus and aneurysm neck.  The initial smaller pipeline device is noted to extend from the ophthalmic segment into the supraclinoid internal carotid artery, only partially covering the aneurysm neck.  No  filling defects or branch occlusions are noted.  Right internal carotid, post-embolization: Post-deployment angiograms taken immediately and a delayed fashion reveal widely patent carotid artery. Pipeline device is widely patent with stable position and good vessel wall apposition throughout the majority of the stent construct.  There is minor mal-apposition across the horizontal cavernous segment however there is significant contrast stasis seen within the aneurysm.  The anterior cerebral artery remains widely patent.  No filling defects are seen.   Right internal carotid artery, final control: Injection demonstrates patency of the internal carotid artery, with normal bifurcation. The distal branches of the anterior cerebral and middle cerebral arteries are normal. Pipeline device is stable, without any filling defects seen. There remains significant contrast stasis within the aneurysm. Capillary phase does not demonstrate any perfusion deficits. Venous phases is unremarkable.  Right femoral:     Normal vessel. No significant atherosclerotic disease. Arterial sheath in adequate position.   DISPOSITION:   Upon completion of the study, the femoral sheath was removed and hemostasis obtained using a 8-Fr Angio-Seal closure device. Good proximal and distal lower extremity pulses were documented upon achievement of hemostasis. The procedure was well tolerated and no early complications were observed.   The patient was extubated and taken to the postanesthesia care unit in stable hemodynamic condition.  IMPRESSION:   1. Successful Pipeline embolization of a right internal carotid artery aneurysm without local thrombotic or distal embolic complication.   Gerldine Maizes, MD [facility]

## 2024-06-21 ENCOUNTER — Encounter (HOSPITAL_COMMUNITY): Payer: Self-pay | Admitting: Neurosurgery

## 2024-06-21 DIAGNOSIS — I671 Cerebral aneurysm, nonruptured: Secondary | ICD-10-CM | POA: Diagnosis not present

## 2024-06-21 LAB — BASIC METABOLIC PANEL WITH GFR
Anion gap: 9 (ref 5–15)
BUN: 19 mg/dL (ref 6–20)
CO2: 21 mmol/L — ABNORMAL LOW (ref 22–32)
Calcium: 8.7 mg/dL — ABNORMAL LOW (ref 8.9–10.3)
Chloride: 105 mmol/L (ref 98–111)
Creatinine, Ser: 0.88 mg/dL (ref 0.44–1.00)
GFR, Estimated: >60 mL/min (ref >60)
Glucose, Bld: 185 mg/dL — ABNORMAL HIGH (ref 70–99)
Potassium: 3.7 mmol/L (ref 3.5–5.1)
Sodium: 135 mmol/L (ref 135–145)

## 2024-06-21 LAB — CBC
HCT: 30 % — ABNORMAL LOW (ref 36.0–46.0)
Hemoglobin: 9.2 g/dL — ABNORMAL LOW (ref 12.0–15.0)
MCH: 23.1 pg — ABNORMAL LOW (ref 26.0–34.0)
MCHC: 30.7 g/dL (ref 30.0–36.0)
MCV: 75.4 fL — ABNORMAL LOW (ref 80.0–100.0)
Platelets: 182 K/uL (ref 150–400)
RBC: 3.98 MIL/uL (ref 3.87–5.11)
RDW: 14.8 % (ref 11.5–15.5)
WBC: 6.4 K/uL (ref 4.0–10.5)
nRBC: 0 % (ref 0.0–0.2)

## 2024-06-21 LAB — PHOSPHORUS: Phosphorus: 3.8 mg/dL (ref 2.5–4.6)

## 2024-06-21 LAB — MAGNESIUM: Magnesium: 1.8 mg/dL (ref 1.7–2.4)

## 2024-06-21 LAB — TSH: TSH: 0.672 u[IU]/mL (ref 0.350–4.500)

## 2024-06-21 LAB — GLUCOSE, CAPILLARY
Glucose-Capillary: 257 mg/dL — ABNORMAL HIGH (ref 70–99)
Glucose-Capillary: 162 mg/dL — ABNORMAL HIGH (ref 70–99)

## 2024-06-21 LAB — T4, FREE: Free T4: 1.01 ng/dL (ref 0.61–1.12)

## 2024-06-21 MED ORDER — ACETAMINOPHEN 325 MG PO TABS: 650.0000 mg | ORAL_TABLET | Freq: Four times a day (QID) | ORAL | Status: AC | PRN

## 2024-06-21 NOTE — Progress Notes (Signed)
 Discharge orders given. Medications explained and educated on. Patient explained she has prescription of Brilinta  at home and will call Dr. Lanis office with questions.

## 2024-06-21 NOTE — Progress Notes (Signed)
  NEUROSURGERY PROGRESS NOTE   Had some bleeding from right groin site last pm, controlled with manual compression. Doing well this am, no HA or visual changes.  EXAM:  BP (!) 95/57 Comment: art line in range  Pulse 76   Temp 98.4 F (36.9 C) (Axillary)   Resp 18   Ht 5' 6 (1.676 m)   Wt 110.2 kg   LMP 04/03/2018   SpO2 99%   BMI 39.21 kg/m   Awake, alert, oriented  Speech fluent, appropriate  CN grossly intact  5/5 BUE/BLE  Right groin site soft, minimal hematoma, mildly tender.  IMPRESSION:  53 y.o. female POD#1 Pipeline embolization RPcom aneurysm, doing well  PLAN: - d/c aline - OOB - d/c home today with continued ASA/Brilinta .   Gerldine Maizes, MD [facility]

## 2024-06-21 NOTE — Discharge Summary (Signed)
 Physician Discharge Summary  Patient ID: Grace Gallagher MRN: [id number] DOB/AGE: 1970/12/09 53 y.o.  Admit date: 06/20/2024 Discharge date: 06/21/2024  Admission Diagnoses:  Right Pcom aneurysm  Discharge Diagnoses:  Same Principal Problem:   Cerebral aneurysm   Discharged Condition: Stable  Hospital Course:  Nevena Rozenberg Herman is a 53 y.o. 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 She had minimal site bleeding. She was at baseline on POD#1, ambulating well, tolerating diet, no pain. She requested d/c home.  Treatments: Surgery - Pipeline embolization right Pcom aneurysm  Discharge Exam: Blood pressure (!) 95/57, pulse 76, temperature 98.4 F (36.9 C), temperature source Axillary, resp. rate 18, height 5' 6 (1.676 m), weight 110.2 kg, last menstrual period 04/03/2018, SpO2 99%. Awake, alert, oriented Speech fluent, appropriate CN grossly intact 5/5 BUE/BLE Groin site c/d/I, minimal hematoma  Disposition: Discharge disposition: 01-Home or Self Care       Discharge Instructions     Call MD for:  redness, tenderness, or signs of infection (pain, swelling, redness, odor or green/yellow discharge around incision site)   Complete by: As directed    Call MD for:  temperature >100.4   Complete by: As directed    Diet - low sodium heart healthy   Complete by: As directed    Discharge instructions   Complete by: As directed    Walk at home as much as possible, at least 4 times / day   Increase activity slowly   Complete by: As directed    Lifting restrictions   Complete by: As directed    No lifting > 10 lbs   May shower / Bathe   Complete by: As directed    48 hours after surgery   May walk up steps   Complete by: As directed    Other Restrictions   Complete by: As directed    No bending/twisting at waist   Remove dressing in 48 hours   Complete by: As directed       Allergies as of 06/21/2024       Reactions    Duloxetine Other (See Comments)   Increased somnolence.    Egg-derived Products Rash   Haemophilus B Polysaccharide Vaccine Rash   Influenza Virus Vaccine Rash, Other (See Comments)   Develops a cold the day after receiving it- can tolerate the shot if it is received at a doctor's office (MUST be egg-free)   Atrovent  Nasal Spray [ipratropium] Other (See Comments)   Causes nose bleed   Hydrocodone  Nausea Only   Metformin And Related Diarrhea        Medication List     TAKE these medications    acetaminophen  500 MG tablet Commonly known as: TYLENOL  Take 1,000 mg by mouth every 6 (six) hours as needed for mild pain (pain score 1-3) or headache. What changed: Another medication with the same name was added. Make sure you understand how and when to take each.   acetaminophen  325 MG tablet Commonly known as: TYLENOL  Take 2 tablets (650 mg total) by mouth every 6 (six) hours as needed for mild pain (pain score 1-3) (temp > 100.5). What changed: You were already taking a medication with the same name, and this prescription was added. Make sure you understand how and when to take each.   albuterol  108 (90 Base) MCG/ACT inhaler Commonly known as: VENTOLIN  HFA Inhale 2 puffs into the lungs every 6 (six) hours as needed for wheezing or shortness of breath.  albuterol  1.25 MG/3ML nebulizer solution Commonly known as: ACCUNEB  Take 1 ampule by nebulization every 6 (six) hours as needed for wheezing or shortness of breath.   Ashwagandha 300 MG Tabs Take 300 mg by mouth daily.   aspirin  81 MG chewable tablet Chew 81 mg by mouth daily. What changed: Another medication with the same name was removed. Continue taking this medication, and follow the directions you see here.   atorvastatin  80 MG tablet Commonly known as: LIPITOR Take 80 mg by mouth daily.   carvedilol  25 MG tablet Commonly known as: COREG  Take 1 tablet (25 mg total) by mouth 2 (two) times daily.   diltiazem  360 MG 24  hr capsule Commonly known as: CARDIZEM  CD Take 360 mg by mouth at bedtime.   fluticasone  220 MCG/ACT inhaler Commonly known as: FLOVENT  HFA Inhale 2 puffs into the lungs 2 (two) times daily.   fluticasone  50 MCG/ACT nasal spray Commonly known as: FLONASE  Place 2 sprays into both nostrils daily.   HumuLIN R  U-500 KwikPen 500 UNIT/ML KwikPen Generic drug: insulin  regular human CONCENTRATED Inject 60 Units into the skin daily with breakfast.   hydrALAZINE  25 MG tablet Commonly known as: APRESOLINE  Take 25 mg by mouth 3 (three) times daily. Take two tablets by mouth three time daily.   insulin  degludec 200 UNIT/ML FlexTouch Pen Commonly known as: TRESIBA  Inject 56 Units into the skin at bedtime. Inject 56 units subcutaneously at bedtime   isosorbide  mononitrate 30 MG 24 hr tablet Commonly known as: IMDUR  Take 30 mg by mouth daily.   ketoconazole 2 % cream Commonly known as: NIZORAL Apply 1 Application topically daily. Apply a thin layer to face daily.   omeprazole 20 MG capsule Commonly known as: PRILOSEC Take 20 mg by mouth daily.   Omnipod 5 DexG7G6 Intro Gen 5 Kit Inject into the skin.   Ozempic (2 MG/DOSE) 8 MG/3ML Sopn Generic drug: Semaglutide (2 MG/DOSE) Inject 2 mg into the skin once a week. Inject 2mg  subcutaneously once per week on Friday.   spironolactone  50 MG tablet Commonly known as: ALDACTONE  Take 50 mg by mouth 2 (two) times daily.   telmisartan 40 MG tablet Commonly known as: MICARDIS Take 40 mg by mouth 2 (two) times daily.   ticagrelor  90 MG Tabs tablet Commonly known as: BRILINTA  Take 90 mg by mouth 2 (two) times daily.   traMADol  50 MG tablet Commonly known as: ULTRAM  Take 50 mg by mouth every 6 (six) hours as needed (for pain).   Vitamin D3 1000 units Caps Take 1,000 Units by mouth daily.   zolpidem  5 MG tablet Commonly known as: AMBIEN  Take 5 mg by mouth at bedtime as needed for sleep.        Follow-up Information      Lanis Pupa, MD Follow up in 3 week(s).   Specialty: Neurosurgery Contact information: [address] [place] [address] [phone]                 Signed: Pupa JAYSON Lanis 06/21/2024, 8:35 AM

## 2024-06-21 NOTE — Anesthesia Postprocedure Evaluation (Signed)
 Anesthesia Post Note  Patient: Grace Gallagher  Procedure(s) Performed: RADIOLOGY WITH ANESTHESIA     Patient location during evaluation: PACU Anesthesia Type: General Level of consciousness: awake and alert Pain management: pain level controlled Vital Signs Assessment: post-procedure vital signs reviewed and stable Respiratory status: spontaneous breathing, nonlabored ventilation, respiratory function stable and patient connected to nasal cannula oxygen Cardiovascular status: blood pressure returned to baseline and stable Postop Assessment: no apparent nausea or vomiting Anesthetic complications: no   No notable events documented.  Last Vitals:  Vitals:   06/21/24 0030 06/21/24 0100  BP:  (!) 104/57  Pulse: 77 75  Resp: 13 13  Temp:    SpO2: 98% 98%    Last Pain:  Vitals:   06/21/24 0000  TempSrc: Oral  PainSc:                  Lynwood MARLA Cornea

## 2024-06-21 NOTE — Progress Notes (Signed)
 Patient had hematoma on right groin site with bleeding upon arrival to the unit. Paged Duwaine Beck, NP. Ordered to apply pressure to groin site. RN removed dressing and applied quick clot. Held pressure for 15 minutes. Bleeding stopped.

## 2024-06-26 ENCOUNTER — Encounter (HOSPITAL_COMMUNITY): Payer: Self-pay

## 2024-06-27 ENCOUNTER — Emergency Department (HOSPITAL_COMMUNITY)

## 2024-06-27 ENCOUNTER — Other Ambulatory Visit (HOSPITAL_COMMUNITY): Payer: Self-pay | Admitting: Neurosurgery

## 2024-06-27 ENCOUNTER — Other Ambulatory Visit: Payer: Self-pay

## 2024-06-27 ENCOUNTER — Observation Stay (HOSPITAL_COMMUNITY)
Admission: EM | Admit: 2024-06-27 | Discharge: 2024-06-30 | Disposition: A | Discharge status: Home / Self Care | Source: Ambulatory Visit | Attending: Neurosurgery | Admitting: Neurosurgery

## 2024-06-27 DIAGNOSIS — M7981 Nontraumatic hematoma of soft tissue: Principal | ICD-10-CM | POA: Insufficient documentation

## 2024-06-27 DIAGNOSIS — G8918 Other acute postprocedural pain: Principal | ICD-10-CM | POA: Insufficient documentation

## 2024-06-27 DIAGNOSIS — I97638 Postprocedural hematoma of a circulatory system organ or structure following other circulatory system procedure: Secondary | ICD-10-CM | POA: Diagnosis not present

## 2024-06-27 DIAGNOSIS — I729 Aneurysm of unspecified site: Secondary | ICD-10-CM

## 2024-06-27 DIAGNOSIS — Z79899 Other long term (current) drug therapy: Secondary | ICD-10-CM | POA: Diagnosis not present

## 2024-06-27 DIAGNOSIS — Z794 Long term (current) use of insulin: Secondary | ICD-10-CM | POA: Diagnosis not present

## 2024-06-27 DIAGNOSIS — S301XXA Contusion of abdominal wall, initial encounter: Secondary | ICD-10-CM | POA: Diagnosis present

## 2024-06-27 DIAGNOSIS — I671 Cerebral aneurysm, nonruptured: Secondary | ICD-10-CM

## 2024-06-27 LAB — COMPREHENSIVE METABOLIC PANEL WITH GFR
ALT: 18 U/L (ref 0–44)
AST: 15 U/L (ref 15–41)
Albumin: 3.6 g/dL (ref 3.5–5.0)
Alkaline Phosphatase: 79 U/L (ref 38–126)
Anion gap: 10 (ref 5–15)
BUN: 23 mg/dL — ABNORMAL HIGH (ref 6–20)
CO2: 22 mmol/L (ref 22–32)
Calcium: 9.1 mg/dL (ref 8.9–10.3)
Chloride: 108 mmol/L (ref 98–111)
Creatinine, Ser: 1.26 mg/dL — ABNORMAL HIGH (ref 0.44–1.00)
GFR, Estimated: 51 mL/min — ABNORMAL LOW (ref >60)
Glucose, Bld: 145 mg/dL — ABNORMAL HIGH (ref 70–99)
Potassium: 3.8 mmol/L (ref 3.5–5.1)
Sodium: 140 mmol/L (ref 135–145)
Total Bilirubin: 1.1 mg/dL (ref 0.0–1.2)
Total Protein: 6.9 g/dL (ref 6.5–8.1)

## 2024-06-27 LAB — CBC WITH DIFFERENTIAL/PLATELET
Abs Immature Granulocytes: 0.04 K/uL (ref 0.00–0.07)
Basophils Absolute: 0 K/uL (ref 0.0–0.1)
Basophils Relative: 0 %
Eosinophils Absolute: 0.2 K/uL (ref 0.0–0.5)
Eosinophils Relative: 3 %
HCT: 32.1 % — ABNORMAL LOW (ref 36.0–46.0)
Hemoglobin: 9.9 g/dL — ABNORMAL LOW (ref 12.0–15.0)
Immature Granulocytes: 1 %
Lymphocytes Relative: 36 %
Lymphs Abs: 2.3 K/uL (ref 0.7–4.0)
MCH: 23.7 pg — ABNORMAL LOW (ref 26.0–34.0)
MCHC: 30.8 g/dL (ref 30.0–36.0)
MCV: 76.8 fL — ABNORMAL LOW (ref 80.0–100.0)
Monocytes Absolute: 0.7 K/uL (ref 0.1–1.0)
Monocytes Relative: 10 %
Neutro Abs: 3.2 K/uL (ref 1.7–7.7)
Neutrophils Relative %: 50 %
Platelets: 239 K/uL (ref 150–400)
RBC: 4.18 MIL/uL (ref 3.87–5.11)
RDW: 15 % (ref 11.5–15.5)
WBC: 6.4 K/uL (ref 4.0–10.5)
nRBC: 0 % (ref 0.0–0.2)

## 2024-06-27 MED ORDER — OXYCODONE-ACETAMINOPHEN 5-325 MG PO TABS
1.0000 | ORAL_TABLET | Freq: Once | ORAL | Status: AC
  Administered 2024-06-27: 1 via ORAL
  Filled 2024-06-27: qty 1

## 2024-06-27 MED ORDER — IOHEXOL 350 MG/ML SOLN
100.0000 mL | Freq: Once | INTRAVENOUS | Status: AC | PRN
  Administered 2024-06-27: 100 mL via INTRAVENOUS

## 2024-06-27 NOTE — ED Provider Notes (Signed)
 [place] [facility]PARTMENT AT [facility] Provider Note   CSN: [id number] Arrival date & time: 06/27/24  1918     Patient presents with: Post-op Problem   Grace Gallagher is a 53 y.o. female.  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 She is here for evaluation of persistent painful swelling at the right inguinal cath site entry site. No fever, bleeding. She reports headache yesterday but not today.   The history is provided by the patient. No language interpreter was used.       Prior to Admission medications   Medication Sig Start Date End Date Taking? Authorizing Provider  acetaminophen  (TYLENOL ) 325 MG tablet Take 2 tablets (650 mg total) by mouth every 6 (six) hours as needed for mild pain (pain score 1-3) (temp > 100.5). 06/21/24   Lanis Pupa, MD  acetaminophen  (TYLENOL ) 500 MG tablet Take 1,000 mg by mouth every 6 (six) hours as needed for mild pain (pain score 1-3) or headache.    [provider]  albuterol  (ACCUNEB ) 1.25 MG/3ML nebulizer solution Take 1 ampule by nebulization every 6 (six) hours as needed for wheezing or shortness of breath. 04/25/24   [provider]  albuterol  (VENTOLIN  HFA) 108 (90 Base) MCG/ACT inhaler Inhale 2 puffs into the lungs every 6 (six) hours as needed for wheezing or shortness of breath.    [provider]  Ashwagandha 300 MG TABS Take 300 mg by mouth daily.    [provider]  aspirin  81 MG chewable tablet Chew 81 mg by mouth daily. 06/11/24   [provider]  atorvastatin  (LIPITOR ) 80 MG tablet Take 80 mg by mouth daily. 05/20/24   [provider]  carvedilol  (COREG ) 25 MG tablet Take 1 tablet (25 mg total) by mouth 2 (two) times daily. 01/03/20 11/03/24  Vannie Reche RAMAN, NP  Cholecalciferol (VITAMIN D3) 1000 units CAPS Take 1,000 Units by mouth daily.    [provider]  diltiazem  (CARDIZEM  CD) 360 MG 24 hr capsule Take 360 mg by mouth at bedtime.    [provider]  fluticasone  (FLONASE ) 50 MCG/ACT nasal spray Place 2 sprays into both nostrils daily. 05/13/24   [provider]  fluticasone  (FLOVENT  HFA) 220 MCG/ACT inhaler Inhale 2 puffs into the lungs 2 (two) times daily. 04/13/24   [provider]  HUMULIN R  U-500 KWIKPEN 500 UNIT/ML KwikPen Inject 60 Units into the skin daily with breakfast.    [provider]  hydrALAZINE  (APRESOLINE ) 25 MG tablet Take 25 mg by mouth 3 (three) times daily. Take two tablets by mouth three time daily.    [provider]  insulin  degludec (TRESIBA ) 200 UNIT/ML FlexTouch Pen Inject 56 Units into the skin at bedtime. Inject 56 units subcutaneously at bedtime    [provider]  Insulin  Disposable Pump (OMNIPOD 5 DEXG7G6 INTRO GEN 5) KIT Inject into the skin. 05/17/24   [provider]  isosorbide  mononitrate (IMDUR ) 30 MG 24 hr tablet Take 30 mg by mouth daily.    [provider]  ketoconazole (NIZORAL) 2 % cream Apply 1 Application topically daily. Apply a thin layer to face daily.    [provider]  omeprazole (PRILOSEC) 20 MG capsule Take 20 mg by mouth daily. 06/10/24   [provider]  OZEMPIC, 2 MG/DOSE,  8 MG/3ML SOPN Inject 2 mg into the skin once a week. Inject 2mg  subcutaneously once per week on Friday. 05/28/24   [provider]  spironolactone  (ALDACTONE ) 50 MG tablet Take 50 mg by mouth 2 (two) times daily.    [provider]  telmisartan (MICARDIS) 40 MG tablet Take 40 mg by mouth 2 (two) times daily.    [provider]  ticagrelor  (BRILINTA ) 90 MG TABS tablet Take 90 mg by mouth 2 (two) times daily. 06/12/24   [provider]  traMADol  (ULTRAM ) 50 MG tablet Take 50 mg by mouth every 6  (six) hours as needed (for pain).    [provider]  zolpidem  (AMBIEN ) 5 MG tablet Take 5 mg by mouth at bedtime as needed for sleep.    [provider]    Allergies: Duloxetine, Egg-derived products, Haemophilus b polysaccharide vaccine, Influenza virus vaccine, Atrovent  nasal spray [ipratropium], Hydrocodone , and Metformin and related    Review of Systems  Updated Vital Signs BP 122/61 (BP Location: Right Arm)   Pulse 83   Temp 98 F (36.7 C) (Oral)   Resp (!) 22   Ht 5' 6 (1.676 m)   Wt 112.5 kg   LMP 04/03/2018   SpO2 98%   BMI 40.03 kg/m   Physical Exam  (all labs ordered are listed, but only abnormal results are displayed) Labs Reviewed  CBC WITH DIFFERENTIAL/PLATELET - Abnormal; Notable for the following components:      Result Value   Hemoglobin 9.9 (*)    HCT 32.1 (*)    MCV 76.8 (*)    MCH 23.7 (*)    All other components within normal limits  COMPREHENSIVE METABOLIC PANEL WITH GFR - Abnormal; Notable for the following components:   Glucose, Bld 145 (*)    BUN 23 (*)    Creatinine, Ser 1.26 (*)    GFR, Estimated 51 (*)    All other components within normal limits    EKG: None  Radiology: No results found.  {Document cardiac monitor, telemetry assessment procedure when appropriate:32947} Procedures   Medications Ordered in the ED  oxyCODONE -acetaminophen  (PERCOCET/ROXICET) 5-325 MG per tablet 1 tablet (has no administration in time range)      {Click here for ABCD2, HEART and other calculators REFRESH Note before signing:1}                              Medical Decision Making Amount and/or Complexity of Data Reviewed Labs: ordered.  Risk Prescription drug management.   ***  {Document critical care time when appropriate  Document review of labs and clinical decision tools ie CHADS2VASC2, etc  Document your independent review of radiology images and any outside records  Document your discussion with family members,  caretakers and with consultants  Document social determinants of health affecting pt's care  Document your decision making why or why not admission, treatments were needed:32947:::1}   Final diagnoses:  None    ED Discharge Orders     None

## 2024-06-27 NOTE — ED Triage Notes (Addendum)
 Pt arrives POV c/o incision site pain and swelling from where she had a pipline embolization of right Rcom aneurysm done on 7/17. Pain has increased along with swelling today. On assessment, no redness or drainage noted but tender to the touch. Pt called Dr. Lanis today about sx and he told her to come here for an ultrasound.  Pt also states she has clear drainage from her nose today that she was concerned about. Denies fever

## 2024-06-28 ENCOUNTER — Observation Stay (HOSPITAL_COMMUNITY)

## 2024-06-28 ENCOUNTER — Inpatient Hospital Stay (HOSPITAL_COMMUNITY)

## 2024-06-28 DIAGNOSIS — I724 Aneurysm of artery of lower extremity: Secondary | ICD-10-CM

## 2024-06-28 DIAGNOSIS — I97638 Postprocedural hematoma of a circulatory system organ or structure following other circulatory system procedure: Secondary | ICD-10-CM | POA: Diagnosis not present

## 2024-06-28 DIAGNOSIS — S301XXA Contusion of abdominal wall, initial encounter: Secondary | ICD-10-CM | POA: Diagnosis present

## 2024-06-28 LAB — GLUCOSE, CAPILLARY
Glucose-Capillary: 106 mg/dL — ABNORMAL HIGH (ref 70–99)
Glucose-Capillary: 144 mg/dL — ABNORMAL HIGH (ref 70–99)
Glucose-Capillary: 106 mg/dL — ABNORMAL HIGH (ref 70–99)

## 2024-06-28 LAB — CBG MONITORING, ED: Glucose-Capillary: 142 mg/dL — ABNORMAL HIGH (ref 70–99)

## 2024-06-28 MED ORDER — ISOSORBIDE MONONITRATE ER 30 MG PO TB24
30.0000 mg | ORAL_TABLET | Freq: Every day | ORAL | Status: DC
  Administered 2024-06-28 – 2024-06-30 (×3): 30 mg via ORAL
  Filled 2024-06-28 (×3): qty 1

## 2024-06-28 MED ORDER — ZOLPIDEM TARTRATE 5 MG PO TABS
5.0000 mg | ORAL_TABLET | Freq: Every evening | ORAL | Status: DC | PRN
  Administered 2024-06-28 – 2024-06-29 (×2): 5 mg via ORAL
  Filled 2024-06-28 (×2): qty 1

## 2024-06-28 MED ORDER — ASPIRIN 81 MG PO CHEW
81.0000 mg | CHEWABLE_TABLET | Freq: Every day | ORAL | Status: DC
  Administered 2024-06-28 – 2024-06-30 (×3): 81 mg via ORAL
  Filled 2024-06-28 (×3): qty 1

## 2024-06-28 MED ORDER — INSULIN ASPART 100 UNIT/ML IJ SOLN: 0.0000 [IU] | Freq: Every day | INTRAMUSCULAR | Status: DC

## 2024-06-28 MED ORDER — SODIUM CHLORIDE 0.9% FLUSH: 3.0000 mL | INTRAVENOUS | Status: DC | PRN

## 2024-06-28 MED ORDER — IRBESARTAN 150 MG PO TABS
150.0000 mg | ORAL_TABLET | Freq: Every day | ORAL | Status: DC
  Administered 2024-06-28 – 2024-06-30 (×3): 150 mg via ORAL
  Filled 2024-06-28 (×3): qty 1

## 2024-06-28 MED ORDER — SODIUM CHLORIDE 0.9% FLUSH
3.0000 mL | Freq: Two times a day (BID) | INTRAVENOUS | Status: DC
  Administered 2024-06-28 – 2024-06-29 (×4): 3 mL via INTRAVENOUS

## 2024-06-28 MED ORDER — BUDESONIDE 0.5 MG/2ML IN SUSP
0.5000 mg | Freq: Two times a day (BID) | RESPIRATORY_TRACT | Status: DC
  Administered 2024-06-28 – 2024-06-29 (×3): 0.5 mg via RESPIRATORY_TRACT
  Filled 2024-06-28 (×5): qty 2

## 2024-06-28 MED ORDER — FENTANYL CITRATE (PF) 100 MCG/2ML IJ SOLN
INTRAMUSCULAR | Status: AC
Start: 2024-06-28 — End: 2024-06-29
  Filled 2024-06-28: qty 2

## 2024-06-28 MED ORDER — THROMBIN FOR PERCUTANEOUS TREATMENT OF PSEUDOANEURYSM (5000UNITS/10ML): 5000.0000 [IU] | Freq: Once | PERCUTANEOUS | Status: DC

## 2024-06-28 MED ORDER — ACETAMINOPHEN 325 MG PO TABS
650.0000 mg | ORAL_TABLET | ORAL | Status: DC | PRN
  Administered 2024-06-28 – 2024-06-29 (×2): 650 mg via ORAL
  Filled 2024-06-28 (×2): qty 2

## 2024-06-28 MED ORDER — ONDANSETRON HCL 4 MG PO TABS: 4.0000 mg | ORAL_TABLET | Freq: Four times a day (QID) | ORAL | Status: DC | PRN

## 2024-06-28 MED ORDER — ATORVASTATIN CALCIUM 80 MG PO TABS
80.0000 mg | ORAL_TABLET | Freq: Every day | ORAL | Status: DC
  Administered 2024-06-28 – 2024-06-30 (×3): 80 mg via ORAL
  Filled 2024-06-28: qty 1
  Filled 2024-06-28: qty 2
  Filled 2024-06-28: qty 1

## 2024-06-28 MED ORDER — INSULIN ASPART 100 UNIT/ML IJ SOLN
0.0000 [IU] | Freq: Three times a day (TID) | INTRAMUSCULAR | Status: DC
  Administered 2024-06-28 – 2024-06-29 (×3): 3 [IU] via SUBCUTANEOUS
  Administered 2024-06-29 – 2024-06-30 (×3): 4 [IU] via SUBCUTANEOUS

## 2024-06-28 MED ORDER — ONDANSETRON HCL 4 MG/2ML IJ SOLN
4.0000 mg | Freq: Four times a day (QID) | INTRAMUSCULAR | Status: DC | PRN
Start: 2024-06-28 — End: 2024-06-30
  Administered 2024-06-28: 4 mg via INTRAVENOUS
  Filled 2024-06-28: qty 2

## 2024-06-28 MED ORDER — SPIRONOLACTONE 25 MG PO TABS
50.0000 mg | ORAL_TABLET | Freq: Two times a day (BID) | ORAL | Status: DC
  Administered 2024-06-28 – 2024-06-30 (×5): 50 mg via ORAL
  Filled 2024-06-28 (×5): qty 2

## 2024-06-28 MED ORDER — PANTOPRAZOLE SODIUM 40 MG PO TBEC
40.0000 mg | DELAYED_RELEASE_TABLET | Freq: Every day | ORAL | Status: DC
  Administered 2024-06-28 – 2024-06-30 (×3): 40 mg via ORAL
  Filled 2024-06-28 (×3): qty 1

## 2024-06-28 MED ORDER — DILTIAZEM HCL ER COATED BEADS 180 MG PO CP24
360.0000 mg | ORAL_CAPSULE | Freq: Every day | ORAL | Status: DC
  Administered 2024-06-28 – 2024-06-29 (×2): 360 mg via ORAL
  Filled 2024-06-28 (×2): qty 2

## 2024-06-28 MED ORDER — SODIUM CHLORIDE 0.9 % IV SOLN: 250.0000 mL | INTRAVENOUS | Status: AC

## 2024-06-28 MED ORDER — ALBUTEROL SULFATE (2.5 MG/3ML) 0.083% IN NEBU: 2.5000 mg | INHALATION_SOLUTION | Freq: Four times a day (QID) | RESPIRATORY_TRACT | Status: DC | PRN

## 2024-06-28 MED ORDER — CARVEDILOL 25 MG PO TABS
25.0000 mg | ORAL_TABLET | Freq: Two times a day (BID) | ORAL | Status: DC
  Administered 2024-06-28 – 2024-06-30 (×5): 25 mg via ORAL
  Filled 2024-06-28 (×2): qty 1
  Filled 2024-06-28: qty 2
  Filled 2024-06-28 (×2): qty 1

## 2024-06-28 MED ORDER — TRAMADOL HCL 50 MG PO TABS
50.0000 mg | ORAL_TABLET | Freq: Four times a day (QID) | ORAL | Status: DC | PRN
  Administered 2024-06-30: 50 mg via ORAL
  Filled 2024-06-28: qty 1

## 2024-06-28 MED ORDER — ACETAMINOPHEN 650 MG RE SUPP: 650.0000 mg | RECTAL | Status: DC | PRN

## 2024-06-28 MED ORDER — TICAGRELOR 90 MG PO TABS
90.0000 mg | ORAL_TABLET | Freq: Two times a day (BID) | ORAL | Status: DC
  Administered 2024-06-28 – 2024-06-30 (×5): 90 mg via ORAL
  Filled 2024-06-28 (×5): qty 1

## 2024-06-28 MED ORDER — ALBUTEROL SULFATE HFA 108 (90 BASE) MCG/ACT IN AERS: 2.0000 | INHALATION_SPRAY | Freq: Four times a day (QID) | RESPIRATORY_TRACT | Status: DC | PRN

## 2024-06-28 MED ORDER — HYDRALAZINE HCL 25 MG PO TABS
25.0000 mg | ORAL_TABLET | Freq: Three times a day (TID) | ORAL | Status: DC
  Administered 2024-06-28 – 2024-06-30 (×7): 25 mg via ORAL
  Filled 2024-06-28 (×7): qty 1

## 2024-06-28 NOTE — ED Notes (Signed)
 CCMD notified. Pt is on monitor.

## 2024-06-28 NOTE — Progress Notes (Signed)
 Duplex imaging reviewed from the pseudoaneurysm study.  Both myself and Dr. 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  There appears to be some thrombus here.  Will post for possible thrombin injection in the Cath Lab.  Lonni DOROTHA Gaskins, MD [facility] Office: [phone]   Lonni JINNY Gaskins

## 2024-06-28 NOTE — ED Notes (Signed)
 Pt transported to vascular.

## 2024-06-28 NOTE — Progress Notes (Signed)
 Patient was brought to the Cath Lab to further define the pseudoaneurysm with plans for thrombin injection. Upon ultrasound evaluation, there was no flow in the pseudoaneurysm sac.  It appears to have thrombosed.  This was confirmed with 2 ultrasonographer's as well as 2 surgeons.  No plan for from an injection at this time.  Patient can be discharged home at the discretion of neurosurgery.  Fonda FORBES Rim MD

## 2024-06-28 NOTE — Progress Notes (Signed)
 Patient brought to 4E from ED. VSS. Telemetry box applied, CCMD notified. Patient oriented to room and staff. Call bell in reach. Sister and family present.  Maddyx Vallie L Jillienne Egner, RN

## 2024-06-28 NOTE — Consult Note (Signed)
 Hospital Consult    Reason for Consult:  Right groin pseudoaneurysm Referring Physician:  ED MRN #:  [id number]  History of Present Illness: This is a 53 y.o. female with history of hypertension, diabetes, posterior communicating artery aneurysm that vascular surgery has been consulted with concern for pseudoaneurysm right groin.  Patient underwent right common femoral access for pipeline embolization of her aneurysm on 06/20/2024 by Dr. Lanis.  Came to the ED got a CT with concern for ongoing extravasation from the right common femoral artery.  Hemoglobin 9.9.  Hemodynamic stable.  States pain in the groin since procedure.  Past Medical History:  Diagnosis Date   Anemia    Diabetes mellitus without complication (HCC)    Dyspnea    Hypertension    Obesity    Posterior communicating artery aneurysm    TIA (transient ischemic attack)    pt states she was told that she had 2 mini strokes- during recent hospitalization 06/2024)    Past Surgical History:  Procedure Laterality Date   ABDOMINAL HYSTERECTOMY     BREAST SURGERY  08/2011   breast reduction   CYSTOSCOPY  04/12/2018   Procedure: CYSTOSCOPY;  Surgeon: Arloa Lamar SQUIBB, MD;  Location: [facility] ORS;  Service: Gynecology;;   FOOT SURGERY Left    cyst removed   GALLBLADDER SURGERY  09/03/2018   IR ANGIO INTRA EXTRACRAN SEL INTERNAL CAROTID BILAT MOD SED  05/31/2024   IR ANGIO VERTEBRAL SEL VERTEBRAL BILAT MOD SED  05/31/2024   IR ANGIOGRAM FOLLOW UP STUDY  06/20/2024   IR ANGIOGRAM FOLLOW UP STUDY  06/20/2024   IR INTRA CRAN STENT  06/20/2024   IR NEURO EACH ADD'L AFTER BASIC UNI RIGHT (MS)  06/20/2024   IR TRANSCATH/EMBOLIZ  06/20/2024   IR US  GUIDE VASC ACCESS RIGHT  06/20/2024   LAPAROSCOPIC HYSTERECTOMY Bilateral 04/12/2018   Procedure: HYSTERECTOMY TOTAL LAPAROSCOPIC BILATERAL SALPINGECTOMY;  Surgeon: Arloa Lamar SQUIBB, MD;  Location: [facility] ORS;  Service: Gynecology;  Laterality: Bilateral;   RADIOLOGY WITH ANESTHESIA N/A 06/20/2024    Procedure: RADIOLOGY WITH ANESTHESIA;  Surgeon: Lanis Pupa, MD;  Location: [facility] OR;  Service: Radiology;  Laterality: N/A;   TUBAL LIGATION      Allergies  Allergen Reactions   Duloxetine Other (See Comments)    Increased somnolence.    Egg-Derived Products Rash   Haemophilus B Polysaccharide Vaccine Rash   Influenza Virus Vaccine Rash and Other (See Comments)    Develops a cold the day after receiving it- can tolerate the shot if it is received at a doctor's office (MUST be egg-free)   Atrovent  Nasal Spray [Ipratropium] Other (See Comments)    Causes nose bleed   Hydrocodone  Nausea Only   Metformin And Related Diarrhea    Prior to Admission medications   Medication Sig Start Date End Date Taking? Authorizing Provider  acetaminophen  (TYLENOL ) 325 MG tablet Take 2 tablets (650 mg total) by mouth every 6 (six) hours as needed for mild pain (pain score 1-3) (temp > 100.5). 06/21/24   Lanis Pupa, MD  acetaminophen  (TYLENOL ) 500 MG tablet Take 1,000 mg by mouth every 6 (six) hours as needed for mild pain (pain score 1-3) or headache.    [provider]  albuterol  (ACCUNEB ) 1.25 MG/3ML nebulizer solution Take 1 ampule by nebulization every 6 (six) hours as needed for wheezing or shortness of breath. 04/25/24   [provider]  albuterol  (VENTOLIN  HFA) 108 (90 Base) MCG/ACT inhaler Inhale 2 puffs into the lungs every 6 (six)  hours as needed for wheezing or shortness of breath.    [provider]  Ashwagandha 300 MG TABS Take 300 mg by mouth daily.    [provider]  aspirin  81 MG chewable tablet Chew 81 mg by mouth daily. 06/11/24   [provider]  atorvastatin  (LIPITOR ) 80 MG tablet Take 80 mg by mouth daily. 05/20/24   [provider]  carvedilol  (COREG ) 25 MG tablet Take 1 tablet (25 mg total) by mouth 2 (two) times daily. 01/03/20 11/03/24  Vannie Reche RAMAN, NP  Cholecalciferol (VITAMIN D3) 1000 units CAPS Take 1,000 Units by  mouth daily.    [provider]  diltiazem  (CARDIZEM  CD) 360 MG 24 hr capsule Take 360 mg by mouth at bedtime.    [provider]  fluticasone  (FLONASE ) 50 MCG/ACT nasal spray Place 2 sprays into both nostrils daily. 05/13/24   [provider]  fluticasone  (FLOVENT  HFA) 220 MCG/ACT inhaler Inhale 2 puffs into the lungs 2 (two) times daily. 04/13/24   [provider]  HUMULIN R  U-500 KWIKPEN 500 UNIT/ML KwikPen Inject 60 Units into the skin daily with breakfast.    [provider]  hydrALAZINE  (APRESOLINE ) 25 MG tablet Take 25 mg by mouth 3 (three) times daily. Take two tablets by mouth three time daily.    [provider]  insulin  degludec (TRESIBA ) 200 UNIT/ML FlexTouch Pen Inject 56 Units into the skin at bedtime. Inject 56 units subcutaneously at bedtime    [provider]  Insulin  Disposable Pump (OMNIPOD 5 [id number] INTRO GEN 5) KIT Inject into the skin. 05/17/24   [provider]  isosorbide  mononitrate (IMDUR ) 30 MG 24 hr tablet Take 30 mg by mouth daily.    [provider]  ketoconazole (NIZORAL) 2 % cream Apply 1 Application topically daily. Apply a thin layer to face daily.    [provider]  omeprazole (PRILOSEC) 20 MG capsule Take 20 mg by mouth daily. 06/10/24   [provider]  OZEMPIC, 2 MG/DOSE, 8 MG/3ML SOPN Inject 2 mg into the skin once a week. Inject 2mg  subcutaneously once per week on Friday. 05/28/24   [provider]  spironolactone  (ALDACTONE ) 50 MG tablet Take 50 mg by mouth 2 (two) times daily.    [provider]  telmisartan (MICARDIS) 40 MG tablet Take 40 mg by mouth 2 (two) times daily.    [provider]  ticagrelor  (BRILINTA ) 90 MG TABS tablet Take 90 mg by mouth 2 (two) times daily. 06/12/24   [provider]  traMADol  (ULTRAM ) 50 MG tablet Take 50 mg by mouth every 6 (six) hours as needed (for pain).    [provider]  zolpidem   (AMBIEN ) 5 MG tablet Take 5 mg by mouth at bedtime as needed for sleep.    [provider]    Social History   Socioeconomic History   Marital status: Widowed    Spouse name: Not on file   Number of children: Not on file   Years of education: Not on file   Highest education level: Not on file  Occupational History   Occupation: nurse    Employer: [employer]  Tobacco Use   Smoking status: Never   Smokeless tobacco: Never  Vaping Use   Vaping status: Never Used  Substance and Sexual Activity   Alcohol use: No   Drug use: No   Sexual activity: Yes    Birth control/protection: Surgical  Other Topics Concern   Not  on file  Social History Narrative   Two children- 58 and 65 yo live with her.   CNA at [employer].   Social Drivers of Corporate investment banker Strain: Low Risk  (03/14/2024)   Received from [facility]   Overall Financial Resource Strain (CARDIA)    Difficulty of Paying Living Expenses: Not hard at all  Food Insecurity: No Food Insecurity (06/21/2024)   Hunger Vital Sign    Worried About Running Out of Food in the Last Year: Never true    Ran Out of Food in the Last Year: Never true  Transportation Needs: No Transportation Needs (06/21/2024)   PRAPARE - Administrator, Civil Service (Medical): No    Lack of Transportation (Non-Medical): No  Physical Activity: Inactive (11/02/2021)   Received [facility]   Exercise Vital Sign    On average, how many days per week do you engage in moderate to strenuous exercise (like a brisk walk)?: 0 days    On average, how many minutes do you engage in exercise at this level?: 20 min  Stress: No Stress Concern Present (11/02/2021)   Received from [facility] Occup[facility] - Occupational Stress Questionnaire    Feeling of Stress : Not at all  Social Connections: Unknown (04/04/2022)   Received from [facility]   Social Network    Social Network: Not on  file  Intimate Partner Violence: Not At Risk (06/21/2024)   Humiliation, Afraid, Rape, and Kick questionnaire    Fear of Current or Ex-Partner: No    Emotionally Abused: No    Physically Abused: No    Sexually Abused: No     Family History  Problem Relation Age of Onset   Hypertension Mother    Heart failure Mother    Heart attack Mother 63   Cancer Father    Diabetes Brother    Hypertension Brother    Heart attack Brother 53    ROS: [x]  Positive   [ ]  Negative   [ ]  All sytems reviewed and are negative  Cardiovascular: []  chest pain/pressure []  palpitations []  SOB lying flat []  DOE []  pain in legs while walking []  pain in legs at rest []  pain in legs at night []  non-healing ulcers []  hx of DVT []  swelling in legs  Pulmonary: []  productive cough []  asthma/wheezing []  home O2  Neurologic: []  weakness in []  arms []  legs []  numbness in []  arms []  legs []  hx of CVA []  mini stroke [] difficulty speaking or slurred speech []  temporary loss of vision in one eye []  dizziness  Hematologic: []  hx of cancer []  bleeding problems []  problems with blood clotting easily  Endocrine:   []  diabetes []  thyroid  disease  GI []  vomiting blood []  blood in stool  GU: []  CKD/renal failure []  HD--[]  M/W/F or []  T/T/S []  burning with urination []  blood in urine  Psychiatric: []  anxiety []  depression  Musculoskeletal: []  arthritis []  joint pain  Integumentary: []  rashes []  ulcers  Constitutional: []  fever []  chills   Physical Examination  Vitals:   06/28/24 0415 06/28/24 0500  BP: 105/76 110/60  Pulse: 71 69  Resp: 12 11  Temp:    SpO2: 97% 98%   Body mass index is 40.03 kg/m.  General:  NAD Gait: Not observed HENT: WNL, normocephalic Pulmonary: normal non-labored breathing, without Rales, rhonchi,  wheezing Cardiac: regular, without  Murmurs, rubs or gallops Abdomen:  soft, NT/ND Vascular  Exam/Pulses: Right groin hematoma is quite tender Right  DP pulse palpable Musculoskeletal: no muscle wasting or atrophy  Neurologic: A&O X 3; Appropriate Affect ; SENSATION: normal; MOTOR FUNCTION:  moving all extremities equally. Speech is fluent/normal   CBC    Component Value Date/Time   WBC 6.4 06/27/2024 1937   RBC 4.18 06/27/2024 1937   HGB 9.9 (L) 06/27/2024 1937   HGB 10.3 (L) 06/03/2014 1856   HCT 32.1 (L) 06/27/2024 1937   HCT 34.2 (L) 06/03/2014 1856   PLT 239 06/27/2024 1937   PLT 245 06/03/2014 1856   MCV 76.8 (L) 06/27/2024 1937   MCV 72 (L) 06/03/2014 1856   MCH 23.7 (L) 06/27/2024 1937   MCHC 30.8 06/27/2024 1937   RDW 15.0 06/27/2024 1937   RDW 15.2 (H) 06/03/2014 1856   LYMPHSABS 2.3 06/27/2024 1937   MONOABS 0.7 06/27/2024 1937   EOSABS 0.2 06/27/2024 1937   BASOSABS 0.0 06/27/2024 1937    BMET    Component Value Date/Time   NA 140 06/27/2024 1937   NA 140 06/03/2014 1856   K 3.8 06/27/2024 1937   K 3.5 06/03/2014 1856   CL 108 06/27/2024 1937   CL 106 06/03/2014 1856   CO2 22 06/27/2024 1937   CO2 28 06/03/2014 1856   GLUCOSE 145 (H) 06/27/2024 1937   GLUCOSE 73 06/03/2014 1856   BUN 23 (H) 06/27/2024 1937   BUN 10 06/03/2014 1856   CREATININE 1.26 (H) 06/27/2024 1937   CREATININE 0.85 06/03/2014 1856   CREATININE 0.72 05/30/2014 1553   CALCIUM  9.1 06/27/2024 1937   CALCIUM  8.5 06/03/2014 1856   GFRNONAA 51 (L) 06/27/2024 1937   GFRNONAA >60 06/03/2014 1856   GFRAA >60 01/27/2020 1650   GFRAA >60 06/03/2014 1856    COAGS: Lab Results  Component Value Date   INR 0.9 06/13/2024   INR 1.0 05/31/2024   INR 1.07 10/22/2016     Non-Invasive Vascular Imaging:    CTA reviewed with evidence of right groin hematoma possible pseudoaneurysm with extravasation from the common femoral artery   ASSESSMENT/PLAN: This is a 53 y.o. female   with history of hypertension, diabetes, posterior communicating artery aneurysm that vascular surgery has been consulted with concern for pseudoaneurysm right  groin.  Patient underwent right common femoral access for pipeline embolization of her aneurysm on 06/20/2024 by Dr. Lanis.    Reviewed her CTA.  Needs stat right groin pseudoaneurysm study this morning that has been ordered.  NPO.  Right groin is quite tender and discussed if she has a pseudoaneurysm confirmed by duplex likely will require thrombin injection versus OR pending findings and anatomy of pseudoaneurysm.  Not sure she would tolerate ultrasound compression given the tenderness in her groin.  Right DP palpable and limb well-perfused.  Lonni DOROTHA Gaskins, MD [facility] Office: [phone]  Lonni JINNY Gaskins

## 2024-06-28 NOTE — Progress Notes (Signed)
 VASCULAR LAB    Ultrasound of groin to evaluate for pseudoaneurysm has been performed.  See CV proc for preliminary results.   Malcom Selmer, RVT 06/28/2024, 2:33 PM

## 2024-06-28 NOTE — H&P (Signed)
 HPI:     Patient is a 53 y.o. female who presents to ED w/ c/o swelling and pain of R groin access site. She has a h/o pipeline embolization of R Pcom aneurysm on 06/20/24 by Dr. Lanis and is currently taking Asprin and Brilinta . Her hospital course was uncomplicated and she was dc'd to home on 7/18. She reports that since being discharged to home her R groin access site has continued to bother her with persistent pain and swelling at site. No additional LE swelling. No additional neurologic changes per patient including mental status change, visual change, persistent/severe HA, speech difficulty.     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   Personal history of COVID-19 01/17/2020   Mixed hyperlipidemia 01/03/2020   Lower extremity edema 01/03/2020   COVID-19 virus detected 12/22/2019   Diabetes mellitus type 2 in nonobese [facility])    Hypoxia    Leg swelling 04/26/2019   Morbid obesity (HCC) 04/26/2019   Hyperlipidemia associated with type 2 diabetes mellitus (HCC) 10/25/2018   OSA (obstructive sleep apnea) 10/24/2018   AKI (acute kidney injury) (HCC) 07/25/2018   Elevated bilirubin    Gallstones    Iron  deficiency anemia 05/08/2018   H/O abdominal hysterectomy 05/08/2018   Fibroid 04/05/2018   Family history of early CAD 04/12/2017   Uncontrolled type 2 diabetes mellitus without complication, without long-term current use of insulin  04/12/2017   Lower respiratory infection 07/07/2015   Bacterial vaginosis 07/07/2015   Edema 03/26/2015   Anal itching 10/14/2014   Head or neck swelling, mass, or lump 09/25/2014   Vaginitis and vulvovaginitis 09/10/2014   Dizziness and giddiness 06/02/2014   Other malaise and fatigue 05/30/2014   Acute sinusitis with symptoms > 10 days  04/09/2014   Essential hypertension, malignant 02/04/2014   Shortness of breath 02/04/2014   Allergic rhinitis 01/29/2014   Hypokalemia 01/10/2014   Back pain 01/06/2014   Bilateral lower abdominal cramping 08/29/2013   Menorrhagia 08/29/2013   Encounter for routine gynecological examination 07/01/2013   Headache 03/31/2013   Essential hypertension 02/21/2013   Morbid obesity with BMI of 40.0-44.9, adult (HCC) 02/21/2013   Microcytic anemia 02/21/2013   Past Medical History:  Diagnosis Date   Anemia    Diabetes mellitus without complication (HCC)    Dyspnea    Hypertension    Obesity    Posterior communicating artery aneurysm    TIA (transient ischemic attack)    pt states she was told that she had 2 mini strokes- during recent hospitalization 06/2024)    Past Surgical History:  Procedure Laterality Date   ABDOMINAL HYSTERECTOMY     BREAST SURGERY  08/2011   breast reduction   CYSTOSCOPY  04/12/2018   Procedure: CYSTOSCOPY;  Surgeon: Arloa Lamar SQUIBB, MD;  Location: [facility] ORS;  Service: Gynecology;;   FOOT SURGERY Left    cyst removed   GALLBLADDER SURGERY  09/03/2018   IR ANGIO INTRA EXTRACRAN SEL INTERNAL CAROTID BILAT MOD SED  05/31/2024   IR ANGIO VERTEBRAL SEL VERTEBRAL BILAT MOD SED  05/31/2024   IR ANGIOGRAM FOLLOW UP STUDY  06/20/2024   IR ANGIOGRAM FOLLOW UP STUDY  06/20/2024   IR INTRA CRAN STENT  06/20/2024   IR NEURO EACH ADD'L AFTER BASIC UNI RIGHT (MS)  06/20/2024   IR TRANSCATH/EMBOLIZ  06/20/2024   IR US  GUIDE VASC ACCESS RIGHT  06/20/2024   LAPAROSCOPIC HYSTERECTOMY Bilateral 04/12/2018   Procedure: HYSTERECTOMY TOTAL LAPAROSCOPIC BILATERAL SALPINGECTOMY;  Surgeon: Arloa Lamar SQUIBB, MD;  Location: [facility] ORS;  Service: Gynecology;  Laterality: Bilateral;   RADIOLOGY WITH ANESTHESIA N/A 06/20/2024   Procedure: RADIOLOGY WITH ANESTHESIA;  Surgeon: Lanis Pupa, MD;  Location: [facility] OR;  Service: Radiology;  Laterality: N/A;   TUBAL LIGATION      (Not in a hospital  admission)  Allergies  Allergen Reactions   Duloxetine Other (See Comments)    Increased somnolence.    Egg-Derived Products Rash   Haemophilus B Polysaccharide Vaccine Rash   Influenza Virus Vaccine Rash and Other (See Comments)    Develops a cold the day after receiving it- can tolerate the shot if it is received at a doctor's office (MUST be egg-free)   Atrovent  Nasal Spray [Ipratropium] Other (See Comments)    Causes nose bleed   Hydrocodone  Nausea Only   Metformin And Related Diarrhea    Social History   Tobacco Use   Smoking status: Never   Smokeless tobacco: Never  Substance Use Topics   Alcohol use: No    Family History  Problem Relation Age of Onset   Hypertension Mother    Heart failure Mother    Heart attack Mother 25   Cancer Father    Diabetes Brother    Hypertension Brother    Heart attack Brother 43     Objective:   Patient Vitals for the past 8 hrs:  BP Temp Temp src Pulse Resp SpO2 Height Weight  06/28/24 0215 115/77 -- -- 76 13 100 % -- --  06/28/24 0200 112/71 98 F (36.7 C) Oral 74 15 99 % -- --  06/27/24 2300 132/83 -- -- 83 15 100 % -- --  06/27/24 2245 108/73 -- -- 84 15 98 % -- --  06/27/24 2226 122/61 98 F (36.7 C) Oral 83 (!) 22 98 % -- --  06/27/24 1930 139/78 99.5 F (37.5 C) Oral 89 20 99 % -- --  06/27/24 1928 -- -- -- -- -- -- 5' 6 (1.676 m) 112.5 kg   No intake/output data recorded. No intake/output data recorded.  CT HEAD WO CONTRAST ( ) Result Date: 06/28/2024 CLINICAL DATA:  Headache. EXAM: CT HEAD WITHOUT CONTRAST TECHNIQUE: Contiguous axial images were obtained from the base of the skull through the vertex without intravenous contrast. RADIATION DOSE REDUCTION: This exam was performed according to the departmental dose-optimization program which includes automated exposure control, adjustment of the mA and/or kV according to patient size and/or use of iterative reconstruction technique. COMPARISON:  June 13, 2024 FINDINGS:  Brain: No evidence of acute infarction, hemorrhage, hydrocephalus, extra-axial collection or mass lesion/mass effect. An empty sella is noted. Vascular: No hyperdense vessel or unexpected calcification. Skull: Normal. Negative for fracture or focal lesion. Sinuses/Orbits: There is marked severity left-sided ethmoid sinus and sphenoid sinus mucosal thickening. Other: None. IMPRESSION: 1. No acute intracranial abnormality. 2. Marked severity left-sided ethmoid sinus and sphenoid sinus disease. Electronically Signed   By: Suzen Dials M.D.   On: 06/28/2024 00:08   CT Angio Aortobifemoral W and/or Wo Contrast Result Date: 06/28/2024 EXAM: CTA ABDOMEN AND PELVIS WITH AND WITHOUT CONTRAST AND RUNOFF CTA OF THE LOWER EXTREMITIES WITH CONTRAST 06/27/2024 11:57:50 PM TECHNIQUE: CTA images of the abdomen, pelvis and lower extremities with and without intravenous contrast. Three-dimensional MIP/volume rendered formations were performed. Automated exposure control, iterative reconstruction, and/or weight  based adjustment of the mA/kV was utilized to reduce the radiation dose to as low as reasonably achievable. COMPARISON: None available. CLINICAL HISTORY: Aneurysm, pelvis or lower extremity. 100cc [id number]; c/o incision site pain and swelling from where she had a pipline embolization of right Rcom aneurysm done on 7/17. Pain has increased along with swelling today. On assessment, no redness or drainage noted but tender to the touch. Pt called Dr. Lanis today about sx and he told her to come here for an ultrasound. FINDINGS: VASCULATURE: AORTA: No acute finding. No abdominal aortic aneurysm. No dissection. CELIAC TRUNK: No acute finding. No occlusion or significant stenosis. SUPERIOR MESENTERIC ARTERY: No acute finding. No occlusion or significant stenosis. INFERIOR MESENTERIC ARTERY: No acute finding. No occlusion or significant stenosis. RENAL ARTERIES: No acute finding. No occlusion or significant stenosis. RIGHT  ILIAC ARTERIES: No acute finding. No occlusion or significant stenosis. RIGHT FEMORAL ARTERIES: Suspected active extravasation arising from the right common femoral artery (image 222) with associated small right groin hematoma (image 232). RIGHT POPLITEAL ARTERY: No acute finding. No occlusion or significant stenosis. RIGHT CALF ARTERIES: Patent 3-vessel runoff. LEFT ILIAC ARTERIES: No acute finding. No occlusion or significant stenosis. LEFT FEMORAL ARTERIES: No acute finding. No occlusion or significant stenosis. LEFT POPLITEAL ARTERY: No acute finding. No occlusion or significant stenosis. LEFT CALF ARTERIES: Patent 3-vessel runoff. ABDOMEN AND PELVIS: LOWER CHEST: Visualized portion of the lower chest demonstrates no acute abnormality. LIVER: The liver is unremarkable. GALLBLADDER AND BILE DUCTS: Status post cholecystectomy. No biliary ductal dilatation. SPLEEN: The spleen is unremarkable. PANCREAS: The pancreas is unremarkable. ADRENAL GLANDS: Bilateral adrenal glands demonstrate no acute abnormality. KIDNEYS, URETERS AND BLADDER: No stones in the kidneys or ureters. No hydronephrosis. No evidence of perinephric or periureteral stranding. Urinary bladder is unremarkable. GI AND BOWEL: Stomach and duodenal sweep demonstrate no acute abnormality. There is no bowel obstruction. No abnormal bowel wall thickening or distension. Normal appendix (image 169). REPRODUCTIVE: Status post hysterectomy. PERITONEUM AND RETROPERITONEUM: No ascites or free air. LYMPH NODES: No evidence of lymphadenopathy. BONES AND SOFT TISSUES: Degenerative changes of the visualized thoracolumbar spine. No acute soft tissue abnormality. IMPRESSION: 1. Suspected active extravasation arising from the right common femoral artery with associated small right groin hematoma. 2. Patent 3-vessel runoff bilaterally. Electronically signed by: Pinkie Pebbles MD 06/28/2024 12:03 AM EDT RP Workstation: [id number]   Awake, alert, oriented NAD Speech  fluent, appropriate CN grossly intact 5/5 BUE/BLE R groin site firm, tender to palpation with surrounding ecchymoses. No active bleeding.  BLE DP and PT pulses palpable BLE well perfused with good cap refill, nonedematous   Assessment:   This is a 53 yo female with a history of pipeline embolization of R Pcom aneurysm on 7/17 with persistent R groin hematoma and concern for continued extravasation at site on recent CTA.   Plan:   -Continue Q2H neurovascular checks with bedside Doppler -Elevate RLE -R femoral US  ordered -Admit to vascular progressive  Khriz Liddy CAYLIN Waymond Meador, PA-C

## 2024-06-28 NOTE — Progress Notes (Signed)
 R/O pseudo exam is completed. Timmie Dugue, RVT

## 2024-06-29 LAB — GLUCOSE, CAPILLARY
Glucose-Capillary: 179 mg/dL — ABNORMAL HIGH (ref 70–99)
Glucose-Capillary: 167 mg/dL — ABNORMAL HIGH (ref 70–99)
Glucose-Capillary: 145 mg/dL — ABNORMAL HIGH (ref 70–99)
Glucose-Capillary: 146 mg/dL — ABNORMAL HIGH (ref 70–99)

## 2024-06-29 NOTE — Discharge Instructions (Signed)
 Call if area of catheterization expands Call if any bleeding in groin Call if weakness in leg Call if area becomes tender, red, hot

## 2024-06-29 NOTE — Progress Notes (Signed)
 Patient is requesting to be evaluated by the attending assigned. Paged [phone] awaiting response.

## 2024-06-29 NOTE — Progress Notes (Signed)
 Patient and family feel safety is an issue going home alone. Patient and family would like physical therapy to come prior to discharging. Prior to admission patient had issues with ambulating at home and states she feels more weak. Patient requesting walker. Will notify MD and Child psychotherapist.

## 2024-06-29 NOTE — Discharge Summary (Signed)
 Physician Discharge Summary  Patient ID: Grace Gallagher MRN: [id number] DOB/AGE: August 29, 1971 53 y.o.  Admit date: 06/27/2024 Discharge date: 06/29/2024  Admission Diagnoses:groin hematoma  Discharge Diagnoses:  Principal Problem:   Hematoma of groin   Discharged Condition: good  Hospital Course: Grace Gallagher was admitted and evaluated by the vascular surgeons. Via direct exam, and ultrasound. 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 Will be discharged.  Treatments: as above  Discharge Exam: Blood pressure 104/70, pulse 78, temperature 97.8 F (36.6 C), temperature source Oral, resp. rate 16, height 5' 6 (1.676 m), weight 112.5 kg, last menstrual period 04/03/2018, SpO2 100%. General appearance: alert, appears stated age, and no distress Grace Gallagher was seen by PT and OT and is now comfortable being discharged with outpatient PT. Will arrange Disposition: Discharge disposition: 01-Home or Self Care      * No surgery found *  Allergies as of 06/29/2024       Reactions   Duloxetine Other (See Comments)   Increased somnolence.    Egg-derived Products Other (See Comments)   Only in relation to the flu shot - it needs to be egg free due to developing a cold    Haemophilus B Polysaccharide Vaccine Other (See Comments)   Flu vaccine, develops a bad reaction    Influenza Virus Vaccine Other (See Comments)   Develops a cold the day after receiving it- can tolerate the shot if it is received at a doctor's office (MUST be egg-free)   Atrovent  Nasal Spray [ipratropium] Other (See Comments)   Causes nose bleed   Hydrocodone  Nausea Only   Metformin And Related Diarrhea        Medication List     TAKE these medications    acetaminophen  325 MG tablet Commonly known as: TYLENOL  Take 2 tablets (650 mg total) by mouth every 6 (six) hours as needed for mild pain (pain score 1-3) (temp > 100.5).   albuterol  108 (90 Base) MCG/ACT inhaler Commonly known as: VENTOLIN   HFA Inhale 2 puffs into the lungs every 6 (six) hours as needed for wheezing or shortness of breath.   albuterol  1.25 MG/3ML nebulizer solution Commonly known as: ACCUNEB  Take 1 ampule by nebulization every 6 (six) hours as needed for wheezing or shortness of breath.   Ashwagandha 300 MG Tabs Take 300 mg by mouth daily.   aspirin  81 MG chewable tablet Chew 81 mg by mouth daily.   atorvastatin  80 MG tablet Commonly known as: LIPITOR  Take 80 mg by mouth daily.   carvedilol  25 MG tablet Commonly known as: COREG  Take 1 tablet (25 mg total) by mouth 2 (two) times daily.   Dexcom G7 Sensor Misc Inject 1 Device into the skin as directed.   diltiazem  360 MG 24 hr capsule Commonly known as: CARDIZEM  CD Take 360 mg by mouth at bedtime.   fluticasone  220 MCG/ACT inhaler Commonly known as: FLOVENT  HFA Inhale 2 puffs into the lungs 2 (two) times daily.   HumuLIN R  U-500 KwikPen 500 UNIT/ML KwikPen Generic drug: insulin  regular human CONCENTRATED Inject 60 Units into the skin daily with breakfast.   hydrALAZINE  25 MG tablet Commonly known as: APRESOLINE  Take 25 mg by mouth 3 (three) times daily. Take two tablets by mouth three time daily.   insulin  degludec 200 UNIT/ML FlexTouch Pen Commonly known as: TRESIBA  Inject 56 Units into the skin at bedtime. Inject 56 units subcutaneously at bedtime   isosorbide  mononitrate 30 MG 24 hr tablet Commonly known as: IMDUR  Take 30  mg by mouth daily.   ketoconazole 2 % cream Commonly known as: NIZORAL Apply 1 Application topically daily as needed for irritation.   omeprazole 20 MG capsule Commonly known as: PRILOSEC Take 20 mg by mouth daily.   Omnipod 5 DexG7G6 Intro Gen 5 Kit Inject into the skin.   Ozempic (2 MG/DOSE) 8 MG/3ML Sopn Generic drug: Semaglutide (2 MG/DOSE) Inject 2 mg into the skin once a week. Inject 2mg  subcutaneously once per week on Friday.   spironolactone  50 MG tablet Commonly known as: ALDACTONE  Take 50 mg  by mouth 2 (two) times daily.   telmisartan 40 MG tablet Commonly known as: MICARDIS Take 40 mg by mouth 2 (two) times daily.   ticagrelor  90 MG Tabs tablet Commonly known as: BRILINTA  Take 90 mg by mouth 2 (two) times daily.   traMADol  50 MG tablet Commonly known as: ULTRAM  Take 50 mg by mouth every 6 (six) hours as needed (for pain).   Vitamin D3 1000 units Caps Take 1,000 Units by mouth daily.   zolpidem  5 MG tablet Commonly known as: AMBIEN  Take 5 mg by mouth at bedtime as needed for sleep.        Follow-up Information     Lanis Pupa, MD. Schedule an appointment as soon as possible for a visit in 2 week(s).   Specialty: Neurosurgery Contact information: [address] [place] [address] [phone]                 Signed: Rockey Peru 06/29/2024, 3:39 PM

## 2024-06-30 ENCOUNTER — Encounter (HOSPITAL_COMMUNITY): Payer: Self-pay | Admitting: Neurosurgery

## 2024-06-30 LAB — GLUCOSE, CAPILLARY: Glucose-Capillary: 182 mg/dL — ABNORMAL HIGH (ref 70–99)

## 2024-06-30 NOTE — Evaluation (Addendum)
 Occupational Therapy Evaluation & Discharge Patient Details Name: Grace Gallagher MRN: [id number] DOB: 1971/01/12 Today's Date: 06/30/2024   History of Present Illness   53 y.o. 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     Clinical Impressions Pt admitted based on above, and was seen based on problem list below. PTA pt was independent with ADLs and prn assistance with IADLs. Today pt is mod I for ADLs and functional mobility. Pt reporting recent decline in both visual acuity and diplopia. Vision assessment performed, pt presenting with decreased smoothness of tracking and diplopia in R eye, creating functional deficits in depth perception and bilateral coordination. Pt would benefit from continued follow up in outpatient neuro setting to continue to improve vision and perceptual skills. No further acute OT needs, OT is signing off.        If plan is discharge home, recommend the following:   Assist for transportation;Assistance with cooking/housework     Functional Status Assessment   Patient has not had a recent decline in their functional status     Equipment Recommendations   None recommended by OT      Precautions/Restrictions   Precautions Precautions: Fall Recall of Precautions/Restrictions: Intact Restrictions Weight Bearing Restrictions Per Provider Order: No     Mobility Bed Mobility Overal bed mobility: Modified Independent     General bed mobility comments: Received sitting EOB    Transfers Overall transfer level: Modified independent        Balance Overall balance assessment: Mild deficits observed, not formally tested       ADL  either performed or assessed with clinical judgement   ADL Overall ADL's : Modified independent;At baseline         Vision Baseline Vision/History: 1 Wears glasses Ability to See in Adequate Light: 0 Adequate Patient Visual Report: Diplopia Vision Assessment?: Yes Eye Alignment: Within Functional Limits Ocular Range of Motion: Within Functional Limits Alignment/Gaze Preference: Within Defined Limits Tracking/Visual Pursuits: Decreased smoothness of horizontal tracking Saccades: Decreased speed of saccadic movement Convergence: Within functional limits Visual Fields: No apparent deficits Diplopia Assessment: Disappears with one eye closed;Objects split on top of one another Additional Comments: R eye diplopia, decreased, smoothness of tracking, depth perception, and bil  coordination tasks, decreased bil acuity            Pertinent Vitals/Pain Pain Assessment Pain Assessment: No/denies pain Pain Intervention(s): Monitored during session     Extremity/Trunk Assessment Upper Extremity Assessment Upper Extremity Assessment: Generalized weakness   Lower Extremity Assessment Lower Extremity Assessment: Defer to PT evaluation   Cervical / Trunk Assessment Cervical / Trunk Assessment: Normal   Communication Communication Communication: No apparent difficulties   Cognition Arousal: Alert Behavior During Therapy: Flat affect Cognition: No apparent impairments   OT - Cognition Comments: Overall WFL noted slow processing     Following commands: Intact       Cueing    Cueing Techniques: Verbal cues           Home Living Family/patient expects to be discharged to:: Private residence Living Arrangements: Alone Available Help at Discharge: Family Type of Home: House Home Access: Level entry     Home Layout: One level     Bathroom Shower/Tub: Tub/shower unit  Bathroom Toilet: Standard     Home Equipment: Cane - single point   Additional Comments:  multiple family members in and out      Prior Functioning/Environment Prior Level of Function : Independent/Modified Independent     Mobility Comments: mod indep with SPC, h/o frequent falls ADLs Comments: mod I for ADLs, children assist with IADLs as needed    OT Problem List: Impaired vision/perception;Decreased activity tolerance   OT Treatment/Interventions: Self-care/ADL training;Therapeutic exercise;Energy conservation;DME and/or AE instruction;Therapeutic activities;Visual/perceptual remediation/compensation;Patient/family education;Balance training      OT Goals(Current goals can be found in the care plan section)   Acute Rehab OT Goals Patient Stated Goal: To get better OT Goal Formulation: All assessment and education complete, DC therapy Time For Goal Achievement: 07/14/24 Potential to Achieve Goals: Good   OT Frequency:  Min 1X/week       AM-PAC OT 6 Clicks Daily Activity     Outcome Measure Help from another person eating meals?: None Help from another person taking care of personal grooming?: None Help from another person toileting, which includes using toliet, bedpan, or urinal?: None Help from another person bathing (including washing, rinsing, drying)?: None Help from another person to put on and taking off regular upper body clothing?: None Help from another person to put on and taking off regular lower body clothing?: None 6 Click Score: 24   End of Session Nurse Communication: Mobility status  Activity Tolerance: Patient tolerated treatment well Patient left: in bed;with call bell/phone within reach  OT Visit Diagnosis: Low vision, both eyes (H54.2)                Time: 9086-9075 OT Time Calculation (min): 11 min Charges:  OT General Charges $OT Visit: 1 Visit OT Evaluation $OT Eval Moderate Complexity: 1 Mod Adrianne BROCKS, OT  Acute Rehabilitation Services Office [phone] Secure chat preferred   Adrianne GORMAN Savers 06/30/2024, 11:07 AM

## 2024-06-30 NOTE — Progress Notes (Signed)
 Taken to the [employer] to wait for rollator  [employer] called about delivery location.

## 2024-06-30 NOTE — Progress Notes (Signed)
 DISCHARGE NOTE HOME Avarie L Say to be discharged Home per MD order. Discussed prescriptions and follow up appointments with the patient. Prescriptions given to patient; medication list explained in detail. Patient verbalized understanding.  Skin clean, dry and intact without evidence of skin break down, no evidence of skin tears noted. IV catheter discontinued intact. Site without signs and symptoms of complications. Dressing and pressure applied. Pt denies pain at the site currently. No complaints noted.  Patient free of lines, drains, and wounds.   An After Visit Summary (AVS) was printed and given to the patient.  Currently waiting for rollator from [employer]. Floor to finish DC.  Peyton SHAUNNA Pepper, RN

## 2024-06-30 NOTE — Plan of Care (Signed)

## 2024-06-30 NOTE — Evaluation (Signed)
 Physical Therapy Evaluation & Discharge Patient Details Name: Grace Gallagher MRN: [id number] DOB: 02/22/1971 Today's Date: 06/30/2024  History of Present Illness  53 y.o. 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   Clinical Impression  Patient evaluated by Physical Therapy with no further acute PT needs identified. PTA, pt mod indep with SPC, lives alone; family live nearby and assist with iADLs as needed. Today, pt performed gait trials with SPC, RW and rollator; preference for rollator for added stability and energy conservation. All education has been completed and the patient has no further questions. Acute PT is signing off. Thank you for this referral.      If plan is discharge home, recommend the following: Assistance with cooking/housework;Assist for transportation;Help with stairs or ramp for entrance   Can travel by private vehicle    Yes    Equipment Recommendations Rollator (4 wheels)  Recommendations for Other Services       Functional Status Assessment       Precautions / Restrictions Precautions Precautions: Fall Recall of Precautions/Restrictions: Intact Restrictions Weight Bearing Restrictions Per Provider Order: No      Mobility  Bed Mobility Overal bed mobility: Modified Independent Bed Mobility: Supine to Sit                Transfers Overall transfer level: Modified independent Equipment used: Straight cane Transfers: Sit to/from Stand                  Ambulation/Gait Ambulation/Gait assistance: Supervision Gait Distance (Feet): 120 Feet Assistive device: Straight cane, Rolling walker (2 wheels), Rollator (4 wheels) Gait  Pattern/deviations: Step-through pattern, Decreased stride length, Trunk flexed, Decreased stance time - right Gait velocity: Decreased     General Gait Details: pt received ambulating mod indep in room with SPC. additional gait trial with RW and rollator. pt endorses RLE fatigue limiting distance, good self-awareness of this. 1x seated rest break on rollator seat  Stairs            Wheelchair Mobility     Tilt Bed    Modified Rankin (Stroke Patients Only)       Balance Overall balance assessment: Needs assistance Sitting-balance support: Feet supported Sitting balance-Leahy Scale: Good     Standing balance support: Single extremity supported, Bilateral upper extremity supported, No upper extremity supported, During functional activity Standing balance-Leahy Scale: Fair Standing balance comment: performing ADL tasks at sink without UE support, can take steps without UE support, though preference for DME                             Pertinent Vitals/Pain Pain Assessment Pain Assessment: Faces Faces Pain Scale: Hurts a little bit Pain Location: RLE Pain Descriptors / Indicators: Sore Pain Intervention(s): Monitored during session    Home Living Family/patient expects to be discharged to:: Private residence Living Arrangements: Alone Available Help at Discharge: Family;Available PRN/intermittently Type of Home: House Home Access: Level entry       Home Layout: One level Home Equipment: Cane - single point Additional Comments: multiple family members in and out    Prior Function Prior Level of Function : Independent/Modified Independent  Mobility Comments: mod indep with SPC, h/o frequent falls. reports improvement in falls over past couple weeks (I take my time) ADLs Comments: mod I for ADLs, children assist with IADLs as needed     Extremity/Trunk Assessment   Upper Extremity Assessment Upper Extremity Assessment: Generalized  weakness    Lower Extremity Assessment Lower Extremity Assessment: Generalized weakness    Cervical / Trunk Assessment Cervical / Trunk Assessment: Normal  Communication   Communication Communication: No apparent difficulties    Cognition Arousal: Alert Behavior During Therapy: Flat affect   PT - Cognitive impairments: No apparent impairments                         Following commands: Intact       Cueing       General Comments General comments (skin integrity, edema, etc.): educ re: role of acute PT, activity recommendations, therex/HEP, potential d/c and DME needs, including recommendation for rollator    Exercises Other Exercises Other Exercises: [facility] HEP handout provided for generalized BLE strengthening, pt reports no questions   Assessment/Plan    PT Assessment All further PT needs can be met in the next venue of care  PT Problem List Decreased strength;Decreased activity tolerance;Decreased balance;Decreased mobility;Decreased knowledge of use of DME;Pain       PT Treatment Interventions      PT Goals (Current goals can be found in the Care Plan section)  Acute Rehab PT Goals PT Goal Formulation: All assessment and education complete, DC therapy    Frequency       Co-evaluation               AM-PAC PT 6 Clicks Mobility  Outcome Measure Help needed turning from your back to your side while in a flat bed without using bedrails?: None Help needed moving from lying on your back to sitting on the side of a flat bed without using bedrails?: None Help needed moving to and from a bed to a chair (including a wheelchair)?: None Help needed standing up from a chair using your arms (e.g., wheelchair or bedside chair)?: None Help needed to walk in hospital room?: A Little Help needed climbing 3-5 steps with a railing? : A Little 6 Click Score: 22    End of Session   Activity Tolerance: Patient tolerated treatment well Patient left: in  bed;with call bell/phone within reach Nurse Communication: Mobility status PT Visit Diagnosis: Other abnormalities of gait and mobility (R26.89);Muscle weakness (generalized) (M62.81);History of falling (Z91.81)    Time: 9247-9181 PT Time Calculation (min) (ACUTE ONLY): 26 min   Charges:   PT Evaluation $PT Eval Low Complexity: 1 Low PT Treatments $Gait Training: 8-22 mins PT General Charges $$ ACUTE PT VISIT: 1 Visit       Darice Almas, PT, DPT Acute Rehabilitation Services  Personal: Secure Chat Rehab Office: [phone]  Darice LITTIE Almas 06/30/2024, 11:16 AM

## 2024-06-30 NOTE — Progress Notes (Signed)
 RNCM ordered RW with 4 wheels for patient.  Jermaine at [facility] contacted with order and confirmation received.  DME to be delivered to patient's room prior to d/c home today.

## 2024-07-21 NOTE — Progress Notes (Deleted)
 PATIENT: Grace Gallagher DOB: 27-May-1971  REASON FOR VISIT: follow up HISTORY FROM: patient PRIMARY NEUROLOGIST: Dr. Rosemarie  No chief complaint on file.    HISTORY OF PRESENT ILLNESS: Today   Grace Gallagher is a 53 y.o. female who has been followed in this office for ***. Returns today for follow-up.   Imaging:    FU: Labs: ECHO: Discharge note Work? OSA?   HISTORY (copied from Hospital)Grace Gallagher is a 53 y.o. 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 Code Stroke  CT head No acute abnormality.  ASPECTS 10.     MRI  Few small patchy foci of diffusion signal abnormality involving the subcortical right frontal lobe as above, consistent with evolving subacute small vessel type infarcts. 2D Echo EF 60 to 65% LDL 46 HgbA1c pending UDS negative VTE prophylaxis - Lovenox   aspirin  81 mg daily and Brilinta  (ticagrelor ) 90 mg bid prior to admission, now on home aspirin  81 mg daily and Brilinta  (ticagrelor ) 90 mg bid for aneurysm repair next week Therapy recommendations: Outpatient PT OT Disposition:  pending    PCOM aneurysm  CTA head and neck 6/9 5 x 6 x 5 mm right PCOM aneurysm  Status post IR with Dr. Lanis showed 6 mm right P-comm aneurysm Scheduled with neurosurgery for intervention next week 7/17 On ASA and brilinta     Hypertensive Urgency  Home meds: Carvedilol  25 mg, Cardizem  360 mg,  hydralazine  25 mg, Micardis 40 mg, Imdur  30 mg, spironolactone  50 mg Reported low 79/56 at home but improved after spaced out BP meds Long-term BP goal normotensive   Hyperlipidemia Home meds: Atorvastatin  80 mg,  resumed in hospital LDL 46, goal < 70 Continue statin at discharge   Diabetes type II UnControlled Home meds: Tresiba , insulin , Ozempic HgbA1c pending goal < 7.0 CBGs SSI Recommend close follow-up with PCP for better DM control   Other Stroke Risk Factors  ETOH use, alcohol level <15, advised to drink no more than 1 drink(s) a day  Obesity, BMI >/= 30 associated with increased stroke risk, recommend weight loss, diet and exercise as appropriate  OSA   Other Active Problems COPD  REVIEW OF SYSTEMS: Out of a complete 14 system review of symptoms, the patient complains only of the following symptoms, and all other reviewed systems are negative.  ALLERGIES: Allergies  Allergen Reactions   Duloxetine Other (See Comments)    Increased somnolence.    Egg-Derived Products Other (See Comments)    Only in relation to the flu shot - it needs to be egg free due to developing a cold    Haemophilus B Polysaccharide Vaccine Other (See Comments)    Flu vaccine, develops a bad reaction    Influenza Virus Vaccine Other (See Comments)    Develops a cold the day after receiving it- can tolerate the shot if it is received at a doctor's office (MUST be egg-free)  Atrovent  Nasal Spray [Ipratropium] Other (See Comments)    Causes nose bleed   Hydrocodone  Nausea Only   Metformin And Related Diarrhea    HOME MEDICATIONS: Outpatient Medications Prior to Visit  Medication Sig Dispense Refill   acetaminophen  (TYLENOL ) 325 MG tablet Take 2 tablets (650 mg total) by mouth every 6 (six) hours as needed for mild pain (pain score 1-3) (temp > 100.5).     albuterol  (ACCUNEB ) 1.25 MG/3ML nebulizer solution Take 1 ampule by nebulization every 6 (six) hours as needed for wheezing or shortness of  breath.     albuterol  (VENTOLIN  HFA) 108 (90 Base) MCG/ACT inhaler Inhale 2 puffs into the lungs every 6 (six) hours as needed for wheezing or shortness of breath.     Ashwagandha 300 MG TABS Take 300 mg by mouth daily.     aspirin  81 MG chewable tablet Chew 81 mg by mouth daily.     atorvastatin  (LIPITOR ) 80 MG tablet Take 80 mg by mouth daily.     carvedilol  (COREG ) 25 MG tablet Take 1 tablet (25 mg total) by mouth 2 (two) times daily. 60 tablet 2   Cholecalciferol (VITAMIN D3) 1000 units CAPS Take 1,000 Units by mouth daily. (Patient not taking: Reported on 06/28/2024)     Continuous Glucose Sensor (DEXCOM G7 SENSOR) MISC Inject 1 Device into the skin as directed.     diltiazem  (CARDIZEM  CD) 360 MG 24 hr capsule Take 360 mg by mouth at bedtime.     fluticasone  (FLOVENT  HFA) 220 MCG/ACT inhaler Inhale 2 puffs into the lungs 2 (two) times daily.     HUMULIN R  U-500 KWIKPEN 500 UNIT/ML KwikPen Inject 60 Units into the skin daily with breakfast.     hydrALAZINE  (APRESOLINE ) 25 MG tablet Take 25 mg by mouth 3 (three) times daily. Take two tablets by mouth three time daily.     insulin  degludec (TRESIBA ) 200 UNIT/ML FlexTouch Pen Inject 56 Units into the skin at bedtime. Inject 56 units subcutaneously at bedtime     Insulin  Disposable Pump (OMNIPOD 5 DEXG7G6 INTRO GEN 5) KIT Inject into the skin. (Patient not taking: Reported on 06/28/2024)     isosorbide  mononitrate (IMDUR ) 30 MG 24 hr tablet Take 30 mg by mouth daily.     ketoconazole (NIZORAL) 2 % cream Apply 1 Application topically daily as needed for irritation.     omeprazole (PRILOSEC) 20 MG capsule Take 20 mg by mouth daily.     OZEMPIC, 2 MG/DOSE, 8 MG/3ML SOPN Inject 2 mg into the skin once a week. Inject 2mg  subcutaneously once per week on Friday.     spironolactone  (ALDACTONE ) 50 MG tablet Take 50 mg by mouth 2 (two) times daily.     telmisartan (MICARDIS) 40 MG tablet Take 40 mg by mouth 2 (two) times daily.     ticagrelor  (BRILINTA ) 90  MG TABS tablet Take 90 mg by mouth 2 (two) times daily.     traMADol  (ULTRAM ) 50 MG tablet Take 50 mg by mouth every 6 (six) hours as needed (for pain).     zolpidem  (AMBIEN ) 5 MG tablet Take 5 mg by mouth at bedtime as needed for sleep.     No facility-administered medications prior to visit.    PAST MEDICAL HISTORY: Past Medical History:  Diagnosis Date   Anemia    Diabetes mellitus without complication (HCC)    Dyspnea    Hypertension    Obesity    Posterior communicating artery aneurysm    TIA (  transient ischemic attack)    pt states she was told that she had 2 mini strokes- during recent hospitalization 06/2024)    PAST SURGICAL HISTORY: Past Surgical History:  Procedure Laterality Date   ABDOMINAL HYSTERECTOMY     BREAST SURGERY  08/2011   breast reduction   CYSTOSCOPY  04/12/2018   Procedure: CYSTOSCOPY;  Surgeon: Arloa Lamar SQUIBB, MD;  Location: [facility] ORS;  Service: Gynecology;;   FOOT SURGERY Left    cyst removed   GALLBLADDER SURGERY  09/03/2018   IR ANGIO INTRA EXTRACRAN SEL INTERNAL CAROTID BILAT MOD SED  05/31/2024   IR ANGIO VERTEBRAL SEL VERTEBRAL BILAT MOD SED  05/31/2024   IR ANGIOGRAM FOLLOW UP STUDY  06/20/2024   IR ANGIOGRAM FOLLOW UP STUDY  06/20/2024   IR INTRA CRAN STENT  06/20/2024   IR NEURO EACH ADD'L AFTER BASIC UNI RIGHT (MS)  06/20/2024   IR TRANSCATH/EMBOLIZ  06/20/2024   IR US  GUIDE VASC ACCESS RIGHT  06/20/2024   LAPAROSCOPIC HYSTERECTOMY Bilateral 04/12/2018   Procedure: HYSTERECTOMY TOTAL LAPAROSCOPIC BILATERAL SALPINGECTOMY;  Surgeon: Arloa Lamar SQUIBB, MD;  Location: [facility] ORS;  Service: Gynecology;  Laterality: Bilateral;   RADIOLOGY WITH ANESTHESIA N/A 06/20/2024   Procedure: RADIOLOGY WITH ANESTHESIA;  Surgeon: Lanis Pupa, MD;  Location: [facility] OR;  Service: Radiology;  Laterality: N/A;   TUBAL LIGATION      FAMILY HISTORY: Family History  Problem Relation Age of Onset   Hypertension Mother    Heart failure Mother    Heart attack Mother 19    Cancer Father    Diabetes Brother    Hypertension Brother    Heart attack Brother 66    SOCIAL HISTORY: Social History   Socioeconomic History   Marital status: Widowed    Spouse name: Not on file   Number of children: Not on file   Years of education: Not on file   Highest education level: Not on file  Occupational History   Occupation: nurse    Employer: [employer]  Tobacco Use   Smoking status: Never   Smokeless tobacco: Never  Vaping Use   Vaping status: Never Used  Substance and Sexual Activity   Alcohol use: No   Drug use: No   Sexual activity: Yes    Birth control/protection: Surgical  Other Topics Concern   Not on file  Social History Narrative   Two children- 50 and 40 yo live with her.   CNA at [employer].   Social Drivers of Corporate investment banker Strain: Low Risk  (03/14/2024)   Received from [facility]   Overall Financial Resource Strain (CARDIA)    Difficulty of Paying Living Expenses: Not hard at all  Food Insecurity: No Food Insecurity (06/28/2024)   Hunger Vital Sign    Worried About Running Out of Food in the Last Year: Never true    Ran Out of Food in the Last Year: Never true  Transportation Needs: No Transportation Needs (06/28/2024)   PRAPARE - Administrator, Civil Service (Medical): No    Lack of Transportation (Non-Medical): No  Physical Activity: Inactive (11/02/2021)   Received from [facility]   Exercise Vital Sign    On average, how many days per week do you engage in moderate to strenuous exercise (like a brisk walk)?: 0 days    On average, how many minutes do you engage in exercise at this level?: 20 min  Stress: No Stress Concern Present (11/02/2021)  Received from [facility] - Occupational Stress Questionnaire    Feeling of Stress : Not at all  Social Connections: Unknown (04/04/2022)   Received from [facility]   Social Network    Social Network:  Not on file  Intimate Partner Violence: Not At Risk (06/28/2024)   Humiliation, Afraid, Rape, and Kick questionnaire    Fear of Current or Ex-Partner: No    Emotionally Abused: No    Physically Abused: No    Sexually Abused: No      PHYSICAL EXAM  There were no vitals filed for this visit. There is no height or weight on file to calculate BMI.  Generalized: Well developed, in no acute distress   Neurological examination  Mentation: Alert oriented to time, place, history taking. Follows all commands speech and language fluent Cranial nerve II-XII: Pupils were equal round reactive to light. Extraocular movements were full, visual field were full on confrontational test. Facial sensation and strength were normal. Uvula tongue midline. Head turning and shoulder shrug  were normal and symmetric. Motor: The motor testing reveals 5 over 5 strength of all 4 extremities. Good symmetric motor tone is noted throughout.  Sensory: Sensory testing is intact to soft touch on all 4 extremities. No evidence of extinction is noted.  Coordination: Cerebellar testing reveals good finger-nose-finger and heel-to-shin bilaterally.  Gait and station: Gait is normal. Tandem gait is normal. Romberg is negative. No drift is seen.  Reflexes: Deep tendon reflexes are symmetric and normal bilaterally.   DIAGNOSTIC DATA (LABS, IMAGING, TESTING) - I reviewed patient records, labs, notes, testing and imaging myself where available.  Lab Results  Component Value Date   WBC 6.4 06/27/2024   HGB 9.9 (L) 06/27/2024   HCT 32.1 (L) 06/27/2024   MCV 76.8 (L) 06/27/2024   PLT 239 06/27/2024      Component Value Date/Time   NA 140 06/27/2024 1937   NA 140 06/03/2014 1856   K 3.8 06/27/2024 1937   K 3.5 06/03/2014 1856   CL 108 06/27/2024 1937   CL 106 06/03/2014 1856   CO2 22 06/27/2024 1937   CO2 28 06/03/2014 1856   GLUCOSE 145 (H) 06/27/2024 1937   GLUCOSE 73 06/03/2014 1856   BUN 23 (H) 06/27/2024 1937    BUN 10 06/03/2014 1856   CREATININE 1.26 (H) 06/27/2024 1937   CREATININE 0.85 06/03/2014 1856   CREATININE 0.72 05/30/2014 1553   CALCIUM  9.1 06/27/2024 1937   CALCIUM  8.5 06/03/2014 1856   PROT 6.9 06/27/2024 1937   PROT 7.3 06/03/2014 1856   ALBUMIN 3.6 06/27/2024 1937   ALBUMIN 3.4 06/03/2014 1856   AST 15 06/27/2024 1937   AST 24 06/03/2014 1856   ALT 18 06/27/2024 1937   ALT 25 06/03/2014 1856   ALKPHOS 79 06/27/2024 1937   ALKPHOS 55 06/03/2014 1856   BILITOT 1.1 06/27/2024 1937   BILITOT 0.4 06/03/2014 1856   GFRNONAA 51 (L) 06/27/2024 1937   GFRNONAA >60 06/03/2014 1856   GFRAA >60 01/27/2020 1650   GFRAA >60 06/03/2014 1856   Lab Results  Component Value Date   CHOL 101 06/14/2024   HDL 38 (L) 06/14/2024   LDLCALC 46 06/14/2024   TRIG 87 06/14/2024   CHOLHDL 2.7 06/14/2024   Lab Results  Component Value Date   HGBA1C 7.5 (H) 06/14/2024   Lab Results  Component Value Date   VITAMINB12 392 05/30/2014   Lab Results  Component Value Date  TSH 0.672 06/21/2024      ASSESSMENT AND PLAN 53 y.o. year old female  has a past medical history of Anemia, Diabetes mellitus without complication (HCC), Dyspnea, Hypertension, Obesity, Posterior communicating artery aneurysm, and TIA (transient ischemic attack). here with ***     Continue {anticoagulants:31417}  and ***  for secondary stroke prevention.   Discussed secondary stroke prevention measures and importance of close PCP follow up for aggressive stroke risk factor management. I have gone over the pathophysiology of stroke, warning signs and symptoms, risk factors and their management in some detail with instructions to go to the closest emergency room for symptoms of concern. HTN: BP goal <130/90.  Stable on *** per PCP HLD: LDL goal <70. Recent LDL 46.  II: A1c goal<7.0. Recent A1c 7.5.  Encouraged patient to monitor diet and encouraged exercise FU with our office ***  No orders of the defined types were  placed in this encounter.  No orders of the defined types were placed in this encounter.     Duwaine Russell, MSN, NP-C 07/21/2024, 3:55 PM [facility] [address], [address] [place], [place] [address] [phone]

## 2024-07-22 ENCOUNTER — Inpatient Hospital Stay: Admitting: Adult Health

## 2024-07-22 ENCOUNTER — Encounter: Payer: Self-pay | Admitting: Adult Health

## 2024-11-15 ENCOUNTER — Other Ambulatory Visit: Payer: Self-pay | Admitting: Neurosurgery

## 2024-11-15 DIAGNOSIS — I671 Cerebral aneurysm, nonruptured: Secondary | ICD-10-CM

## 2024-12-20 ENCOUNTER — Other Ambulatory Visit: Payer: Self-pay

## 2024-12-20 ENCOUNTER — Other Ambulatory Visit: Payer: Self-pay | Admitting: Neurosurgery

## 2024-12-20 ENCOUNTER — Ambulatory Visit (HOSPITAL_COMMUNITY)
Admission: RE | Admit: 2024-12-20 | Discharge: 2024-12-20 | Disposition: A | Discharge status: Home / Self Care | Source: Ambulatory Visit | Attending: Neurosurgery | Admitting: Neurosurgery

## 2024-12-20 DIAGNOSIS — I671 Cerebral aneurysm, nonruptured: Secondary | ICD-10-CM

## 2024-12-20 DIAGNOSIS — Z7902 Long term (current) use of antithrombotics/antiplatelets: Secondary | ICD-10-CM | POA: Insufficient documentation

## 2024-12-20 DIAGNOSIS — Z7982 Long term (current) use of aspirin: Secondary | ICD-10-CM | POA: Insufficient documentation

## 2024-12-20 DIAGNOSIS — I720 Aneurysm of carotid artery: Secondary | ICD-10-CM | POA: Insufficient documentation

## 2024-12-20 HISTORY — PX: IR US GUIDE VASC ACCESS RIGHT: IMG2390

## 2024-12-20 HISTORY — PX: IR ANGIO INTRA EXTRACRAN SEL INTERNAL CAROTID BILAT MOD SED: IMG5363

## 2024-12-20 LAB — CBC WITH DIFFERENTIAL/PLATELET
Abs Immature Granulocytes: 0.02 K/uL (ref 0.00–0.07)
Basophils Absolute: 0 K/uL (ref 0.0–0.1)
Basophils Relative: 0 %
Eosinophils Absolute: 0.1 K/uL (ref 0.0–0.5)
Eosinophils Relative: 2 %
HCT: 40.7 % (ref 36.0–46.0)
Hemoglobin: 12.4 g/dL (ref 12.0–15.0)
Immature Granulocytes: 0 %
Lymphocytes Relative: 34 %
Lymphs Abs: 1.9 K/uL (ref 0.7–4.0)
MCH: 22.8 pg — ABNORMAL LOW (ref 26.0–34.0)
MCHC: 30.5 g/dL (ref 30.0–36.0)
MCV: 75 fL — ABNORMAL LOW (ref 80.0–100.0)
Monocytes Absolute: 0.4 K/uL (ref 0.1–1.0)
Monocytes Relative: 7 %
Neutro Abs: 3.2 K/uL (ref 1.7–7.7)
Neutrophils Relative %: 57 %
Platelets: 189 K/uL (ref 150–400)
RBC: 5.43 MIL/uL — ABNORMAL HIGH (ref 3.87–5.11)
RDW: 14.4 % (ref 11.5–15.5)
WBC: 5.6 K/uL (ref 4.0–10.5)
nRBC: 0 % (ref 0.0–0.2)

## 2024-12-20 LAB — BASIC METABOLIC PANEL WITH GFR
Anion gap: 10 (ref 5–15)
BUN: 11 mg/dL (ref 6–20)
CO2: 27 mmol/L (ref 22–32)
Calcium: 9.5 mg/dL (ref 8.9–10.3)
Chloride: 101 mmol/L (ref 98–111)
Creatinine, Ser: 0.85 mg/dL (ref 0.44–1.00)
GFR, Estimated: >60 mL/min
Glucose, Bld: 274 mg/dL — ABNORMAL HIGH (ref 70–99)
Potassium: 4.1 mmol/L (ref 3.5–5.1)
Sodium: 138 mmol/L (ref 135–145)

## 2024-12-20 LAB — PROTIME-INR
INR: 0.9 (ref 0.8–1.2)
Prothrombin Time: 12.9 s (ref 11.4–15.2)

## 2024-12-20 LAB — GLUCOSE, CAPILLARY: Glucose-Capillary: 274 mg/dL — ABNORMAL HIGH (ref 70–99)

## 2024-12-20 LAB — APTT: aPTT: 32 s (ref 24–36)

## 2024-12-20 MED ORDER — FENTANYL CITRATE (PF) 100 MCG/2ML IJ SOLN
INTRAMUSCULAR | Status: AC
  Filled 2024-12-20: qty 2

## 2024-12-20 MED ORDER — VERAPAMIL HCL 2.5 MG/ML IV SOLN: INTRA_ARTERIAL | Status: AC | PRN

## 2024-12-20 MED ORDER — NITROGLYCERIN 1 MG/10 ML FOR IR/CATH LAB
INTRA_ARTERIAL | Status: AC
  Filled 2024-12-20: qty 10

## 2024-12-20 MED ORDER — LIDOCAINE HCL 1 % IJ SOLN
20.0000 mL | Freq: Once | INTRAMUSCULAR | Status: AC
  Administered 2024-12-20: 1 mL via INTRADERMAL

## 2024-12-20 MED ORDER — HYDRALAZINE HCL 20 MG/ML IJ SOLN
INTRAMUSCULAR | Status: AC | PRN
  Administered 2024-12-20: 10 mg via INTRAVENOUS

## 2024-12-20 MED ORDER — CEFAZOLIN SODIUM-DEXTROSE 2-4 GM/100ML-% IV SOLN: 2.0000 g | INTRAVENOUS | Status: DC

## 2024-12-20 MED ORDER — HYDRALAZINE HCL 20 MG/ML IJ SOLN
INTRAMUSCULAR | Status: AC
  Filled 2024-12-20: qty 1

## 2024-12-20 MED ORDER — FENTANYL CITRATE (PF) 100 MCG/2ML IJ SOLN
INTRAMUSCULAR | Status: AC | PRN
  Administered 2024-12-20: 25 ug via INTRAVENOUS

## 2024-12-20 MED ORDER — LIDOCAINE HCL 1 % IJ SOLN
INTRAMUSCULAR | Status: AC
  Filled 2024-12-20: qty 20

## 2024-12-20 MED ORDER — MIDAZOLAM HCL (PF) 2 MG/2ML IJ SOLN
INTRAMUSCULAR | Status: AC | PRN
  Administered 2024-12-20: .5 mg via INTRAVENOUS

## 2024-12-20 MED ORDER — HEPARIN SODIUM (PORCINE) 1000 UNIT/ML IJ SOLN
INTRAMUSCULAR | Status: AC
  Filled 2024-12-20: qty 10

## 2024-12-20 MED ORDER — MIDAZOLAM HCL 2 MG/2ML IJ SOLN
INTRAMUSCULAR | Status: AC
  Filled 2024-12-20: qty 2

## 2024-12-20 MED ORDER — CHLORHEXIDINE GLUCONATE CLOTH 2 % EX PADS: 6.0000 | MEDICATED_PAD | Freq: Once | CUTANEOUS | Status: DC

## 2024-12-20 MED ORDER — HEPARIN SODIUM (PORCINE) 1000 UNIT/ML IJ SOLN
INTRAMUSCULAR | Status: AC | PRN
  Administered 2024-12-20: 3000 [IU] via INTRAVENOUS

## 2024-12-20 MED ORDER — HYDROCODONE-ACETAMINOPHEN 5-325 MG PO TABS: 1.0000 | ORAL_TABLET | ORAL | Status: DC | PRN

## 2024-12-20 MED ORDER — IOHEXOL 300 MG/ML  SOLN
100.0000 mL | Freq: Once | INTRAMUSCULAR | Status: AC | PRN
  Administered 2024-12-20: 30 mL via INTRA_ARTERIAL

## 2024-12-20 MED ORDER — VERAPAMIL HCL 2.5 MG/ML IV SOLN
INTRAVENOUS | Status: AC
  Filled 2024-12-20: qty 2

## 2024-12-20 NOTE — Op Note (Signed)
 " ENDOVASCULAR NEUROSURGERY OPERATIVE NOTE   PROCEDURE: Diagnostic Cerebral Angiogram   SURGEON:   Dr. Gerldine Maizes, MD  HISTORY:   The patient is a 54 y.o. 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  Procedure was complicated by a femoral pseudoaneurysm which thrombosed spontaneously and did not require intervention.  She has otherwise made an excellent neurologic recovery.  She remains on daily aspirin  and Brilinta .  She comes in today for routine short-term angiographic follow-up.  APPROACH:   The technical aspects of the procedure as well as its potential risks and benefits were reviewed with the patient. These risks included but were not limited bleeding, infection, allergic reaction, damage to organs/vital structures, stroke, non-diagnostic procedure, and the catastrophic outcomes of heart attack, coma, and death. With an understanding of these risks, informed consent was obtained and witnessed.    The patient was placed in the supine position on the angiography table and the skin of right wrist and groin prepped in the usual sterile fashion. The procedure was performed under local anesthesia (1%-solution of bicarbonate-bufferred Lidoacaine) and conscious sedation with Versed  and fentanyl  monitored by the in-suite nurse and myself, including non-invasive blood pressure and continuous pulse oxymetry.    5-Fr sheath was placed in the right radial artery using standard Seldinger technique under ultrasound guidance which allowed direct visualization of the micropuncture needle into the right radial artery.   MEDICATION: Heparin : 3000 Units total.   Verapamil : 2.5mg  Nitroglycerin : 100 mcg  CONTRAST AGENT:  See IR records   FLUOROSCOPY TIME:  See IR records    CATHETER(S) AND WIRE(S):    5-French Simmons-2 glidecatheter   0.035 glidewire    VESSELS CATHETERIZED:   Right internal carotid   Left internal  carotid   Right radial  VESSELS STUDIED:   Right internal carotid, head Left internal carotid, head Right radial  PROCEDURAL NARRATIVE:   The 5-Fr terumo glide catheter was advanced over a 0.035 glidewire into the aortic arch. The above vessels were then sequentially catheterized and cervical/cerebral angiograms taken. After review of images, the catheter was removed without incident.    INTERPRETATION:   Right internal carotid, head:   Injection reveals the presence of a widely patent ICA, M1, and A1 segments and their branches.  Pipeline device is seen extending from the cavernous carotid into the right A1 segment.  There is no in-stent stenosis.  The previously described carotid aneurysm does continue to fill, with continued marked stasis within the aneurysm.  The parenchymal and venous phases are unremarkable. The venous sinuses are widely patent.    Left internal carotid, head:   Injection reveals the presence of a widely patent ICA, A1, and M1 segments and their branches. No aneurysms, arteriovenous malformations, or high flow fistulas are visualized.  The anterior communicating artery is noted to be patent with flash filling of the contralateral ACA territory.  The parenchymal and venous phases are unremarkable. The venous sinuses are widely patent.    Right radial:    Normal vessel. No significant atherosclerotic disease. Arterial sheath in adequate position.   DISPOSITION:  Upon completion of the study, the sheath was removed and hemostasis obtained using a Terumo TR band. Good proximal and distal extremity pulses were documented upon achievement of hemostasis. The procedure was well tolerated and no early complications were observed.  The patient was transferred to the recovery area for further care.    IMPRESSION:  1. Continued filling of a right  carotid aneurysm 6 months after treatment with the Pipeline device. No in-stent stenosis is seen.    Gerldine Maizes, MD [facility]   "

## 2024-12-20 NOTE — Discharge Instructions (Signed)

## 2024-12-20 NOTE — Progress Notes (Signed)
 Pt and daughter received discharge instructions, teach back performed. IV removed, no complications. Rt radial site is clean dry intact, site is soft, no signs of bleeding. Pt escorted out via wheelchair to daughter's vehicle.

## 2024-12-20 NOTE — H&P (Addendum)
 "  HPI:     Grace Gallagher is a 54 year old woman I am seeing in follow-up  status post pipeline embolization of a right carotid aneurysm 06/2024, neurologically well but procedure complicated by development of a right femoral pseudoaneurysm which thankfully was thrombosed and did not require any intervention.  No right leg or groin pain.  No swelling of the right groin.  She reports feeling a small lump but is otherwise doing well.     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   Personal history of COVID-19 01/17/2020   Mixed hyperlipidemia 01/03/2020   Lower extremity edema 01/03/2020   COVID-19 virus detected 12/22/2019   Diabetes mellitus type 2 in nonobese [facility])    Hypoxia    Leg swelling 04/26/2019   Morbid obesity (HCC) 04/26/2019   Hyperlipidemia associated with type 2 diabetes mellitus (HCC) 10/25/2018   OSA (obstructive sleep apnea) 10/24/2018   AKI (acute kidney injury) 07/25/2018   Elevated bilirubin    Gallstones    Iron  deficiency anemia 05/08/2018   H/O abdominal hysterectomy 05/08/2018   Fibroid 04/05/2018   Family history of early CAD 04/12/2017   Uncontrolled type 2 diabetes mellitus without complication, without long-term current use of insulin  04/12/2017   Lower respiratory infection 07/07/2015   Bacterial vaginosis 07/07/2015   Edema 03/26/2015   Anal itching 10/14/2014   Head or neck swelling, mass, or lump 09/25/2014   Vaginitis and vulvovaginitis 09/10/2014   Dizziness and giddiness 06/02/2014   Other malaise and fatigue 05/30/2014   Acute sinusitis with symptoms > 10 days 04/09/2014   Essential hypertension, malignant 02/04/2014   Shortness of breath 02/04/2014   Allergic rhinitis 01/29/2014   Hypokalemia 01/10/2014    Back pain 01/06/2014   Bilateral lower abdominal cramping 08/29/2013   Menorrhagia 08/29/2013   Encounter for routine gynecological examination 07/01/2013   Headache 03/31/2013   Essential hypertension 02/21/2013   Morbid obesity with BMI of 40.0-44.9, adult (HCC) 02/21/2013   Microcytic anemia 02/21/2013   Past Medical History:  Diagnosis Date   Anemia    Diabetes mellitus without complication (HCC)    Dyspnea    Hypertension    Obesity    Posterior communicating artery aneurysm    TIA (transient ischemic attack)    pt states she was told that she had 2 mini strokes- during recent hospitalization 06/2024)    Past Surgical History:  Procedure Laterality Date   ABDOMINAL HYSTERECTOMY     BREAST SURGERY  08/2011   breast reduction   CYSTOSCOPY  04/12/2018   Procedure: CYSTOSCOPY;  Surgeon: Arloa Lamar SQUIBB, MD;  Location: [facility] ORS;  Service: Gynecology;;   FOOT SURGERY Left    cyst removed   GALLBLADDER SURGERY  09/03/2018   IR ANGIO INTRA EXTRACRAN SEL INTERNAL CAROTID BILAT MOD SED  05/31/2024   IR ANGIO VERTEBRAL SEL VERTEBRAL BILAT MOD SED  05/31/2024   IR ANGIOGRAM FOLLOW UP STUDY  06/20/2024   IR ANGIOGRAM FOLLOW UP STUDY  06/20/2024   IR INTRA CRAN STENT  06/20/2024   IR NEURO EACH ADD'L AFTER BASIC UNI RIGHT (MS)  06/20/2024   IR TRANSCATH/EMBOLIZ  06/20/2024   IR US  GUIDE VASC ACCESS RIGHT  06/20/2024   LAPAROSCOPIC HYSTERECTOMY Bilateral 04/12/2018   Procedure: HYSTERECTOMY TOTAL  LAPAROSCOPIC BILATERAL SALPINGECTOMY;  Surgeon: Arloa Lamar SQUIBB, MD;  Location: [facility] ORS;  Service: Gynecology;  Laterality: Bilateral;   RADIOLOGY WITH ANESTHESIA N/A 06/20/2024   Procedure: RADIOLOGY WITH ANESTHESIA;  Surgeon: Lanis Pupa, MD;  Location: [facility] OR;  Service: Radiology;  Laterality: N/A;   TUBAL LIGATION      (Not in a hospital admission)  Allergies[1]  Social History   Tobacco Use   Smoking status: Never   Smokeless tobacco: Never  Substance Use Topics   Alcohol use:  No    Family History  Problem Relation Age of Onset   Hypertension Mother    Heart failure Mother    Heart attack Mother 38   Cancer Father    Diabetes Brother    Hypertension Brother    Heart attack Brother 83     Objective:    Awake, alert, oriented Speech fluent, appropriate CN grossly intact 5/5 BUE/BLE   Assessment:   54 year old woman status post pipeline embolization of a right carotid aneurysm 06/2024, neurologically well.  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  She does not have any groin pain or swelling.   Plan:    - we will plan on proceeding w/ routine short-term angiographic follow-up, likely via trans radial approach. Risks, benefits, alternatives d/w patient. She displayed understanding.   Pupa Lanis, MD [facility]      [1]  Allergies Allergen Reactions   Duloxetine Other (See Comments)    Increased somnolence.    Egg Protein-Containing Drug Products Other (See Comments)    Only in relation to the flu shot - it needs to be egg free due to developing a cold    Haemophilus B Polysaccharide Vaccine Other (See Comments)    Flu vaccine, develops a bad reaction    Influenza Virus Vaccine Other (See Comments)    Develops a cold the day after receiving it- can tolerate the shot if it is received at a doctor's office (MUST be egg-free)   Atrovent  Nasal Spray [Ipratropium] Other (See Comments)    Causes nose bleed   Hydrocodone  Nausea Only   Metformin And Related Diarrhea   "

## 2024-12-23 ENCOUNTER — Encounter (HOSPITAL_COMMUNITY): Payer: Self-pay

## 2025-01-09 ENCOUNTER — Emergency Department (HOSPITAL_COMMUNITY)

## 2025-01-09 ENCOUNTER — Emergency Department (HOSPITAL_COMMUNITY)
Admission: EM | Admit: 2025-01-09 | Discharge: 2025-01-10 | Disposition: A | Source: Home / Self Care | Attending: Emergency Medicine | Admitting: Emergency Medicine

## 2025-01-09 ENCOUNTER — Encounter (HOSPITAL_COMMUNITY): Payer: Self-pay

## 2025-01-09 DIAGNOSIS — R112 Nausea with vomiting, unspecified: Secondary | ICD-10-CM

## 2025-01-09 LAB — CBC WITH DIFFERENTIAL/PLATELET
Abs Immature Granulocytes: 0.02 10*3/uL (ref 0.00–0.07)
Basophils Absolute: 0 10*3/uL (ref 0.0–0.1)
Basophils Relative: 0 %
Eosinophils Absolute: 0.1 10*3/uL (ref 0.0–0.5)
Eosinophils Relative: 1 %
HCT: 38.7 % (ref 36.0–46.0)
Hemoglobin: 11.8 g/dL — ABNORMAL LOW (ref 12.0–15.0)
Immature Granulocytes: 0 %
Lymphocytes Relative: 15 %
Lymphs Abs: 1.2 10*3/uL (ref 0.7–4.0)
MCH: 22.9 pg — ABNORMAL LOW (ref 26.0–34.0)
MCHC: 30.5 g/dL (ref 30.0–36.0)
MCV: 75.1 fL — ABNORMAL LOW (ref 80.0–100.0)
Monocytes Absolute: 0.5 10*3/uL (ref 0.1–1.0)
Monocytes Relative: 7 %
Neutro Abs: 6.3 10*3/uL (ref 1.7–7.7)
Neutrophils Relative %: 77 %
Platelets: 177 10*3/uL (ref 150–400)
RBC: 5.15 MIL/uL — ABNORMAL HIGH (ref 3.87–5.11)
RDW: 14.4 % (ref 11.5–15.5)
WBC: 8.2 10*3/uL (ref 4.0–10.5)
nRBC: 0.2 % (ref 0.0–0.2)

## 2025-01-09 LAB — COMPREHENSIVE METABOLIC PANEL WITH GFR
ALT: 21 U/L (ref 0–44)
AST: 24 U/L (ref 15–41)
Albumin: 4.3 g/dL (ref 3.5–5.0)
Alkaline Phosphatase: 120 U/L (ref 38–126)
Anion gap: 13 (ref 5–15)
BUN: 15 mg/dL (ref 6–20)
CO2: 24 mmol/L (ref 22–32)
Calcium: 9.7 mg/dL (ref 8.9–10.3)
Chloride: 101 mmol/L (ref 98–111)
Creatinine, Ser: 0.89 mg/dL (ref 0.44–1.00)
GFR, Estimated: >60 mL/min
Glucose, Bld: 261 mg/dL — ABNORMAL HIGH (ref 70–99)
Potassium: 3.9 mmol/L (ref 3.5–5.1)
Sodium: 139 mmol/L (ref 135–145)
Total Bilirubin: 1 mg/dL (ref 0.0–1.2)
Total Protein: 7.3 g/dL (ref 6.5–8.1)

## 2025-01-09 LAB — LIPASE, BLOOD: Lipase: 65 U/L — ABNORMAL HIGH (ref 11–51)

## 2025-01-09 MED ORDER — IOHEXOL 350 MG/ML SOLN
75.0000 mL | Freq: Once | INTRAVENOUS | Status: AC | PRN
  Administered 2025-01-09: 75 mL via INTRAVENOUS

## 2025-01-09 NOTE — ED Triage Notes (Signed)
 Pt bib [facility] coming from home with CC of nausea/vomiting that began last night. Pt reports generalized abdominal pain that she rates 8/10. Pt denies fevers. Pt reports episodes of urinary incontinence while vomiting. 4mg  zofran  given by EMS. GCS 15.  EMS VS: 176/120 84 pulse 20 RR 97% RA 274 cbg

## 2025-01-09 NOTE — ED Provider Triage Note (Signed)
 Emergency Medicine Provider Triage Evaluation Note  Grace Gallagher , a 54 y.o. female  was evaluated in triage.  Pt complains of abdominal pain and nausea/vomiting that began last evening.  Notes she was nauseous overnight and began having vomiting this morning.  States has been vomiting all day today with no relief.  Notes feels like she could have a reaction to Ozempic but denies any recent increase in dosage.  She does note some incontinence with episodes of vomiting today.  Denies sick contacts or changes in diet.  Review of Systems  Positive: Nausea, vomiting, abdominal pain Negative: Fevers, chest pain, shortness of breath, diarrhea, constipation, dysuria  Physical Exam  BP (!) 180/118   Pulse 86   Temp 98.8 F (37.1 C) (Oral)   Resp 20   LMP 04/03/2018   SpO2 98%  Gen:   Awake, no distress   Resp:  Normal effort  MSK:   Moves extremities without difficulty  Other:  Generalized abdominal pain  Medical Decision Making  Medically screening exam initiated at 5:24 PM.  Appropriate orders placed.  Grace Gallagher was informed that the remainder of the evaluation will be completed by another provider, this initial triage assessment does not replace that evaluation, and the importance of remaining in the ED until their evaluation is complete.     Neysa Thersia RAMAN, [place] 01/09/25 1725

## 2025-01-10 ENCOUNTER — Other Ambulatory Visit: Payer: Self-pay

## 2025-01-10 LAB — URINALYSIS, MICROSCOPIC (REFLEX): Bacteria, UA: NONE SEEN

## 2025-01-10 LAB — URINALYSIS, ROUTINE W REFLEX MICROSCOPIC
Bilirubin Urine: NEGATIVE
Glucose, UA: 250 mg/dL — AB
Hgb urine dipstick: NEGATIVE
Ketones, ur: NEGATIVE mg/dL
Leukocytes,Ua: NEGATIVE
Nitrite: NEGATIVE
Protein, ur: 30 mg/dL — AB
Specific Gravity, Urine: 1.02 (ref 1.005–1.030)
pH: 5.5 (ref 5.0–8.0)

## 2025-01-10 MED ORDER — LACTATED RINGERS IV BOLUS
1000.0000 mL | Freq: Once | INTRAVENOUS | Status: AC
  Administered 2025-01-10: 1000 mL via INTRAVENOUS

## 2025-01-10 MED ORDER — ISOSORBIDE MONONITRATE ER 30 MG PO TB24
30.0000 mg | ORAL_TABLET | Freq: Once | ORAL | Status: AC
  Administered 2025-01-10: 30 mg via ORAL
  Filled 2025-01-10: qty 1

## 2025-01-10 MED ORDER — PANTOPRAZOLE SODIUM 40 MG IV SOLR
40.0000 mg | Freq: Once | INTRAVENOUS | Status: AC
  Administered 2025-01-10: 40 mg via INTRAVENOUS
  Filled 2025-01-10: qty 10

## 2025-01-10 MED ORDER — HYDRALAZINE HCL 25 MG PO TABS
25.0000 mg | ORAL_TABLET | Freq: Once | ORAL | Status: AC
  Administered 2025-01-10: 25 mg via ORAL
  Filled 2025-01-10: qty 1

## 2025-01-10 MED ORDER — PANTOPRAZOLE SODIUM 20 MG PO TBEC: 20.0000 mg | DELAYED_RELEASE_TABLET | Freq: Every day | ORAL | 0 refills | Status: AC

## 2025-01-10 MED ORDER — PROCHLORPERAZINE MALEATE 10 MG PO TABS: 10.0000 mg | ORAL_TABLET | Freq: Two times a day (BID) | ORAL | 0 refills | Status: AC | PRN

## 2025-01-10 MED ORDER — PROCHLORPERAZINE EDISYLATE 10 MG/2ML IJ SOLN
10.0000 mg | Freq: Once | INTRAMUSCULAR | Status: AC
  Administered 2025-01-10: 10 mg via INTRAVENOUS
  Filled 2025-01-10: qty 2

## 2025-01-10 MED ORDER — PROCHLORPERAZINE 25 MG RE SUPP: 25.0000 mg | Freq: Two times a day (BID) | RECTAL | 0 refills | Status: AC | PRN

## 2025-01-10 MED ORDER — ISOSORBIDE MONONITRATE ER 30 MG PO TB24: 30.0000 mg | ORAL_TABLET | Freq: Every day | ORAL | Status: DC

## 2025-01-10 MED ORDER — FENTANYL CITRATE (PF) 50 MCG/ML IJ SOSY
50.0000 ug | PREFILLED_SYRINGE | Freq: Once | INTRAMUSCULAR | Status: AC
  Administered 2025-01-10: 50 ug via INTRAVENOUS
  Filled 2025-01-10: qty 1

## 2025-01-10 NOTE — ED Provider Notes (Signed)
 " [facility] Provider Note   CSN: [id number] Arrival date & time: 01/09/25  1635     Patient presents with: Nausea and Emesis   Grace Gallagher is a 53 y.o. female.   54 year old female who presents to the ER today secondary to nausea and emesis.  Patient states that she woke up Wednesday morning feeling ill.  She had nausea and vomiting throughout the day on Wednesday.  Thursday slowed down but more of the same.  She has trouble taking her medications because of the vomiting.  Started having some abdominal pain that was diffuse upper.   Emesis      Prior to Admission medications  Medication Sig Start Date End Date Taking? Authorizing Provider  pantoprazole  (PROTONIX ) 20 MG tablet Take 1 tablet (20 mg total) by mouth daily. 01/10/25  Yes Eria Lozoya, Selinda, MD  prochlorperazine  (COMPAZINE ) 10 MG tablet Take 1 tablet (10 mg total) by mouth 2 (two) times daily as needed for nausea or vomiting. 01/10/25  Yes Muadh Creasy, Selinda, MD  prochlorperazine  (COMPAZINE ) 25 MG suppository Place 1 suppository (25 mg total) rectally every 12 (twelve) hours as needed for nausea or vomiting. 01/10/25  Yes Kya Mayfield, Selinda, MD  acetaminophen  (TYLENOL ) 325 MG tablet Take 2 tablets (650 mg total) by mouth every 6 (six) hours as needed for mild pain (pain score 1-3) (temp > 100.5). 06/21/24   Lanis Pupa, MD  albuterol  (ACCUNEB ) 1.25 MG/3ML nebulizer solution Take 1 ampule by nebulization every 6 (six) hours as needed for wheezing or shortness of breath. 04/25/24   [provider]  albuterol  (VENTOLIN  HFA) 108 (90 Base) MCG/ACT inhaler Inhale 2 puffs into the lungs every 6 (six) hours as needed for wheezing or shortness of breath.    [provider]  Ashwagandha 300 MG TABS Take 300 mg by mouth daily.    [provider]  aspirin  81 MG chewable tablet Chew 81 mg by mouth daily. 06/11/24   [provider]  atorvastatin  (LIPITOR ) 80 MG tablet Take  80 mg by mouth daily. 05/20/24   [provider]  carvedilol  (COREG ) 25 MG tablet Take 1 tablet (25 mg total) by mouth 2 (two) times daily. 01/03/20 11/03/24  Vannie Reche RAMAN, NP  Cholecalciferol (VITAMIN D3) 1000 units CAPS Take 1,000 Units by mouth daily.    [provider]  Continuous Glucose Sensor (DEXCOM G7 SENSOR) MISC Inject 1 Device into the skin as directed.    [provider]  diltiazem  (CARDIZEM  CD) 360 MG 24 hr capsule Take 360 mg by mouth at bedtime.    [provider]  fluticasone  (FLOVENT  HFA) 220 MCG/ACT inhaler Inhale 2 puffs into the lungs 2 (two) times daily. 04/13/24   [provider]  HUMULIN R  U-500 KWIKPEN 500 UNIT/ML KwikPen Inject 60 Units into the skin daily with breakfast.    [provider]  hydrALAZINE  (APRESOLINE ) 25 MG tablet Take 25 mg by mouth 3 (three) times daily. Take two tablets by mouth three time daily.    [provider]  insulin  degludec (TRESIBA ) 200 UNIT/ML FlexTouch Pen Inject 56 Units into the skin at bedtime. Inject 56 units subcutaneously at bedtime    [provider]  Insulin  Disposable Pump (OMNIPOD 5 [id number] INTRO GEN 5) KIT Inject into the skin. 05/17/24   [provider]  isosorbide  mononitrate (IMDUR ) 30 MG 24 hr tablet Take 30 mg by mouth daily.    [provider]  ketoconazole (NIZORAL) 2 %  cream Apply 1 Application topically daily as needed for irritation.    [provider]  omeprazole (PRILOSEC) 20 MG capsule Take 20 mg by mouth daily. 06/10/24   [provider]  OZEMPIC, 2 MG/DOSE, 8 MG/3ML SOPN Inject 2 mg into the skin once a week. Inject 2mg  subcutaneously once per week on Friday. 05/28/24   [provider]  spironolactone  (ALDACTONE ) 50 MG tablet Take 50 mg by mouth 2 (two) times daily.    [provider]  telmisartan (MICARDIS) 40 MG tablet Take 40 mg by mouth 2 (two) times daily.    [provider]   ticagrelor  (BRILINTA ) 90 MG TABS tablet Take 90 mg by mouth 2 (two) times daily. 06/12/24   [provider]  traMADol  (ULTRAM ) 50 MG tablet Take 50 mg by mouth every 6 (six) hours as needed (for pain).    [provider]  zolpidem  (AMBIEN ) 5 MG tablet Take 5 mg by mouth at bedtime as needed for sleep.    [provider]    Allergies: Duloxetine, Egg protein-containing drug products, Haemophilus b polysaccharide vaccine, Influenza virus vaccine, Atrovent  nasal spray [ipratropium], Hydrocodone , and Metformin and related    Review of Systems  Gastrointestinal:  Positive for vomiting.    Updated Vital Signs BP (!) 148/84   Pulse 84   Temp 98.7 F (37.1 C) (Oral)   Resp 17   LMP 04/03/2018   SpO2 100%   Physical Exam Vitals and nursing note reviewed.  Constitutional:      Appearance: She is well-developed.  HENT:     Head: Normocephalic and atraumatic.  Cardiovascular:     Rate and Rhythm: Normal rate and regular rhythm.  Pulmonary:     Effort: No respiratory distress.     Breath sounds: No stridor.  Abdominal:     General: There is no distension.     Tenderness: There is no abdominal tenderness.  Musculoskeletal:        General: No swelling or tenderness. Normal range of motion.     Cervical back: Normal range of motion.  Skin:    General: Skin is warm and dry.  Neurological:     General: No focal deficit present.     Mental Status: She is alert.     (all labs ordered are listed, but only abnormal results are displayed) Labs Reviewed  LIPASE, BLOOD - Abnormal; Notable for the following components:      Result Value   Lipase 65 (*)    All other components within normal limits  COMPREHENSIVE METABOLIC PANEL WITH GFR - Abnormal; Notable for the following components:   Glucose, Bld 261 (*)    All other components within normal limits  URINALYSIS, ROUTINE W REFLEX MICROSCOPIC - Abnormal; Notable for the following components:   Glucose, UA 250  (*)    Protein, ur 30 (*)    All other components within normal limits  CBC WITH DIFFERENTIAL/PLATELET - Abnormal; Notable for the following components:   RBC 5.15 (*)    Hemoglobin 11.8 (*)    MCV 75.1 (*)    MCH 22.9 (*)    All other components within normal limits  URINALYSIS, MICROSCOPIC (REFLEX)    EKG: None  Radiology: CT ABDOMEN PELVIS W CONTRAST Result Date: 01/09/2025 EXAM: CT ABDOMEN AND PELVIS WITH CONTRAST 01/09/2025 08:06:00 PM TECHNIQUE: CT of the abdomen and pelvis was performed with the administration of 75 mL of iohexol  (OMNIPAQUE ) 350 MG/ML injection. Multiplanar reformatted images are provided for  review. Automated exposure control, iterative reconstruction, and/or weight-based adjustment of the mA/kV was utilized to reduce the radiation dose to as low as reasonably achievable. COMPARISON: 08/07/2023 CLINICAL HISTORY: Acute, nonlocalized abdominal pain. FINDINGS: LOWER CHEST: No acute abnormality. LIVER: Riedel's lobe configuration of the liver. GALLBLADDER AND BILE DUCTS: Status post cholecystectomy. No biliary ductal dilatation. SPLEEN: No acute abnormality. PANCREAS: No acute abnormality. ADRENAL GLANDS: No acute abnormality. KIDNEYS, URETERS AND BLADDER: No stones in the kidneys or ureters. No hydronephrosis. No perinephric or periureteral stranding. Urinary bladder is unremarkable. GI AND BOWEL: Stomach demonstrates no acute abnormality. Normal appendix (image 69). There is no bowel obstruction. PERITONEUM AND RETROPERITONEUM: No ascites. No free air. VASCULATURE: Aorta is normal in caliber. LYMPH NODES: No lymphadenopathy. REPRODUCTIVE ORGANS: Status post hysterectomy. BONES AND SOFT TISSUES: Mild degenerative changes of the visualized thoracolumbar spine. No focal soft tissue abnormality. IMPRESSION: 1. No acute findings in the abdomen or pelvis. Electronically signed by: Pinkie Pebbles MD 01/09/2025 08:12 PM EST RP Workstation: [id number]     Procedures    Medications Ordered in the ED  iohexol  (OMNIPAQUE ) 350 MG/ML injection 75 mL (75 mLs Intravenous Contrast Given 01/09/25 2007)  lactated ringers  bolus 1,000 mL (0 mLs Intravenous Stopped 01/10/25 0324)  prochlorperazine  (COMPAZINE ) injection 10 mg (10 mg Intravenous Given 01/10/25 0152)  fentaNYL  (SUBLIMAZE ) injection 50 mcg (50 mcg Intravenous Given 01/10/25 0152)  pantoprazole  (PROTONIX ) injection 40 mg (40 mg Intravenous Given 01/10/25 0150)  hydrALAZINE  (APRESOLINE ) tablet 25 mg (25 mg Oral Given 01/10/25 0440)  isosorbide  mononitrate (IMDUR ) 24 hr tablet 30 mg (30 mg Oral Given 01/10/25 0440)                                    Medical Decision Making Amount and/or Complexity of Data Reviewed Labs: ordered.  Risk Prescription drug management.  54 year old female with likely mild dehydration related to the vomiting.  Will treat symptomatically.  CT scans reassuring for any kind of acute intra-abdominal processes.  However labs are reassuring as well.  Improved symptoms, tolerating PO, resting well. Ready for d/c. Workup reassuring. Will d/c on supportive/symptomatic care. PCP follow up if not improving. Here if worsening.    Final diagnoses:  Nausea and vomiting, unspecified vomiting type    ED Discharge Orders          Ordered    pantoprazole  (PROTONIX ) 20 MG tablet  Daily        01/10/25 0519    prochlorperazine  (COMPAZINE ) 10 MG tablet  2 times daily PRN        01/10/25 0519    prochlorperazine  (COMPAZINE ) 25 MG suppository  Every 12 hours PRN        01/10/25 0519               Gentle Hoge, Selinda, MD 01/10/25 9476  "
